# Patient Record
Sex: Female | Born: 1970
Health system: Southern US, Community
[De-identification: ages and names within clinical notes are randomized; demographics above are authoritative.]

## PROBLEM LIST (undated history)

## (undated) DIAGNOSIS — M1990 Unspecified osteoarthritis, unspecified site: Secondary | ICD-10-CM

## (undated) DIAGNOSIS — K589 Irritable bowel syndrome without diarrhea: Secondary | ICD-10-CM

## (undated) DIAGNOSIS — K760 Fatty (change of) liver, not elsewhere classified: Secondary | ICD-10-CM

## (undated) DIAGNOSIS — I253 Aneurysm of heart: Secondary | ICD-10-CM

## (undated) DIAGNOSIS — R7303 Prediabetes: Secondary | ICD-10-CM

## (undated) DIAGNOSIS — I10 Essential (primary) hypertension: Secondary | ICD-10-CM

## (undated) DIAGNOSIS — M2550 Pain in unspecified joint: Secondary | ICD-10-CM

## (undated) DIAGNOSIS — R51 Headache: Secondary | ICD-10-CM

## (undated) DIAGNOSIS — E559 Vitamin D deficiency, unspecified: Secondary | ICD-10-CM

## (undated) DIAGNOSIS — F329 Major depressive disorder, single episode, unspecified: Secondary | ICD-10-CM

## (undated) DIAGNOSIS — K829 Disease of gallbladder, unspecified: Secondary | ICD-10-CM

## (undated) DIAGNOSIS — F419 Anxiety disorder, unspecified: Secondary | ICD-10-CM

## (undated) DIAGNOSIS — G9332 Myalgic encephalomyelitis/chronic fatigue syndrome: Secondary | ICD-10-CM

## (undated) DIAGNOSIS — R748 Abnormal levels of other serum enzymes: Secondary | ICD-10-CM

## (undated) DIAGNOSIS — G43909 Migraine, unspecified, not intractable, without status migrainosus: Secondary | ICD-10-CM

## (undated) DIAGNOSIS — K219 Gastro-esophageal reflux disease without esophagitis: Secondary | ICD-10-CM

## (undated) DIAGNOSIS — G40909 Epilepsy, unspecified, not intractable, without status epilepticus: Secondary | ICD-10-CM

## (undated) DIAGNOSIS — E739 Lactose intolerance, unspecified: Secondary | ICD-10-CM

## (undated) DIAGNOSIS — N289 Disorder of kidney and ureter, unspecified: Secondary | ICD-10-CM

## (undated) DIAGNOSIS — R569 Unspecified convulsions: Secondary | ICD-10-CM

## (undated) DIAGNOSIS — F429 Obsessive-compulsive disorder, unspecified: Secondary | ICD-10-CM

## (undated) DIAGNOSIS — S83209A Unspecified tear of unspecified meniscus, current injury, unspecified knee, initial encounter: Secondary | ICD-10-CM

## (undated) DIAGNOSIS — R011 Cardiac murmur, unspecified: Secondary | ICD-10-CM

## (undated) DIAGNOSIS — Z8739 Personal history of other diseases of the musculoskeletal system and connective tissue: Secondary | ICD-10-CM

## (undated) DIAGNOSIS — F32A Depression, unspecified: Secondary | ICD-10-CM

## (undated) DIAGNOSIS — K5790 Diverticulosis of intestine, part unspecified, without perforation or abscess without bleeding: Secondary | ICD-10-CM

## (undated) DIAGNOSIS — R0602 Shortness of breath: Secondary | ICD-10-CM

## (undated) DIAGNOSIS — R1310 Dysphagia, unspecified: Secondary | ICD-10-CM

## (undated) DIAGNOSIS — R7301 Impaired fasting glucose: Secondary | ICD-10-CM

## (undated) DIAGNOSIS — M797 Fibromyalgia: Secondary | ICD-10-CM

## (undated) DIAGNOSIS — M549 Dorsalgia, unspecified: Secondary | ICD-10-CM

## (undated) DIAGNOSIS — K5900 Constipation, unspecified: Secondary | ICD-10-CM

## (undated) HISTORY — DX: Abnormal levels of other serum enzymes: R74.8

## (undated) HISTORY — DX: Fatty (change of) liver, not elsewhere classified: K76.0

## (undated) HISTORY — DX: Unspecified convulsions: R56.9

## (undated) HISTORY — DX: Anxiety disorder, unspecified: F41.9

## (undated) HISTORY — DX: Irritable bowel syndrome, unspecified: K58.9

## (undated) HISTORY — DX: Fibromyalgia: M79.7

## (undated) HISTORY — DX: Cardiac murmur, unspecified: R01.1

## (undated) HISTORY — PX: CHOLECYSTECTOMY: SHX55

## (undated) HISTORY — DX: Headache: R51

## (undated) HISTORY — DX: Lactose intolerance, unspecified: E73.9

## (undated) HISTORY — DX: Impaired fasting glucose: R73.01

## (undated) HISTORY — PX: TUBAL LIGATION: SHX77

## (undated) HISTORY — PX: ABDOMINAL HYSTERECTOMY: SHX81

## (undated) HISTORY — DX: Disorder of kidney and ureter, unspecified: N28.9

## (undated) HISTORY — DX: Aneurysm of heart: I25.3

## (undated) HISTORY — DX: Major depressive disorder, single episode, unspecified: F32.9

## (undated) HISTORY — DX: Myalgic encephalomyelitis/chronic fatigue syndrome: G93.32

## (undated) HISTORY — DX: Constipation, unspecified: K59.00

## (undated) HISTORY — DX: Shortness of breath: R06.02

## (undated) HISTORY — DX: Disease of gallbladder, unspecified: K82.9

## (undated) HISTORY — DX: Essential (primary) hypertension: I10

## (undated) HISTORY — DX: Depression, unspecified: F32.A

## (undated) HISTORY — DX: Pain in unspecified joint: M25.50

## (undated) HISTORY — PX: OTHER SURGICAL HISTORY: SHX169

## (undated) HISTORY — DX: Dorsalgia, unspecified: M54.9

## (undated) HISTORY — DX: Dysphagia, unspecified: R13.10

## (undated) HISTORY — DX: Prediabetes: R73.03

## (undated) HISTORY — DX: Migraine, unspecified, not intractable, without status migrainosus: G43.909

## (undated) HISTORY — DX: Unspecified osteoarthritis, unspecified site: M19.90

## (undated) HISTORY — DX: Unspecified tear of unspecified meniscus, current injury, unspecified knee, initial encounter: S83.209A

## (undated) HISTORY — DX: Gastro-esophageal reflux disease without esophagitis: K21.9

## (undated) HISTORY — DX: Personal history of other diseases of the musculoskeletal system and connective tissue: Z87.39

## (undated) HISTORY — DX: Obsessive-compulsive disorder, unspecified: F42.9

## (undated) HISTORY — DX: Vitamin D deficiency, unspecified: E55.9

## (undated) HISTORY — DX: Diverticulosis of intestine, part unspecified, without perforation or abscess without bleeding: K57.90

## (undated) HISTORY — DX: Epilepsy, unspecified, not intractable, without status epilepticus: G40.909

---

## 2000-01-11 ENCOUNTER — Encounter: Admission: RE | Admit: 2000-01-11 | Discharge: 2000-01-11 | Payer: Self-pay | Admitting: Surgery

## 2000-01-11 ENCOUNTER — Encounter: Payer: Self-pay | Admitting: Surgery

## 2000-02-05 ENCOUNTER — Encounter: Payer: Self-pay | Admitting: Surgery

## 2000-02-05 ENCOUNTER — Encounter (INDEPENDENT_AMBULATORY_CARE_PROVIDER_SITE_OTHER): Payer: Self-pay | Admitting: Specialist

## 2000-02-05 ENCOUNTER — Observation Stay (HOSPITAL_COMMUNITY): Admission: RE | Admit: 2000-02-05 | Discharge: 2000-02-06 | Payer: Self-pay | Admitting: Surgery

## 2001-07-27 ENCOUNTER — Other Ambulatory Visit: Admission: RE | Admit: 2001-07-27 | Discharge: 2001-07-27 | Payer: Self-pay | Admitting: Obstetrics and Gynecology

## 2002-01-29 ENCOUNTER — Ambulatory Visit (HOSPITAL_COMMUNITY): Admission: RE | Admit: 2002-01-29 | Discharge: 2002-01-29 | Payer: Self-pay | Admitting: Obstetrics and Gynecology

## 2002-02-12 ENCOUNTER — Inpatient Hospital Stay (HOSPITAL_COMMUNITY): Admission: RE | Admit: 2002-02-12 | Discharge: 2002-02-14 | Payer: Self-pay | Admitting: Obstetrics and Gynecology

## 2002-02-12 ENCOUNTER — Ambulatory Visit (HOSPITAL_COMMUNITY): Admission: AD | Admit: 2002-02-12 | Discharge: 2002-02-12 | Payer: Self-pay | Admitting: Obstetrics & Gynecology

## 2002-03-26 ENCOUNTER — Ambulatory Visit (HOSPITAL_COMMUNITY): Admission: RE | Admit: 2002-03-26 | Discharge: 2002-03-26 | Payer: Self-pay | Admitting: Obstetrics and Gynecology

## 2003-01-28 ENCOUNTER — Ambulatory Visit (HOSPITAL_COMMUNITY): Admission: RE | Admit: 2003-01-28 | Discharge: 2003-01-28 | Payer: Self-pay | Admitting: Family Medicine

## 2003-01-28 ENCOUNTER — Encounter: Payer: Self-pay | Admitting: Family Medicine

## 2004-06-17 ENCOUNTER — Inpatient Hospital Stay (HOSPITAL_COMMUNITY): Admission: RE | Admit: 2004-06-17 | Discharge: 2004-06-19 | Payer: Self-pay | Admitting: Obstetrics & Gynecology

## 2005-01-06 ENCOUNTER — Encounter: Admission: RE | Admit: 2005-01-06 | Discharge: 2005-01-06 | Payer: Self-pay | Admitting: Obstetrics & Gynecology

## 2005-01-20 ENCOUNTER — Encounter: Admission: RE | Admit: 2005-01-20 | Discharge: 2005-01-20 | Payer: Self-pay | Admitting: Obstetrics & Gynecology

## 2005-01-21 ENCOUNTER — Encounter: Admission: RE | Admit: 2005-01-21 | Discharge: 2005-01-21 | Payer: Self-pay | Admitting: Obstetrics & Gynecology

## 2005-02-17 ENCOUNTER — Encounter: Payer: Self-pay | Admitting: Obstetrics & Gynecology

## 2005-02-17 ENCOUNTER — Ambulatory Visit (HOSPITAL_COMMUNITY): Admission: RE | Admit: 2005-02-17 | Discharge: 2005-02-17 | Payer: Self-pay | Admitting: Obstetrics & Gynecology

## 2006-07-28 ENCOUNTER — Observation Stay (HOSPITAL_COMMUNITY): Admission: AD | Admit: 2006-07-28 | Discharge: 2006-07-29 | Payer: Self-pay | Admitting: Family Medicine

## 2008-11-05 ENCOUNTER — Encounter (INDEPENDENT_AMBULATORY_CARE_PROVIDER_SITE_OTHER): Payer: Self-pay | Admitting: *Deleted

## 2008-11-06 ENCOUNTER — Ambulatory Visit: Payer: Self-pay | Admitting: Genetic Counselor

## 2008-12-11 ENCOUNTER — Encounter (INDEPENDENT_AMBULATORY_CARE_PROVIDER_SITE_OTHER): Payer: Self-pay | Admitting: *Deleted

## 2009-08-05 ENCOUNTER — Ambulatory Visit (HOSPITAL_COMMUNITY): Admission: RE | Admit: 2009-08-05 | Discharge: 2009-08-06 | Payer: Self-pay | Admitting: Urology

## 2009-08-11 ENCOUNTER — Ambulatory Visit (HOSPITAL_COMMUNITY): Admission: RE | Admit: 2009-08-11 | Discharge: 2009-08-11 | Payer: Self-pay | Admitting: Urology

## 2009-09-26 ENCOUNTER — Ambulatory Visit (HOSPITAL_COMMUNITY): Admission: RE | Admit: 2009-09-26 | Discharge: 2009-09-27 | Payer: Self-pay | Admitting: Urology

## 2010-05-24 ENCOUNTER — Encounter: Payer: Self-pay | Admitting: Family Medicine

## 2010-05-24 ENCOUNTER — Encounter: Payer: Self-pay | Admitting: Obstetrics & Gynecology

## 2010-05-25 ENCOUNTER — Encounter: Payer: Self-pay | Admitting: Obstetrics & Gynecology

## 2010-06-04 NOTE — Letter (Signed)
Summary: Generic Letter, Intro to Referring  [facility]  [address]   [place], [place] [address]   Phone: [phone]  Fax: [phone]      December 11, 2008             RE: Theresa [name]   August 09, 1970                 [address]                 [place], [place]  [address]  Dear Appt Secretary,   This patient was a no show today.          Sincerely,    Elinor Parkinson  [facility] Ph: [phone]   Fax: [phone]

## 2010-06-04 NOTE — Letter (Signed)
Summary: Generic Letter, Intro to Referring  [facility]  [address]   [place], [place] [address]   Phone: [phone]  Fax: [phone]      November 05, 2008             RE: Theresa [name]   11-Jan-1971                 [address]                 [place], [place]  [address]  Dear Enid Derry,    This pt has been scheduled an appt for 12/11/2008 @ 9:00 w/ Dr. Jena Gauss.   Lmom with appt info and for a return call.         Sincerely,    Elinor Parkinson  [facility] Ph: [phone]   Fax: [phone]

## 2010-07-20 LAB — TYPE AND SCREEN
ABO/RH(D): O POS
Antibody Screen: NEGATIVE

## 2010-07-20 LAB — HEMOGLOBIN AND HEMATOCRIT, BLOOD
HCT: 32.1 % — ABNORMAL LOW (ref 36.0–46.0)
HCT: 33.8 % — ABNORMAL LOW (ref 36.0–46.0)
Hemoglobin: 10.6 g/dL — ABNORMAL LOW (ref 12.0–15.0)
Hemoglobin: 11.3 g/dL — ABNORMAL LOW (ref 12.0–15.0)

## 2010-07-20 LAB — PROTIME-INR
INR: 1.02 (ref 0.00–1.49)
Prothrombin Time: 13.3 seconds (ref 11.6–15.2)

## 2010-07-20 LAB — CBC
HCT: 36.9 % (ref 36.0–46.0)
Hemoglobin: 12.5 g/dL (ref 12.0–15.0)
MCHC: 33.9 g/dL (ref 30.0–36.0)
MCV: 93.5 fL (ref 78.0–100.0)
Platelets: 223 10*3/uL (ref 150–400)
RBC: 3.95 MIL/uL (ref 3.87–5.11)
RDW: 13.2 % (ref 11.5–15.5)
WBC: 5.4 10*3/uL (ref 4.0–10.5)

## 2010-07-20 LAB — APTT: aPTT: 33 s (ref 24–37)

## 2010-07-22 LAB — BASIC METABOLIC PANEL
BUN: 10 mg/dL (ref 6–23)
CO2: 26 mEq/L (ref 19–32)
Calcium: 8.5 mg/dL (ref 8.4–10.5)
Chloride: 110 mEq/L (ref 96–112)
Creatinine, Ser: 0.79 mg/dL (ref 0.4–1.2)
GFR calc Af Amer: >60 mL/min (ref >60)
GFR calc non Af Amer: >60 mL/min (ref >60)
Glucose, Bld: 112 mg/dL — ABNORMAL HIGH (ref 70–99)
Potassium: 4 mEq/L (ref 3.5–5.1)
Sodium: 140 mEq/L (ref 135–145)

## 2010-07-22 LAB — PROTIME-INR
INR: 1.01 (ref 0.00–1.49)
Prothrombin Time: 13.2 seconds (ref 11.6–15.2)

## 2010-07-22 LAB — APTT: aPTT: 33 seconds (ref 24–37)

## 2010-07-22 LAB — TYPE AND SCREEN
ABO/RH(D): O POS
Antibody Screen: NEGATIVE

## 2010-07-22 LAB — HEMOGLOBIN AND HEMATOCRIT, BLOOD
HCT: 32 % — ABNORMAL LOW (ref 36.0–46.0)
HCT: 32 % — ABNORMAL LOW (ref 36.0–46.0)
Hemoglobin: 10.6 g/dL — ABNORMAL LOW (ref 12.0–15.0)
Hemoglobin: 10.9 g/dL — ABNORMAL LOW (ref 12.0–15.0)

## 2010-07-22 LAB — CBC
HCT: 35.3 % — ABNORMAL LOW (ref 36.0–46.0)
Hemoglobin: 12 g/dL (ref 12.0–15.0)
MCHC: 33.9 g/dL (ref 30.0–36.0)
MCV: 93.1 fL (ref 78.0–100.0)
Platelets: 217 10*3/uL (ref 150–400)
RBC: 3.79 MIL/uL — ABNORMAL LOW (ref 3.87–5.11)
RDW: 13.1 % (ref 11.5–15.5)
WBC: 4.2 10*3/uL (ref 4.0–10.5)

## 2010-07-22 LAB — ABO/RH: ABO/RH(D): O POS

## 2010-07-22 LAB — GLUCOSE, CAPILLARY: Glucose-Capillary: 122 mg/dL — ABNORMAL HIGH (ref 70–99)

## 2010-09-11 ENCOUNTER — Other Ambulatory Visit: Payer: Self-pay | Admitting: Family Medicine

## 2010-09-11 DIAGNOSIS — Z09 Encounter for follow-up examination after completed treatment for conditions other than malignant neoplasm: Secondary | ICD-10-CM

## 2010-09-18 NOTE — Op Note (Signed)
Theresa [name], Theresa [name]               ACCOUNT NO.:  [id number]   MEDICAL RECORD NO.:  [id number]          PATIENT TYPE:  AMB   LOCATION:  DAY                           FACILITY:  [facility]   PHYSICIAN:  Lazaro Arms, M.D.   DATE OF BIRTH:  [birth year]   DATE OF PROCEDURE:  06/17/2004  DATE OF DISCHARGE:                                 OPERATIVE REPORT   PREOPERATIVE DIAGNOSES:  1.  Menometrorrhagia.  2.  Dysmenorrhea.  3.  Dyspareunia.  4.  Genuine stress urinary incontinence.   POSTOPERATIVE DIAGNOSES:  1.  Menometrorrhagia.  2.  Dysmenorrhea.  3.  Dyspareunia.  4.  Genuine stress urinary incontinence.   PROCEDURES:  1.  Vaginal hysterectomy with removal of both fallopian tubes.  2.  Retropubic midurethral sling placement.   SURGEON:  Lazaro Arms, M.D.   ANESTHESIA:  General endotracheal.   FINDINGS:  The patient had what appeared to be a normal uterus.  It was a  bit boggy, probably had adenomyosis.  The ovaries were both normal.  The  bladder was normal on cystoscopy.   DESCRIPTION OF OPERATION:  The patient was taken to the operating room and  placed in the supine position, where she underwent general endotracheal  anesthesia.  She was placed in dorsal lithotomy position in candy cane  stirrups.  Per patient's request, her clitoral ring was removed at the time  of surgery.  She then underwent routine prep and drape in the usual sterile  fashion for vaginal surgery.  A weighted speculum was placed.  The cervix  was grasped, a Foley catheter was placed for bladder drainage.  Marcaine  0.5% was injected circumferentially about the cervix.  An electrocautery  unit was used and an incision was made.  The anterior and posterior vagina  were pushed off the lower uterine segment without difficulty and the  anterior uterine segment and the posterior cul-de-sac was entered sharply  without difficulty.  The uterosacral ligaments were clamped, cut, and suture  ligated bilaterally.   The cardinal ligaments were clamped, cut, and suture  ligated bilaterally.  The anterior vagina was pushed further off the lower  uterine segment and the anterior cul-de-sac was entered.  The anterior  leaves of the broad ligament were plicated to the posterior leaves, and the  uterine vessels were clamped, cut, and suture ligated.  Serial pedicles were  then taken up the fundus, each pedicle being clamped, cut, and suture  ligated.  The utero-ovarian ligaments were crossclamped, double suture  ligated.  The ovaries were normal, and they were maintained.  The fallopian  tubes were removed, where they were clamped, cut, and single suture ligated  with good hemostasis.  The pelvis was irrigated vigorously, all pedicles  found to be hemostatic.  The anterior vagina, anterior peritoneum, posterior  peritoneum, and posterior vagina were closed anteriorly to posteriorly in  interrupted fashion from side to side for vaginal closure.  A McCall  culdoplasty suture was placed for additional vaginal support.  The anterior  vagina was then grasped with the bladder neck out to the end of  the urethra.  Marcaine 0.5% with 1:200,000 epinephrine was injected as a local anesthetic  and also for plane development and hemostasis.  Incision was made.  The  vagina was dissected off the urethra down to the bladder neck and also back  to the pubis.  Two incisions were made on either side of the midline 2 cm  from the midline at the pubis.  The Pelvilace trocar system was then passed  on either side without difficulty.  Cystoscopy was performed and revealed  that there was no perforation of the bladder with either side.  The  Pelvilace system was then pulled back through and adjusted so that it was  not at the level of the bladder neck, it was midurethral, and it was not at  rest, resting on the urethra at all.  It was literally forming a hammock but  not impinging the urethra.  There was good hemostasis, and it was  cut at the  skin level.  The incisions were closed with Dermabond.  The vagina was  closed with interrupted 0 Vicryl sutures.  There was good hemostasis.  The  vagina was repacked and, per the patient request, her clitoral ring was  replaced without difficulty.  The patient was awakened from anesthesia,  taken to the recovery room in good, stable condition.  All counts correct.  She experienced 200 mL of blood loss.  She received 1 g of Ancef  prophylactically.  All specimens went to the lab.      LHE/MEDQ  D:  06/17/2004  T:  06/17/2004  Job:  [id number]

## 2010-09-18 NOTE — H&P (Signed)
NAME:  Theresa [name], Theresa [name]                         ACCOUNT NO.:  [id number]   MEDICAL RECORD NO.:  [id number]                   PATIENT TYPE:  INP   LOCATION:  A416                                 FACILITY:  [facility]   PHYSICIAN:  Lazaro Arms, M.D.                DATE OF BIRTH:  Sep 05, 1970   DATE OF ADMISSION:  02/12/2002  DATE OF DISCHARGE:                                HISTORY & PHYSICAL   HISTORY OF PRESENT ILLNESS:  The patient is a 40 year old white female,  gravida 2, para 1, aborta 0, with an estimated date of delivery of February 19, 2002, currently 39 weeks' gestation.  The patient had a small fundal  height and underwent a sonogram today which was 2678 g, 5.9 pounds, which  places her at the 10th percentile for this gestation.  Amniotic fluid was  good.  She has a history of a gross retarded child and the patient is also  really concerned because she had a nuchal cord last pregnancy.  She has also  been complaining of diminished fetal movement.  We sent her over for an MST  and that was reactive.  Her cervix is very unfavorable.  It is long, thick,  and closed.  As a result she is admitted for cervical ripening with Cervidil  followed by Pitocin induction tomorrow morning.   PAST MEDICAL HISTORY:  Reflux.   PAST SURGICAL HISTORY:  Gallbladder.   PAST OB HISTORY:  She had a vaginal delivery in 1997, 5 pounds 14 ounces, at  41 weeks' gestation.   ALLERGIES:  None.   MEDICATIONS:  1. Prenatal vitamins.  2. Zofran.  3. Prevacid.   LABORATORY DATA:  Blood type is O positive.  Antibody screen is negative.  Serologies nonreactive.  Rubella is immune.  Hepatitis B surface antigen is  negative.  HIV is nonreactive.  Her Pap was normal.  GC and Chlamydia were  normal.  AFT was normal.  Her Glucola was 133, but she had a normal three  hour GTT.  She has also been anemic this pregnancy and her H&H was 9 and 27,  and she has had a couple UTIs and has been treated with  Macrobid.   PHYSICAL EXAMINATION:  HEENT:  Unremarkable.  NECK:  Thyroid is normal.  LUNGS:  Clear.  HEART:  Regular rhythm, no murmur, rubs, or gallop.  BREASTS:  Deferred.  ABDOMEN:  Fundal height is 36 cm.  PELVIC:  Cervix long, thick, and closed.  EXTREMITIES:  Warm with trace edema.  NEUROLOGIC:  Exam is grossly intact.   IMPRESSION:  1. Intrauterine pregnancy at 59 weeks' gestation.  2. Small for gestational age at 23th percentile.  3. History of small for gestational age.   PLAN:  The patient is admitted for cervical ripening followed by Pitocin  induction.  She understands the risks, benefits, indications, and  alternatives.  Will proceed.  Lazaro Arms, M.D.    Theresa [name]  D:  02/12/2002  T:  02/12/2002  Job:  [id number]

## 2010-09-18 NOTE — H&P (Signed)
   NAMEPATIENCE, NUZZO NO.:  [id number]   MEDICAL RECORD NO.:  [id number]                  PATIENT TYPE:   LOCATION:                                       FACILITY:   PHYSICIAN:  Tilda Burrow, M.D.              DATE OF BIRTH:   DATE OF ADMISSION:  03/22/2002  DATE OF DISCHARGE:                                HISTORY & PHYSICAL   ADMISSION DIAGNOSIS:  Desire for elective sterilization.   HISTORY OF PRESENT ILLNESS:  This 40 year old female, gravida 2, para 2, now  status post vaginal delivery on 02/13/02, was admitted for elective  permanent sterilization five weeks after recent vaginal delivery.  The  patient has remained abstinent since delivery, has had Krane's instruction  booklet reviewed with specific review of technical aspects of Falope ring  application, with complications, alternatives, and technical aspects of the  procedure reviewed using this Krane's booklet as a visual guide.  Questions  were encouraged and answered to the patient's satisfaction.  Failure rate of  one in 100 is specifically reviewed with the patient.   PAST MEDICAL HISTORY:  Positive for reflux (GERD).   PAST SURGICAL HISTORY:  Laparoscopic cholecystectomy.   ALLERGIES:  None.   MEDICATIONS:  Prevacid 10-20 mg q.d.   SOCIAL HISTORY:  Married, Lawyer.   FAMILY HISTORY:  Positive for ovarian and lung cancer, diabetes,  hypertension.   REVIEW OF SYSTEMS:  Prior anesthesia with gallbladder surgery is listed as  uncomplicated.   PHYSICAL EXAMINATION:  VITAL SIGNS:  Height 5 feet 6 inches, weight 172,  blood pressure 120/75.  Urinalysis negative.  GENERAL:  A healthy Caucasian female, alert, active, oriented x3.  HEENT:  Pupils equal, round, and reactive.  NECK:  Supple.  CHEST:  Clear to auscultation.  Heart and lung exam unremarkable.  ABDOMEN:  Nontender, well-healed laparoscopic cholecystectomy scars.  PELVIC:  External genitalia normal  female.  Vaginal exam:  Physiologic  secretions.  Cervix:  Nonpurulent, normal size, shape, and contour.  Midline  uterus, mobile, anterior, well-supported.  Adnexa negative for masses.    ASSESSMENT:  Desire for elective sterilization.   PLAN:  Laparoscopic tubal sterilization with Falope rings 03/22/02 at 7:30  a.m. at [facility]                                               Tilda Burrow, M.D.    JVF/MEDQ  D:  03/14/2002  T:  03/14/2002  Job:  [id number]

## 2010-09-18 NOTE — Group Therapy Note (Signed)
[name], Theresa [name]               ACCOUNT NO.:  [id number]   MEDICAL RECORD NO.:  [id number]          PATIENT TYPE:  OBV   LOCATION:  A203                          FACILITY:  [facility]   PHYSICIAN:  Scott A. Gerda Diss, MD    DATE OF BIRTH:  11-22-1970   DATE OF PROCEDURE:  DATE OF DISCHARGE:                                 PROGRESS NOTE   She threw up this morning. Peed once. No other vomiting or diarrhea. No  fevers. Lungs clear. Heart is regular. Abdomen soft. A/P polynephritis  with gastroenteritis symptoms doing better, but still needs to stay  until this evening. Possibility may have to stay beyond that. Wait and  see.      Scott A. Gerda Diss, MD  Electronically Signed     SAL/MEDQ  D:  07/29/2006  T:  07/29/2006  Job:  [id number]

## 2010-09-18 NOTE — Op Note (Signed)
   NAME:  Theresa [name], Theresa [name]                         ACCOUNT NO.:  [id number]   MEDICAL RECORD NO.:  [id number]                   PATIENT TYPE:  AMB   LOCATION:  DAY                                  FACILITY:  [facility]   PHYSICIAN:  Tilda Burrow, M.D.              DATE OF BIRTH:  1970/12/10   DATE OF PROCEDURE:  DATE OF DISCHARGE:                                 OPERATIVE REPORT   PREOPERATIVE DIAGNOSES:  Elective sterilization.   POSTOPERATIVE DIAGNOSES:  Elective sterilization.   PROCEDURE:  Laparoscopic tubal sterilization with Falope ring.   SURGEON:  Tilda Burrow, M.D.   ASSISTANT:  _____________ CST   ANESTHESIA:  Orson Gear, CRNA.   COMPLICATIONS:  None.   FINDINGS:  Extremely mobile pelvic structures, relaxed perineal body. If  gynecologic surgery is ever anticipated she would be an excellent candidate  for a vaginal hysterectomy and/or posterior repair. There are no  intraabdominal adhesions noted.   DESCRIPTION OF PROCEDURE:  The patient was taken to the operating room,  prepped and draped in the usual fashion for a combined abdominal and vaginal  procedure. In and out catheterization removed 400 cc of urine, Hulka  tenaculum was attached to the cervix and attention directed to the abdomen  where an infraumbilical vertical 1 cm skin incision was performed. The  Veress needle was introduced, pneumoperitoneum achieved under 8 mm pressure,  a 5 mm laparoscopic trocar introduced and visualization of the pelvic  structures easily performed. The uterus was very mobile and could be  manipulated from descending to within 2 cm of the introitus to very high in  the pelvis. Ovaries were grossly normal without adhesions. There was no  evidence of endometriosis. The bladder was normal without adhesions.   Attention was directed to the suprapubic area where an 8 mm trocar was  introduced, all my attention direct to the left tube which was grasped with  a Falope ring and  pliers, the ring applied, infiltration of the mesosalpinx  and the incarcerated loops of tube with dilute Marcaine solution 0.25%. We  then proceeded to do an identical procedure on the opposite side.   Instilling 100 cc of saline was followed by deflation of the abdomen,  subcuticular 4-0 Dexon closure of skin incisions and removal of the Hulka  tenaculum. The patient tolerated the procedure well and went to the recovery  room after slow tendency to wake up.                                                Tilda Burrow, M.D.    JVF/MEDQ  D:  03/26/2002  T:  03/26/2002  Job:  [id number]

## 2010-09-18 NOTE — Op Note (Signed)
   NAME:  Theresa [name], Theresa [name]                         ACCOUNT NO.:  [id number]   MEDICAL RECORD NO.:  [id number]                   PATIENT TYPE:  INP   LOCATION:  A416                                 FACILITY:  [facility]   PHYSICIAN:  Tilda Burrow, M.D.              DATE OF BIRTH:  05/17/1970   DATE OF PROCEDURE:  02/13/2002  DATE OF DISCHARGE:                                  PROCEDURE NOTE   ONSET OF LABOR:  February 13, 2002 at 12 a.m.   DATE OF DELIVERY:  February 13, 2002 at 4:10 a.m.   LENGTH OF FIRST STAGE LABOR:  Three hours and 45 minutes.   LENGTH OF SECOND STAGE LABOR:  Twenty-five minutes.   LENGTH OF THIRD STAGE LABOR:  Five minutes.   DELIVERY NOTE:  The patient had a normal spontaneous vaginal delivery of a  viable female infant weighing 5 pounds 8 ounces, with Apgars of 9/9.  Upon  delivery of head, spontaneous delivery of infant without any complications.  Infant had a strong cry, good tone, pinked up very well.  Cord was clamped  and cut and infant dried and placed on the mom's abdomen in good condition.  A 1 degree perineal laceration was noted which was infiltrated with some 1%  lidocaine, 10 cc, and two interrupted sutures to close of 2-0 Vicryl.  Third  stage of labor was actively managed with 20 units of Pitocin, 1000 cc of D-5  LR.  Placenta was delivered spontaneously with membranes intact.  Three-  vessel cord was noted.  Estimated blood loss was approximately 300 cc.  Infant and mother were stabilized, tolerated procedure well and transferred  out to the postpartum unit in stable condition.     Zerita Boers, Reita Cliche, M.D.    DL/MEDQ  D:  16/02/9603  T:  02/13/2002  Job:  [id number]   cc:   [facility]

## 2010-09-18 NOTE — Op Note (Signed)
[facility]  Patient:    Theresa [name], Theresa [name]                        MRN: [id number] Proc. Date: 02/05/00 Attending:  [place]. Daphine Deutscher, M.D. CC:         Lilyan Punt, M.D., [place], [place].   Operative Report  PREOPERATIVE DIAGNOSES:  Chronic cholecystitis with gallstones.  POSTOPERATIVE DIAGNOSES:  Chronic cholecystitis with gallstones.  PROCEDURE:  Laparoscopic cholecystectomy with intraoperative cholangiogram.  SURGEON:  Luretha Murphy, M.D.  ASSISTANT:  Jaclynn Guarneri, M.D.  ANESTHESIA:  General endotracheal.  DESCRIPTION OF PROCEDURE:  Ms. Ozturk was taken to OR 4 and given general anesthesia. The abdomen was prepped with Betadine and draped sterilely. A longitudinal incision was made down into her umbilicus through which I found a small little hernia and then opened that up to create a defect which I subsequently placed a pursestring suture around for the Hasson cannula. The Hasson was introduced without difficulty and the abdomen was insufflated. With insufflation, I put 3 trocars in the upper abdomen and grasped the gallbladder and elevated it. There were adhesions to the fundus and infundibulum of the gallbladder which were easily stripped away. I dissected free Calots triangle and clipped proximally on the cystic duct and also will put 1 clip on the cystic artery. I did a dynamic cholangiogram with the C-arm and this showed good intrahepatic filling and flow up into the liver as well as prompt flow into the duodenum. No filling defects were noted.  I then triple clipped the cystic duct and divided it and triple clipped the cystic artery and divided it and removed the gallbladder without difficulty from the gallbladder bed using the hook coagulator. Once detached, I went back and inspected the area after I had subjected it to zero pressure and no bleeding or bile leaks were seen. The gallbladder was placed in a bag and brought out through the  umbilicus. The umbilical port was tied down and under direct vision securely closed. The port sites were not bleeding. The irrigation that had been used was withdrawn. The port sites were injected with Marcaine and the abdomen was deflated. The wounds were then closed with 4-0 Vicryl with benzoin and Steri-Strips. The patient tolerated the procedure well and was taken to the recovery room in satisfactory condition. DD:  02/05/00 TD:  02/05/00 Job: [id number]

## 2010-09-18 NOTE — Op Note (Signed)
Theresa [name], Theresa [name]               ACCOUNT NO.:  [id number]   MEDICAL RECORD NO.:  [id number]          PATIENT TYPE:  AMB   LOCATION:  DAY                           FACILITY:  [facility]   PHYSICIAN:  Lazaro Arms, M.D.   DATE OF BIRTH:  04-16-1971   DATE OF PROCEDURE:  02/17/2005  DATE OF DISCHARGE:                                 OPERATIVE REPORT   PREOPERATIVE DIAGNOSES:  1.  Left lower quadrant pain.  2.  Left sided dyspareunia.   POSTOPERATIVE DIAGNOSES:  1.  Left lower quadrant pain.  2.  Left sided dyspareunia.   OPERATION PERFORMED:  Laparoscopic left salpingo-oophorectomy.   SURGEON:  Lazaro Arms, M.D.   ANESTHESIA:  General endotracheal.   FINDINGS:  The patient had adherent left ovary to the left pelvic side wall  and filmy adhesions to the sigmoid epiploicae.  The right ovary and tube  were completely normal.  There was a small adhesion to a epiploica of the  appendix which was taken down.  The rest of the peritoneal cavity was  normal. There was no endometriosis, adhesive disease or evidence of  infection.   DESCRIPTION OF PROCEDURE:  The patient was taken to the operating room and  placed in supine position where she underwent general endotracheal  anesthesia.  She was placed in dorsal lithotomy position, prepped and draped  in the usual sterile fashion for laparoscopic surgery.  Incision was made in  the umbilicus.  A disposable Veress needle was placed in the peritoneal  cavity in one pass without difficulty.  The peritoneal cavity was  insufflated to 15.  The patient was placed in Trendelenburg position.  Incision was made two fingerbreadths above the pubis in the midline.  A 5 mm  nonbladed trocar was placed.  The incision was then made in the left lower  quadrant.  Another 5 mm trocar was placed under direct visualization with  video laparoscope.  The left ovary was grasped.  Harmonic scalpel was used  and the infundibulopelvic ligament was taken down  without difficulty.  The  filmy adhesions to the epiploicae were also taken down.  There was good  hemostasis.  Pelvis irrigated vigorously and again hemostasis was confirmed.  The ovary was removed with EndoCatch bag without difficulty.  The umbilical  fascia was closed using 0 Vicryl, subcutaneous tissue was made hemostatic  and closed in the umbilicus  using 3-0 Vicryl.  All three skin incisions were closed using Dermabond.  The patient was awakened from anesthesia, taken to recovery room in good  stable condition.  She received Ancef and Toradol prophylactically.  The  specimen will go to pathology for routine evaluation.      Lazaro Arms, M.D.  Electronically Signed     LHE/MEDQ  D:  02/17/2005  T:  02/17/2005  Job:  [id number]

## 2010-09-18 NOTE — H&P (Signed)
NAMEICIE, KUZNICKI               ACCOUNT NO.:  [id number]   MEDICAL RECORD NO.:  [id number]          PATIENT TYPE:  AMB   LOCATION:  DAY                           FACILITY:  [facility]   PHYSICIAN:  Lazaro Arms, M.D.   DATE OF BIRTH:  1970-11-08   DATE OF ADMISSION:  DATE OF DISCHARGE:  LH                                HISTORY & PHYSICAL   HISTORY OF PRESENT ILLNESS:  Reagen is a 40 year old white female, gravida  3, para 2, abortus 1, status post a tubal ligation in 2003, who suffered  with a heavy, prolonged vaginal bleeding with her periods for some time.  She also has painful intercourse in the midline.  Her periods are also quite  painful.  She has large clots.  We have had to use Megace with her for  suppression, and she did not do well with that. We also offered possible  Jearld Adjutant IUD, and she was not comfortable with that as a mode of management.  As a result, we are discussing the options.  Ablation would certainly not  treat her significant dyspareunia which she has every time with intercourse,  and she also has genuine stress urinary incontinence. She does not have any  urge symptoms, no urge incontinence.  She also has urethrocele. As a result,  we will proceed with a vaginal hysterectomy with a retropubic mid urethral  sling placement.   PAST MEDICAL HISTORY:  Gastroesophageal reflux,   PAST SURGICAL HISTORY:  1.  Laparoscopic cholecystectomy.  2.  Laparoscopic tubal ligation.   PAST OBSTETRICAL HISTORY:  Two vaginal deliveries.   ALLERGIES:  None.   CURRENT MEDICATIONS:  Tylox for her pelvic pain.  She is no longer taking  the Megace.   REVIEW OF SYSTEMS:  Otherwise negative.   PHYSICAL EXAMINATION:  VITAL SIGNS:  Weight 192 pounds.  Blood pressure  110/80.  HEENT:  Unremarkable.  NECK:  Thyroid is normal.  LUNGS:  Clear.  HEART:  Regular rate and rhythm without murmur, regurgitation, or gallop.  BREASTS:  Without mass, discharge, skin changes.  There is no  axillary  adenopathy.  No nipple changes.  ABDOMEN: Benign without hepatosplenomegaly or masses.  PELVIC:  She has normal external genitalia.  The vagina is pink, moist, no  discharge.  Cervix is parous with no lesions.  The uterus is mid plane,  tender to palpation.  Adnexa negative.  She does have a urethrocele and mild  rectocele but certainly not worthy of repair.  EXTREMITIES:  Warm with no edema.  NEUROLOGIC:  Grossly intact.   IMPRESSION:  1.  Menometrorrhagia.  2.  Dysmenorrhea.  3.  Dyspareunia.  4.  Genuine stress urinary incontinence with urethrocele.   PLAN:  The patient is admitted for vaginal hysterectomy and retropubic mid  urethral sling. Vonda and I talked over the options in detail.  We also  discussed the fact that she may need to have a catheterization for some time  after surgery.  Because of her age, I will probably put her sling maybe a  bit tighter than others.  We will  certainly try to walk the line of good  anatomical repair, keeping her continent, but with her age, we want to try  to make sure it lasts for a lifetime.  The patient understands this and  agrees with this management plan, and we will proceed.      LHE/MEDQ  D:  06/16/2004  T:  06/16/2004  Job:  [id number]

## 2010-09-18 NOTE — H&P (Signed)
Theresa [name], [name]               ACCOUNT NO.:  [id number]   MEDICAL RECORD NO.:  [id number]          PATIENT TYPE:  OBV   LOCATION:  A203                          FACILITY:  [facility]   PHYSICIAN:  Scott A. Gerda Diss, MD    DATE OF BIRTH:  1971/03/26   DATE OF ADMISSION:  07/28/2006  DATE OF DISCHARGE:  LH                              HISTORY & PHYSICAL   CHIEF COMPLAINT:  Vomiting, abdominal pain.   HISTORY OF PRESENT ILLNESS:  This is a 40 year old white female who  presents with a four day history of a little bit of cough, some fever,  painful urination, vomiting, lower back pain, and flank pain. She was  seen originally on March 26. At that time her temperature was 100.9. Her  weight was 197. Orthostatics were done and did have a drop in blood  pressure, but heart rate did not go up. The patient did not want to go  into the hospital at that time and thought it was reasonable to try to  treat her as an outpatient. A shot of Rocephin was given along with  Phenergan and Stadol, and she was allowed to go home. On the 27th she  follows up and dropped about 3.5 pounds of weight. Temperature was  normal. Slight change with orthostatics. In her urine showed 3+ ketones.  On physical exam yesterday she had flank pain and discomfort so  therefore it is felt that the patient should go ahead and be admitted  into observation. PMH, impaired fasting glucose, history of kidney  infection, and also hysterectomy in the past.   FAMILY HISTORY:  Breast cancer, hypertension, diabetes.   SOCIAL HISTORY:  Married. Has a child. Does not smoke or drink.   ALLERGIES:  None.   REVIEW OF SYSTEMS:  See per above.   PHYSICAL EXAMINATION:  GEN:  NAD.  TMs __________.  MM moist.  NECK:  Supple.  LUNGS:  Clear.  HEART:  Regular.  ABDOMEN:  Soft. Patient nauseated. Flank, mild tenderness on the right  side.  EXTREMITIES:  No edema.  SKIN:  Warm, dry.   LABORATORY DATA:  White count 11.2, neutrophils 79,  sodium 135,  potassium 4, glucose 115, BUN 9, creatinine 0.86. Urine in the office  showed 3+ ketones.   Patient's blood pressure went from 92/54 laying to 82/48 standing. The  patient felt clinically dizzy with S/P pyelonephritis with some  gastroenteritis and low blood pressure. I feel the patient should go  ahead and be treated with IV fluids, IV Rocephin, and monitor closely.  Advance diet gradually. Possibility the patient may have to stay longer  than 24 hours, but hold on this until further aspects.      Scott A. Gerda Diss, MD  Electronically Signed     SAL/MEDQ  D:  07/29/2006  T:  07/29/2006  Job:  [id number]

## 2010-09-23 ENCOUNTER — Ambulatory Visit (HOSPITAL_COMMUNITY)
Admission: RE | Admit: 2010-09-23 | Discharge: 2010-09-23 | Disposition: A | Discharge status: Home / Self Care | Payer: 59 | Source: Ambulatory Visit | Attending: Family Medicine | Admitting: Family Medicine

## 2010-09-23 DIAGNOSIS — N6459 Other signs and symptoms in breast: Secondary | ICD-10-CM | POA: Insufficient documentation

## 2010-09-23 DIAGNOSIS — Z09 Encounter for follow-up examination after completed treatment for conditions other than malignant neoplasm: Secondary | ICD-10-CM

## 2011-09-09 ENCOUNTER — Ambulatory Visit (HOSPITAL_COMMUNITY)
Admission: RE | Admit: 2011-09-09 | Discharge: 2011-09-09 | Disposition: A | Discharge status: Home / Self Care | Payer: 59 | Source: Ambulatory Visit | Attending: Family Medicine | Admitting: Family Medicine

## 2011-09-09 ENCOUNTER — Other Ambulatory Visit: Payer: Self-pay | Admitting: Family Medicine

## 2011-09-09 DIAGNOSIS — R52 Pain, unspecified: Secondary | ICD-10-CM | POA: Insufficient documentation

## 2011-09-09 DIAGNOSIS — M7730 Calcaneal spur, unspecified foot: Secondary | ICD-10-CM | POA: Insufficient documentation

## 2011-09-09 DIAGNOSIS — X58XXXA Exposure to other specified factors, initial encounter: Secondary | ICD-10-CM | POA: Insufficient documentation

## 2011-09-20 ENCOUNTER — Telehealth: Payer: Self-pay | Admitting: *Deleted

## 2011-09-20 NOTE — Telephone Encounter (Signed)
Confirmed 10/04/11 genetic appt w/ pt.  Took paperwork to Clydie Braun.

## 2011-10-04 ENCOUNTER — Ambulatory Visit (HOSPITAL_BASED_OUTPATIENT_CLINIC_OR_DEPARTMENT_OTHER): Payer: 59 | Admitting: Genetic Counselor

## 2011-10-04 ENCOUNTER — Other Ambulatory Visit: Payer: 59 | Admitting: Lab

## 2011-10-04 DIAGNOSIS — Z803 Family history of malignant neoplasm of breast: Secondary | ICD-10-CM

## 2011-10-04 DIAGNOSIS — Z8041 Family history of malignant neoplasm of ovary: Secondary | ICD-10-CM

## 2011-10-04 NOTE — Progress Notes (Addendum)
Dr.  Lilyan Punt requested a consultation for genetic counseling and risk assessment for Theresa [name], a 41 y.o. female, for discussion of her family history of breast and ovarian cancer. She presents to clinic today to discuss the possibility of a genetic predisposition to cancer, and to further clarify her risks, as well as her family members' risks for cancer.   HISTORY OF PRESENT ILLNESS: Theresa [name] is a 41 y.o. female with no personal history of cancer.    No past medical history on file.  No past surgical history on file.  History  Substance Use Topics  . Smoking status: Not on file  . Smokeless tobacco: Not on file  . Alcohol Use: Not on file    REPRODUCTIVE HISTORY AND PERSONAL RISK ASSESSMENT FACTORS: Menarche was at age 13.   Menopausal Uterus Intact: No; removed because of adneomyosis at age 18 Ovaries Intact: One ovary intact G2P2A0 , first live birth at age 16  She has not previously undergone treatment for infertility.   OCP use for about 9 years   She has used HRT in the past.    FAMILY HISTORY:  We obtained a detailed, 4-generation family history.  Significant diagnoses are listed below: The patient's mother was diagnosed with breast cancer at age 62, ovarian cancer at age 57, and has a recurrance of breast cancer at age 51.  The patient's maternal cousin had breast cancer in her 63s.  The patient also has two paternal cousins with breast cancer.  The daughter of her paternal uncle was diagnosed with breast cancer around age 21 and the daughter of a paternal aunt had breast cancer around age 65.  Another paternal aunt had an unknown cancer.  Patient's maternal ancestors are of [place] and [place] descent, and paternal ancestors are of [place], [place] and [place] descent. There is no reported Ashkenazi Jewish ancestry. There is no  known consanguinity.  GENETIC COUNSELING RISK ASSESSMENT, DISCUSSION, AND SUGGESTED FOLLOW UP: We reviewed the natural history and  genetic etiology of sporadic, familial and hereditary cancer syndromes.  About 5-10% of breast cancer is hereditary.  Of this, about 85% is the result of a BRCA1 or BRCA2 mutation.  We reviewed the red flags of hereditary cancer syndromes and the dominant inheritance patterns.  As the patient has not had breast cancer, we discussed that someone in the family with cancer would be a more informative person to test.  There are communication issues going on in her family and at this time there is no other individual to do testing.  We discussed that other testing companies perform testing on other genes associated with hereditary breast cancer and put them in a panel where they are all run at the same time.  Some of the genes are well studies, other are not as well known.  The patient declined this testing at this time.  The patient's family history is suggestive of the following possible diagnosis: hereditary breast cancer  We discussed that identification of a hereditary cancer syndrome may help her care providers tailor the patients medical management. If a mutation indicating a hereditary breast cancer is detected in this case, the [employer] recommendations would include increased cancer surveillance and possibly prophylactic sugery. If a mutation is detected, the patient will be referred back to the referring provider and to any additional appropriate care providers to discuss the relevant options.   If a mutation is not found in the patient,cancer surveillance options would be  discussed for the patient according to the appropriate standard National Comprehensive Cancer Network and American Cancer Society guidelines, with consideration of their personal and family history risk factors. In this case, the patient will be referred back to their care providers for discussions of management.   In order to estimate her chance of having a BRCA1 or BRCA2 mutation, we used statistical  models (Penn II) and laboratory data that take into account her personal medical history, family history and ancestry.  Because each model is different, there can be a lot of variability in the risks they give.  Therefore, these numbers must be considered a rough range and not a precise risk of having a BRCA1 or BRCA2 mutation.  These models estimate that she has approximately a 8-20% chance of having a mutation, with the 8% risk coming from her paternal family history and the 20% risk coming from her maternal family history. Based on this assessment of her family and personal history, genetic testing is recommended.  Based on this result, Ms. Potenza could be seen through the high risk breast clinic for screening.  After considering the risks, benefits, and limitations, the patient provided informed consent for  the following testing:  BRCAnalysis through [facility].   Per the patient's request, we will contact her by telephone to discuss these results. A follow up genetic counseling visit will be scheduled if indicated.  The patient was seen for a total of 45 minutes, greater than 50% of which was spent face-to-face counseling.  This plan is being carried out per Dr. Fletcher Anon recommendations.  This note will also be sent to the referring provider via the electronic medical record. The patient will be supplied with a summary of this genetic counseling discussion as well as educational information on the discussed hereditary cancer syndromes following the conclusion of their visit.   Patient was discussed with Dr. Marikay Alar Magrinat in Dr. Feliz Beam absence.  EDUCATIONAL INFORMATION SUPPLIED TO PATIENT AT ENCOUNTER:  Hereditary breast and ovarian cancer syndrome brochure   _______________________________________________________________________ For Office Staff:  Number of people involved in session: 2 Was an Intern/ student involved with case: not applicable

## 2011-12-15 ENCOUNTER — Telehealth: Payer: Self-pay | Admitting: Genetic Counselor

## 2011-12-15 NOTE — Telephone Encounter (Signed)
Revealed negative BRCA and BART.  Discussed BRCAPlus testing.  She is interested in pursuing that and will come in for a blood draw on Monday at 11:30.

## 2011-12-20 ENCOUNTER — Other Ambulatory Visit: Payer: 59 | Admitting: Lab

## 2011-12-21 ENCOUNTER — Encounter: Payer: Self-pay | Admitting: Genetic Counselor

## 2012-01-06 ENCOUNTER — Telehealth: Payer: Self-pay | Admitting: Genetic Counselor

## 2012-01-06 NOTE — Telephone Encounter (Signed)
Revealed negative BRCAPlus testing.  Discussed risk for breast cancer based on family history is 21.8%.  Will refer to high risk breast clinic.

## 2012-01-07 ENCOUNTER — Encounter: Payer: Self-pay | Admitting: Genetic Counselor

## 2012-01-20 ENCOUNTER — Encounter: Payer: Self-pay | Admitting: Genetic Counselor

## 2012-03-07 ENCOUNTER — Encounter: Payer: 59 | Admitting: Oncology

## 2012-03-18 IMAGING — CR DG VCUG
1 series · 1 of 1 positions shown · non-contrast
Comparison: None.

CLINICAL DATA: Urethral injury on 08/05/2009.  Assess urethra for
leak.

VOIDING CYSTOURETHROGRAM
TECHNIQUE: After catheterization of the urinary bladder following
sterile technique the bladder was filled with 300 ml Cysto-hypaque
30% by drip infusion.  Serial spot images were obtained during
bladder filling and voiding.
Fluoroscopy Time: 3.9 minutes

[view not recorded]
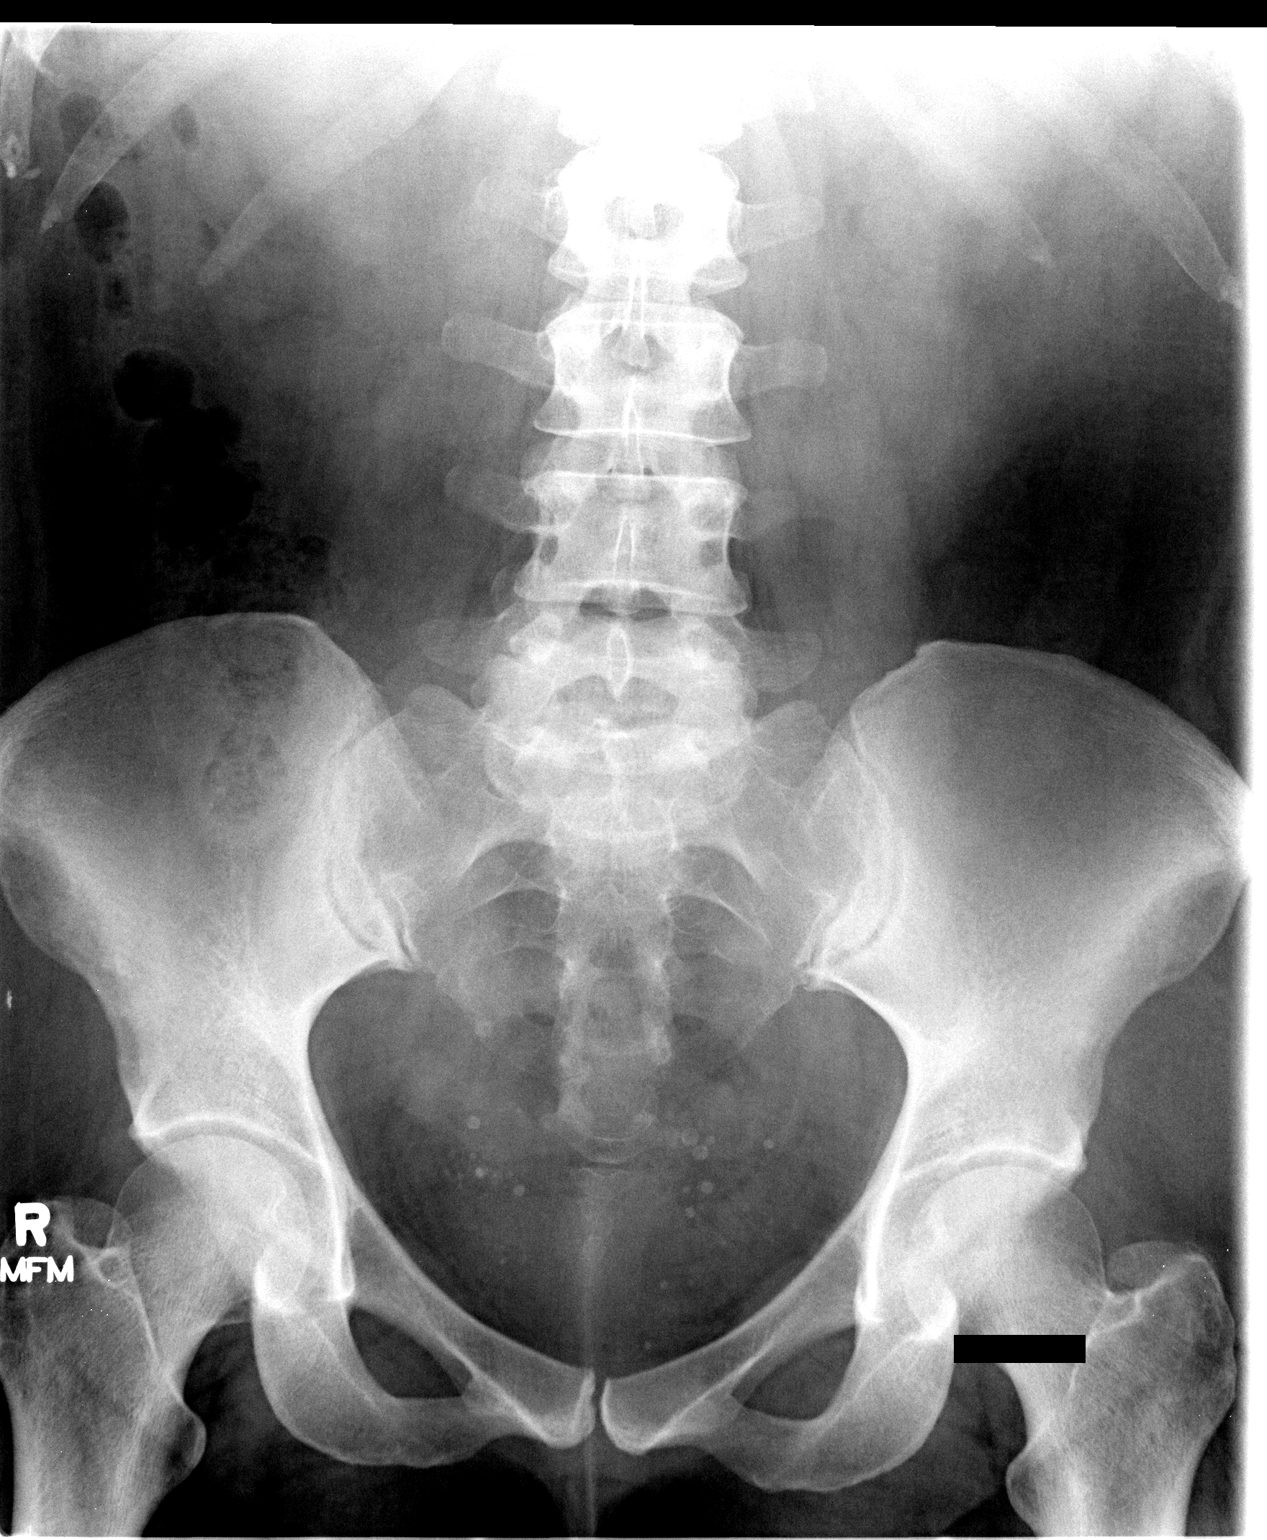

[1 of 1 positions shown; findings below may reference images not displayed]

FINDINGS: Specifically, the urethra is intact.  No leakage of
contrast media.  The bladder is unremarkable.
IMPRESSION: Intact urethra demonstrated.

## 2012-04-12 ENCOUNTER — Ambulatory Visit: Payer: Self-pay | Admitting: Urgent Care

## 2012-04-12 ENCOUNTER — Ambulatory Visit (INDEPENDENT_AMBULATORY_CARE_PROVIDER_SITE_OTHER): Payer: 59 | Admitting: Psychiatry

## 2012-04-12 ENCOUNTER — Other Ambulatory Visit: Payer: Self-pay | Admitting: Gastroenterology

## 2012-04-12 ENCOUNTER — Encounter: Payer: Self-pay | Admitting: Gastroenterology

## 2012-04-12 ENCOUNTER — Ambulatory Visit (INDEPENDENT_AMBULATORY_CARE_PROVIDER_SITE_OTHER): Payer: 59 | Admitting: Gastroenterology

## 2012-04-12 ENCOUNTER — Encounter (HOSPITAL_COMMUNITY): Payer: Self-pay | Admitting: Psychiatry

## 2012-04-12 VITALS — BP 130/90 | HR 86 | Ht 65.0 in | Wt 215.2 lb

## 2012-04-12 VITALS — BP 129/82 | HR 97 | Temp 98.4°F | Ht 65.0 in | Wt 216.6 lb

## 2012-04-12 DIAGNOSIS — R51 Headache: Secondary | ICD-10-CM

## 2012-04-12 DIAGNOSIS — F329 Major depressive disorder, single episode, unspecified: Secondary | ICD-10-CM

## 2012-04-12 DIAGNOSIS — K581 Irritable bowel syndrome with constipation: Secondary | ICD-10-CM | POA: Insufficient documentation

## 2012-04-12 DIAGNOSIS — K5900 Constipation, unspecified: Secondary | ICD-10-CM

## 2012-04-12 DIAGNOSIS — R1030 Lower abdominal pain, unspecified: Secondary | ICD-10-CM

## 2012-04-12 DIAGNOSIS — R519 Headache, unspecified: Secondary | ICD-10-CM

## 2012-04-12 DIAGNOSIS — K625 Hemorrhage of anus and rectum: Secondary | ICD-10-CM

## 2012-04-12 HISTORY — DX: Headache, unspecified: R51.9

## 2012-04-12 HISTORY — DX: Lower abdominal pain, unspecified: R10.30

## 2012-04-12 MED ORDER — LINACLOTIDE 290 MCG PO CAPS: 290.0000 ug | ORAL_CAPSULE | Freq: Every day | ORAL | Status: DC

## 2012-04-12 MED ORDER — BUPROPION HCL 100 MG PO TABS: 100.0000 mg | ORAL_TABLET | Freq: Every day | ORAL | Status: DC
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
## 2012-04-12 NOTE — Patient Instructions (Addendum)
Start taking Linzess each morning, 30 minutes before the first meal of the day. We have provided samples and sent a prescription to your pharmacy.  We have scheduled you a colonoscopy with Dr. Darrick Penna in the near future.   Review the high fiber diet, and please call if you have any questions in the meantime!  Have a Alt[place]al Christmas! :)    High-Fiber Diet Fiber is found in fruits, vegetables, and grains. A high-fiber diet encourages the addition of more whole grains, legumes, fruits, and vegetables in your diet. The recommended amount of fiber for adult males is 38 g per day. For adult females, it is 25 g per day. Pregnant and lactating women should get 28 g of fiber per day. If you have a digestive or bowel problem, ask your caregiver for advice before adding high-fiber foods to your diet. Eat a variety of high-fiber foods instead of only a select few type of foods.   PURPOSE  To increase stool bulk.   To make bowel movements more regular to prevent constipation.   To lower cholesterol.   To prevent overeating.  WHEN IS THIS DIET USED?  It may be used if you have constipation and hemorrhoids.   It may be used if you have uncomplicated diverticulosis (intestine condition) and irritable bowel syndrome.   It may be used if you need help with weight management.   It may be used if you want to add it to your diet as a protective measure against atherosclerosis, diabetes, and cancer.  SOURCES OF FIBER  Whole-grain breads and cereals.   Fruits, such as apples, oranges, bananas, berries, prunes, and pears.   Vegetables, such as green peas, carrots, sweet potatoes, beets, broccoli, cabbage, spinach, and artichokes.   Legumes, such split peas, soy, lentils.   Almonds.  FIBER CONTENT IN FOODS Starches and Grains / Dietary Fiber (g)  Cheerios, 1 cup / 3 g   Corn Flakes cereal, 1 cup / 0.7 g   Rice crispy treat cereal, 1 cup / 0.3 g   Instant oatmeal (cooked),  cup / 2 g    Frosted wheat cereal, 1 cup / 5.1 g   Brown, long-grain rice (cooked), 1 cup / 3.5 g   White, long-grain rice (cooked), 1 cup / 0.6 g   Enriched macaroni (cooked), 1 cup / 2.5 g  Legumes / Dietary Fiber (g)  Baked beans (canned, plain, or vegetarian),  cup / 5.2 g   Kidney beans (canned),  cup / 6.8 g   Pinto beans (cooked),  cup / 5.5 g  Breads and Crackers / Dietary Fiber (g)  Plain or honey graham crackers, 2 squares / 0.7 g   Saltine crackers, 3 squares / 0.3 g   Plain, salted pretzels, 10 pieces / 1.8 g   Whole-wheat bread, 1 slice / 1.9 g   White bread, 1 slice / 0.7 g   Raisin bread, 1 slice / 1.2 g   Plain bagel, 3 oz / 2 g   Flour tortilla, 1 oz / 0.9 g   Corn tortilla, 1 small / 1.5 g   Hamburger or hotdog bun, 1 small / 0.9 g  Fruits / Dietary Fiber (g)  Apple with skin, 1 medium / 4.4 g   Sweetened applesauce,  cup / 1.5 g   Banana,  medium / 1.5 g   Grapes, 10 grapes / 0.4 g   Orange, 1 small / 2.3 g   Raisin, 1.5 oz / 1.6 g  Melon, 1 cup / 1.4 g  Vegetables / Dietary Fiber (g)  Green beans (canned),  cup / 1.3 g   Carrots (cooked),  cup / 2.3 g   Broccoli (cooked),  cup / 2.8 g   Peas (cooked),  cup / 4.4 g   Mashed potatoes,  cup / 1.6 g   Lettuce, 1 cup / 0.5 g   Corn (canned),  cup / 1.6 g   Tomato,  cup / 1.1 g  Document Released: 04/19/2005 Document Revised: 10/19/2011 Document Reviewed: 07/22/2011 [facility] Patient Information 2013 [place], [place].

## 2012-04-12 NOTE — Assessment & Plan Note (Signed)
41 year old female with chronic constipation, questionable element of pelvic floor dysfunction. Tried and failed OTC agents and Amitiza. Trial Linzess 290 mcg daily, high fiber diet. Recent episode of rectal bleeding in the setting of significant constipation, likely benign. However, no prior lower GI tract evaluation. Proceed with TCS in near future due to rectal bleeding.   Proceed with colonoscopy with Dr. Darrick Penna in the near future. The risks, benefits, and alternatives have been discussed in detail with the patient. They state understanding and desire to proceed.

## 2012-04-12 NOTE — Progress Notes (Signed)
Primary Care Physician:  Lilyan Punt, MD Primary Gastroenterologist:  Dr. Darrick Penna  Chief Complaint  Patient presents with  . Irritable Bowel Syndrome    questionable  . Abdominal Pain    left side  . Constipation    HPI:   41 year old female who presents today secondary to concerns for possible IBS. Notes hx of 2 difficult pregnancies, with adenomyosis, had scar tissue that went from left ovary to appendix, adhesions. Since then, difficulty with bowel movements. Daughter is now 30 years old. Has been so constipated that she has been unable to stand up straight. Has bouts of nausea every day. +stomach cramping. Feels like a sucking feeling on left side, will have cramping, nausea, then bowel movement. Will be an all day long thing. Has had to insert fingers into vagina to push out stool. Has tried multiple OTC agents. Bled for 2 days about 6 weeks ago. Notes rectal discomfort that coincides with this. Will go up to 2 weeks without a BM. No prior colonoscopy. Has tried Amitiza, which works for a little bit then will not. Mocha starbucks frappucinos will help.   Will have N/V when very constipated.    Unfortunately, husband has left. Separated X 3 months. Pt is in counseling now.   Past Medical History  Diagnosis Date  . Headache   . Heart murmur     Past Surgical History  Procedure Date  . Tubal ligation   . Abdominal hysterectomy     still has right ovary  . Cholecystectomy   . Bladder sling x 3     Current Outpatient Prescriptions  Medication Sig Dispense Refill  . buPROPion (WELLBUTRIN) 100 MG tablet Take 1 tablet (100 mg total) by mouth daily.  30 tablet  0  . LORazepam (ATIVAN) 1 MG tablet Take 1 mg by mouth every 8 (eight) hours.      Marland Kitchen dicyclomine (BENTYL) 10 MG capsule Take 10 mg by mouth 4 (four) times daily -  before meals and at bedtime.      . Linaclotide (LINZESS) 290 MCG CAPS Take 290 mcg by mouth daily.  30 capsule  3    Allergies as of 04/12/2012  . (No  Known Allergies)    Family History  Problem Relation Age of Onset  . Alcohol abuse Mother   . Depression Mother   . Alcohol abuse Father   . Colon cancer Neg Hx     History   Social History  . Marital Status: Married    Spouse Name: N/A    Number of Children: 2  . Years of Education: N/A   Occupational History  . OFFICE [employer]     [employer]   Social History Main Topics  . Smoking status: Former Games developer  . Smokeless tobacco: Not on file     Comment: as a kid  . Alcohol Use: Yes     Comment: socially  . Drug Use: No  . Sexually Active: Not on file   Other Topics Concern  . Not on file   Social History Narrative   Currently separated X 3 months.     Review of Systems: Gen: Denies any fever, chills, fatigue, weight loss, lack of appetite.  CV: Denies chest pain, heart palpitations, peripheral edema, syncope.  Resp: Denies shortness of breath at rest or with exertion. Denies wheezing or cough.  GI: Denies dysphagia or odynophagia. Denies jaundice, hematemesis, fecal incontinence. GU : Denies urinary burning, urinary frequency, urinary hesitancy MS: plantar fasciitis Derm: Denies  rash, itching, dry skin Psych: +situational depression Heme: Denies bruising, bleeding, and enlarged lymph nodes.  Physical Exam: BP 129/82  Pulse 97  Temp 98.4 F (36.9 C) (Temporal)  Ht 5\' 5"  (1.651 m)  Wt 216 lb 9.6 oz (98.249 kg)  BMI 36.04 kg/m2 General:   Alert and oriented. Pleasant and cooperative. Well-nourished and well-developed.  Head:  Normocephalic and atraumatic. Eyes:  Without icterus, sclera clear and conjunctiva pink.  Ears:  Normal auditory acuity. Nose:  No deformity, discharge,  or lesions. Mouth:  No deformity or lesions, oral mucosa pink.  Neck:  Supple, without mass or thyromegaly. Lungs:  Clear to auscultation bilaterally. No wheezes, rales, or rhonchi. No distress.  Heart:  S1, S2 present without murmurs appreciated.  Abdomen:  +BS, soft,  non-tender and non-distended. No HSM noted. No guarding or rebound. No masses appreciated.  Rectal:  Deferred  Msk:  Symmetrical without gross deformities. Normal posture. Extremities:  Without clubbing or edema. Neurologic:  Alert and  oriented x4;  grossly normal neurologically. Skin:  Intact without significant lesions or rashes. Cervical Nodes:  No significant cervical adenopathy. Psych:  Alert and cooperative. Normal mood and affect.

## 2012-04-12 NOTE — Progress Notes (Signed)
Faxed to PCP

## 2012-04-14 NOTE — Progress Notes (Signed)
PT NEEDS CT ABD/PELVIS FOR ABD PAIN, CHANGE IN BOWEL HABITS PRIOR TO TCS.  REVIEWED. RECORDS FROM 2003 TO PRESENT.

## 2012-04-17 ENCOUNTER — Telehealth (HOSPITAL_COMMUNITY): Payer: Self-pay | Admitting: *Deleted

## 2012-04-17 ENCOUNTER — Encounter (HOSPITAL_COMMUNITY): Payer: Self-pay | Admitting: Pharmacy Technician

## 2012-04-17 NOTE — Telephone Encounter (Signed)
Left VM: When she saw Dr.Arfeen last Wednesday, he agreed she needed to stay on Ativan, even though he was not prescribing it. Her current family MD will no longer prescribe Ativan for her and would like for Dr.Arfeen to manage all her nerve medicine including her Ativan. She will need a prescription for Ativan.

## 2012-04-17 NOTE — Telephone Encounter (Signed)
Left message for pt that Dr.Arfeen had authorized a prescription of Ativan 0.5 mg twice daily #15 if she needed it before her next appt on 12/30. Asked pt to call office if medicine needed.

## 2012-04-19 ENCOUNTER — Ambulatory Visit (INDEPENDENT_AMBULATORY_CARE_PROVIDER_SITE_OTHER): Payer: 59 | Admitting: Psychiatry

## 2012-04-19 DIAGNOSIS — F329 Major depressive disorder, single episode, unspecified: Secondary | ICD-10-CM

## 2012-04-20 NOTE — Progress Notes (Signed)
Please schedule pt for CT abd/pelvis per Dr. Darrick Penna' request (needs to be before TCS).  We are assessing for any adhesive disease that could possibly be a contributor to constipation.   Thanks!

## 2012-04-22 ENCOUNTER — Telehealth: Payer: Self-pay | Admitting: Gastroenterology

## 2012-04-22 MED ORDER — PEG-KCL-NACL-NASULF-NA ASC-C 100 G PO SOLR: 1.0000 | Freq: Once | ORAL | Status: DC

## 2012-04-22 NOTE — Telephone Encounter (Signed)
Pt called. Reports no prep called in for TCS MONDAY. Rx SENT TO [facility].

## 2012-04-23 NOTE — Progress Notes (Signed)
Pt unable to be scheduled for CT prior to TCS. TCS 12/23; will likely need CT thereafter. Await report from TCS, then schedule.

## 2012-04-24 ENCOUNTER — Encounter (HOSPITAL_COMMUNITY): Payer: Self-pay | Admitting: *Deleted

## 2012-04-24 ENCOUNTER — Encounter (HOSPITAL_COMMUNITY)
Admission: RE | Disposition: A | Discharge status: Home / Self Care | Payer: Self-pay | Source: Ambulatory Visit | Attending: Gastroenterology

## 2012-04-24 ENCOUNTER — Ambulatory Visit (HOSPITAL_COMMUNITY)
Admission: RE | Admit: 2012-04-24 | Discharge: 2012-04-24 | Disposition: A | Discharge status: Home / Self Care | Payer: 59 | Source: Ambulatory Visit | Attending: Gastroenterology | Admitting: Gastroenterology

## 2012-04-24 DIAGNOSIS — K5900 Constipation, unspecified: Secondary | ICD-10-CM

## 2012-04-24 DIAGNOSIS — K5730 Diverticulosis of large intestine without perforation or abscess without bleeding: Secondary | ICD-10-CM | POA: Insufficient documentation

## 2012-04-24 DIAGNOSIS — D126 Benign neoplasm of colon, unspecified: Secondary | ICD-10-CM

## 2012-04-24 DIAGNOSIS — K648 Other hemorrhoids: Secondary | ICD-10-CM | POA: Insufficient documentation

## 2012-04-24 DIAGNOSIS — K625 Hemorrhage of anus and rectum: Secondary | ICD-10-CM

## 2012-04-24 DIAGNOSIS — R198 Other specified symptoms and signs involving the digestive system and abdomen: Secondary | ICD-10-CM | POA: Insufficient documentation

## 2012-04-24 HISTORY — PX: COLONOSCOPY: SHX5424

## 2012-04-24 SURGERY — COLONOSCOPY
Anesthesia: Moderate Sedation

## 2012-04-24 MED ORDER — MEPERIDINE HCL 100 MG/ML IJ SOLN
INTRAMUSCULAR | Status: DC | PRN
  Administered 2012-04-24: 25 mg via INTRAVENOUS
  Administered 2012-04-24: 50 mg via INTRAVENOUS
  Administered 2012-04-24: 25 mg via INTRAVENOUS

## 2012-04-24 MED ORDER — MIDAZOLAM HCL 5 MG/5ML IJ SOLN
INTRAMUSCULAR | Status: DC | PRN
  Administered 2012-04-24 (×3): 2 mg via INTRAVENOUS

## 2012-04-24 MED ORDER — MIDAZOLAM HCL 5 MG/5ML IJ SOLN
INTRAMUSCULAR | Status: AC
  Filled 2012-04-24: qty 10

## 2012-04-24 MED ORDER — PROMETHAZINE HCL 25 MG/ML IJ SOLN
INTRAMUSCULAR | Status: AC
  Filled 2012-04-24: qty 6

## 2012-04-24 MED ORDER — PROMETHAZINE HCL 25 MG/ML IJ SOLN
INTRAMUSCULAR | Status: DC | PRN
  Administered 2012-04-24: 12.5 mg via INTRAVENOUS

## 2012-04-24 MED ORDER — MEPERIDINE HCL 100 MG/ML IJ SOLN
INTRAMUSCULAR | Status: AC
  Filled 2012-04-24: qty 2

## 2012-04-24 MED ORDER — SODIUM CHLORIDE 0.45 % IV SOLN
INTRAVENOUS | Status: DC
  Administered 2012-04-24: 10:00:00 via INTRAVENOUS

## 2012-04-24 NOTE — Op Note (Signed)
[facility] [address] [place], [address]   COLONOSCOPY PROCEDURE REPORT  PATIENT: Theresa [name], Theresa [name]  MR#: [id number] BIRTHDATE: 1971/01/28 , 41  yrs. old GENDER: Female ENDOSCOPIST: Jonette Eva, MD REFERRED AO:ZHYQM Gerda Diss, M.D. PROCEDURE DATE:  04/24/2012 PROCEDURE:   Colonoscopy with snare polypectomy and Colonoscopy with cold biopsy polypectomy  INDICATIONS:CHANGE IN BOWEL HABITS: CONSTIPATION X 10 YEARS, RECTAL BLEEDING.  PMHX: URINARY INCONTINENCE AFTER BLADDER SLINGS PLACED. MAY NEED TO PRESS ON VAGINAL WALL TO EVACUATE STOOL FROM RECTUM  MEDICATIONS: Demerol 100 mg IV, Versed 8 mg IV, and Promethazine (Phenergan) 12.5mg  IV  DESCRIPTION OF PROCEDURE:    Physical exam was performed.  Informed consent was obtained from the patient after explaining the benefits, risks, and alternatives to procedure.  The patient was connected to monitor and placed in left lateral position. Continuous oxygen was provided by nasal cannula and IV medicine administered through an indwelling cannula.  After administration of sedation and rectal exam, the patients rectum was intubated and the Pentax Colonoscope [phone]  colonoscope was advanced under direct visualization to the ileum.  The scope was removed slowly by carefully examining the color, texture, anatomy, and integrity mucosa on the way out.  The patient was recovered in endoscopy and discharged home in satisfactory condition.    COLON FINDINGS: A sessile polyp measuring 5 mm in size was found in the proximal transverse colon.  A polypectomy was performed with cold forceps.  , A pedunculated polyp measuring 1.1 cm in size was found in the sigmoid colon.  A polypectomy was performed using snare cautery.  , Moderate diverticulosis was noted in the descending colon and sigmoid colon.  , and Small internal hemorrhoids were found.  PREP QUALITY: excellent. CECAL W/D TIME: 15 minutes  COMPLICATIONS:  None  ENDOSCOPIC IMPRESSION: 1.   Sessile polyp measuring 5 mm in size was found in the proximal transverse colon; polypectomy was performed with cold forceps 2.   Pedunculated polyp measuring 1.1 cm in size was found in the sigmoid colon; polypectomy was performed using snare cautery 3.   Moderate diverticulosis was noted in the descending colon and sigmoid colon 4.   Small internal hemorrhoids, CAUSING RECTAL BLEEDING   RECOMMENDATIONS: AWAIT BIOPSY HIGH FIBER DIET DRINK WATER CONTINUE LINZESS CONSIDER UROLOGY CONSULT FOR PELVIC FLOOR DYSFUNCTION OPV IN 3 MOS TCS IN 5 YEARS IF ADVANCED ADENOMA AND FIVE YEARS IF SIMPLE ADENOMA ALL FIRST DEGREE RELATIVES NEED TCS AT AGE 59 IF ADVANCED AND AGE 15 IF SIMPLE ADENOMA       _______________________________ eSignedJonette Eva, MD 04/24/2012 11:42 AM     PATIENT NAME:  Theresa [name] MR#: [id number]

## 2012-04-24 NOTE — Telephone Encounter (Signed)
Im not sure I thought is had been called in

## 2012-04-24 NOTE — H&P (Signed)
  Primary Care Physician:  Lilyan Punt, MD Primary Gastroenterologist:  Dr. Darrick Penna  Pre-Procedure History & Physical: HPI:  Theresa [name] is a 41 y.o. female here for CHANGE IN BOWEL HABITS: constipation.   Past Medical History  Diagnosis Date  . Headache   . Heart murmur     Past Surgical History  Procedure Date  . Tubal ligation   . Abdominal hysterectomy     still has right ovary  . Cholecystectomy   . Bladder sling x 3     Prior to Admission medications   Medication Sig Start Date End Date Taking? Authorizing Provider  buPROPion (WELLBUTRIN) 100 MG tablet Take 1 tablet (100 mg total) by mouth daily. 04/12/12 04/12/13 Yes Cleotis Nipper, MD  Linaclotide (LINZESS) 290 MCG CAPS Take 290 mcg by mouth daily. 04/12/12  Yes Nira Retort, NP  LORazepam (ATIVAN) 1 MG tablet Take 1 mg by mouth every 8 (eight) hours.   Yes Historical Provider, MD    Allergies as of 04/12/2012  . (No Known Allergies)    Family History  Problem Relation Age of Onset  . Alcohol abuse Mother   . Depression Mother   . Alcohol abuse Father   . Colon cancer Neg Hx     History   Social History  . Marital Status: Married    Spouse Name: N/A    Number of Children: 2  . Years of Education: N/A   Occupational History  . OFFICE [employer]     [employer]   Social History Main Topics  . Smoking status: Former Games developer  . Smokeless tobacco: Not on file     Comment: as a kid  . Alcohol Use: Yes     Comment: socially  . Drug Use: No  . Sexually Active: Not on file   Other Topics Concern  . Not on file   Social History Narrative   Currently separated X 3 months.     Review of Systems: See HPI, otherwise negative ROS   Physical Exam: BP 130/82  Pulse 78  Temp 97.5 F (36.4 C) (Oral)  Resp 18  Ht 5\' 5"  (1.651 m)  Wt 216 lb (97.977 kg)  BMI 35.94 kg/m2  SpO2 100% General:   Alert,  pleasant and cooperative in NAD Head:  Normocephalic and atraumatic. Neck:  Supple; Lungs:   Clear throughout to auscultation.    Heart:  Regular rate and rhythm. Abdomen:  Soft, nontender and nondistended. Normal bowel sounds, without guarding, and without rebound.   Neurologic:  Alert and  oriented x4;  grossly normal neurologically.  Impression/Plan:     Change in bowel habits  PLAN:  1. TCS TODAY

## 2012-04-27 ENCOUNTER — Ambulatory Visit (HOSPITAL_COMMUNITY): Payer: Self-pay | Admitting: Psychiatry

## 2012-04-28 ENCOUNTER — Encounter (HOSPITAL_COMMUNITY): Payer: Self-pay | Admitting: Gastroenterology

## 2012-05-01 ENCOUNTER — Encounter (HOSPITAL_COMMUNITY): Payer: Self-pay | Admitting: Psychiatry

## 2012-05-01 ENCOUNTER — Ambulatory Visit (INDEPENDENT_AMBULATORY_CARE_PROVIDER_SITE_OTHER): Payer: 59 | Admitting: Psychiatry

## 2012-05-01 VITALS — BP 115/96 | HR 95 | Wt 220.2 lb

## 2012-05-01 DIAGNOSIS — F329 Major depressive disorder, single episode, unspecified: Secondary | ICD-10-CM

## 2012-05-01 MED ORDER — BUPROPION HCL 75 MG PO TABS: 75.0000 mg | ORAL_TABLET | Freq: Two times a day (BID) | ORAL | Status: DC

## 2012-05-01 MED ORDER — LORAZEPAM 0.5 MG PO TABS: 0.5000 mg | ORAL_TABLET | Freq: Every day | ORAL | Status: DC | PRN
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
## 2012-05-02 ENCOUNTER — Encounter: Payer: Self-pay | Admitting: Gastroenterology

## 2012-05-02 ENCOUNTER — Telehealth: Payer: Self-pay

## 2012-05-02 NOTE — Telephone Encounter (Signed)
Pt called in reference to her Linzess. I had sent a message in My Chart and she had not checked it. I told her that Rx was sent on 04/12/2012 for the Linzess. She requested samples until she can get, but we do not have any samples of the 290 mcg. She has a few and will check with pharmacy after first of the year.

## 2012-05-04 ENCOUNTER — Ambulatory Visit (INDEPENDENT_AMBULATORY_CARE_PROVIDER_SITE_OTHER): Payer: 59 | Admitting: Psychiatry

## 2012-05-04 ENCOUNTER — Telehealth: Payer: Self-pay | Admitting: Gastroenterology

## 2012-05-04 DIAGNOSIS — F329 Major depressive disorder, single episode, unspecified: Secondary | ICD-10-CM

## 2012-05-04 DIAGNOSIS — F3289 Other specified depressive episodes: Secondary | ICD-10-CM

## 2012-05-04 NOTE — Telephone Encounter (Signed)
Path faxed to PCP, recall made 

## 2012-05-04 NOTE — Telephone Encounter (Signed)
Please call pt. She had simple adenomas removed from her colon.    FOLLOW A HIGH FIBER DIET. AVOID ITEMS THAT CAUSE BLOATING.   Next colonoscopy in 5 years.  ALL FIRST DEGREE RELATIVES NEEDS TCS AT AGE 42 BECAUSE SHE HAD A LARGE ADENOMA REMOVED.

## 2012-05-04 NOTE — Telephone Encounter (Signed)
Pt aware of results 

## 2012-05-05 ENCOUNTER — Ambulatory Visit (HOSPITAL_COMMUNITY): Payer: Self-pay | Admitting: Psychiatry

## 2012-05-05 NOTE — Patient Instructions (Signed)
Discussed orally 
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
## 2012-05-10 ENCOUNTER — Encounter: Payer: Self-pay | Admitting: Oncology

## 2012-05-17 ENCOUNTER — Ambulatory Visit (INDEPENDENT_AMBULATORY_CARE_PROVIDER_SITE_OTHER): Payer: 59 | Admitting: Psychiatry

## 2012-05-17 DIAGNOSIS — F329 Major depressive disorder, single episode, unspecified: Secondary | ICD-10-CM
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
## 2012-05-17 NOTE — Patient Instructions (Signed)
Discussed orally 

## 2012-05-24 ENCOUNTER — Ambulatory Visit (HOSPITAL_COMMUNITY): Payer: Self-pay | Admitting: Psychiatry

## 2012-05-29 ENCOUNTER — Encounter (HOSPITAL_COMMUNITY): Payer: Self-pay | Admitting: *Deleted

## 2012-06-01 ENCOUNTER — Ambulatory Visit (HOSPITAL_COMMUNITY): Payer: Self-pay | Admitting: Psychiatry

## 2012-06-01 ENCOUNTER — Ambulatory Visit (INDEPENDENT_AMBULATORY_CARE_PROVIDER_SITE_OTHER): Payer: 59 | Admitting: Psychiatry

## 2012-06-01 DIAGNOSIS — F329 Major depressive disorder, single episode, unspecified: Secondary | ICD-10-CM

## 2012-06-01 NOTE — Patient Instructions (Signed)
Discussed orally 
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
## 2012-06-09 ENCOUNTER — Ambulatory Visit (INDEPENDENT_AMBULATORY_CARE_PROVIDER_SITE_OTHER): Payer: 59 | Admitting: Psychiatry

## 2012-06-09 DIAGNOSIS — F329 Major depressive disorder, single episode, unspecified: Secondary | ICD-10-CM
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
## 2012-06-13 NOTE — Patient Instructions (Signed)
Discussed orally 

## 2012-06-16 ENCOUNTER — Ambulatory Visit (HOSPITAL_COMMUNITY): Payer: Self-pay | Admitting: Psychiatry

## 2012-06-17 ENCOUNTER — Other Ambulatory Visit: Payer: Self-pay

## 2012-06-21 ENCOUNTER — Ambulatory Visit (INDEPENDENT_AMBULATORY_CARE_PROVIDER_SITE_OTHER): Payer: 59 | Admitting: Psychiatry

## 2012-06-21 DIAGNOSIS — F329 Major depressive disorder, single episode, unspecified: Secondary | ICD-10-CM

## 2012-06-21 DIAGNOSIS — F3289 Other specified depressive episodes: Secondary | ICD-10-CM
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
## 2012-06-22 NOTE — Patient Instructions (Signed)
Discussed orally 

## 2012-06-30 ENCOUNTER — Ambulatory Visit (INDEPENDENT_AMBULATORY_CARE_PROVIDER_SITE_OTHER): Payer: 59 | Admitting: Psychiatry

## 2012-06-30 DIAGNOSIS — F3289 Other specified depressive episodes: Secondary | ICD-10-CM

## 2012-06-30 DIAGNOSIS — F329 Major depressive disorder, single episode, unspecified: Secondary | ICD-10-CM

## 2012-07-03 NOTE — Patient Instructions (Addendum)
Discussed orally 
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
## 2012-07-04 ENCOUNTER — Ambulatory Visit (HOSPITAL_COMMUNITY): Payer: Self-pay | Admitting: Psychiatry

## 2012-07-06 ENCOUNTER — Ambulatory Visit (HOSPITAL_COMMUNITY): Payer: Self-pay | Admitting: Psychiatry

## 2012-07-14 ENCOUNTER — Encounter (HOSPITAL_COMMUNITY): Payer: Self-pay | Admitting: Psychiatry

## 2012-07-14 ENCOUNTER — Ambulatory Visit (INDEPENDENT_AMBULATORY_CARE_PROVIDER_SITE_OTHER): Payer: 59 | Admitting: Psychiatry

## 2012-07-14 VITALS — Wt 220.8 lb

## 2012-07-14 DIAGNOSIS — F329 Major depressive disorder, single episode, unspecified: Secondary | ICD-10-CM

## 2012-07-14 DIAGNOSIS — F32A Depression, unspecified: Secondary | ICD-10-CM | POA: Insufficient documentation

## 2012-07-14 DIAGNOSIS — F5105 Insomnia due to other mental disorder: Secondary | ICD-10-CM | POA: Insufficient documentation

## 2012-07-14 DIAGNOSIS — F419 Anxiety disorder, unspecified: Secondary | ICD-10-CM | POA: Insufficient documentation

## 2012-07-14 DIAGNOSIS — F341 Dysthymic disorder: Secondary | ICD-10-CM

## 2012-07-14 MED ORDER — BUPROPION HCL 75 MG PO TABS: 75.0000 mg | ORAL_TABLET | Freq: Two times a day (BID) | ORAL | Status: DC

## 2012-07-14 MED ORDER — HYDROXYZINE HCL 10 MG PO TABS: 10.0000 mg | ORAL_TABLET | Freq: Three times a day (TID) | ORAL | Status: DC | PRN

## 2012-07-14 MED ORDER — AMITRIPTYLINE HCL 10 MG PO TABS: 10.0000 mg | ORAL_TABLET | Freq: Every day | ORAL | Status: DC

## 2012-07-14 NOTE — Patient Instructions (Signed)
CUT BACK/CUT OUT on sugar and carbohydrates, that means very limited fruits and starchy vegetables and very limited grains, breads  The goal is low GLYCEMIC INDEX.  CUT OUT all wheat, rye, or barley for the GLUTEN in them.  HIGH fat and LOW carbohydrate diet is the KEY.  Eat avocados, eggs, lean meat like grass fed beef and chicken  Nuts and seeds would be good foods as well.   Stevia is an excellent sweetener.  Safe for the brain.   Lowella Grip is also a good safe sweetener.  Almond butter is awesome.  Check out all this on the Internet.  Dr Heber Parkside is on the Internet with some good info about this.   Http://www.drperlmutter.com is where that is.  Strongly consider attending at least 6 [employer] Meetings to help you learn about how your helping others to the exclusion of helping yourself is actually hurting yourself and is actually an addiction to fixing others and that you need to work the 12 Step to Happiness through the [employer]. Al-Anon Family Groups could be helpful with how to deal with substance abusing family and friends. Or your own issues of being in victim role.  There are only 40 Alanon Family Group meetings a week here in [place].  Online are current listing of those meetings @ [place].org/html/meetings.html  There are [employer].  Search on line and there you can learn the format and can access the schedule for yourself.  Their number is [phone]  Call if problems or concerns.
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
## 2012-07-28 ENCOUNTER — Ambulatory Visit (INDEPENDENT_AMBULATORY_CARE_PROVIDER_SITE_OTHER): Payer: 59 | Admitting: Psychiatry

## 2012-07-28 DIAGNOSIS — F329 Major depressive disorder, single episode, unspecified: Secondary | ICD-10-CM

## 2012-08-01 NOTE — Patient Instructions (Signed)
Discussed orally 
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
## 2012-09-14 ENCOUNTER — Encounter: Payer: Self-pay | Admitting: *Deleted

## 2012-09-18 ENCOUNTER — Ambulatory Visit (INDEPENDENT_AMBULATORY_CARE_PROVIDER_SITE_OTHER): Payer: Managed Care, Other (non HMO) | Admitting: Family Medicine

## 2012-09-18 ENCOUNTER — Encounter: Payer: Self-pay | Admitting: Family Medicine

## 2012-09-18 VITALS — BP 110/78 | HR 80 | Wt 228.0 lb

## 2012-09-18 DIAGNOSIS — R739 Hyperglycemia, unspecified: Secondary | ICD-10-CM

## 2012-09-18 DIAGNOSIS — IMO0001 Reserved for inherently not codable concepts without codable children: Secondary | ICD-10-CM

## 2012-09-18 DIAGNOSIS — Z0000 Encounter for general adult medical examination without abnormal findings: Secondary | ICD-10-CM

## 2012-09-18 DIAGNOSIS — R7309 Other abnormal glucose: Secondary | ICD-10-CM

## 2012-09-18 DIAGNOSIS — K5900 Constipation, unspecified: Secondary | ICD-10-CM

## 2012-09-18 DIAGNOSIS — R5381 Other malaise: Secondary | ICD-10-CM

## 2012-09-18 DIAGNOSIS — R5383 Other fatigue: Secondary | ICD-10-CM | POA: Insufficient documentation

## 2012-09-18 HISTORY — DX: Hyperglycemia, unspecified: R73.9

## 2012-09-18 LAB — T4, FREE: Free T4: 1.18 ng/dL (ref 0.80–1.80)

## 2012-09-18 LAB — BASIC METABOLIC PANEL
BUN: 9 mg/dL (ref 6–23)
CO2: 27 mEq/L (ref 19–32)
Calcium: 9.2 mg/dL (ref 8.4–10.5)
Chloride: 105 mEq/L (ref 96–112)
Creat: 0.68 mg/dL (ref 0.50–1.10)
Glucose, Bld: 117 mg/dL — ABNORMAL HIGH (ref 70–99)
Potassium: 4.7 mEq/L (ref 3.5–5.3)
Sodium: 140 mEq/L (ref 135–145)

## 2012-09-18 LAB — HEMOGLOBIN A1C
Hgb A1c MFr Bld: 5.7 % — ABNORMAL HIGH (ref <5.7)
Mean Plasma Glucose: 117 mg/dL — ABNORMAL HIGH (ref <117)

## 2012-09-18 LAB — TSH: TSH: 1.98 u[IU]/mL (ref 0.350–4.500)

## 2012-09-18 MED ORDER — PHENTERMINE HCL 37.5 MG PO CAPS: 37.5000 mg | ORAL_CAPSULE | ORAL | Status: DC

## 2012-09-18 NOTE — Patient Instructions (Signed)
Low glycemic diet Do your labs Work hard to get away from sugar in your drinks

## 2012-09-18 NOTE — Progress Notes (Signed)
Patient having fatique. Patient states this has been going on for years. Also having body aches and hair falling out.Patient's energy level subpar. Denies fever chills sweats. Some history of constipation / on Linzess Past medical history benign, constipation, malaise fatigue lower bowel discomfort family history noncontributory social doesn't smoke under a fair amount of stress but denies being depressed     A/P #1 fatigue and tiredness-she is trying to improve her diet she is trying to exercise I do recommend some lab testing to look for prediabetes/diabetes/thyroid condition. #2 weight reduction-long discussion held regarding proper diet exercise, Adipex 37.5 mg one each morning with 3 refills followup in 3 months to see her progress #3 stress levels-she can handle these currently is not depressed. #4 history hyperglycemia I counseled her regarding avoiding sugars within the drinks and exercising. 2025 minutes spent with patient covering these issues

## 2012-09-19 LAB — SEDIMENTATION RATE: Sed Rate: 1 mm/hr (ref 0–22)

## 2012-09-21 ENCOUNTER — Other Ambulatory Visit: Payer: Self-pay | Admitting: *Deleted

## 2012-09-21 DIAGNOSIS — R7303 Prediabetes: Secondary | ICD-10-CM

## 2012-09-26 ENCOUNTER — Ambulatory Visit (HOSPITAL_COMMUNITY): Payer: Self-pay | Admitting: Psychiatry

## 2012-12-07 ENCOUNTER — Ambulatory Visit (INDEPENDENT_AMBULATORY_CARE_PROVIDER_SITE_OTHER): Payer: Managed Care, Other (non HMO) | Admitting: Nurse Practitioner

## 2012-12-07 ENCOUNTER — Encounter: Payer: Self-pay | Admitting: Nurse Practitioner

## 2012-12-07 ENCOUNTER — Encounter: Payer: Self-pay | Admitting: Family Medicine

## 2012-12-07 VITALS — BP 144/94 | Temp 97.7°F | Ht 65.0 in | Wt 209.0 lb

## 2012-12-07 DIAGNOSIS — M7711 Lateral epicondylitis, right elbow: Secondary | ICD-10-CM

## 2012-12-07 DIAGNOSIS — M7710 Lateral epicondylitis, unspecified elbow: Secondary | ICD-10-CM

## 2012-12-07 DIAGNOSIS — M719 Bursopathy, unspecified: Secondary | ICD-10-CM

## 2012-12-07 DIAGNOSIS — M778 Other enthesopathies, not elsewhere classified: Secondary | ICD-10-CM

## 2012-12-07 DIAGNOSIS — N390 Urinary tract infection, site not specified: Secondary | ICD-10-CM

## 2012-12-07 DIAGNOSIS — R300 Dysuria: Secondary | ICD-10-CM

## 2012-12-07 DIAGNOSIS — M67919 Unspecified disorder of synovium and tendon, unspecified shoulder: Secondary | ICD-10-CM

## 2012-12-07 LAB — POCT UA - MICROSCOPIC ONLY
Bacteria, U Microscopic: POSITIVE
RBC, urine, microscopic: 0

## 2012-12-07 LAB — POCT URINALYSIS DIPSTICK
Blood, UA: 50
Nitrite, UA: POSITIVE
Spec Grav, UA: <=1.005
pH, UA: 7

## 2012-12-07 MED ORDER — NAPROXEN 500 MG PO TABS: 500.0000 mg | ORAL_TABLET | Freq: Two times a day (BID) | ORAL | Status: DC

## 2012-12-07 MED ORDER — CEFPROZIL 500 MG PO TABS: 500.0000 mg | ORAL_TABLET | Freq: Two times a day (BID) | ORAL | Status: DC

## 2012-12-07 NOTE — Patient Instructions (Signed)
AZO standard as directed for 48 hrs then stop

## 2012-12-08 NOTE — Progress Notes (Signed)
Subjective:  Presents complaints of urinary symptoms for at least 2 weeks. Complaints of dysuria urgency and frequency. No fever. Nausea but no vomiting. No back pain. No pelvic pain. No vaginal discharge. Her last UTI was August 2013. Is back with her husband. No new sexual partners. At the end of the visit mentioned she's having some right mid shoulder discomfort and right lateral elbow discomfort which she relates to repetitive movements at her job. No specific history of injury.  Objective:   BP 144/94  Temp(Src) 97.7 F (36.5 C) (Oral)  Ht 5\' 5"  (1.651 m)  Wt 209 lb (94.802 kg)  BMI 34.78 kg/m2  NAD. Alert, oriented. Lungs clear. Heart regular rate rhythm. Abdomen soft nondistended with mild suprapubic area tenderness. Urine microscopic WBC TNTC per hpf with clumps of WBCs noted. Good ROM of the right shoulder with tenderness produced near midline near the a.c. joint and mild tenderness near the posterior joint. Distinct tenderness noted at the lateral epicondyle. Muscle strength 5+ bilateral. Strong radial pulses bilaterally. Sensation grossly intact.  Assessment:Dysuria - Plan: POCT urinalysis dipstick, POCT UA - Microscopic Only, Urine culture  UTI (urinary tract infection)  Shoulder tendinitis, right  Lateral epicondylitis (tennis elbow), right  Plan: Meds ordered this encounter  Medications  . cefPROZIL (CEFZIL) 500 MG tablet    Sig: Take 1 tablet (500 mg total) by mouth 2 (two) times daily.    Dispense:  14 tablet    Refill:  0    Order Specific Question:  Supervising Provider    Answer:  Merlyn Albert [[id number]]  . naproxen (NAPROSYN) 500 MG tablet    Sig: Take 1 tablet (500 mg total) by mouth 2 (two) times daily with a meal.    Dispense:  30 tablet    Refill:  0    Order Specific Question:  Supervising Provider    Answer:  Merlyn Albert [[id number]]   Given copy of exercises for her right arm. Ice applications as directed. Warning signs reviewed. Call back if any  symptoms worsen or persist. May take AZO for 48 hours then DC.

## 2012-12-09 LAB — URINE CULTURE: Colony Count: >=100000

## 2012-12-25 ENCOUNTER — Telehealth: Payer: Self-pay | Admitting: Family Medicine

## 2012-12-25 ENCOUNTER — Other Ambulatory Visit: Payer: Self-pay | Admitting: *Deleted

## 2012-12-25 MED ORDER — CIPROFLOXACIN HCL 500 MG PO TABS: 500.0000 mg | ORAL_TABLET | Freq: Two times a day (BID) | ORAL | Status: AC

## 2012-12-25 NOTE — Telephone Encounter (Signed)
Pt was seen about 2 weeks ago for an UTI, meds kind of cleared it up but never completley went away, now its back an she wants to know if she can get a new script? Or different script.... [facility] [place]

## 2012-12-25 NOTE — Telephone Encounter (Signed)
cipro 500 one bid x 5 days. Sent to pharm pt. notified

## 2013-02-02 ENCOUNTER — Other Ambulatory Visit: Payer: Self-pay | Admitting: Gastroenterology

## 2013-02-21 ENCOUNTER — Encounter: Payer: Self-pay | Admitting: Family Medicine

## 2013-02-26 ENCOUNTER — Other Ambulatory Visit: Payer: Self-pay | Admitting: Family Medicine

## 2013-02-27 NOTE — Telephone Encounter (Signed)
Ok times 2

## 2013-03-08 ENCOUNTER — Other Ambulatory Visit: Payer: Self-pay

## 2013-07-10 ENCOUNTER — Other Ambulatory Visit: Payer: Self-pay | Admitting: Family Medicine

## 2013-07-10 ENCOUNTER — Telehealth: Payer: Self-pay | Admitting: Family Medicine

## 2013-07-10 MED ORDER — PREDNISONE 20 MG PO TABS: ORAL_TABLET | ORAL | Status: AC

## 2013-07-10 NOTE — Telephone Encounter (Signed)
Patient says she was working in the yard on Sunday and got covered in poison oak. She is hoping we can call her in some prednisone for this.  [place]

## 2013-07-10 NOTE — Telephone Encounter (Signed)
pred sent in to [facility], follow up if problems

## 2013-07-10 NOTE — Telephone Encounter (Signed)
Patient calling to get a copy of her and her spouse Juanda Crumble) medical spending's for last year so that she can get her taxes done.

## 2013-07-10 NOTE — Telephone Encounter (Signed)
Pt notified med sent to pharm.  

## 2013-07-23 ENCOUNTER — Ambulatory Visit (INDEPENDENT_AMBULATORY_CARE_PROVIDER_SITE_OTHER): Payer: Managed Care, Other (non HMO) | Admitting: Nurse Practitioner

## 2013-07-23 ENCOUNTER — Encounter: Payer: Self-pay | Admitting: Nurse Practitioner

## 2013-07-23 ENCOUNTER — Encounter: Payer: Self-pay | Admitting: Family Medicine

## 2013-07-23 VITALS — BP 128/80 | Temp 98.3°F | Ht 65.0 in | Wt 226.0 lb

## 2013-07-23 DIAGNOSIS — L509 Urticaria, unspecified: Secondary | ICD-10-CM

## 2013-07-23 MED ORDER — CLOBETASOL PROPIONATE 0.05 % EX CREA: 1.0000 "application " | TOPICAL_CREAM | Freq: Two times a day (BID) | CUTANEOUS | Status: DC

## 2013-07-23 NOTE — Patient Instructions (Signed)
Loratadine 10 mg in the morning Benadryl 25 mg at night Zantac 75-150 once a day

## 2013-07-25 ENCOUNTER — Encounter: Payer: Self-pay | Admitting: Nurse Practitioner

## 2013-07-25 NOTE — Progress Notes (Signed)
Subjective:  Presents complaints of off-and-on rash over her body for the past 2 weeks. No known allergens. No changes in hygiene products. Unassociated with any particular foods. No known contacts. No fever. No cough runny nose or wheezing. No headache. No history of tick bite. No relief with steroid cream or oral prednisone. Has been under tremendous pressure the past 2 weeks due to her job.  Objective:   BP 128/80  Temp(Src) 98.3 F (36.8 C) (Oral)  Ht 5\' 5"  (1.651 m)  Wt 226 lb (102.513 kg)  BMI 37.61 kg/m2 NAD. Alert, oriented. Lungs clear. Heart regular rate rhythm. Several linear excoriated areas noted on the right lower leg. Also  fresh pink raised linear whelps noted on the left flank area from excoriation. Some excoriation and healing lesions noted on the arms. One patch of dry nonerythematous skin noted on the left upper arm.  Assessment:Urticaria  probable  Plan: Meds ordered this encounter  Medications  . DULoxetine (CYMBALTA) 60 MG capsule    Sig: Take 60 mg by mouth daily.  . Magnesium 500 MG TABS    Sig: Take by mouth daily.  . clobetasol cream (TEMOVATE) 0.05 %    Sig: Apply 1 application topically 2 (two) times daily. Prn rash up to 2 weeks    Dispense:  30 g    Refill:  0    Order Specific Question:  Supervising Provider    Answer:  Mikey Kirschner [[id number]]   Loratadine 10 mg in the morning Benadryl 25 mg at night Zantac 75-150 once a day Call back in 7-10 days if persists, sooner if worse. Also consider one-time treatment of Elimite cream if persists.

## 2013-10-02 ENCOUNTER — Telehealth: Payer: Self-pay | Admitting: Family Medicine

## 2013-10-02 NOTE — Telephone Encounter (Signed)
Pt is needing a letter saying that she has been stressed for the past couple of years At her job. Dating her hives, anxiety, an stress related appts. That she was advised by You to eleminate stress from her life on many occasions.   If you have any questions please feel free to call her anytime.   This is needed to redo her unemployment denial

## 2013-10-08 NOTE — Telephone Encounter (Signed)
Need chart Wednesday. Thanks.

## 2013-10-09 NOTE — Telephone Encounter (Signed)
Chart on desk

## 2013-10-10 ENCOUNTER — Encounter: Payer: Self-pay | Admitting: Nurse Practitioner

## 2013-10-10 NOTE — Telephone Encounter (Signed)
Danae Chen, letter being sent to you. See note in basket.

## 2013-11-22 ENCOUNTER — Ambulatory Visit (INDEPENDENT_AMBULATORY_CARE_PROVIDER_SITE_OTHER): Payer: Managed Care, Other (non HMO) | Admitting: Nurse Practitioner

## 2013-11-22 ENCOUNTER — Encounter: Payer: Self-pay | Admitting: Nurse Practitioner

## 2013-11-22 VITALS — BP 128/80 | Resp 18 | Ht 65.0 in | Wt 226.0 lb

## 2013-11-22 DIAGNOSIS — N390 Urinary tract infection, site not specified: Secondary | ICD-10-CM

## 2013-11-22 LAB — POCT URINALYSIS DIPSTICK
Nitrite, UA: POSITIVE
Spec Grav, UA: <=1.005
pH, UA: 7

## 2013-11-22 MED ORDER — CIPROFLOXACIN HCL 500 MG PO TABS: 500.0000 mg | ORAL_TABLET | Freq: Two times a day (BID) | ORAL | Status: DC

## 2013-11-22 MED ORDER — PHENTERMINE HCL 37.5 MG PO TABS: 37.5000 mg | ORAL_TABLET | Freq: Every day | ORAL | Status: DC

## 2013-11-23 ENCOUNTER — Other Ambulatory Visit: Payer: Self-pay | Admitting: Family Medicine

## 2013-11-25 LAB — URINE CULTURE: Colony Count: >=100000

## 2013-11-26 ENCOUNTER — Encounter: Payer: Self-pay | Admitting: Nurse Practitioner

## 2013-11-26 LAB — POCT UA - MICROSCOPIC ONLY: Bacteria, U Microscopic: POSITIVE

## 2013-11-26 NOTE — Progress Notes (Signed)
Subjective:  Presents complaints of urinary symptoms for the past 2 weeks. foul smelling urine. Urgency and frequency even at nighttime. No unusual pelvic pain. No back pain. No fever. Married, same sexual partner. No vaginal discharge. Nausea, rare vomiting. No abdominal pain. No history of recent UTI. Taking fluids well. Also wishes to restart phentermine to help with her weight loss efforts. Has taken this in the past without difficulty.  Objective:   BP 128/80  Resp 18  Ht 5\' 5"  (1.651 m)  Wt 226 lb (102.513 kg)  BMI 37.61 kg/m2 NAD. Alert, oriented. Lungs clear. Heart regular rate rhythm. No CVA tenderness. Abdomen soft nondistended with mild suprapubic area tenderness. Urine foul odor; WBC TNTC with some RBCs and bacteria.  Assessment: Urinary tract infection without hematuria, site unspecified - Plan: POCT urinalysis dipstick, Urine culture  Morbid obesity  Plan:  Meds ordered this encounter  Medications  . VITAMIN D, CHOLECALCIFEROL, PO    Sig: Take by mouth daily.  . Ascorbic Acid (VITAMIN C PO)    Sig: Take by mouth daily.  . Multiple Vitamin (MULTIVITAMIN) tablet    Sig: Take 1 tablet by mouth daily.  . Glucosamine-Chondroitin (OSTEO BI-FLEX REGULAR STRENGTH PO)    Sig: Take by mouth.  . phentermine (ADIPEX-P) 37.5 MG tablet    Sig: Take 1 tablet (37.5 mg total) by mouth daily before breakfast.    Dispense:  30 tablet    Refill:  2    Order Specific Question:  Supervising Provider    Answer:  Mikey Kirschner [[id number]]  . ciprofloxacin (CIPRO) 500 MG tablet    Sig: Take 1 tablet (500 mg total) by mouth 2 (two) times daily.    Dispense:  14 tablet    Refill:  0    Order Specific Question:  Supervising Provider    Answer:  Mikey Kirschner [[id number]]   AZO for 48 hours then stop. Warning signs reviewed. Call back in 4 days if symptoms persist, sooner if worse. Otherwise followup in 3 months. Reminded about preventive health physical.

## 2013-12-14 ENCOUNTER — Other Ambulatory Visit: Payer: Self-pay | Admitting: Family Medicine

## 2013-12-17 ENCOUNTER — Telehealth: Payer: Self-pay | Admitting: Family Medicine

## 2013-12-17 NOTE — Telephone Encounter (Signed)
Nurse's please talk with the patient. If it is a small area cream will work fine it is a sizable area of prednisone works better but can have side effects. Her choice.

## 2013-12-17 NOTE — Telephone Encounter (Signed)
Can we call in something for poison oak on patients leg?   [place]

## 2013-12-17 NOTE — Telephone Encounter (Signed)
Patient stated she has a small area on her leg and remembered she has some cream at home and will try the cream and call back if it gets worse or she feels she needs prednisone.

## 2014-01-16 ENCOUNTER — Telehealth: Payer: Self-pay | Admitting: Family Medicine

## 2014-01-16 NOTE — Telephone Encounter (Signed)
#  1 avoid contact with body fluids by wearing gloves. Also after doing standard care washing hands with soap and water #2 inform patient Theresa [name] will not be until tomorrow forward message to [place]

## 2014-01-16 NOTE — Telephone Encounter (Signed)
This note needs to be fwd to [place], she is requesting to speak with her

## 2014-01-16 NOTE — Telephone Encounter (Signed)
Theresa [name] would like to speak to you about several things when you get a chance  1) is her sister in law whom has been given 3 mos to live due to various things (e.g. Hep B, MRSA etc)   2) she would like to know what safety precautions to she needs to take when she sees her or cares for her? So she don't Bring things back to her family.    3) Would also like to speak with you about her Niece Theresa [name] as well (did not go into detail on this)   [phone]

## 2014-01-18 NOTE — Telephone Encounter (Signed)
Spoke with Theresa [name] directly on 01/18/14.

## 2014-01-31 ENCOUNTER — Telehealth: Payer: Self-pay | Admitting: Gastroenterology

## 2014-01-31 NOTE — Telephone Encounter (Signed)
Pt has OV coming up next week, but is asking could we call something into [facility]. She is bloated with cramps. Enemas and stool softeners haven't helped and she can't go to the bathroom. Please advise and call her at [phone]

## 2014-02-01 NOTE — Telephone Encounter (Signed)
I called pt and she said she has tried enemas, stool softners, still does not have good bowel movements, just tiny pieces. We have not seen her in over a year and a half. She has an appt here on 02/07/2014.  I asked who her PCP was and she said Dr. Wolfgang Phoenix. She has not made him aware of her problem. I asked her to please check with him since we have not seen her in so long and to make sure she keep appt on 02/07/2014.

## 2014-02-04 NOTE — Telephone Encounter (Signed)
PLEASE CALL PT. SHE MAY TAKE MIRALAX 1 SCOOP IN 8 OZ LIQUID.TID FOR 3 DAYS. 30 MINS AFTER EACH DOSE SHE SHOULD DRINK 8 OZ OF LIQUID.

## 2014-02-05 NOTE — Telephone Encounter (Signed)
Pt is aware.  

## 2014-02-07 ENCOUNTER — Ambulatory Visit (INDEPENDENT_AMBULATORY_CARE_PROVIDER_SITE_OTHER): Payer: 59 | Admitting: Gastroenterology

## 2014-02-07 ENCOUNTER — Encounter: Payer: Self-pay | Admitting: Gastroenterology

## 2014-02-07 ENCOUNTER — Other Ambulatory Visit: Payer: Self-pay

## 2014-02-07 VITALS — BP 141/82 | HR 104 | Temp 97.4°F | Ht 65.0 in | Wt 221.2 lb

## 2014-02-07 DIAGNOSIS — R1013 Epigastric pain: Secondary | ICD-10-CM | POA: Insufficient documentation

## 2014-02-07 DIAGNOSIS — K5909 Other constipation: Secondary | ICD-10-CM

## 2014-02-07 DIAGNOSIS — R110 Nausea: Secondary | ICD-10-CM

## 2014-02-07 DIAGNOSIS — R140 Abdominal distension (gaseous): Secondary | ICD-10-CM

## 2014-02-07 HISTORY — DX: Nausea: R11.0

## 2014-02-07 MED ORDER — POLYETHYLENE GLYCOL 3350 17 GM/SCOOP PO POWD: ORAL | Status: DC

## 2014-02-07 MED ORDER — LUBIPROSTONE 24 MCG PO CAPS: 24.0000 ug | ORAL_CAPSULE | Freq: Two times a day (BID) | ORAL | Status: DC

## 2014-02-07 NOTE — Assessment & Plan Note (Signed)
Chronic constipation, worsening bloating, ?pelvic floor dysfunction or even rectocele. Colonoscopy up to date. TSH normal within the last year. Did not feel like Linzess was effective enough. Will add Miralax one capful BID, Linzess 265mcg daily.   Check most recent labs, consider celiac screen.   If EGD unremarkable, labs okay, constipation persists, we may need to consider CT A/P as next step. Encouraged her to be checked for rectocele by her GYN.

## 2014-02-07 NOTE — Patient Instructions (Signed)
1. Upper endoscopy with Dr. Oneida Alar. See separate instructions. 2. Start Amitiza 35mcg twice daily with food. RX sent to your pharmacy. 3. Miralax once capful twice a day. Drink full glass of water 30 minutes after each dose. 4. I will review your most recent labs and let you know if you need further labs done.  5. Please discuss testing for Sjogren's with your rheumatologist.

## 2014-02-07 NOTE — Assessment & Plan Note (Signed)
43 y/o female with complaints of progressive postprandial epigastric/luq pain associated with nausea but no vomiting, early satiety. Denies heartburn or dysphagia. Symptoms worse over several months. Denies NSAIDs/ASA use. Requested most recent labs done by Dr. Estanislado Pandy. Offered EGD for further evaluation. DDx: gastritis, GERD, PUD, nonulcer dyspepsia.  I have discussed the risks, alternatives, benefits with regards to but not limited to the risk of reaction to medication, bleeding, infection, perforation and the patient is agreeable to proceed. Written consent to be obtained.  Given polypharmacy, phenergan 25mg  IV 30 minutes before the procedure.

## 2014-02-07 NOTE — Progress Notes (Signed)
Primary Care Physician: Sallee Lange, MD  Primary Gastroenterologist:  Barney Drain, MD   Chief Complaint  Patient presents with  . Nausea  . Abdominal Pain  . Bloated    HPI: Theresa [name] is a 43 y.o. female here for further evaluation of nausea, abdominal bloating and pain, constipation. Last seen by our practice in December 2013 at time of colonoscopy. She did have a tubular villous adenoma removed from the sigmoid colon. Moderate diverticulosis. Small internal hemorrhoids. Her next colonoscopy will be in 5 years.  Patient states that she continues to have ongoing constipation, bloating. She has been taking Linzess 236mcg daily for over a year but doesn't feel like it is helping. Passes watery stool and sometimes hard balls. Has a lot of gas with it. Stopped the medication couple of months ago. Previously also tried correctol, enemas, peppermint tea. Eating NO processed foods. Increased dietary fiber. States losing weight but increasing size. Measures her waist frequently. The other day when she was bloated her waist increased by 10 inchs.  On phentermine but takes for "energy". BM once every 2-3 weeks. Vaginal manipulation to have a BM.   No heartburn. Early satiety. No dysphagia. Epigastric/LUQ pain, ripping type pain. Doesn't matter what she eats. Feels different than her gallbladder symptoms. Worse over the past six months. Doesn't agree with IBS diagnosis. No brbpr, melena.    Current Outpatient Prescriptions  Medication Sig Dispense Refill  . clonazePAM (KLONOPIN) 0.5 MG tablet take 1 tablet by mouth twice a day  30 tablet  2  . DULoxetine (CYMBALTA) 60 MG capsule Take 60 mg by mouth daily.      . Glucosamine-Chondroitin (OSTEO BI-FLEX REGULAR STRENGTH PO) Take by mouth.      . Magnesium 500 MG TABS Take by mouth daily.      . Multiple Vitamin (MULTIVITAMIN) tablet Take 1 tablet by mouth daily.      . phentermine (ADIPEX-P) 37.5 MG tablet Take 1 tablet (37.5 mg total) by  mouth daily before breakfast.  30 tablet  2  . VITAMIN D, CHOLECALCIFEROL, PO Take by mouth daily.       No current facility-administered medications for this visit.    Allergies as of 02/07/2014  . (No Known Allergies)   Past Medical History  Diagnosis Date  . Headache(784.0)   . Heart murmur   . Depression   . Anxiety   . Obsessive-compulsive disorder   . Seizures   . GERD (gastroesophageal reflux disease)   . IFG (impaired fasting glucose)   . Fibromyalgia   . Osteoarthritis    Past Surgical History  Procedure Laterality Date  . Tubal ligation    . Abdominal hysterectomy      still has right ovary  . Cholecystectomy    . Bladder sling x 3    . Colonoscopy  04/24/2012    UXN:[id number] polyp measuring 5 mm in size was found in the proximal transverse colon; polypectomy was performed with cold forceps Pedunculated polyp measuring 1.1 cm in size was found in the sigmoid colon; polypectomy was performed using snare cautery Moderate diverticulosis was noted in the descending colon and sigmoid colon small internal hemorrhoids. next tcs 04/2017   Family History  Problem Relation Age of Onset  . Alcohol abuse Mother   . Depression Mother   . Anxiety disorder Mother   . Paranoid behavior Mother   . Alcohol abuse Father   . Hypertension Father   . Colon cancer Neg Hx   .  ADD / ADHD Neg Hx   . Bipolar disorder Neg Hx   . Dementia Neg Hx   . OCD Neg Hx   . Drug abuse Neg Hx   . Schizophrenia Neg Hx   . Seizures Neg Hx   . Sexual abuse Neg Hx   . Physical abuse Brother    History   Social History  . Marital Status: Married    Spouse Name: N/A    Number of Children: 2  . Years of Education: N/A   Occupational History  . OFFICE [employer]     [employer]   Social History Main Topics  . Smoking status: Never Smoker   . Smokeless tobacco: None     Comment: as a kid  . Alcohol Use: Yes     Comment: socially  . Drug Use: No  . Sexual Activity: Yes   Other  Topics Concern  . None   Social History Narrative   Lives with husband and 2 children.    ROS:  General: Negative for anorexia, weight loss, fever, chills, weakness. Complains of chronic fatigue.  ENT: Negative for hoarseness, difficulty swallowing , nasal congestion. CV: Negative for chest pain, angina, palpitations, dyspnea on exertion, peripheral edema.  Respiratory: Negative for dyspnea at rest, dyspnea on exertion, cough, sputum, wheezing.  GI: See history of present illness. GU:  Negative for dysuria, hematuria, urinary incontinence, urinary frequency, nocturnal urination.  Endo: Negative for unusual weight change.    Physical Examination:   BP 141/82  Pulse 104  Temp(Src) 97.4 F (36.3 C) (Oral)  Ht 5\' 5"  (1.651 m)  Wt 221 lb 3.2 oz (100.336 kg)  BMI 36.81 kg/m2  General: Well-nourished, well-developed in no acute distress.  Eyes: No icterus. Mouth: Oropharyngeal mucosa moist and pink , no lesions erythema or exudate. Lungs: Clear to auscultation bilaterally.  Heart: Regular rate and rhythm, no murmurs rubs or gallops.  Abdomen: Bowel sounds are normal, mild to moderate epigastric/left upper quadrant tenderness, nondistended, no hepatosplenomegaly or masses, no abdominal bruits or hernia , no rebound or guarding.   Extremities: No lower extremity edema. No clubbing or deformities. Neuro: Alert and oriented x 4   Skin: Warm and dry, no jaundice.   Psych: Alert and cooperative, normal mood and affect.

## 2014-02-08 NOTE — Progress Notes (Signed)
cc'ed to pcp °

## 2014-02-13 ENCOUNTER — Encounter (HOSPITAL_COMMUNITY): Payer: Self-pay | Admitting: Pharmacy Technician

## 2014-02-18 ENCOUNTER — Encounter (HOSPITAL_COMMUNITY): Payer: Self-pay | Admitting: *Deleted

## 2014-02-18 ENCOUNTER — Telehealth: Payer: Self-pay | Admitting: Gastroenterology

## 2014-02-18 ENCOUNTER — Encounter (HOSPITAL_COMMUNITY)
Admission: RE | Disposition: A | Discharge status: Home Health | Payer: Self-pay | Source: Ambulatory Visit | Attending: Gastroenterology

## 2014-02-18 ENCOUNTER — Ambulatory Visit (HOSPITAL_COMMUNITY)
Admission: RE | Admit: 2014-02-18 | Discharge: 2014-02-18 | Disposition: A | Discharge status: Home Health | Payer: 59 | Source: Ambulatory Visit | Attending: Gastroenterology | Admitting: Gastroenterology

## 2014-02-18 ENCOUNTER — Ambulatory Visit: Payer: Managed Care, Other (non HMO) | Admitting: Nurse Practitioner

## 2014-02-18 ENCOUNTER — Other Ambulatory Visit: Payer: Self-pay

## 2014-02-18 DIAGNOSIS — R110 Nausea: Secondary | ICD-10-CM

## 2014-02-18 DIAGNOSIS — F418 Other specified anxiety disorders: Secondary | ICD-10-CM | POA: Insufficient documentation

## 2014-02-18 DIAGNOSIS — K2970 Gastritis, unspecified, without bleeding: Secondary | ICD-10-CM | POA: Insufficient documentation

## 2014-02-18 DIAGNOSIS — R569 Unspecified convulsions: Secondary | ICD-10-CM | POA: Insufficient documentation

## 2014-02-18 DIAGNOSIS — Z79899 Other long term (current) drug therapy: Secondary | ICD-10-CM | POA: Diagnosis not present

## 2014-02-18 DIAGNOSIS — K5710 Diverticulosis of small intestine without perforation or abscess without bleeding: Secondary | ICD-10-CM | POA: Diagnosis not present

## 2014-02-18 DIAGNOSIS — F42 Obsessive-compulsive disorder: Secondary | ICD-10-CM | POA: Insufficient documentation

## 2014-02-18 DIAGNOSIS — K219 Gastro-esophageal reflux disease without esophagitis: Secondary | ICD-10-CM | POA: Insufficient documentation

## 2014-02-18 DIAGNOSIS — R1013 Epigastric pain: Secondary | ICD-10-CM | POA: Diagnosis present

## 2014-02-18 DIAGNOSIS — K317 Polyp of stomach and duodenum: Secondary | ICD-10-CM | POA: Diagnosis not present

## 2014-02-18 DIAGNOSIS — R140 Abdominal distension (gaseous): Secondary | ICD-10-CM

## 2014-02-18 HISTORY — PX: ESOPHAGOGASTRODUODENOSCOPY: SHX5428

## 2014-02-18 SURGERY — EGD (ESOPHAGOGASTRODUODENOSCOPY)
Anesthesia: Moderate Sedation

## 2014-02-18 MED ORDER — MIDAZOLAM HCL 5 MG/5ML IJ SOLN
INTRAMUSCULAR | Status: DC | PRN
  Administered 2014-02-18 (×2): 2 mg via INTRAVENOUS
  Administered 2014-02-18: 1 mg via INTRAVENOUS

## 2014-02-18 MED ORDER — SODIUM CHLORIDE 0.9 % IJ SOLN
INTRAMUSCULAR | Status: AC
Start: 2014-02-18 — End: 2014-02-18
  Filled 2014-02-18: qty 10

## 2014-02-18 MED ORDER — MEPERIDINE HCL 100 MG/ML IJ SOLN
INTRAMUSCULAR | Status: AC
  Filled 2014-02-18: qty 2

## 2014-02-18 MED ORDER — MIDAZOLAM HCL 5 MG/5ML IJ SOLN
INTRAMUSCULAR | Status: AC
  Filled 2014-02-18: qty 10

## 2014-02-18 MED ORDER — PROMETHAZINE HCL 25 MG/ML IJ SOLN
25.0000 mg | Freq: Once | INTRAMUSCULAR | Status: AC
  Administered 2014-02-18: 25 mg via INTRAVENOUS
  Filled 2014-02-18: qty 1

## 2014-02-18 MED ORDER — LIDOCAINE VISCOUS 2 % MT SOLN
OROMUCOSAL | Status: DC | PRN
  Administered 2014-02-18: 1 via OROMUCOSAL

## 2014-02-18 MED ORDER — DEXLANSOPRAZOLE 60 MG PO CPDR: DELAYED_RELEASE_CAPSULE | ORAL | Status: DC

## 2014-02-18 MED ORDER — MEPERIDINE HCL 100 MG/ML IJ SOLN
INTRAMUSCULAR | Status: DC | PRN
  Administered 2014-02-18 (×2): 50 mg via INTRAVENOUS

## 2014-02-18 MED ORDER — SODIUM CHLORIDE 0.9 % IV SOLN
INTRAVENOUS | Status: DC
  Administered 2014-02-18: 13:00:00 via INTRAVENOUS

## 2014-02-18 MED ORDER — LIDOCAINE VISCOUS 2 % MT SOLN
OROMUCOSAL | Status: AC
  Filled 2014-02-18: qty 15

## 2014-02-18 MED ORDER — STERILE WATER FOR IRRIGATION IR SOLN
Status: DC | PRN
  Administered 2014-02-18: 13:00:00

## 2014-02-18 NOTE — Progress Notes (Signed)
REVIEWED. AGREE. NO ADDITIONAL RECOMMENDATIONS. 

## 2014-02-18 NOTE — Telephone Encounter (Signed)
Done

## 2014-02-18 NOTE — H&P (Signed)
Primary Care Physician:  Sallee Lange, MD Primary Gastroenterologist:  Dr. Oneida Alar  Pre-Procedure History & Physical: HPI:  Theresa [name] is a 43 y.o. female here for DYSPEPSIA.  Past Medical History  Diagnosis Date  . Headache(784.0)   . Heart murmur   . Depression   . Anxiety   . Obsessive-compulsive disorder   . GERD (gastroesophageal reflux disease)   . IFG (impaired fasting glucose)   . Fibromyalgia   . Osteoarthritis   . Seizures     last seizure at age 13   Past Surgical History  Procedure Laterality Date  . Tubal ligation    . Abdominal hysterectomy      still has right ovary  . Cholecystectomy    . Bladder sling x 3    . Colonoscopy  04/24/2012    [id number] polyp measuring 5 mm in size was found in the proximal transverse colon; polypectomy was performed with cold forceps Pedunculated polyp measuring 1.1 cm in size was found in the sigmoid colon; polypectomy was performed using snare cautery Moderate diverticulosis was noted in the descending colon and sigmoid colon small internal hemorrhoids. next tcs 04/2017   Prior to Admission medications   Medication Sig Start Date End Date Taking? Authorizing Provider  Aspirin-Salicylamide-Caffeine (BC HEADACHE POWDER PO) Take 1 Package by mouth daily as needed (headache).   Yes Historical Provider, MD  Glucosamine-Chondroitin (OSTEO BI-FLEX REGULAR STRENGTH PO) Take by mouth.   Yes Historical Provider, MD  lubiprostone (AMITIZA) 24 MCG capsule Take 1 capsule (24 mcg total) by mouth 2 (two) times daily with a meal. 02/07/14  Yes Mahala Menghini, PA-C  Magnesium 500 MG TABS Take 1 tablet by mouth daily.    Yes Historical Provider, MD  Multiple Vitamin (MULTIVITAMIN) tablet Take 1 tablet by mouth daily.   Yes Historical Provider, MD  Tetrahydrozoline HCl (EYE DROPS OP) Apply 3 drops to eye daily as needed (dry eyes).   Yes Historical Provider, MD  VITAMIN D, CHOLECALCIFEROL, PO Take by mouth daily.   Yes Historical Provider, MD   clonazePAM (KLONOPIN) 0.5 MG tablet take 1 tablet by mouth twice a day prn    Nilda Simmer, NP   Allergies as of 02/07/2014  . (No Known Allergies)   Family History  Problem Relation Age of Onset  . Alcohol abuse Mother   . Depression Mother   . Anxiety disorder Mother   . Paranoid behavior Mother   . Alcohol abuse Father   . Hypertension Father   . Colon cancer Neg Hx   . ADD / ADHD Neg Hx   . Bipolar disorder Neg Hx   . Dementia Neg Hx   . OCD Neg Hx   . Drug abuse Neg Hx   . Schizophrenia Neg Hx   . Seizures Neg Hx   . Sexual abuse Neg Hx   . Physical abuse Brother    History   Social History  . Marital Status: Married    Spouse Name: N/A    Number of Children: 2  . Years of Education: N/A   Occupational History  . OFFICE [employer]     [employer]   Social History Main Topics  . Smoking status: Never Smoker   . Smokeless tobacco: Not on file     Comment: as a kid  . Alcohol Use: Yes     Comment: socially  . Drug Use: No  . Sexual Activity: Yes   Other Topics Concern  . Not on file  Social History Narrative   Lives with husband and 2 children.    Review of Systems: See HPI, otherwise negative ROS   Physical Exam: BP 127/87  Pulse 75  Temp(Src) 98.1 F (36.7 C) (Oral)  Resp 13  SpO2 99% General:   Alert,  pleasant and cooperative in NAD Head:  Normocephalic and atraumatic. Neck:  Supple; Lungs:  Clear throughout to auscultation.    Heart:  Regular rate and rhythm. Abdomen:  Soft, nontender and nondistended. Normal bowel sounds, without guarding, and without rebound.   Neurologic:  Alert and  oriented x4;  grossly normal neurologically.  Impression/Plan:    DYSPEPSIA  PLAN:  EGD TODAY

## 2014-02-18 NOTE — Telephone Encounter (Addendum)
PT COULD NOT HAVE EGD TODAY DUE TO AGITATION IN SPITE OF ADEQUATE SEDATION. NEEDS UPPER ENDOSCOPY NOV 3 WITH PROPOFOL.

## 2014-02-18 NOTE — Discharge Instructions (Signed)
I WAS UNABLE TO COMPLETE YOUR ENDOSCOPY BECAUSE YOU WERE AGITATED AND COMBATIVE IN SPITE OF ADEQUATE SEDATION.   START DEXILANT DAILY.  MINIMIZE OUR USE OF ASPIRIN, BC/GOODY POWDERS, IBUPROFEN/MOTRIN, OR NAPROXEN/ALEVE.  FOLLOW A LOW FAT DIET. SEE INFO BELOW.  YOUR PRE-OP APPT IS OCT 28 AT 1245P. UPPER ENDOSCOPY NOV 3 WITH PROPOFOL AT 0745 FOR A PROCEDURE AT 0845.  FOLLOW UP IN FEB 2016.   UPPER ENDOSCOPY AFTER CARE Read the instructions outlined below and refer to this sheet in the next week. These discharge instructions provide you with general information on caring for yourself after you leave the hospital. While your treatment has been planned according to the most current medical practices available, unavoidable complications occasionally occur. If you have any problems or questions after discharge, call DR. Hatice Bubel, [phone].  ACTIVITY  You may resume your regular activity, but move at a slower pace for the next 24 hours.   Take frequent rest periods for the next 24 hours.   Walking will help get rid of the air and reduce the bloated feeling in your belly (abdomen).   No driving for 24 hours (because of the medicine (anesthesia) used during the test).   You may shower.   Do not sign any important legal documents or operate any machinery for 24 hours (because of the anesthesia used during the test).    NUTRITION  Drink plenty of fluids.   You may resume your normal diet as instructed by your doctor.   Begin with a light meal and progress to your normal diet. Heavy or fried foods are harder to digest and may make you feel sick to your stomach (nauseated).   Avoid alcoholic beverages for 24 hours or as instructed.    MEDICATIONS  You may resume your normal medications.   WHAT YOU CAN EXPECT TODAY  Some feelings of bloating in the abdomen.   Passage of more gas than usual.    IF YOU HAD A BIOPSY TAKEN DURING THE UPPER ENDOSCOPY:  Eat a soft diet IF YOU  HAVE NAUSEA, BLOATING, ABDOMINAL PAIN, OR VOMITING.    FINDING OUT THE RESULTS OF YOUR TEST Not all test results are available during your visit. DR. Oneida Alar WILL CALL YOU WITHIN 7 DAYS OF YOUR PROCEDUE WITH YOUR RESULTS. Do not assume everything is normal if you have not heard from DR. Mayana Irigoyen IN ONE WEEK, CALL HER OFFICE AT [phone].  SEEK IMMEDIATE MEDICAL ATTENTION AND CALL THE OFFICE: [phone] IF:  You have more than a spotting of blood in your stool.   Your belly is swollen (abdominal distention).   You are nauseated or vomiting.   You have a temperature over 101F.   You have abdominal pain or discomfort that is severe or gets worse throughout the day.  Low-Fat Diet BREADS, CEREALS, PASTA, RICE, DRIED PEAS, AND BEANS These products are high in carbohydrates and most are low in fat. Therefore, they can be increased in the diet as substitutes for fatty foods. They too, however, contain calories and should not be eaten in excess. Cereals can be eaten for snacks as well as for breakfast.   FRUITS AND VEGETABLES It is good to eat fruits and vegetables. Besides being sources of fiber, both are rich in vitamins and some minerals. They help you get the daily allowances of these nutrients. Fruits and vegetables can be used for snacks and desserts.  MEATS Limit lean meat, chicken, [place], and fish to no more than 6 ounces per day.  Beef, Pork, and Lamb Use lean cuts of beef, pork, and lamb. Lean cuts include:  Extra-lean ground beef.  Arm roast.  Sirloin tip.  Center-cut ham.  Round steak.  Loin chops.  Rump roast.  Tenderloin.  Trim all fat off the outside of meats before cooking. It is not necessary to severely decrease the intake of red meat, but lean choices should be made. Lean meat is rich in protein and contains a highly absorbable form of iron. Premenopausal women, in particular, should avoid reducing lean red meat because this could increase the risk for low red blood  cells (iron-deficiency anemia).  Chicken and [place] These are good sources of protein. The fat of poultry can be reduced by removing the skin and underlying fat layers before cooking. Chicken and [place] can be substituted for lean red meat in the diet. Poultry should not be fried or covered with high-fat sauces. Fish and Shellfish Fish is a good source of protein. Shellfish contain cholesterol, but they usually are low in saturated fatty acids. The preparation of fish is important. Like chicken and [place], they should not be fried or covered with high-fat sauces. EGGS Egg whites contain no fat or cholesterol. They can be eaten often. Try 1 to 2 egg whites instead of whole eggs in recipes or use egg substitutes that do not contain yolk. MILK AND DAIRY PRODUCTS Use skim or 1% milk instead of 2% or whole milk. Decrease whole milk, natural, and processed cheeses. Use nonfat or low-fat (2%) cottage cheese or low-fat cheeses made from vegetable oils. Choose nonfat or low-fat (1 to 2%) yogurt. Experiment with evaporated skim milk in recipes that call for heavy cream. Substitute low-fat yogurt or low-fat cottage cheese for sour cream in dips and salad dressings. Have at least 2 servings of low-fat dairy products, such as 2 glasses of skim (or 1%) milk each day to help get your daily calcium intake. FATS AND OILS Reduce the total intake of fats, especially saturated fat. Butterfat, lard, and beef fats are high in saturated fat and cholesterol. These should be avoided as much as possible. Vegetable fats do not contain cholesterol, but certain vegetable fats, such as coconut oil, palm oil, and palm kernel oil are very high in saturated fats. These should be limited. These fats are often used in bakery goods, processed foods, popcorn, oils, and nondairy creamers. Vegetable shortenings and some peanut butters contain hydrogenated oils, which are also saturated fats. Read the labels on these foods and check for  saturated vegetable oils. Unsaturated vegetable oils and fats do not raise blood cholesterol. However, they should be limited because they are fats and are high in calories. Total fat should still be limited to 30% of your daily caloric intake. Desirable liquid vegetable oils are corn oil, cottonseed oil, olive oil, canola oil, safflower oil, soybean oil, and sunflower oil. Peanut oil is not as good, but small amounts are acceptable. Buy a heart-healthy tub margarine that has no partially hydrogenated oils in the ingredients. Mayonnaise and salad dressings often are made from unsaturated fats, but they should also be limited because of their high calorie and fat content. Seeds, nuts, peanut butter, olives, and avocados are high in fat, but the fat is mainly the unsaturated type. These foods should be limited mainly to avoid excess calories and fat. OTHER EATING TIPS Snacks  Most sweets should be limited as snacks. They tend to be rich in calories and fats, and their caloric content outweighs their nutritional value.  Some good choices in snacks are graham crackers, melba toast, soda crackers, bagels (no egg), English muffins, fruits, and vegetables. These snacks are preferable to snack crackers, [place] fries, TORTILLA CHIPS, and POTATO chips. Popcorn should be air-popped or cooked in small amounts of liquid vegetable oil. Desserts Eat fruit, low-fat yogurt, and fruit ices instead of pastries, cake, and cookies. Sherbet, angel food cake, gelatin dessert, frozen low-fat yogurt, or other frozen products that do not contain saturated fat (pure fruit juice bars, frozen ice pops) are also acceptable.  COOKING METHODS Choose those methods that use little or no fat. They include: Poaching.  Braising.  Steaming.  Grilling.  Baking.  Stir-frying.  Broiling.  Microwaving.  Foods can be cooked in a nonstick pan without added fat, or use a nonfat cooking spray in regular cookware. Limit fried foods and avoid  frying in saturated fat. Add moisture to lean meats by using water, broth, cooking wines, and other nonfat or low-fat sauces along with the cooking methods mentioned above. Soups and stews should be chilled after cooking. The fat that forms on top after a few hours in the refrigerator should be skimmed off. When preparing meals, avoid using excess salt. Salt can contribute to raising blood pressure in some people.  EATING AWAY FROM HOME Order entres, potatoes, and vegetables without sauces or butter. When meat exceeds the size of a deck of cards (3 to 4 ounces), the rest can be taken home for another meal. Choose vegetable or fruit salads and ask for low-calorie salad dressings to be served on the side. Use dressings sparingly. Limit high-fat toppings, such as bacon, crumbled eggs, cheese, sunflower seeds, and olives. Ask for heart-healthy tub margarine instead of butter.

## 2014-02-21 ENCOUNTER — Encounter (HOSPITAL_COMMUNITY): Payer: Self-pay | Admitting: Pharmacy Technician

## 2014-02-22 ENCOUNTER — Encounter (HOSPITAL_COMMUNITY): Payer: Self-pay | Admitting: Gastroenterology

## 2014-02-27 ENCOUNTER — Encounter (HOSPITAL_COMMUNITY)
Admit: 2014-02-27 | Discharge: 2014-02-27 | Disposition: A | Discharge status: Home / Self Care | Payer: 59 | Source: Ambulatory Visit | Attending: Gastroenterology | Admitting: Gastroenterology

## 2014-02-27 ENCOUNTER — Other Ambulatory Visit: Payer: Self-pay

## 2014-02-27 ENCOUNTER — Encounter (HOSPITAL_COMMUNITY): Payer: Self-pay

## 2014-02-27 DIAGNOSIS — Z01818 Encounter for other preprocedural examination: Secondary | ICD-10-CM | POA: Diagnosis present

## 2014-02-27 LAB — HEMOGLOBIN AND HEMATOCRIT, BLOOD
HCT: 37.4 % (ref 36.0–46.0)
Hemoglobin: 12.5 g/dL (ref 12.0–15.0)

## 2014-02-27 LAB — BASIC METABOLIC PANEL
Anion gap: 16 — ABNORMAL HIGH (ref 5–15)
BUN: 10 mg/dL (ref 6–23)
CO2: 21 mEq/L (ref 19–32)
Calcium: 8.8 mg/dL (ref 8.4–10.5)
Chloride: 101 mEq/L (ref 96–112)
Creatinine, Ser: 0.73 mg/dL (ref 0.50–1.10)
GFR calc Af Amer: >90 mL/min (ref >90)
GFR calc non Af Amer: >90 mL/min (ref >90)
Glucose, Bld: 132 mg/dL — ABNORMAL HIGH (ref 70–99)
Potassium: 4 mEq/L (ref 3.7–5.3)
Sodium: 138 mEq/L (ref 137–147)

## 2014-02-27 NOTE — Patient Instructions (Signed)
Theresa [name]  02/27/2014   Your procedure is scheduled on:   03/05/2014  Report to [facility] at  76  AM.  Call this number if you have problems the morning of surgery: [phone]   Remember:   Do not eat food or drink liquids after midnight.   Take these medicines the morning of surgery with A SIP OF WATER:  Klonopin, dexilant, amtiza   Do not wear jewelry, make-up or nail polish.  Do not wear lotions, powders, or perfumes.   Do not shave 48 hours prior to surgery. Men may shave face and neck.  Do not bring valuables to the hospital.  [facility] is not responsible for any belongings or valuables.               Contacts, dentures or bridgework may not be worn into surgery.  Leave suitcase in the car. After surgery it may be brought to your room.  For patients admitted to the hospital, discharge time is determined by your treatment team.               Patients discharged the day of surgery will not be allowed to drive home.  Name and phone number of your driver: family  Special Instructions: N/A   Please read over the following fact sheets that you were given: Pain Booklet, Coughing and Deep Breathing, Surgical Site Infection Prevention, Anesthesia Post-op Instructions and Care and Recovery After Surgery Esophagogastroduodenoscopy Esophagogastroduodenoscopy (EGD) is a procedure to examine the lining of the esophagus, stomach, and first part of the small intestine (duodenum). A long, flexible, lighted tube with a camera attached (endoscope) is inserted down the throat to view these organs. This procedure is done to detect problems or abnormalities, such as inflammation, bleeding, ulcers, or growths, in order to treat them. The procedure lasts about 5-20 minutes. It is usually an outpatient procedure, but it may need to be performed in emergency cases in the hospital. LET YOUR CAREGIVER KNOW ABOUT:   Allergies to food or medicine.  All medicines you are taking, including  vitamins, herbs, eyedrops, and over-the-counter medicines and creams.  Use of steroids (by mouth or creams).  Previous problems you or members of your family have had with the use of anesthetics.  Any blood disorders you have.  Previous surgeries you have had.  Other health problems you have.  Possibility of pregnancy, if this applies. RISKS AND COMPLICATIONS  Generally, EGD is a safe procedure. However, as with any procedure, complications can occur. Possible complications include:  Infection.  Bleeding.  Tearing (perforation) of the esophagus, stomach, or duodenum.  Difficulty breathing or not being able to breath.  Excessive sweating.  Spasms of the larynx.  Slowed heartbeat.  Low blood pressure. BEFORE THE PROCEDURE  Do not eat or drink anything for 6-8 hours before the procedure or as directed by your caregiver.  Ask your caregiver about changing or stopping your regular medicines.  If you wear dentures, be prepared to remove them before the procedure.  Arrange for someone to drive you home after the procedure. PROCEDURE   A vein will be accessed to give medicines and fluids. A medicine to relax you (sedative) and a pain reliever will be given through that access into the vein.  A numbing medicine (local anesthetic) may be sprayed on your throat for comfort and to stop you from gagging or coughing.  A mouth guard may be placed in your mouth to protect your teeth  and to keep you from biting on the endoscope.  You will be asked to lie on your left side.  The endoscope is inserted down your throat and into the esophagus, stomach, and duodenum.  Air is put through the endoscope to allow your caregiver to view the lining of your esophagus clearly.  The esophagus, stomach, and duodenum is then examined. During the exam, your caregiver may:  Remove tissue to be examined under a microscope (biopsy) for inflammation, infection, or other medical problems.  Remove  growths.  Remove objects (foreign bodies) that are stuck.  Treat any bleeding with medicines or other devices that stop tissues from bleeding (hot cautery, clipping devices).  Widen (dilate) or stretch narrowed areas of the esophagus and stomach.  The endoscope will then be withdrawn. AFTER THE PROCEDURE  You will be taken to a recovery area to be monitored. You will be able to go home once you are stable and alert.  Do not eat or drink anything until the local anesthetic and numbing medicines have worn off. You may choke.  It is normal to feel bloated, have pain with swallowing, or have a sore throat for a short time. This will wear off.  Your caregiver should be able to discuss his or her findings with you. It will take longer to discuss the test results if any biopsies were taken. Document Released: 08/20/2004 Document Revised: 09/03/2013 Document Reviewed: 03/22/2012 [facility] Patient Information 2015 [place], [place]. This information is not intended to replace advice given to you by your health care provider. Make sure you discuss any questions you have with your health care provider. PATIENT INSTRUCTIONS POST-ANESTHESIA  IMMEDIATELY FOLLOWING SURGERY:  Do not drive or operate machinery for the first twenty four hours after surgery.  Do not make any important decisions for twenty four hours after surgery or while taking narcotic pain medications or sedatives.  If you develop intractable nausea and vomiting or a severe headache please notify your doctor immediately.  FOLLOW-UP:  Please make an appointment with your surgeon as instructed. You do not need to follow up with anesthesia unless specifically instructed to do so.  WOUND CARE INSTRUCTIONS (if applicable):  Keep a dry clean dressing on the anesthesia/puncture wound site if there is drainage.  Once the wound has quit draining you may leave it open to air.  Generally you should leave the bandage intact for twenty four hours unless  there is drainage.  If the epidural site drains for more than 36-48 hours please call the anesthesia department.  QUESTIONS?:  Please feel free to call your physician or the hospital operator if you have any questions, and they will be happy to assist you.

## 2014-03-05 ENCOUNTER — Ambulatory Visit (HOSPITAL_COMMUNITY)
Admission: RE | Admit: 2014-03-05 | Discharge: 2014-03-05 | Disposition: A | Discharge status: Home / Self Care | Payer: 59 | Source: Ambulatory Visit | Attending: Gastroenterology | Admitting: Gastroenterology

## 2014-03-05 ENCOUNTER — Ambulatory Visit (HOSPITAL_COMMUNITY): Payer: 59 | Admitting: Anesthesiology

## 2014-03-05 ENCOUNTER — Encounter (HOSPITAL_COMMUNITY): Payer: Self-pay | Admitting: *Deleted

## 2014-03-05 ENCOUNTER — Encounter (HOSPITAL_COMMUNITY)
Admission: RE | Disposition: A | Discharge status: Home / Self Care | Payer: Self-pay | Source: Ambulatory Visit | Attending: Gastroenterology

## 2014-03-05 DIAGNOSIS — F418 Other specified anxiety disorders: Secondary | ICD-10-CM | POA: Diagnosis not present

## 2014-03-05 DIAGNOSIS — R110 Nausea: Secondary | ICD-10-CM

## 2014-03-05 DIAGNOSIS — Z79899 Other long term (current) drug therapy: Secondary | ICD-10-CM | POA: Insufficient documentation

## 2014-03-05 DIAGNOSIS — K5710 Diverticulosis of small intestine without perforation or abscess without bleeding: Secondary | ICD-10-CM | POA: Diagnosis not present

## 2014-03-05 DIAGNOSIS — K219 Gastro-esophageal reflux disease without esophagitis: Secondary | ICD-10-CM | POA: Diagnosis not present

## 2014-03-05 DIAGNOSIS — R140 Abdominal distension (gaseous): Secondary | ICD-10-CM

## 2014-03-05 DIAGNOSIS — K317 Polyp of stomach and duodenum: Secondary | ICD-10-CM | POA: Insufficient documentation

## 2014-03-05 DIAGNOSIS — R569 Unspecified convulsions: Secondary | ICD-10-CM | POA: Insufficient documentation

## 2014-03-05 DIAGNOSIS — M1990 Unspecified osteoarthritis, unspecified site: Secondary | ICD-10-CM | POA: Diagnosis not present

## 2014-03-05 DIAGNOSIS — R1013 Epigastric pain: Secondary | ICD-10-CM | POA: Diagnosis present

## 2014-03-05 HISTORY — PX: ESOPHAGOGASTRODUODENOSCOPY (EGD) WITH PROPOFOL: SHX5813

## 2014-03-05 HISTORY — PX: BIOPSY: SHX5522

## 2014-03-05 LAB — GLUCOSE, CAPILLARY: Glucose-Capillary: 111 mg/dL — ABNORMAL HIGH (ref 70–99)

## 2014-03-05 SURGERY — ESOPHAGOGASTRODUODENOSCOPY (EGD) WITH PROPOFOL
Anesthesia: Monitor Anesthesia Care

## 2014-03-05 MED ORDER — ONDANSETRON HCL 4 MG/2ML IJ SOLN
4.0000 mg | Freq: Once | INTRAMUSCULAR | Status: AC
  Administered 2014-03-05: 4 mg via INTRAVENOUS
  Filled 2014-03-05: qty 2

## 2014-03-05 MED ORDER — PROPOFOL INFUSION 10 MG/ML OPTIME
INTRAVENOUS | Status: DC | PRN
  Administered 2014-03-05: 100 ug/kg/min via INTRAVENOUS

## 2014-03-05 MED ORDER — MIDAZOLAM HCL 2 MG/2ML IJ SOLN
1.0000 mg | INTRAMUSCULAR | Status: AC | PRN
  Administered 2014-03-05 (×3): 2 mg via INTRAVENOUS
  Filled 2014-03-05 (×3): qty 2

## 2014-03-05 MED ORDER — PROPOFOL 10 MG/ML IV EMUL
INTRAVENOUS | Status: AC
  Filled 2014-03-05: qty 20

## 2014-03-05 MED ORDER — FENTANYL CITRATE 0.05 MG/ML IJ SOLN
INTRAMUSCULAR | Status: AC
  Filled 2014-03-05: qty 2

## 2014-03-05 MED ORDER — LACTATED RINGERS IV SOLN
INTRAVENOUS | Status: DC | PRN
  Administered 2014-03-05: 10:00:00 via INTRAVENOUS

## 2014-03-05 MED ORDER — FENTANYL CITRATE 0.05 MG/ML IJ SOLN
25.0000 ug | INTRAMUSCULAR | Status: AC
  Administered 2014-03-05 (×2): 25 ug via INTRAVENOUS

## 2014-03-05 MED ORDER — LIDOCAINE HCL (PF) 1 % IJ SOLN
INTRAMUSCULAR | Status: AC
  Filled 2014-03-05: qty 5

## 2014-03-05 MED ORDER — PROPOFOL 10 MG/ML IV BOLUS
INTRAVENOUS | Status: DC | PRN
  Administered 2014-03-05: 100 mg via INTRAVENOUS
  Administered 2014-03-05 (×3): 20 mg via INTRAVENOUS

## 2014-03-05 MED ORDER — LIDOCAINE HCL (CARDIAC) 10 MG/ML IV SOLN
INTRAVENOUS | Status: DC | PRN
  Administered 2014-03-05: 50 mg via INTRAVENOUS

## 2014-03-05 MED ORDER — LIDOCAINE VISCOUS 2 % MT SOLN
OROMUCOSAL | Status: AC
  Filled 2014-03-05: qty 15

## 2014-03-05 MED ORDER — LACTATED RINGERS IV SOLN
INTRAVENOUS | Status: DC
  Administered 2014-03-05: 1000 mL via INTRAVENOUS

## 2014-03-05 MED ORDER — LIDOCAINE VISCOUS 2 % MT SOLN
15.0000 mL | OROMUCOSAL | Status: AC
  Administered 2014-03-05 (×2): 7.5 mL via OROMUCOSAL

## 2014-03-05 SURGICAL SUPPLY — 18 items
BLOCK BITE 60FR ADLT L/F BLUE (MISCELLANEOUS) ×2 IMPLANT
ELECT REM PT RETURN 9FT ADLT (ELECTROSURGICAL)
ELECTRODE REM PT RTRN 9FT ADLT (ELECTROSURGICAL) IMPLANT
FLOOR PAD 36X40 (MISCELLANEOUS) ×2
FORCEPS BIOP RAD 4 LRG CAP 4 (CUTTING FORCEPS) ×2 IMPLANT
FORMALIN 10 PREFIL 20ML (MISCELLANEOUS) ×4 IMPLANT
KIT CLEAN ENDO COMPLIANCE (KITS) ×2 IMPLANT
MANIFOLD NEPTUNE II (INSTRUMENTS) ×2 IMPLANT
NEEDLE SCLEROTHERAPY 25GX240 (NEEDLE) IMPLANT
PAD FLOOR 36X40 (MISCELLANEOUS) ×1 IMPLANT
PROBE APC STR FIRE (PROBE) IMPLANT
PROBE INJECTION GOLD (MISCELLANEOUS)
PROBE INJECTION GOLD 7FR (MISCELLANEOUS) IMPLANT
SNARE SHORT THROW 13M SML OVAL (MISCELLANEOUS) IMPLANT
SYR 50ML LL SCALE MARK (SYRINGE) ×2 IMPLANT
SYR INFLATION 60ML (SYRINGE) IMPLANT
TUBING IRRIGATION ENDOGATOR (MISCELLANEOUS) ×2 IMPLANT
WATER STERILE IRR 1000ML POUR (IV SOLUTION) ×2 IMPLANT

## 2014-03-05 NOTE — Anesthesia Procedure Notes (Signed)
Procedure Name: MAC Date/Time: 03/05/2014 10:00 AM Performed by: Michele Rockers Pre-anesthesia Checklist: Patient identified, Emergency Drugs available, Suction available, Timeout performed and Patient being monitored Patient Re-evaluated:Patient Re-evaluated prior to inductionOxygen Delivery Method: Non-rebreather mask

## 2014-03-05 NOTE — Anesthesia Postprocedure Evaluation (Signed)
  Anesthesia Post-op Note  Patient: Theresa [name]  Procedure(s) Performed: Procedure(s): ESOPHAGOGASTRODUODENOSCOPY (EGD) WITH PROPOFOL (N/A) DUODENAL AND GASTRIC BIOPSY (N/A)  Patient Location: PACU  Anesthesia Type:MAC  Level of Consciousness: awake, alert , oriented and pateint uncooperative  Airway and Oxygen Therapy: Patient Spontanous Breathing and Patient connected to face mask oxygen  Post-op Pain: none  Post-op Assessment: Post-op Vital signs reviewed, Patient's Cardiovascular Status Stable, Respiratory Function Stable, Patent Airway, No signs of Nausea or vomiting and Pain level controlled  Post-op Vital Signs: Reviewed and stable  Last Vitals:  Filed Vitals:   03/05/14 0955  BP: 135/86  Pulse:   Temp:   Resp: 23    Complications: No apparent anesthesia complications

## 2014-03-05 NOTE — Interval H&P Note (Signed)
History and Physical Interval Note:  03/05/2014 9:13 AM  Theresa [name]  has presented today for surgery, with the diagnosis of nausea/bloading/epigastric pain  The various methods of treatment have been discussed with the patient and family. After consideration of risks, benefits and other options for treatment, the patient has consented to  Procedure(s) with comments: ESOPHAGOGASTRODUODENOSCOPY (EGD) WITH PROPOFOL (N/A) - 845 - moved to 9:30 - Ginger notified pt as a surgical intervention .  The patient's history has been reviewed, patient examined, no change in status, stable for surgery.  I have reviewed the patient's chart and labs.  Questions were answered to the patient's satisfaction.     [employer]

## 2014-03-05 NOTE — Transfer of Care (Signed)
Immediate Anesthesia Transfer of Care Note  Patient: Theresa [name]  Procedure(s) Performed: Procedure(s): ESOPHAGOGASTRODUODENOSCOPY (EGD) WITH PROPOFOL (N/A) DUODENAL AND GASTRIC BIOPSY (N/A)  Patient Location: PACU  Anesthesia Type:MAC  Level of Consciousness: awake, alert , oriented, patient cooperative and responds to stimulation  Airway & Oxygen Therapy: Patient Spontanous Breathing and Patient connected to face mask oxygen  Post-op Assessment: Report given to PACU RN, Post -op Vital signs reviewed and stable and Patient moving all extremities  Post vital signs: Reviewed and stable  Complications: No apparent anesthesia complications

## 2014-03-05 NOTE — Anesthesia Preprocedure Evaluation (Signed)
Anesthesia Evaluation  Patient identified by MRN, date of birth, ID band Patient awake    Reviewed: Allergy & Precautions, H&P , NPO status , Patient's Chart, lab work & pertinent test results  Airway Mallampati: II  TM Distance: >3 FB     Dental  (+) Teeth Intact   Pulmonary  breath sounds clear to auscultation        Cardiovascular Rhythm:Regular Rate:Normal     Neuro/Psych  Headaches, Seizures -, Well Controlled,  PSYCHIATRIC DISORDERS Anxiety Depression    GI/Hepatic GERD-  ,  Endo/Other    Renal/GU      Musculoskeletal  (+) Arthritis -, Fibromyalgia -  Abdominal   Peds  Hematology   Anesthesia Other Findings   Reproductive/Obstetrics                             Anesthesia Physical Anesthesia Plan  ASA: II  Anesthesia Plan: MAC   Post-op Pain Management:    Induction: Intravenous  Airway Management Planned: Simple Face Mask  Additional Equipment:   Intra-op Plan:   Post-operative Plan:   Informed Consent: I have reviewed the patients History and Physical, chart, labs and discussed the procedure including the risks, benefits and alternatives for the proposed anesthesia with the patient or authorized representative who has indicated his/her understanding and acceptance.     Plan Discussed with:   Anesthesia Plan Comments:         Anesthesia Quick Evaluation

## 2014-03-05 NOTE — H&P (View-Only) (Signed)
Primary Care Physician:  Sallee Lange, MD Primary Gastroenterologist:  Dr. Oneida Alar  Pre-Procedure History & Physical: HPI:  Theresa [name] is a 43 y.o. female here for DYSPEPSIA.  Past Medical History  Diagnosis Date  . Headache(784.0)   . Heart murmur   . Depression   . Anxiety   . Obsessive-compulsive disorder   . GERD (gastroesophageal reflux disease)   . IFG (impaired fasting glucose)   . Fibromyalgia   . Osteoarthritis   . Seizures     last seizure at age 60   Past Surgical History  Procedure Laterality Date  . Tubal ligation    . Abdominal hysterectomy      still has right ovary  . Cholecystectomy    . Bladder sling x 3    . Colonoscopy  04/24/2012    RKY:HCWCBJS polyp measuring 5 mm in size was found in the proximal transverse colon; polypectomy was performed with cold forceps Pedunculated polyp measuring 1.1 cm in size was found in the sigmoid colon; polypectomy was performed using snare cautery Moderate diverticulosis was noted in the descending colon and sigmoid colon small internal hemorrhoids. next tcs 04/2017   Prior to Admission medications   Medication Sig Start Date End Date Taking? Authorizing Provider  Aspirin-Salicylamide-Caffeine (BC HEADACHE POWDER PO) Take 1 Package by mouth daily as needed (headache).   Yes Historical Provider, MD  Glucosamine-Chondroitin (OSTEO BI-FLEX REGULAR STRENGTH PO) Take by mouth.   Yes Historical Provider, MD  lubiprostone (AMITIZA) 24 MCG capsule Take 1 capsule (24 mcg total) by mouth 2 (two) times daily with a meal. 02/07/14  Yes Mahala Menghini, PA-C  Magnesium 500 MG TABS Take 1 tablet by mouth daily.    Yes Historical Provider, MD  Multiple Vitamin (MULTIVITAMIN) tablet Take 1 tablet by mouth daily.   Yes Historical Provider, MD  Tetrahydrozoline HCl (EYE DROPS OP) Apply 3 drops to eye daily as needed (dry eyes).   Yes Historical Provider, MD  VITAMIN D, CHOLECALCIFEROL, PO Take by mouth daily.   Yes Historical Provider, MD   clonazePAM (KLONOPIN) 0.5 MG tablet take 1 tablet by mouth twice a day prn    Nilda Simmer, NP   Allergies as of 02/07/2014  . (No Known Allergies)   Family History  Problem Relation Age of Onset  . Alcohol abuse Mother   . Depression Mother   . Anxiety disorder Mother   . Paranoid behavior Mother   . Alcohol abuse Father   . Hypertension Father   . Colon cancer Neg Hx   . ADD / ADHD Neg Hx   . Bipolar disorder Neg Hx   . Dementia Neg Hx   . OCD Neg Hx   . Drug abuse Neg Hx   . Schizophrenia Neg Hx   . Seizures Neg Hx   . Sexual abuse Neg Hx   . Physical abuse Brother    History   Social History  . Marital Status: Married    Spouse Name: N/A    Number of Children: 2  . Years of Education: N/A   Occupational History  . OFFICE [employer]     [employer]   Social History Main Topics  . Smoking status: Never Smoker   . Smokeless tobacco: Not on file     Comment: as a kid  . Alcohol Use: Yes     Comment: socially  . Drug Use: No  . Sexual Activity: Yes   Other Topics Concern  . Not on file  Social History Narrative   Lives with husband and 2 children.    Review of Systems: See HPI, otherwise negative ROS   Physical Exam: BP 127/87  Pulse 75  Temp(Src) 98.1 F (36.7 C) (Oral)  Resp 13  SpO2 99% General:   Alert,  pleasant and cooperative in NAD Head:  Normocephalic and atraumatic. Neck:  Supple; Lungs:  Clear throughout to auscultation.    Heart:  Regular rate and rhythm. Abdomen:  Soft, nontender and nondistended. Normal bowel sounds, without guarding, and without rebound.   Neurologic:  Alert and  oriented x4;  grossly normal neurologically.  Impression/Plan:    DYSPEPSIA  PLAN:  EGD TODAY

## 2014-03-05 NOTE — Discharge Instructions (Signed)
You have gastritis & DUODENITIS. I biopsied your stomach & DUODENUM.   CONTINUE YOUR WEIGHT LOSS EFFORTS. LOSE 10 LBS.  DRINK WATER TO KEEP YOUR URINE LIGHT YELLOW.  FOLLOW A LOW FAT DIET. SEE INFO BELOW.  CONTINUE [place].  YOUR BIOPSY WILL BE BACK IN 14 DAYS.  FOLLOW UP IN 3 MOS.    UPPER ENDOSCOPY AFTER CARE Read the instructions outlined below and refer to this sheet in the next week. These discharge instructions provide you with general information on caring for yourself after you leave the hospital. While your treatment has been planned according to the most current medical practices available, unavoidable complications occasionally occur. If you have any problems or questions after discharge, call DR. Adiel Mcnamara, [phone].  ACTIVITY  You may resume your regular activity, but move at a slower pace for the next 24 hours.   Take frequent rest periods for the next 24 hours.   Walking will help get rid of the air and reduce the bloated feeling in your belly (abdomen).   No driving for 24 hours (because of the medicine (anesthesia) used during the test).   You may shower.   Do not sign any important legal documents or operate any machinery for 24 hours (because of the anesthesia used during the test).    NUTRITION  Drink plenty of fluids.   You may resume your normal diet as instructed by your doctor.   Begin with a light meal and progress to your normal diet. Heavy or fried foods are harder to digest and may make you feel sick to your stomach (nauseated).   Avoid alcoholic beverages for 24 hours or as instructed.    MEDICATIONS  You may resume your normal medications.   WHAT YOU CAN EXPECT TODAY  Some feelings of bloating in the abdomen.   Passage of more gas than usual.    IF YOU HAD A BIOPSY TAKEN DURING THE UPPER ENDOSCOPY:  Eat a soft diet IF YOU HAVE NAUSEA, BLOATING, ABDOMINAL PAIN, OR VOMITING.    FINDING OUT THE RESULTS OF YOUR  TEST Not all test results are available during your visit. DR. Oneida Alar WILL CALL YOU WITHIN 14 DAYS OF YOUR PROCEDUE WITH YOUR RESULTS. Do not assume everything is normal if you have not heard from DR. Jasier Calabretta IN 14 DAYS, CALL HER OFFICE AT [phone].  SEEK IMMEDIATE MEDICAL ATTENTION AND CALL THE OFFICE: [phone] IF:  You have more than a spotting of blood in your stool.   Your belly is swollen (abdominal distention).   You are nauseated or vomiting.   You have a temperature over 101F.   You have abdominal pain or discomfort that is severe or gets worse throughout the day.   Gastritis/DUODENITIS  Gastritis is an inflammation (the body's way of reacting to injury and/or infection) of the stomach. DUODENITIS is an inflammation (the body's way of reacting to injury and/or infection) of the FIRST PART OF THE SMALL INTESTINES. It is often caused by bacterial (germ) infections. It can also be caused BY ASPIRIN, BC/GOODY POWDER'S, (IBUPROFEN) MOTRIN, OR ALEVE (NAPROXEN), chemicals (including alcohol), SPICY FOODS, and medications. This illness may be associated with generalized malaise (feeling tired, not well), UPPER ABDOMINAL STOMACH cramps, and fever. One common bacterial cause of gastritis is an organism known as H. Pylori. This can be treated with antibiotics.     REFLUX   TREATMENT There are a number of medicines used to treat reflux including: Antacids.  ZANTAC Proton-pump inhibitors: DEXILANT  HOME CARE INSTRUCTIONS Eat 2-3 hours before going to bed.  Try to reach and maintain a healthy weight. LOSE 10-20 LBS Do not eat just a few very large meals. Instead, eat 4 TO 6 smaller meals throughout the day.  Try to identify foods and beverages that make your symptoms worse, and avoid these.  Avoid tight clothing.  Do not exercise right after eating.   Low-Fat Diet BREADS, CEREALS, PASTA, RICE, DRIED PEAS, AND BEANS These products are high in carbohydrates and most are low  in fat. Therefore, they can be increased in the diet as substitutes for fatty foods. They too, however, contain calories and should not be eaten in excess. Cereals can be eaten for snacks as well as for breakfast.  Include foods that contain fiber (fruits, vegetables, whole grains, and legumes). Research shows that fiber may lower blood cholesterol levels, especially the water-soluble fiber found in fruits, vegetables, oat products, and legumes. FRUITS AND VEGETABLES It is good to eat fruits and vegetables. Besides being sources of fiber, both are rich in vitamins and some minerals. They help you get the daily allowances of these nutrients. Fruits and vegetables can be used for snacks and desserts. MEATS Limit lean meat, chicken, [place], and fish to no more than 6 ounces per day. Beef, Pork, and Lamb Use lean cuts of beef, pork, and lamb. Lean cuts include:  Extra-lean ground beef.  Arm roast.  Sirloin tip.  Center-cut ham.  Round steak.  Loin chops.  Rump roast.  Tenderloin.  Trim all fat off the outside of meats before cooking. It is not necessary to severely decrease the intake of red meat, but lean choices should be made. Lean meat is rich in protein and contains a highly absorbable form of iron. Premenopausal women, in particular, should avoid reducing lean red meat because this could increase the risk for low red blood cells (iron-deficiency anemia).  Chicken and [place] These are good sources of protein. The fat of poultry can be reduced by removing the skin and underlying fat layers before cooking. Chicken and [place] can be substituted for lean red meat in the diet. Poultry should not be fried or covered with high-fat sauces. Fish and Shellfish Fish is a good source of protein. Shellfish contain cholesterol, but they usually are low in saturated fatty acids. The preparation of fish is important. Like chicken and [place], they should not be fried or covered with high-fat sauces. EGGS Egg  whites contain no fat or cholesterol. They can be eaten often. Try 1 to 2 egg whites instead of whole eggs in recipes or use egg substitutes that do not contain yolk.  MILK AND DAIRY PRODUCTS Use skim or 1% milk instead of 2% or whole milk. Decrease whole milk, natural, and processed cheeses. Use nonfat or low-fat (2%) cottage cheese or low-fat cheeses made from vegetable oils. Choose nonfat or low-fat (1 to 2%) yogurt. Experiment with evaporated skim milk in recipes that call for heavy cream. Substitute low-fat yogurt or low-fat cottage cheese for sour cream in dips and salad dressings. Have at least 2 servings of low-fat dairy products, such as 2 glasses of skim (or 1%) milk each day to help get your daily calcium intake.  FATS AND OILS Butterfat, lard, and beef fats are high in saturated fat and cholesterol. These should be avoided.Vegetable fats do not contain cholesterol. AVOID coconut oil, palm oil, and palm kernel oil, WHICH are very high in saturated fats. These should be limited. These fats are  often used in [employer], processed foods, popcorn, oils, and nondairy creamers. Vegetable shortenings and some peanut butters contain hydrogenated oils, which are also saturated fats. Read the labels on these foods and check for saturated vegetable oils.  Desirable liquid vegetable oils are corn oil, cottonseed oil, olive oil, canola oil, safflower oil, soybean oil, and sunflower oil. Peanut oil is not as good, but small amounts are acceptable. Buy a heart-healthy tub margarine that has no partially hydrogenated oils in the ingredients. AVOID Mayonnaise and salad dressings often are made from unsaturated fats.  OTHER EATING TIPS Snacks  Most sweets should be limited as snacks. They tend to be rich in calories and fats, and their caloric content outweighs their nutritional value. Some good choices in snacks are graham crackers, melba toast, soda crackers, bagels (no egg), English muffins, fruits, and  vegetables. These snacks are preferable to snack crackers, [place] fries, and chips. Popcorn should be air-popped or cooked in small amounts of liquid vegetable oil.  Desserts Eat fruit, low-fat yogurt, and fruit ices instead of pastries, cake, and cookies. Sherbet, angel food cake, gelatin dessert, frozen low-fat yogurt, or other frozen products that do not contain saturated fat (pure fruit juice bars, frozen ice pops) are also acceptable.   COOKING METHODS Choose those methods that use little or no fat. They include: Poaching.  Braising.  Steaming.  Grilling.  Baking.  Stir-frying.  Broiling.  Microwaving.  Foods can be cooked in a nonstick pan without added fat, or use a nonfat cooking spray in regular cookware. Limit fried foods and avoid frying in saturated fat. Add moisture to lean meats by using water, broth, cooking wines, and other nonfat or low-fat sauces along with the cooking methods mentioned above. Soups and stews should be chilled after cooking. The fat that forms on top after a few hours in the refrigerator should be skimmed off. When preparing meals, avoid using excess salt. Salt can contribute to raising blood pressure in some people.  EATING AWAY FROM HOME Order entres, potatoes, and vegetables without sauces or butter. When meat exceeds the size of a deck of cards (3 to 4 ounces), the rest can be taken home for another meal. Choose vegetable or fruit salads and ask for low-calorie salad dressings to be served on the side. Use dressings sparingly. Limit high-fat toppings, such as bacon, crumbled eggs, cheese, sunflower seeds, and olives. Ask for heart-healthy tub margarine instead of butter.

## 2014-03-05 NOTE — Transfer of Care (Signed)
Immediate Anesthesia Transfer of Care Note  Patient: Theresa [name]  Procedure(s) Performed: Procedure(s): ESOPHAGOGASTRODUODENOSCOPY (EGD) WITH PROPOFOL (N/A) DUODENAL AND GASTRIC BIOPSY (N/A)  Patient Location: PACU  Anesthesia Type:MAC  Level of Consciousness: awake, alert , patient cooperative and responds to stimulation  Airway & Oxygen Therapy: Patient Spontanous Breathing and Patient connected to face mask oxygen  Post-op Assessment: Report given to PACU RN, Post -op Vital signs reviewed and stable and Patient moving all extremities  Post vital signs: Reviewed and stable  Complications: No apparent anesthesia complications

## 2014-03-05 NOTE — Op Note (Signed)
[facility] [address] [place], [address]   ENDOSCOPY PROCEDURE REPORT  PATIENT: Theresa, [name]  MR#: [id number] BIRTHDATE: 1970/06/30 , 62  yrs. old GENDER: female  ENDOSCOPIST: Barney Drain, MD REFERRED CH:YIFOY Wolfgang Phoenix, M.D. PROCEDURE DATE: 03/07/2014 PROCEDURE:   EGD w/ biopsy  INDICATIONS:dyspepsia. MEDICATIONS: Monitored anesthesia care TOPICAL ANESTHETIC:   Viscous Xylocaine ASA CLASS:  DESCRIPTION OF PROCEDURE:     Physical exam was performed.  Informed consent was obtained from the patient after explaining the benefits, risks, and alternatives to the procedure.  The patient was connected to the monitor and placed in the left lateral position.  Continuous oxygen was provided by nasal cannula and IV medicine administered through an indwelling cannula.  After administration of sedation, the patients esophagus was intubated and the     endoscope was advanced under direct visualization to the second portion of the duodenum.  The scope was removed slowly by carefully examining the color, texture, anatomy, and integrity of the mucosa on the way out.  The patient was recovered in endoscopy and discharged home in satisfactory condition.   ESOPHAGUS: The mucosa of the esophagus appeared normal.   STOMACH: Multiple small polyps were found in the gastric body.  Multiple biopsies was performed.   Moderate non-erosive gastritis (inflammation) was found in the gastric antrum.  Multiple biopsies were performed using cold forceps.   DUODENUM: A small diverticulum was found in the 2nd part of the duodenum. THAT CONTAINS THE AMPULLA.  The duodenal mucosa showed no abnormalities in the 2nd part of the duodenum. COMPLICATIONS: There were no immediate complications.  ENDOSCOPIC IMPRESSION: 1.   DYSPEPSIA DUE TO CONSTIPATION, GASTRITIS, AND GERD 2.   Multiple GASTRIC polyps 3.   MODERATE Non-erosive gastritis 4.   Diverticulum  in the 2nd part of the  duodenum  RECOMMENDATIONS: DRINK WATER TO KEEP YOUR URINE LIGHT YELLOW. FOLLOW A LOW FAT DIET. CONTINUE [place]. BIOPSY WILL BE BACK IN 14 DAYS. FOLLOW UP IN 3 MOS.  REPEAT EXAM: _______________________________ eSignedBarney Drain, MD 03/07/14 1:52 PM  CPT CODES: ICD CODES:  The ICD and CPT codes recommended by this software are interpretations from the data that the clinical staff has captured with the software.  The verification of the translation of this report to the ICD and CPT codes and modifiers is the sole responsibility of the health care institution and practicing physician where this report was generated.  [employer] will not be held responsible for the validity of the ICD and CPT codes included on this report.  AMA assumes no liability for data contained or not contained herein. CPT is a Designer, television/film set of the [employer].

## 2014-03-06 ENCOUNTER — Encounter (HOSPITAL_COMMUNITY): Payer: Self-pay | Admitting: Gastroenterology

## 2014-03-07 ENCOUNTER — Telehealth: Payer: Self-pay

## 2014-03-07 NOTE — Telephone Encounter (Signed)
Pt called and said that she has been having abdominal pain consistently since her EGD on 03/05/2014. She rates the pain at an 8 now. She said she has been unable to eat or drink anything except room temperature water.  I paged Dr. Oneida Alar and she said that there was not anything at the procedure that should have caused this much pain.  She should go to the ED if her pain is severe and she cannot tolerate enough liquids.  If pt wanted to try some Vicous Lidocaine  She may try that and a clear liquid diet for the next 24 hours. Pt  Said she does not want to try the Lidocaine now, she will call back later if she wants it.  Per Dr. Oneida Alar, the order was for:  Vicous Lidocaine 200 ml    Take 1-2 tsp every 4-6 hours as needed not to exceed 8 doses per day. Refill x 1.

## 2014-03-11 NOTE — Telephone Encounter (Signed)
Routing to Dr. Willodean Rosenthal

## 2014-03-11 NOTE — Telephone Encounter (Signed)
Noted  

## 2014-03-11 NOTE — Telephone Encounter (Signed)
I called and informed pt. She is not happy that Dr. Oneida Alar had told her it would be 2 weeks before the biopsies would be available. She was concerned that nothing showed to define her problem. She feels like she needs to change doctors. Said she actually thought she was coming in to see Dr. Gala Romney and is not sure now that got changed.She wanted to know if she could change to him and I told her they do not see each other's patients but I will let my office manager know and see what they could recommend. She finally said, she was busy and if she was interested in doing something she would call back later.

## 2014-03-11 NOTE — Telephone Encounter (Signed)
I called pt and she said she stayed on clear liquids x 2 days and the severe pain has subsided. She said now she is back to the regular abdominal pain in the center of her stomach that brought her in to the office. She was told to come back in 3 months, but feels she needs earlier appt. She said her PCP says this is all coming from IBS. She has been having trouble with her BP being elevated, and I told her she should discuss that with her PCP. She would like to hear from her biopsy results when available.  I told her I will check with Neil Crouch, PA and see what recommendations she has.

## 2014-03-11 NOTE — Telephone Encounter (Signed)
Biopsies showed no celiac disease. Benign fundic gland polyp. Official report to come from Dr. Oneida Alar.  If patient feels she needs to be seen sooner, let's offer her nonurgent [facility] appointment. Thanks!

## 2014-03-12 ENCOUNTER — Encounter (HOSPITAL_COMMUNITY): Payer: Self-pay | Admitting: Anesthesiology

## 2014-03-12 NOTE — Telephone Encounter (Signed)
Routing to Doris 

## 2014-03-12 NOTE — Telephone Encounter (Signed)
Noted  

## 2014-03-12 NOTE — Telephone Encounter (Signed)
I called and informed the pt. She said she is concerned that no one had a plan for the next step. I told her that Dr. Nona Dell always has a plan for pt's when she finds out that their problem is continuing. However, she had just indicated before she heard from Dr. Oneida Alar that she was not happy and Dr. Nona Dell wanted her to be comfortable with her physician. She was told that she could follow up with Dr. Gala Romney or PCP about abdominal pain but pt did not indicate what she wanted to do. I again told her to please follow up with PCP in reference to the abdominal pain. But she just hung up.

## 2014-03-12 NOTE — Telephone Encounter (Signed)
Pt has a 3 month follow up with [facility] on 2/3 at 130 and I have changed her to a RMR patient.

## 2014-03-12 NOTE — Telephone Encounter (Signed)
PLEASE CALL PT. HER BIOPSIES SHOW BENIGN STOMACH POLYPS AND A NORMAL SMALL BOWEL. NO OBVIOUS SOURCE FOR HER ABDOMINAL PAIN HAS BEEN IDENTIFIED. I EXPLAINED TO HER FAMILY THAT I WOULD OUT OF THE COUNTRY SO IT COULD TAKE UP TO 14 DAYS TO GET HER RESULTS BACK. BECAUSE SHE WOULD LIKE TO FIND A NEW DOCTOR, SHE SHOULD SEE DR. Gala Romney OR  DR. Wolfgang Phoenix  FOR CONTINUED WORKUP FOR HER ABDOMINAL PAIN. I HOPE SHE CAN FIND A DOCTOR PATIENT RELATIONSHIP THAT IS MORE PRODUCTIVE.

## 2014-04-23 ENCOUNTER — Encounter: Payer: Self-pay | Admitting: Nurse Practitioner

## 2014-04-23 ENCOUNTER — Ambulatory Visit (INDEPENDENT_AMBULATORY_CARE_PROVIDER_SITE_OTHER): Payer: Managed Care, Other (non HMO) | Admitting: Nurse Practitioner

## 2014-04-23 VITALS — BP 158/96 | Ht 65.0 in | Wt 219.0 lb

## 2014-04-23 DIAGNOSIS — F419 Anxiety disorder, unspecified: Secondary | ICD-10-CM

## 2014-04-23 DIAGNOSIS — I10 Essential (primary) hypertension: Secondary | ICD-10-CM

## 2014-04-23 MED ORDER — LISINOPRIL-HYDROCHLOROTHIAZIDE 10-12.5 MG PO TABS: 1.0000 | ORAL_TABLET | Freq: Every day | ORAL | Status: DC

## 2014-04-23 MED ORDER — CLONAZEPAM 0.5 MG PO TABS: ORAL_TABLET | ORAL | Status: DC

## 2014-04-27 ENCOUNTER — Encounter: Payer: Self-pay | Admitting: Nurse Practitioner

## 2014-04-27 DIAGNOSIS — F419 Anxiety disorder, unspecified: Secondary | ICD-10-CM | POA: Insufficient documentation

## 2014-04-27 DIAGNOSIS — I10 Essential (primary) hypertension: Secondary | ICD-10-CM | POA: Insufficient documentation

## 2014-04-27 NOTE — Progress Notes (Signed)
Subjective:  Presents for c/o elevated BP for about 6 months. No added salt to diet. No regular exercise. Some swelling in the hands, none in the feet. Stress 7-8/10. No CP/ischemic type pain or SOB.   Objective:   BP 158/96 mmHg  Ht 5\' 5"  (1.651 m)  Wt 219 lb (99.338 kg)  BMI 36.44 kg/m2 NAD. Alert, oriented. Cheerful affect. Lungs clear. Heart RRR. Lower extremities no edema. BP on recheck 158/96.  Assessment:  Problem List Items Addressed This Visit      Cardiovascular and Mediastinum   Essential hypertension, benign - Primary   Relevant Medications      LISINOPRIL-HCTZ 10-12.5 MG PO TABS     Other   Anxiety   Relevant Medications      DULoxetine (CYMBALTA) 30 MG capsule        Plan:  Hold on Phentermine until BP is under good control. Continue Cymbalta and Klonopin as directed discussed importance of weight loss, exercise, low sodium diet and stress reduction.  Start Zestoretic as directed. Goal is for BP to be less than 140/90 and as close to 120/80 as possible. Check BP outside office and call if no improvement.  Return in about 3 months (around 07/23/2014).

## 2014-05-17 ENCOUNTER — Encounter: Payer: Self-pay | Admitting: Nurse Practitioner

## 2014-06-05 ENCOUNTER — Telehealth: Payer: Self-pay | Admitting: Gastroenterology

## 2014-06-05 ENCOUNTER — Encounter: Payer: Self-pay | Admitting: Gastroenterology

## 2014-06-05 ENCOUNTER — Ambulatory Visit: Payer: 59 | Admitting: Gastroenterology

## 2014-06-05 NOTE — Telephone Encounter (Signed)
PATIENT WAS A NO SHOW 06/05/14 AND LETTER WAS SENT

## 2014-06-19 ENCOUNTER — Ambulatory Visit (INDEPENDENT_AMBULATORY_CARE_PROVIDER_SITE_OTHER): Payer: 59 | Admitting: Family Medicine

## 2014-06-19 ENCOUNTER — Encounter: Payer: Self-pay | Admitting: Family Medicine

## 2014-06-19 VITALS — BP 122/80 | Temp 98.3°F | Ht 65.0 in | Wt 217.5 lb

## 2014-06-19 DIAGNOSIS — R8290 Unspecified abnormal findings in urine: Secondary | ICD-10-CM | POA: Diagnosis not present

## 2014-06-19 LAB — POCT URINALYSIS DIPSTICK
Spec Grav, UA: <=1.005
pH, UA: 7

## 2014-06-19 MED ORDER — CIPROFLOXACIN HCL 500 MG PO TABS: 500.0000 mg | ORAL_TABLET | Freq: Two times a day (BID) | ORAL | Status: DC

## 2014-06-19 NOTE — Progress Notes (Addendum)
   Subjective:    Patient ID: Theresa [name], female    DOB: [birth year], 44 y.o.   MRN: [id number]  Urinary Tract Infection  This is a new problem. The current episode started 1 to 4 weeks ago. The problem occurs intermittently. The problem has been unchanged. The pain is moderate. There has been no fever. Associated symptoms include nausea and vomiting. Associated symptoms comments: Odor, abdominal pain. She has tried nothing for the symptoms. The treatment provided no relief.    Very sig vomoting the past twenty four hours  Three or four times vomiting  Diarrhea none  constip is a steady aggravation  No fever  zofr pill using when necessary  And dysuria  Review of Systems  Gastrointestinal: Positive for nausea and vomiting.   increased urinary frequency and    Objective:   Physical Exam   alert mild malaise. Vital stable HEENT normal. Lungs clear. Heart regular in rhythm       Assessment & Plan:   impression urinary tract infection. #2 chronic GI concerns with somewhat complicates symptomatology plan antibiotics prescribed. Symptomatic care discussed. If GI symptoms persist recommend contacting GI specialist [facility]

## 2014-06-19 NOTE — Addendum Note (Signed)
Addended by: Jesusita Oka on: 06/19/2014 02:47 PM   Modules accepted: Orders

## 2014-06-21 LAB — URINE CULTURE
Colony Count: NO GROWTH
Organism ID, Bacteria: NO GROWTH

## 2014-07-04 ENCOUNTER — Encounter: Payer: Self-pay | Admitting: Nurse Practitioner

## 2014-07-05 ENCOUNTER — Encounter: Payer: Self-pay | Admitting: Nurse Practitioner

## 2014-07-05 ENCOUNTER — Other Ambulatory Visit: Payer: Self-pay | Admitting: Nurse Practitioner

## 2014-07-05 DIAGNOSIS — R5383 Other fatigue: Secondary | ICD-10-CM

## 2014-07-05 DIAGNOSIS — Z13220 Encounter for screening for lipoid disorders: Secondary | ICD-10-CM

## 2014-07-05 DIAGNOSIS — R7301 Impaired fasting glucose: Secondary | ICD-10-CM

## 2014-07-05 DIAGNOSIS — I10 Essential (primary) hypertension: Secondary | ICD-10-CM

## 2014-07-13 LAB — CBC WITH DIFFERENTIAL/PLATELET
Basophils Absolute: 0 10*3/uL (ref 0.0–0.2)
Basos: 1 %
Eos: 4 %
Eosinophils Absolute: 0.2 10*3/uL (ref 0.0–0.4)
HCT: 37.4 % (ref 34.0–46.6)
Hemoglobin: 12.6 g/dL (ref 11.1–15.9)
Immature Grans (Abs): 0 10*3/uL (ref 0.0–0.1)
Immature Granulocytes: 0 %
Lymphocytes Absolute: 1.5 10*3/uL (ref 0.7–3.1)
Lymphs: 26 %
MCH: 30.8 pg (ref 26.6–33.0)
MCHC: 33.7 g/dL (ref 31.5–35.7)
MCV: 91 fL (ref 79–97)
Monocytes Absolute: 0.6 10*3/uL (ref 0.1–0.9)
Monocytes: 11 %
Neutrophils Absolute: 3.4 10*3/uL (ref 1.4–7.0)
Neutrophils Relative %: 58 %
Platelets: 283 10*3/uL (ref 150–379)
RBC: 4.09 x10E6/uL (ref 3.77–5.28)
RDW: 13.2 % (ref 12.3–15.4)
WBC: 5.7 10*3/uL (ref 3.4–10.8)

## 2014-07-13 LAB — BASIC METABOLIC PANEL
BUN/Creatinine Ratio: 18 (ref 9–23)
BUN: 14 mg/dL (ref 6–24)
CO2: 25 mmol/L (ref 18–29)
Calcium: 9.5 mg/dL (ref 8.7–10.2)
Chloride: 102 mmol/L (ref 97–108)
Creatinine, Ser: 0.8 mg/dL (ref 0.57–1.00)
GFR calc Af Amer: 104 mL/min/{1.73_m2} (ref >59)
GFR calc non Af Amer: 91 mL/min/{1.73_m2} (ref >59)
Glucose: 120 mg/dL — ABNORMAL HIGH (ref 65–99)
Potassium: 4.7 mmol/L (ref 3.5–5.2)
Sodium: 141 mmol/L (ref 134–144)

## 2014-07-13 LAB — HEMOGLOBIN A1C
Est. average glucose Bld gHb Est-mCnc: 123 mg/dL
Hgb A1c MFr Bld: 5.9 % — ABNORMAL HIGH (ref 4.8–5.6)

## 2014-07-13 LAB — HEPATIC FUNCTION PANEL
ALT: 34 IU/L — ABNORMAL HIGH (ref 0–32)
AST: 23 IU/L (ref 0–40)
Albumin: 4.2 g/dL (ref 3.5–5.5)
Alkaline Phosphatase: 76 IU/L (ref 39–117)
Bilirubin Total: 0.5 mg/dL (ref 0.0–1.2)
Bilirubin, Direct: 0.14 mg/dL (ref 0.00–0.40)
Total Protein: 6.7 g/dL (ref 6.0–8.5)

## 2014-07-13 LAB — LIPID PANEL
Chol/HDL Ratio: 2.6 ratio units (ref 0.0–4.4)
Cholesterol, Total: 172 mg/dL (ref 100–199)
HDL: 66 mg/dL (ref >39)
LDL Calculated: 91 mg/dL (ref 0–99)
Triglycerides: 75 mg/dL (ref 0–149)
VLDL Cholesterol Cal: 15 mg/dL (ref 5–40)

## 2014-07-13 LAB — VITAMIN D 25 HYDROXY (VIT D DEFICIENCY, FRACTURES): Vit D, 25-Hydroxy: 21.3 ng/mL — ABNORMAL LOW (ref 30.0–100.0)

## 2014-07-13 LAB — TSH: TSH: 2.09 u[IU]/mL (ref 0.450–4.500)

## 2014-10-21 ENCOUNTER — Encounter: Payer: Self-pay | Admitting: Nurse Practitioner

## 2014-10-24 ENCOUNTER — Other Ambulatory Visit: Payer: Self-pay | Admitting: Nurse Practitioner

## 2014-10-24 MED ORDER — CLONAZEPAM 0.5 MG PO TABS: ORAL_TABLET | ORAL | Status: DC

## 2014-10-31 ENCOUNTER — Ambulatory Visit (HOSPITAL_COMMUNITY): Payer: Self-pay | Admitting: Psychiatry

## 2014-11-14 ENCOUNTER — Telehealth: Payer: Self-pay | Admitting: Family Medicine

## 2014-11-14 NOTE — Telephone Encounter (Signed)
Recommend office visit to discuss.

## 2014-11-14 NOTE — Telephone Encounter (Signed)
For Theresa [name]- patient wants something stronger for her nerves states recent medication not helping. She states crying a lot more, cant sleep, angry a lot.She is currently taking Klonopin 0.5 mg.

## 2014-11-14 NOTE — Telephone Encounter (Signed)
Telephone call no answer 

## 2014-11-18 NOTE — Telephone Encounter (Signed)
Patient advised she needs office visit to discuss. Patient declined appt at this time.

## 2014-12-31 ENCOUNTER — Ambulatory Visit (INDEPENDENT_AMBULATORY_CARE_PROVIDER_SITE_OTHER): Payer: 59 | Admitting: Psychiatry

## 2014-12-31 ENCOUNTER — Encounter (HOSPITAL_COMMUNITY): Payer: Self-pay | Admitting: Psychiatry

## 2014-12-31 VITALS — BP 112/81 | HR 88 | Ht 65.0 in | Wt 203.0 lb

## 2014-12-31 DIAGNOSIS — F419 Anxiety disorder, unspecified: Secondary | ICD-10-CM | POA: Diagnosis not present

## 2014-12-31 DIAGNOSIS — F329 Major depressive disorder, single episode, unspecified: Secondary | ICD-10-CM | POA: Diagnosis not present

## 2014-12-31 DIAGNOSIS — F411 Generalized anxiety disorder: Secondary | ICD-10-CM

## 2014-12-31 MED ORDER — DULOXETINE HCL 60 MG PO CPEP: 60.0000 mg | ORAL_CAPSULE | Freq: Every day | ORAL | Status: DC

## 2014-12-31 MED ORDER — TRAZODONE HCL 50 MG PO TABS: 50.0000 mg | ORAL_TABLET | Freq: Every day | ORAL | Status: DC

## 2014-12-31 MED ORDER — CLONAZEPAM 0.5 MG PO TABS: 0.5000 mg | ORAL_TABLET | Freq: Two times a day (BID) | ORAL | Status: DC
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
## 2015-01-02 ENCOUNTER — Ambulatory Visit: Payer: Managed Care, Other (non HMO) | Admitting: Nurse Practitioner

## 2015-01-16 ENCOUNTER — Telehealth: Payer: Self-pay | Admitting: Family Medicine

## 2015-01-16 NOTE — Telephone Encounter (Signed)
Pt is needing a letter from you stating what different "aliment" you have seen her for Over the last several years (starting 2007 if possible) to give to her lawyer for her  Divorce proceedings.   Call patient directly if you have any questions

## 2015-01-16 NOTE — Telephone Encounter (Signed)
This was supposed to go to [place], forwarded to her

## 2015-01-21 ENCOUNTER — Encounter (HOSPITAL_COMMUNITY): Payer: Self-pay | Admitting: Psychiatry

## 2015-01-21 ENCOUNTER — Ambulatory Visit (INDEPENDENT_AMBULATORY_CARE_PROVIDER_SITE_OTHER): Payer: 59 | Admitting: Psychiatry

## 2015-01-21 DIAGNOSIS — F411 Generalized anxiety disorder: Secondary | ICD-10-CM
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
## 2015-01-21 NOTE — Patient Instructions (Signed)
Discussed orally 

## 2015-02-06 ENCOUNTER — Ambulatory Visit (HOSPITAL_COMMUNITY): Payer: Self-pay | Admitting: Psychiatry

## 2015-02-10 ENCOUNTER — Ambulatory Visit (INDEPENDENT_AMBULATORY_CARE_PROVIDER_SITE_OTHER): Payer: 59 | Admitting: Nurse Practitioner

## 2015-02-10 ENCOUNTER — Encounter: Payer: Self-pay | Admitting: Nurse Practitioner

## 2015-02-10 ENCOUNTER — Encounter: Payer: Self-pay | Admitting: Family Medicine

## 2015-02-10 VITALS — BP 132/80 | Ht 65.0 in | Wt 208.2 lb

## 2015-02-10 DIAGNOSIS — N951 Menopausal and female climacteric states: Secondary | ICD-10-CM

## 2015-02-10 DIAGNOSIS — Z1371 Encounter for nonprocreative screening for genetic disease carrier status: Secondary | ICD-10-CM | POA: Insufficient documentation

## 2015-02-10 MED ORDER — ESTRADIOL 0.05 MG/24HR TD PTTW: 1.0000 | MEDICATED_PATCH | TRANSDERMAL | Status: DC

## 2015-02-10 NOTE — Patient Instructions (Signed)
Black cohosh Soy  Flaxseed

## 2015-02-10 NOTE — Progress Notes (Signed)
Subjective:  Presents for complaints of hot flashes over the past year. Now having significant night sweats which makes it difficult to sleep. Sees Dr. Ross for her anxiety and other sleep issues. Had a hysterectomy in 2004, still has her right ovary. Has not been seen for her migraines in years but will occasionally have a migraine with aura which is responsive to OTC meds and caffeinated soda. Note patient has had genetic testing because of her mother's history, negative BRCA.   Objective:   BP 132/80 mmHg  Ht 5' 5" (1.651 m)  Wt 208 lb 3.2 oz (94.439 kg)  BMI 34.65 kg/m2 NAD. Alert, oriented. Thoughts logical coherent and relevant. Emotional at times during office visit when discussing her family. Lungs clear. Heart regular rate rhythm.  Assessment: Perimenopausal vasomotor symptoms  Plan:  Meds ordered this encounter  Medications  . estradiol (VIVELLE-DOT) 0.05 MG/24HR patch    Sig: Place 1 patch (0.05 mg total) onto the skin 2 (two) times a week.    Dispense:  8 patch    Refill:  12    Order Specific Question:  Supervising Provider    Answer:  LUKING, WILLIAM S [[id number]]   Discussed risk and benefits associated with HRT. Because of her history of migraine with aura, will start with a transdermal estrogen replacement. Also recommend natural supplements. Also reminded patient about preventive health physicals and mammograms. Return if symptoms worsen or fail to improve.   

## 2015-02-12 ENCOUNTER — Encounter: Payer: Self-pay | Admitting: Nurse Practitioner

## 2015-02-13 NOTE — Telephone Encounter (Signed)
Letter was done on 10/13 and given to Gwendalyn Ege to send to patient.

## 2015-02-17 ENCOUNTER — Telehealth (HOSPITAL_COMMUNITY): Payer: Self-pay | Admitting: *Deleted

## 2015-02-17 NOTE — Telephone Encounter (Signed)
Patient came in today, she need refill on Klonopin or Adivan for anxiety.

## 2015-02-17 NOTE — Telephone Encounter (Signed)
Pt came by office and stating that she do not know where her printed script for her Klonopin is. Per pt, she have been dealing with a lot of legal paperwork and can not find it. Per pt, she would like to see if Dr. Harrington Challenger could print another script for her due to going to court tomorrow. Pt number [phone].

## 2015-02-18 ENCOUNTER — Other Ambulatory Visit (HOSPITAL_COMMUNITY): Payer: Self-pay | Admitting: Psychiatry

## 2015-02-18 MED ORDER — CLONAZEPAM 0.5 MG PO TABS: 0.5000 mg | ORAL_TABLET | Freq: Two times a day (BID) | ORAL | Status: DC

## 2015-02-18 NOTE — Telephone Encounter (Signed)
Printed. She needs to make appt

## 2015-02-19 NOTE — Telephone Encounter (Signed)
Called pt back on number provided when last called and phone kept ringing and no voicemail. Called the second time and no one picked up and phone kept ringing with no voicemail

## 2015-02-20 NOTE — Telephone Encounter (Signed)
Called pt and there is no voicemail to leave message. Will try calling at another time

## 2015-02-21 ENCOUNTER — Telehealth: Payer: Self-pay | Admitting: Family Medicine

## 2015-02-21 ENCOUNTER — Telehealth (HOSPITAL_COMMUNITY): Payer: Self-pay | Admitting: *Deleted

## 2015-02-21 ENCOUNTER — Encounter: Payer: Self-pay | Admitting: Family Medicine

## 2015-02-21 ENCOUNTER — Ambulatory Visit (INDEPENDENT_AMBULATORY_CARE_PROVIDER_SITE_OTHER): Payer: 59 | Admitting: Family Medicine

## 2015-02-21 VITALS — BP 132/94 | Ht 65.0 in | Wt 206.0 lb

## 2015-02-21 DIAGNOSIS — I10 Essential (primary) hypertension: Secondary | ICD-10-CM

## 2015-02-21 DIAGNOSIS — Z029 Encounter for administrative examinations, unspecified: Secondary | ICD-10-CM

## 2015-02-21 MED ORDER — LISINOPRIL-HYDROCHLOROTHIAZIDE 20-25 MG PO TABS: 1.0000 | ORAL_TABLET | Freq: Every day | ORAL | Status: DC

## 2015-02-21 NOTE — Patient Instructions (Signed)
Hypertension Hypertension, commonly called high blood pressure, is when the force of blood pumping through your arteries is too strong. Your arteries are the blood vessels that carry blood from your heart throughout your body. A blood pressure reading consists of a higher number over a lower number, such as 110/72. The higher number (systolic) is the pressure inside your arteries when your heart pumps. The lower number (diastolic) is the pressure inside your arteries when your heart relaxes. Ideally you want your blood pressure below 120/80. Hypertension forces your heart to work harder to pump blood. Your arteries may become narrow or stiff. Having untreated or uncontrolled hypertension can cause heart attack, stroke, kidney disease, and other problems. RISK FACTORS Some risk factors for high blood pressure are controllable. Others are not.  Risk factors you cannot control include:   Race. You may be at higher risk if you are African American.  Age. Risk increases with age.  Gender. Men are at higher risk than women before age 45 years. After age 65, women are at higher risk than men. Risk factors you can control include:  Not getting enough exercise or physical activity.  Being overweight.  Getting too much fat, sugar, calories, or salt in your diet.  Drinking too much alcohol. SIGNS AND SYMPTOMS Hypertension does not usually cause signs or symptoms. Extremely high blood pressure (hypertensive crisis) may cause headache, anxiety, shortness of breath, and nosebleed. DIAGNOSIS To check if you have hypertension, your health care provider will measure your blood pressure while you are seated, with your arm held at the level of your heart. It should be measured at least twice using the same arm. Certain conditions can cause a difference in blood pressure between your right and left arms. A blood pressure reading that is higher than normal on one occasion does not mean that you need treatment. If  it is not clear whether you have high blood pressure, you may be asked to return on a different day to have your blood pressure checked again. Or, you may be asked to monitor your blood pressure at home for 1 or more weeks. TREATMENT Treating high blood pressure includes making lifestyle changes and possibly taking medicine. Living a healthy lifestyle can help lower high blood pressure. You may need to change some of your habits. Lifestyle changes may include:  Following the DASH diet. This diet is high in fruits, vegetables, and whole grains. It is low in salt, red meat, and added sugars.  Keep your sodium intake below 2,300 mg per day.  Getting at least 30-45 minutes of aerobic exercise at least 4 times per week.  Losing weight if necessary.  Not smoking.  Limiting alcoholic beverages.  Learning ways to reduce stress. Your health care provider may prescribe medicine if lifestyle changes are not enough to get your blood pressure under control, and if one of the following is true:  You are 18-59 years of age and your systolic blood pressure is above 140.  You are 60 years of age or older, and your systolic blood pressure is above 150.  Your diastolic blood pressure is above 90.  You have diabetes, and your systolic blood pressure is over 140 or your diastolic blood pressure is over 90.  You have kidney disease and your blood pressure is above 140/90.  You have heart disease and your blood pressure is above 140/90. Your personal target blood pressure may vary depending on your medical conditions, your age, and other factors. HOME CARE INSTRUCTIONS    Have your blood pressure rechecked as directed by your health care provider.   Take medicines only as directed by your health care provider. Follow the directions carefully. Blood pressure medicines must be taken as prescribed. The medicine does not work as well when you skip doses. Skipping doses also puts you at risk for  problems.  Do not smoke.   Monitor your blood pressure at home as directed by your health care provider. SEEK MEDICAL CARE IF:   You think you are having a reaction to medicines taken.  You have recurrent headaches or feel dizzy.  You have swelling in your ankles.  You have trouble with your vision. SEEK IMMEDIATE MEDICAL CARE IF:  You develop a severe headache or confusion.  You have unusual weakness, numbness, or feel faint.  You have severe chest or abdominal pain.  You vomit repeatedly.  You have trouble breathing. MAKE SURE YOU:   Understand these instructions.  Will watch your condition.  Will get help right away if you are not doing well or get worse.   This information is not intended to replace advice given to you by your health care provider. Make sure you discuss any questions you have with your health care provider.   Document Released: 04/19/2005 Document Revised: 09/03/2014 Document Reviewed: 02/09/2013 Elsevier Interactive Patient Education 2016 [employer] DASH Eating Plan DASH stands for "Dietary Approaches to Stop Hypertension." The DASH eating plan is a healthy eating plan that has been shown to reduce high blood pressure (hypertension). Additional health benefits may include reducing the risk of type 2 diabetes mellitus, heart disease, and stroke. The DASH eating plan may also help with weight loss. WHAT DO I NEED TO KNOW ABOUT THE DASH EATING PLAN? For the DASH eating plan, you will follow these general guidelines:  Choose foods with a percent daily value for sodium of less than 5% (as listed on the food label).  Use salt-free seasonings or herbs instead of table salt or sea salt.  Check with your health care provider or pharmacist before using salt substitutes.  Eat lower-sodium products, often labeled as "lower sodium" or "no salt added."  Eat fresh foods.  Eat more vegetables, fruits, and low-fat dairy products.  Choose whole grains.  Look for the word "whole" as the first word in the ingredient list.  Choose fish and skinless chicken or turkey more often than red meat. Limit fish, poultry, and meat to 6 oz (170 g) each day.  Limit sweets, desserts, sugars, and sugary drinks.  Choose heart-healthy fats.  Limit cheese to 1 oz (28 g) per day.  Eat more home-cooked food and less restaurant, buffet, and fast food.  Limit fried foods.  Cook foods using methods other than frying.  Limit canned vegetables. If you do use them, rinse them well to decrease the sodium.  When eating at a restaurant, ask that your food be prepared with less salt, or no salt if possible. WHAT FOODS CAN I EAT? Seek help from a dietitian for individual calorie needs. Grains Whole grain or whole wheat bread. Brown rice. Whole grain or whole wheat pasta. Quinoa, bulgur, and whole grain cereals. Low-sodium cereals. Corn or whole wheat flour tortillas. Whole grain cornbread. Whole grain crackers. Low-sodium crackers. Vegetables Fresh or frozen vegetables (raw, steamed, roasted, or grilled). Low-sodium or reduced-sodium tomato and vegetable juices. Low-sodium or reduced-sodium tomato sauce and paste. Low-sodium or reduced-sodium canned vegetables.  Fruits All fresh, canned (in natural juice), or frozen fruits. Meat and Other   Protein Products Ground beef (85% or leaner), grass-fed beef, or beef trimmed of fat. Skinless chicken or turkey. Ground chicken or turkey. Pork trimmed of fat. All fish and seafood. Eggs. Dried beans, peas, or lentils. Unsalted nuts and seeds. Unsalted canned beans. Dairy Low-fat dairy products, such as skim or 1% milk, 2% or reduced-fat cheeses, low-fat ricotta or cottage cheese, or plain low-fat yogurt. Low-sodium or reduced-sodium cheeses. Fats and Oils Tub margarines without trans fats. Light or reduced-fat mayonnaise and salad dressings (reduced sodium). Avocado. Safflower, olive, or canola oils. Natural peanut or almond  butter. Other Unsalted popcorn and pretzels. The items listed above may not be a complete list of recommended foods or beverages. Contact your dietitian for more options. WHAT FOODS ARE NOT RECOMMENDED? Grains White bread. White pasta. White rice. Refined cornbread. Bagels and croissants. Crackers that contain trans fat. Vegetables Creamed or fried vegetables. Vegetables in a cheese sauce. Regular canned vegetables. Regular canned tomato sauce and paste. Regular tomato and vegetable juices. Fruits Dried fruits. Canned fruit in light or heavy syrup. Fruit juice. Meat and Other Protein Products Fatty cuts of meat. Ribs, chicken wings, bacon, sausage, bologna, salami, chitterlings, fatback, hot dogs, bratwurst, and packaged luncheon meats. Salted nuts and seeds. Canned beans with salt. Dairy Whole or 2% milk, cream, half-and-half, and cream cheese. Whole-fat or sweetened yogurt. Full-fat cheeses or blue cheese. Nondairy creamers and whipped toppings. Processed cheese, cheese spreads, or cheese curds. Condiments Onion and garlic salt, seasoned salt, table salt, and sea salt. Canned and packaged gravies. Worcestershire sauce. Tartar sauce. Barbecue sauce. Teriyaki sauce. Soy sauce, including reduced sodium. Steak sauce. Fish sauce. Oyster sauce. Cocktail sauce. Horseradish. Ketchup and mustard. Meat flavorings and tenderizers. Bouillon cubes. Hot sauce. Tabasco sauce. Marinades. Taco seasonings. Relishes. Fats and Oils Butter, stick margarine, lard, shortening, ghee, and bacon fat. Coconut, palm kernel, or palm oils. Regular salad dressings. Other Pickles and olives. Salted popcorn and pretzels. The items listed above may not be a complete list of foods and beverages to avoid. Contact your dietitian for more information. WHERE CAN I FIND MORE INFORMATION? National [employer], [employer], [employer]: www.nhlbi.nih.gov/health/health-topics/topics/dash/   This information is not intended to replace  advice given to you by your health care provider. Make sure you discuss any questions you have with your health care provider.   Document Released: 04/08/2011 Document Revised: 05/10/2014 Document Reviewed: 02/21/2013 Elsevier Interactive Patient Education 2016 [employer]  

## 2015-02-21 NOTE — Progress Notes (Signed)
   Subjective:    Patient ID: Theresa [name], female    DOB: 12/10/1970, 44 y.o.   MRN: [id number]  Hypertension This is a chronic problem. Pertinent negatives include no chest pain. (Tightness in chest, vomiting and headache 2 days ago.) Treatments tried: lisinopril- hctz. There are no compliance problems (walks all the time, eats healthy, has lost some weight, little red meat, not a lot of salt.).    patient states she got in argument earlier this week with family members afterwards felt headache slight chest tightness nausea no numbness or tingling. Chest discomfort went away once she settled down. Does not smoke has history of hypertension going through divorce. Declines flu vaccine. Patient denies chest tightness or pressure with walking  Review of Systems  Constitutional: Negative for activity change, appetite change and fatigue.  HENT: Negative for congestion.   Respiratory: Negative for cough.   Cardiovascular: Negative for chest pain.  Gastrointestinal: Negative for abdominal pain.  Endocrine: Negative for polydipsia and polyphagia.  Neurological: Negative for weakness.  Psychiatric/Behavioral: Negative for confusion.   I do not feel the patient having cardiac symptoms.     Objective:   Physical Exam  Constitutional: She appears well-nourished. No distress.  Cardiovascular: Normal rate, regular rhythm and normal heart sounds.   No murmur heard. Pulmonary/Chest: Effort normal and breath sounds normal. No respiratory distress.  Musculoskeletal: She exhibits no edema.  Lymphadenopathy:    She has no cervical adenopathy.  Neurological: She is alert. She exhibits normal muscle tone.  Psychiatric: Her behavior is normal.  Vitals reviewed.         Assessment & Plan:  HTN subpar control increase the dose of medication. Patient will follow blood pressures intermittently. Follow-up again in 4 weeks time. Follow-up sooner problems. Advised low-salt diet regular physical  activity. Stress relaxation techniques. Follow-up sooner if any problems chest pressures or worse

## 2015-02-21 NOTE — Telephone Encounter (Signed)
More than likely we will be going up on her medicine but with her symptoms of chest tightness it would be in her best interest to be seen today

## 2015-02-21 NOTE — Telephone Encounter (Signed)
Spoke with patient and informed her per Dr.Scott Luking-More than likely we will be going up on her medicine but with her symptoms of chest tightness it would be in her best interest to be seen today. Patient transferred to front desk to be scheduled for office visit today.

## 2015-02-21 NOTE — Telephone Encounter (Signed)
Pt called stating that her bp is 136/94. Pt states that she is not upset at the time. Pt is wanting to know if the bp meds that she is on needs to be increased.

## 2015-02-21 NOTE — Telephone Encounter (Signed)
Spoke with patient to discuss symptoms. Patient stated that her BP has been elevated for the past few days. She has c/o chest tightness, visual changes(sensitivity to light), frequent headaches, nausea and vomiting. Wants to know if she needs to increase her BP medication.  Patient only has record of BP today. Says she did not feel well enough to take on other days but it felt elevated. Please advise?  BP reading today: 136/94 HR:76

## 2015-02-24 NOTE — Telephone Encounter (Signed)
Called pt to inform her that provider prined script for her to pick up. Per pt, she found her script that she thought she lost and don't need the script that Dr. Harrington Challenger printed right now.

## 2015-03-03 ENCOUNTER — Encounter (HOSPITAL_COMMUNITY): Payer: Self-pay | Admitting: Psychiatry

## 2015-03-03 ENCOUNTER — Ambulatory Visit (INDEPENDENT_AMBULATORY_CARE_PROVIDER_SITE_OTHER): Payer: 59 | Admitting: Psychiatry

## 2015-03-03 VITALS — BP 114/89 | HR 84 | Ht 65.0 in | Wt 199.2 lb

## 2015-03-03 DIAGNOSIS — F411 Generalized anxiety disorder: Secondary | ICD-10-CM | POA: Diagnosis not present

## 2015-03-03 MED ORDER — DULOXETINE HCL 60 MG PO CPEP: 60.0000 mg | ORAL_CAPSULE | Freq: Every day | ORAL | Status: DC

## 2015-03-03 MED ORDER — TEMAZEPAM 15 MG PO CAPS: 15.0000 mg | ORAL_CAPSULE | Freq: Every evening | ORAL | Status: DC | PRN
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
## 2015-03-04 ENCOUNTER — Encounter (HOSPITAL_COMMUNITY): Payer: Self-pay | Admitting: Psychiatry

## 2015-03-04 ENCOUNTER — Ambulatory Visit (INDEPENDENT_AMBULATORY_CARE_PROVIDER_SITE_OTHER): Payer: 59 | Admitting: Psychiatry

## 2015-03-04 DIAGNOSIS — F411 Generalized anxiety disorder: Secondary | ICD-10-CM | POA: Diagnosis not present

## 2015-03-04 NOTE — Patient Instructions (Signed)
Discussed orally 
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
## 2015-03-07 ENCOUNTER — Telehealth (HOSPITAL_COMMUNITY): Payer: Self-pay | Admitting: *Deleted
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
## 2015-03-10 ENCOUNTER — Ambulatory Visit (HOSPITAL_COMMUNITY): Payer: Self-pay | Admitting: Psychiatry

## 2015-03-11 ENCOUNTER — Encounter (HOSPITAL_COMMUNITY): Payer: Self-pay | Admitting: Psychiatry

## 2015-03-11 ENCOUNTER — Ambulatory Visit (INDEPENDENT_AMBULATORY_CARE_PROVIDER_SITE_OTHER): Payer: 59 | Admitting: Psychiatry

## 2015-03-11 DIAGNOSIS — F411 Generalized anxiety disorder: Secondary | ICD-10-CM
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
## 2015-03-11 NOTE — Patient Instructions (Signed)
Discussed orally 

## 2015-03-31 ENCOUNTER — Ambulatory Visit (INDEPENDENT_AMBULATORY_CARE_PROVIDER_SITE_OTHER): Payer: 59 | Admitting: Psychiatry

## 2015-03-31 ENCOUNTER — Encounter (HOSPITAL_COMMUNITY): Payer: Self-pay | Admitting: Psychiatry

## 2015-03-31 DIAGNOSIS — F411 Generalized anxiety disorder: Secondary | ICD-10-CM | POA: Diagnosis not present

## 2015-03-31 NOTE — Patient Instructions (Signed)
Discussed orally 
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
## 2015-04-02 ENCOUNTER — Telehealth (HOSPITAL_COMMUNITY): Payer: Self-pay | Admitting: *Deleted

## 2015-04-14 ENCOUNTER — Ambulatory Visit (HOSPITAL_COMMUNITY): Payer: Self-pay | Admitting: Psychiatry

## 2015-04-15 ENCOUNTER — Ambulatory Visit (HOSPITAL_COMMUNITY): Payer: Self-pay | Admitting: Psychiatry

## 2015-04-29 ENCOUNTER — Ambulatory Visit (HOSPITAL_COMMUNITY): Payer: Self-pay | Admitting: Psychiatry

## 2015-05-23 ENCOUNTER — Ambulatory Visit: Payer: 59 | Admitting: Family Medicine

## 2015-07-25 ENCOUNTER — Encounter: Payer: Self-pay | Admitting: Nurse Practitioner

## 2015-07-25 ENCOUNTER — Ambulatory Visit (INDEPENDENT_AMBULATORY_CARE_PROVIDER_SITE_OTHER): Payer: Self-pay | Admitting: Nurse Practitioner

## 2015-07-25 VITALS — BP 108/76 | Temp 98.9°F | Ht 65.0 in | Wt 202.1 lb

## 2015-07-25 DIAGNOSIS — N390 Urinary tract infection, site not specified: Secondary | ICD-10-CM

## 2015-07-25 LAB — POCT URINALYSIS DIPSTICK
Blood, UA: NEGATIVE
Spec Grav, UA: 1.025
pH, UA: 5

## 2015-07-25 MED ORDER — CIPROFLOXACIN HCL 500 MG PO TABS: 500.0000 mg | ORAL_TABLET | Freq: Two times a day (BID) | ORAL | Status: DC

## 2015-07-26 ENCOUNTER — Encounter: Payer: Self-pay | Admitting: Nurse Practitioner

## 2015-07-26 NOTE — Progress Notes (Signed)
Subjective:  Presents for possible lateral infection that began about a week ago. Some chills, possible fever. Dysuria with urgency and frequency. Nausea but no vomiting. No pelvic pain. No vaginal discharge. No new sexual partners, currently separated from her husband.   Objective:   BP 108/76 mmHg  Temp(Src) 98.9 F (37.2 C) (Oral)  Ht 5\' 5"  (1.651 m)  Wt 202 lb 2 oz (91.683 kg)  BMI 33.64 kg/m2 NAD. Alert, oriented. Lungs clear. Heart regular rate rhythm. Abdomen soft nondistended nontender. Mild left CVA/flank tenderness on exam. Results for orders placed or performed in visit on 07/25/15  POCT urinalysis dipstick  Result Value Ref Range   Color, UA Yellow    Clarity, UA Cloudy    Glucose, UA     Bilirubin, UA     Ketones, UA     Spec Grav, UA 1.025    Blood, UA Negative    pH, UA 5.0    Protein, UA     Urobilinogen, UA     Nitrite, UA     Leukocytes, UA large (3+) (A) Negative   Urine micro-rare WBC, rare bacteria otherwise negative.  Assessment: UTI (lower urinary tract infection) - Plan: POCT urinalysis dipstick  Plan:  Meds ordered this encounter  Medications  . ciprofloxacin (CIPRO) 500 MG tablet    Sig: Take 1 tablet (500 mg total) by mouth 2 (two) times daily.    Dispense:  14 tablet    Refill:  0    Order Specific Question:  Supervising Provider    Answer:  Maggie Font   Because patient is uninsured, hold on urine culture. Start Cipro as directed. May take AZO for 48 hours then discontinue. Warning signs reviewed. Call back in 72 hours if no improvement, sooner if worse.

## 2015-08-05 ENCOUNTER — Other Ambulatory Visit: Payer: Self-pay | Admitting: Nurse Practitioner

## 2015-08-05 NOTE — Telephone Encounter (Signed)
Spoke with patient and patient stated that she was not taking this medication for awhile due to not having insurance since she was going through a divorce. Has not seen Dr.Ross duet to no insurance. Now has insurance and would like refills. States very stressed and needs something to help with stress.

## 2015-08-05 NOTE — Telephone Encounter (Signed)
Nurses: need clarification. Last Rx written by Dr. Harrington Challenger. Also she reported at last visit that she was not taking this.Marland Kitchen

## 2015-08-26 ENCOUNTER — Telehealth: Payer: Self-pay | Admitting: Nurse Practitioner

## 2015-08-26 ENCOUNTER — Other Ambulatory Visit: Payer: Self-pay | Admitting: Nurse Practitioner

## 2015-08-26 MED ORDER — CEPHALEXIN 500 MG PO CAPS: 500.0000 mg | ORAL_CAPSULE | Freq: Three times a day (TID) | ORAL | Status: DC

## 2015-08-26 NOTE — Telephone Encounter (Signed)
Trying to keep cost down because she is without insurance. Will call in another antibiotic. Call back in 5-7 days if no better, sooner if worse.

## 2015-08-26 NOTE — Telephone Encounter (Signed)
Did her symptoms resolve on Cipro?

## 2015-08-26 NOTE — Telephone Encounter (Signed)
No they did not clear up completley and she tried to treat  Herself but it has not gone away

## 2015-08-26 NOTE — Telephone Encounter (Signed)
Pt seen on the 24th of Mar for UTI, she states she is still having symptoms Has been trying some OTC but still not going away. You told her to call back  If the first anti biotic didn't work but she just never got to it. She is still having burning  With urination, pain and she feels its still getting worse than better.   She states she can't take time off work with just starting this job if you could just call In another round please   [employer]

## 2015-08-27 NOTE — Telephone Encounter (Signed)
Notified patient trying to keep cost down because she is without insurance. Will call in another antibiotic. Call back in 5-7 days if no better, sooner if worse. Patient verbalized understanding.

## 2015-09-08 ENCOUNTER — Other Ambulatory Visit: Payer: Self-pay | Admitting: Gastroenterology

## 2015-09-08 NOTE — Telephone Encounter (Signed)
Patient needs ov, last seen in 2015. Refill for 30 day supply of Dexilant.

## 2015-09-09 ENCOUNTER — Encounter: Payer: Self-pay | Admitting: Gastroenterology

## 2015-09-09 NOTE — Telephone Encounter (Signed)
APPT MADE AND LETTER SENT  °

## 2015-10-06 ENCOUNTER — Ambulatory Visit: Payer: Self-pay | Admitting: Gastroenterology

## 2015-10-21 ENCOUNTER — Encounter: Payer: Self-pay | Admitting: Women's Health

## 2015-10-21 ENCOUNTER — Ambulatory Visit (INDEPENDENT_AMBULATORY_CARE_PROVIDER_SITE_OTHER): Payer: BLUE CROSS/BLUE SHIELD | Admitting: Women's Health

## 2015-10-21 VITALS — BP 124/80 | Ht 65.0 in | Wt 207.0 lb

## 2015-10-21 DIAGNOSIS — L298 Other pruritus: Secondary | ICD-10-CM | POA: Diagnosis not present

## 2015-10-21 DIAGNOSIS — N898 Other specified noninflammatory disorders of vagina: Secondary | ICD-10-CM

## 2015-10-21 DIAGNOSIS — B373 Candidiasis of vulva and vagina: Secondary | ICD-10-CM

## 2015-10-21 DIAGNOSIS — Z113 Encounter for screening for infections with a predominantly sexual mode of transmission: Secondary | ICD-10-CM

## 2015-10-21 DIAGNOSIS — Z13220 Encounter for screening for lipoid disorders: Secondary | ICD-10-CM

## 2015-10-21 DIAGNOSIS — Z01419 Encounter for gynecological examination (general) (routine) without abnormal findings: Secondary | ICD-10-CM | POA: Diagnosis not present

## 2015-10-21 DIAGNOSIS — B3731 Acute candidiasis of vulva and vagina: Secondary | ICD-10-CM

## 2015-10-21 LAB — URINALYSIS W MICROSCOPIC + REFLEX CULTURE
Bilirubin Urine: NEGATIVE
Casts: NONE SEEN [LPF]
Crystals: NONE SEEN [HPF]
Glucose, UA: NEGATIVE
Hgb urine dipstick: NEGATIVE
Ketones, ur: NEGATIVE
Leukocytes, UA: NEGATIVE
Nitrite: NEGATIVE
Protein, ur: NEGATIVE
RBC / HPF: NONE SEEN RBC/HPF (ref <=2)
Specific Gravity, Urine: 1.01 (ref 1.001–1.035)
pH: 6 (ref 5.0–8.0)

## 2015-10-21 LAB — LIPID PANEL
Cholesterol: 197 mg/dL (ref 125–200)
HDL: 69 mg/dL (ref >=46)
LDL Cholesterol: 108 mg/dL (ref <130)
Total CHOL/HDL Ratio: 2.9 Ratio (ref <=5.0)
Triglycerides: 101 mg/dL (ref <150)
VLDL: 20 mg/dL (ref <30)

## 2015-10-21 LAB — CBC WITH DIFFERENTIAL/PLATELET
Basophils Absolute: 64 cells/uL (ref 0–200)
Basophils Relative: 1 %
Eosinophils Absolute: 64 cells/uL (ref 15–500)
Eosinophils Relative: 1 %
HCT: 36.8 % (ref 35.0–45.0)
Hemoglobin: 12.3 g/dL (ref 11.7–15.5)
Lymphocytes Relative: 18 %
Lymphs Abs: 1152 cells/uL (ref 850–3900)
MCH: 30.1 pg (ref 27.0–33.0)
MCHC: 33.4 g/dL (ref 32.0–36.0)
MCV: 90.2 fL (ref 80.0–100.0)
MPV: 12.2 fL (ref 7.5–12.5)
Monocytes Absolute: 512 cells/uL (ref 200–950)
Monocytes Relative: 8 %
Neutro Abs: 4608 cells/uL (ref 1500–7800)
Neutrophils Relative %: 72 %
Platelets: 314 10*3/uL (ref 140–400)
RBC: 4.08 MIL/uL (ref 3.80–5.10)
RDW: 13.5 % (ref 11.0–15.0)
WBC: 6.4 10*3/uL (ref 3.8–10.8)

## 2015-10-21 LAB — COMPREHENSIVE METABOLIC PANEL
ALT: 12 U/L (ref 6–29)
AST: 13 U/L (ref 10–30)
Albumin: 3.9 g/dL (ref 3.6–5.1)
Alkaline Phosphatase: 56 U/L (ref 33–115)
BUN: 10 mg/dL (ref 7–25)
CO2: 25 mmol/L (ref 20–31)
Calcium: 8.9 mg/dL (ref 8.6–10.2)
Chloride: 102 mmol/L (ref 98–110)
Creat: 0.8 mg/dL (ref 0.50–1.10)
Glucose, Bld: 100 mg/dL — ABNORMAL HIGH (ref 65–99)
Potassium: 4.1 mmol/L (ref 3.5–5.3)
Sodium: 138 mmol/L (ref 135–146)
Total Bilirubin: 0.8 mg/dL (ref 0.2–1.2)
Total Protein: 6.7 g/dL (ref 6.1–8.1)

## 2015-10-21 MED ORDER — TERCONAZOLE 0.4 % VA CREA: 1.0000 | TOPICAL_CREAM | Freq: Every day | VAGINAL | Status: DC

## 2015-10-21 MED ORDER — FLUCONAZOLE 150 MG PO TABS: 150.0000 mg | ORAL_TABLET | Freq: Once | ORAL | Status: DC

## 2015-10-21 NOTE — Patient Instructions (Signed)
Health Maintenance, Female Adopting a healthy lifestyle and getting preventive care can go a long way to promote health and wellness. Talk with your health care provider about what schedule of regular examinations is right for you. This is a good chance for you to check in with your provider about disease prevention and staying healthy. In between checkups, there are plenty of things you can do on your own. Experts have done a lot of research about which lifestyle changes and preventive measures are most likely to keep you healthy. Ask your health care provider for more information. WEIGHT AND DIET  Eat a healthy diet  Be sure to include plenty of vegetables, fruits, low-fat dairy products, and lean protein.  Do not eat a lot of foods high in solid fats, added sugars, or salt.  Get regular exercise. This is one of the most important things you can do for your health.  Most adults should exercise for at least 150 minutes each week. The exercise should increase your heart rate and make you sweat (moderate-intensity exercise).  Most adults should also do strengthening exercises at least twice a week. This is in addition to the moderate-intensity exercise.  Maintain a healthy weight  Body mass index (BMI) is a measurement that can be used to identify possible weight problems. It estimates body fat based on height and weight. Your health care provider can help determine your BMI and help you achieve or maintain a healthy weight.  For females 20 years of age and older:   A BMI below 18.5 is considered underweight.  A BMI of 18.5 to 24.9 is normal.  A BMI of 25 to 29.9 is considered overweight.  A BMI of 30 and above is considered obese.  Watch levels of cholesterol and blood lipids  You should start having your blood tested for lipids and cholesterol at 44 years of age, then have this test every 5 years.  You may need to have your cholesterol levels checked more often if:  Your lipid  or cholesterol levels are high.  You are older than 44 years of age.  You are at high risk for heart disease.  CANCER SCREENING   Lung Cancer  Lung cancer screening is recommended for adults 55-80 years old who are at high risk for lung cancer because of a history of smoking.  A yearly low-dose CT scan of the lungs is recommended for people who:  Currently smoke.  Have quit within the past 15 years.  Have at least a 30-pack-year history of smoking. A pack year is smoking an average of one pack of cigarettes a day for 1 year.  Yearly screening should continue until it has been 15 years since you quit.  Yearly screening should stop if you develop a health problem that would prevent you from having lung cancer treatment.  Breast Cancer  Practice breast self-awareness. This means understanding how your breasts normally appear and feel.  It also means doing regular breast self-exams. Let your health care provider know about any changes, no matter how small.  If you are in your 20s or 30s, you should have a clinical breast exam (CBE) by a health care provider every 1-3 years as part of a regular health exam.  If you are 40 or older, have a CBE every year. Also consider having a breast X-ray (mammogram) every year.  If you have a family history of breast cancer, talk to your health care provider about genetic screening.  If you   are at high risk for breast cancer, talk to your health care provider about having an MRI and a mammogram every year.  Breast cancer gene (BRCA) assessment is recommended for women who have family members with BRCA-related cancers. BRCA-related cancers include:  Breast.  Ovarian.  Tubal.  Peritoneal cancers.  Results of the assessment will determine the need for genetic counseling and BRCA1 and BRCA2 testing. Cervical Cancer Your health care provider may recommend that you be screened regularly for cancer of the pelvic organs (ovaries, uterus, and  vagina). This screening involves a pelvic examination, including checking for microscopic changes to the surface of your cervix (Pap test). You may be encouraged to have this screening done every 3 years, beginning at age 21.  For women ages 30-65, health care providers may recommend pelvic exams and Pap testing every 3 years, or they may recommend the Pap and pelvic exam, combined with testing for human papilloma virus (HPV), every 5 years. Some types of HPV increase your risk of cervical cancer. Testing for HPV may also be done on women of any age with unclear Pap test results.  Other health care providers may not recommend any screening for nonpregnant women who are considered low risk for pelvic cancer and who do not have symptoms. Ask your health care provider if a screening pelvic exam is right for you.  If you have had past treatment for cervical cancer or a condition that could lead to cancer, you need Pap tests and screening for cancer for at least 20 years after your treatment. If Pap tests have been discontinued, your risk factors (such as having a new sexual partner) need to be reassessed to determine if screening should resume. Some women have medical problems that increase the chance of getting cervical cancer. In these cases, your health care provider may recommend more frequent screening and Pap tests. Colorectal Cancer  This type of cancer can be detected and often prevented.  Routine colorectal cancer screening usually begins at 44 years of age and continues through 44 years of age.  Your health care provider may recommend screening at an earlier age if you have risk factors for colon cancer.  Your health care provider may also recommend using home test kits to check for hidden blood in the stool.  A small camera at the end of a tube can be used to examine your colon directly (sigmoidoscopy or colonoscopy). This is done to check for the earliest forms of colorectal  cancer.  Routine screening usually begins at age 50.  Direct examination of the colon should be repeated every 5-10 years through 44 years of age. However, you may need to be screened more often if early forms of precancerous polyps or small growths are found. Skin Cancer  Check your skin from head to toe regularly.  Tell your health care provider about any new moles or changes in moles, especially if there is a change in a mole's shape or color.  Also tell your health care provider if you have a mole that is larger than the size of a pencil eraser.  Always use sunscreen. Apply sunscreen liberally and repeatedly throughout the day.  Protect yourself by wearing long sleeves, pants, a wide-brimmed hat, and sunglasses whenever you are outside. HEART DISEASE, DIABETES, AND HIGH BLOOD PRESSURE   High blood pressure causes heart disease and increases the risk of stroke. High blood pressure is more likely to develop in:  People who have blood pressure in the high end   of the normal range (130-139/85-89 mm Hg).  People who are overweight or obese.  People who are African American.  If you are 38-23 years of age, have your blood pressure checked every 3-5 years. If you are 61 years of age or older, have your blood pressure checked every year. You should have your blood pressure measured twice--once when you are at a hospital or clinic, and once when you are not at a hospital or clinic. Record the average of the two measurements. To check your blood pressure when you are not at a hospital or clinic, you can use:  An automated blood pressure machine at a pharmacy.  A home blood pressure monitor.  If you are between 45 years and 39 years old, ask your health care provider if you should take aspirin to prevent strokes.  Have regular diabetes screenings. This involves taking a blood sample to check your fasting blood sugar level.  If you are at a normal weight and have a low risk for diabetes,  have this test once every three years after 44 years of age.  If you are overweight and have a high risk for diabetes, consider being tested at a younger age or more often. PREVENTING INFECTION  Hepatitis B  If you have a higher risk for hepatitis B, you should be screened for this virus. You are considered at high risk for hepatitis B if:  You were born in a country where hepatitis B is common. Ask your health care provider which countries are considered high risk.  Your parents were born in a high-risk country, and you have not been immunized against hepatitis B (hepatitis B vaccine).  You have HIV or AIDS.  You use needles to inject street drugs.  You live with someone who has hepatitis B.  You have had sex with someone who has hepatitis B.  You get hemodialysis treatment.  You take certain medicines for conditions, including cancer, organ transplantation, and autoimmune conditions. Hepatitis C  Blood testing is recommended for:  Everyone born from [birth year] through 1964.  Anyone with known risk factors for hepatitis C. Sexually transmitted infections (STIs)  You should be screened for sexually transmitted infections (STIs) including gonorrhea and chlamydia if:  You are sexually active and are younger than 44 years of age.  You are older than 44 years of age and your health care provider tells you that you are at risk for this type of infection.  Your sexual activity has changed since you were last screened and you are at an increased risk for chlamydia or gonorrhea. Ask your health care provider if you are at risk.  If you do not have HIV, but are at risk, it may be recommended that you take a prescription medicine daily to prevent HIV infection. This is called pre-exposure prophylaxis (PrEP). You are considered at risk if:  You are sexually active and do not regularly use condoms or know the HIV status of your partner(s).  You take drugs by injection.  You are sexually  active with a partner who has HIV. Talk with your health care provider about whether you are at high risk of being infected with HIV. If you choose to begin PrEP, you should first be tested for HIV. You should then be tested every 3 months for as long as you are taking PrEP.  PREGNANCY   If you are premenopausal and you may become pregnant, ask your health care provider about preconception counseling.  If you may  become pregnant, take 400 to 800 micrograms (mcg) of folic acid every day.  If you want to prevent pregnancy, talk to your health care provider about birth control (contraception). OSTEOPOROSIS AND MENOPAUSE   Osteoporosis is a disease in which the bones lose minerals and strength with aging. This can result in serious bone fractures. Your risk for osteoporosis can be identified using a bone density scan.  If you are 61 years of age or older, or if you are at risk for osteoporosis and fractures, ask your health care provider if you should be screened.  Ask your health care provider whether you should take a calcium or vitamin D supplement to lower your risk for osteoporosis.  Menopause may have certain physical symptoms and risks.  Hormone replacement therapy may reduce some of these symptoms and risks. Talk to your health care provider about whether hormone replacement therapy is right for you.  HOME CARE INSTRUCTIONS   Schedule regular health, dental, and eye exams.  Stay current with your immunizations.   Do not use any tobacco products including cigarettes, chewing tobacco, or electronic cigarettes.  If you are pregnant, do not drink alcohol.  If you are breastfeeding, limit how much and how often you drink alcohol.  Limit alcohol intake to no more than 1 drink per day for nonpregnant women. One drink equals 12 ounces of beer, 5 ounces of wine, or 1 ounces of hard liquor.  Do not use street drugs.  Do not share needles.  Ask your health care provider for help if  you need support or information about quitting drugs.  Tell your health care provider if you often feel depressed.  Tell your health care provider if you have ever been abused or do not feel safe at home.   This information is not intended to replace advice given to you by your health care provider. Make sure you discuss any questions you have with your health care provider.   Document Released: 11/02/2010 Document Revised: 05/10/2014 Document Reviewed: 03/21/2013 Elsevier Interactive Patient Education [employer].

## 2015-10-21 NOTE — Progress Notes (Signed)
YENESIS EVEN [birth year] [id number]    History:    Presents for annual exam.  TAH with RSO for DUB and prolapse. History of a bladder sling. Reports nipple discharge bloody with normal ultrasound and mammogram. Normal Pap history. 2013 benign colon polyps has follow-up scheduled for 5 years. Process of a divorce husband  unfaithful. Anxiety and depression primary care manages. Mother breast cancer survivor BRCA status unknown, patient BRCA negative.  Past medical history, past surgical history, family history and social history were all reviewed and documented in the EPIC chart. Office job. Parents alcohol abuse and hypertension. Mother anxiety and depression.  ROS:  A ROS was performed and pertinent positives and negatives are included.  Exam:  Filed Vitals:   10/21/15 1139  BP: 124/80    General appearance:  Normal Thyroid:  Symmetrical, normal in size, without palpable masses or nodularity. Respiratory  Auscultation:  Clear without wheezing or rhonchi Cardiovascular  Auscultation:  Regular rate, without rubs, murmurs or gallops  Edema/varicosities:  Not grossly evident Abdominal  Soft,nontender, without masses, guarding or rebound.  Liver/spleen:  No organomegaly noted  Hernia:  None appreciated  Skin  Inspection:  Grossly normal   Breasts: Examined lying and sitting.     Right: Without masses, retractions, discharge or axillary adenopathy.     Left: Without masses, retractions, discharge or axillary adenopathy. Gentitourinary   Inguinal/mons:  Normal without inguinal adenopathy  External genitalia:  Normal  BUS/Urethra/Skene's glands:  Normal  Vagina:  NormalVisible discharge or erythema  Cervix:  And uterus absent Adnexa/parametria:     Rt: Without masses or tenderness.   Lt: Without masses or tenderness.  Anus and perineum: Normal  Digital rectal exam: Normal sphincter tone without palpated masses or tenderness  Assessment/Plan:  45 y.o. DW F G2 P2 for annual exam  complaint of external vaginal itching occasionally and mild urinary burning  TVH with RSO Yeast vaginitis STD screen Anxiety and depression-primary care manages  Plan: Diflucan 150 by mouth 1 dose, Terazol 7 apply small amount externally as needed. Yeast prevention discussed. SBE's, annual screening mammogram, 3-D tomography reviewed and encouraged. Continue exercise, calcium rich diet, vitamin D 1000 daily encouraged. Encouraged to continue counseling for situational stress of divorce. CBC, CMP, lipid panel, vitamin D, UA, GC/Chlamydia from urethra, HIV, hep B, C, RPR.  Reviewed UA negative, Urine culture pending.     Huel Cote WHNP, 1:15 PM 10/21/2015

## 2015-10-22 LAB — HIV ANTIBODY (ROUTINE TESTING W REFLEX): HIV 1&2 Ab, 4th Generation: NONREACTIVE

## 2015-10-22 LAB — HEPATITIS B SURFACE ANTIGEN: Hepatitis B Surface Ag: NEGATIVE

## 2015-10-22 LAB — GC/CHLAMYDIA PROBE AMP
CT Probe RNA: NOT DETECTED
GC Probe RNA: NOT DETECTED

## 2015-10-22 LAB — RPR

## 2015-10-22 LAB — VITAMIN D 25 HYDROXY (VIT D DEFICIENCY, FRACTURES): Vit D, 25-Hydroxy: 30 ng/mL (ref 30–100)

## 2015-10-22 LAB — HEPATITIS C ANTIBODY: HCV Ab: NEGATIVE

## 2015-10-23 ENCOUNTER — Other Ambulatory Visit: Payer: Self-pay | Admitting: Gastroenterology

## 2015-10-23 LAB — URINE CULTURE
Colony Count: NO GROWTH
Organism ID, Bacteria: NO GROWTH

## 2015-10-24 NOTE — Telephone Encounter (Signed)
Needs ov for further refills. rf x 2 only.

## 2015-10-27 NOTE — Telephone Encounter (Signed)
Tried to call and pt is not available at this time.

## 2015-10-27 NOTE — Telephone Encounter (Signed)
Letter mailed to pt to call for appt.  

## 2015-11-05 ENCOUNTER — Telehealth: Payer: Self-pay | Admitting: Nurse Practitioner

## 2015-11-05 MED ORDER — AMLODIPINE BESYLATE 2.5 MG PO TABS: 2.5000 mg | ORAL_TABLET | Freq: Every day | ORAL | Status: DC

## 2015-11-05 NOTE — Telephone Encounter (Signed)
Continue current medication. add amlodipine 2.5 mg 1 daily #30 one refill, office visit within the next 2 weeks to recheck blood pressure-sooner if any problems

## 2015-11-05 NOTE — Telephone Encounter (Signed)
Discussed with patient. Patient advised to continue current medication. add amlodipine 2.5 mg 1 daily #30 one refill, office visit within the next 2 weeks to recheck blood pressure-sooner if any problems. Patient verbalized understanding and scheduled follow up office visit. Prescription sent electronically to pharmacy.

## 2015-11-05 NOTE — Telephone Encounter (Signed)
Pt called stating that her bp was really high the past couple days and had bouts of nausea. Pt states that her blood pressure was 148/104 and 134/85. Pt wants to know if Hoyle Sauer wants to change her bp meds or what. Please advise.

## 2015-11-05 NOTE — Telephone Encounter (Signed)
Patient currently on Lisinopril/HCTZ 20/25 one a day  Patient's last blood pressure check up 02/21/15 an was advised to follow up in 4 weeks  Patient has no Insurance so would like med to be low cost

## 2015-11-12 ENCOUNTER — Encounter: Payer: Self-pay | Admitting: Nurse Practitioner

## 2015-11-12 ENCOUNTER — Ambulatory Visit (INDEPENDENT_AMBULATORY_CARE_PROVIDER_SITE_OTHER): Payer: BLUE CROSS/BLUE SHIELD | Admitting: Nurse Practitioner

## 2015-11-12 VITALS — BP 138/86 | Ht 65.0 in | Wt 209.0 lb

## 2015-11-12 DIAGNOSIS — R55 Syncope and collapse: Secondary | ICD-10-CM | POA: Diagnosis not present

## 2015-11-12 DIAGNOSIS — R7301 Impaired fasting glucose: Secondary | ICD-10-CM | POA: Diagnosis not present

## 2015-11-12 DIAGNOSIS — F419 Anxiety disorder, unspecified: Secondary | ICD-10-CM | POA: Diagnosis not present

## 2015-11-12 DIAGNOSIS — I10 Essential (primary) hypertension: Secondary | ICD-10-CM | POA: Diagnosis not present

## 2015-11-12 LAB — POCT GLYCOSYLATED HEMOGLOBIN (HGB A1C): Hemoglobin A1C: 5.4

## 2015-11-12 MED ORDER — CLONAZEPAM 0.5 MG PO TABS: ORAL_TABLET | ORAL | Status: DC

## 2015-11-12 MED ORDER — CITALOPRAM HYDROBROMIDE 20 MG PO TABS: 20.0000 mg | ORAL_TABLET | Freq: Every day | ORAL | Status: DC

## 2015-11-15 ENCOUNTER — Encounter: Payer: Self-pay | Admitting: Nurse Practitioner

## 2015-11-15 NOTE — Progress Notes (Signed)
Subjective:  Presents for c/o feeling lightheaded at times with elevated BP. Started several weeks ago. Occurs every few days. Starts with nausea then headache feels hot then breaks out in a cold sweat. Main trigger seems to be anxiety or being upset. Feels bad for about 2 hours. Vomiting x 1. No palpitations, SOB or chest pain. Relieved by laying down or relaxing. No numbness of the face, arms or legs. No difficulty speaking or swallowing. Has just completed a divorce.   Objective:   BP 138/86 mmHg  Ht 5\' 5"  (1.651 m)  Wt 209 lb (94.802 kg)  BMI 34.78 kg/m2 NAD. Alert, oriented. TMs minimal clear effusion. Pharynx clear. Neck supple with minimal adenopathy. Lungs clear. Heart RRR. No murmur noted. Pupils equal and reactive to light. EOMs intact without nystagmus. Point to point localization normal. Muscle strength 5+ bilat. Reflexes normal. Romberg neg. Orthostatic BP stable.  Results for orders placed or performed in visit on 11/12/15  POCT glycosylated hemoglobin (Hb A1C)  Result Value Ref Range   Hemoglobin A1C 5.4      Assessment:  Problem List Items Addressed This Visit      Cardiovascular and Mediastinum   Essential hypertension, benign - Primary     Endocrine   IFG (impaired fasting glucose)   Relevant Orders   POCT glycosylated hemoglobin (Hb A1C) (Completed)     Other   Anxiety   Relevant Medications   citalopram (CELEXA) 20 MG tablet    Other Visit Diagnoses    Vasovagal syndrome          Plan:  Meds ordered this encounter  Medications  . citalopram (CELEXA) 20 MG tablet    Sig: Take 1 tablet (20 mg total) by mouth daily.    Dispense:  30 tablet    Refill:  2    Order Specific Question:  Supervising Provider    Answer:  Mikey Kirschner [[id number]]  . clonazePAM (KLONOPIN) 0.5 MG tablet    Sig: take 1 tablet by mouth twice a day if needed    Dispense:  30 tablet    Refill:  2    May refill monthly    Order Specific Question:  Supervising Provider    Answer:   Mikey Kirschner [[id number]]   Encouraged regular meals and snacks; stress reduction. Warning signs reviewed.  Return in about 1 month (around 12/13/2015) for recheck. Sooner if worse.

## 2015-11-18 ENCOUNTER — Encounter: Payer: BLUE CROSS/BLUE SHIELD | Admitting: Nurse Practitioner

## 2015-12-05 ENCOUNTER — Encounter: Payer: Self-pay | Admitting: Nurse Practitioner

## 2015-12-05 ENCOUNTER — Ambulatory Visit (INDEPENDENT_AMBULATORY_CARE_PROVIDER_SITE_OTHER): Payer: Self-pay | Admitting: Nurse Practitioner

## 2015-12-05 VITALS — BP 110/80 | Temp 98.4°F | Ht 65.0 in | Wt 206.2 lb

## 2015-12-05 DIAGNOSIS — R1032 Left lower quadrant pain: Secondary | ICD-10-CM

## 2015-12-05 DIAGNOSIS — K5732 Diverticulitis of large intestine without perforation or abscess without bleeding: Secondary | ICD-10-CM

## 2015-12-05 DIAGNOSIS — K5900 Constipation, unspecified: Secondary | ICD-10-CM

## 2015-12-05 DIAGNOSIS — K5730 Diverticulosis of large intestine without perforation or abscess without bleeding: Secondary | ICD-10-CM | POA: Insufficient documentation

## 2015-12-05 MED ORDER — CIPROFLOXACIN HCL 500 MG PO TABS: 500.0000 mg | ORAL_TABLET | Freq: Two times a day (BID) | ORAL | 0 refills | Status: DC

## 2015-12-05 MED ORDER — METRONIDAZOLE 500 MG PO TABS: 500.0000 mg | ORAL_TABLET | Freq: Three times a day (TID) | ORAL | 0 refills | Status: DC

## 2015-12-07 ENCOUNTER — Encounter: Payer: Self-pay | Admitting: Nurse Practitioner

## 2015-12-07 NOTE — Progress Notes (Signed)
Subjective:  Presents for c/o LLQ abd pain off/on x 2 weeks worse x 4 d. No documented fever. Has felt hot and cold at times. Has chronic constipation. Took magnesium citrate this am; having small amounts of stool every two hours; no blood noted. Pain worse with certain movements. Has had hysterectomy with removal of left ovary. No new sexual partners. No vaginal discharge. No urinary symptoms. Did eat some popcorn around the time the symptoms began. No N/V. Has tried Miralax and a liquid diet with no improvement.   Objective:   BP 110/80   Temp 98.4 F (36.9 C) (Oral)   Ht 5\' 5"  (1.651 m)   Wt 206 lb 4 oz (93.6 kg)   BMI 34.32 kg/m  NAD. Alert, oriented. Lungs clear. No CVA tenderness. Abdomen soft, non distended with active BS x 4. Distinct LLQ tenderness with guarding. No obvious masses.  Colonoscopy dated 04/24/12 shows moderate diverticula in descending and sigmoid colon.   Assessment: Diverticulitis of colon  Left lower quadrant pain - Plan: CANCELED: POCT urinalysis dipstick  Constipation, unspecified constipation type  Plan:  Meds ordered this encounter  Medications  . ciprofloxacin (CIPRO) 500 MG tablet    Sig: Take 1 tablet (500 mg total) by mouth 2 (two) times daily.    Dispense:  20 tablet    Refill:  0    Order Specific Question:   Supervising Provider    Answer:   Mikey Kirschner [[id number]]  . metroNIDAZOLE (FLAGYL) 500 MG tablet    Sig: Take 1 tablet (500 mg total) by mouth 3 (three) times daily.    Dispense:  30 tablet    Refill:  0    Order Specific Question:   Supervising Provider    Answer:   Mikey Kirschner [[id number]]   Patient is uninsured so defers ED visit and scan if possible. Start antibiotics. Reviewed warning signs including fever, vomiting, worsening abd pain or bloody stools. Patient to go to ED over the weekend if worse; call Monday am if no better.

## 2015-12-08 ENCOUNTER — Encounter: Payer: Self-pay | Admitting: Nurse Practitioner

## 2015-12-12 ENCOUNTER — Ambulatory Visit: Payer: BLUE CROSS/BLUE SHIELD | Admitting: Nurse Practitioner

## 2016-02-18 ENCOUNTER — Ambulatory Visit: Payer: Self-pay | Admitting: Rheumatology

## 2016-04-05 ENCOUNTER — Telehealth: Payer: Self-pay | Admitting: Family Medicine

## 2016-04-05 NOTE — Telephone Encounter (Signed)
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  She had a physical with her gyn in June of this year but also needs a TB test and the form only has a place for one doctor to sign  Please advise

## 2016-04-06 ENCOUNTER — Ambulatory Visit (INDEPENDENT_AMBULATORY_CARE_PROVIDER_SITE_OTHER): Payer: Self-pay

## 2016-04-06 DIAGNOSIS — Z111 Encounter for screening for respiratory tuberculosis: Secondary | ICD-10-CM

## 2016-04-07 NOTE — Telephone Encounter (Signed)
ok 

## 2016-04-07 NOTE — Telephone Encounter (Signed)
Let me see form please

## 2016-04-07 NOTE — Telephone Encounter (Signed)
Called pt, states she will bring form with her to her OV with [facility] tomorrow

## 2016-04-08 ENCOUNTER — Encounter: Payer: Self-pay | Admitting: Nurse Practitioner

## 2016-04-08 ENCOUNTER — Ambulatory Visit (INDEPENDENT_AMBULATORY_CARE_PROVIDER_SITE_OTHER): Payer: Self-pay | Admitting: Nurse Practitioner

## 2016-04-08 VITALS — BP 122/84 | Ht 65.0 in | Wt 221.3 lb

## 2016-04-08 DIAGNOSIS — J300 Vasomotor rhinitis: Secondary | ICD-10-CM

## 2016-04-08 DIAGNOSIS — D1722 Benign lipomatous neoplasm of skin and subcutaneous tissue of left arm: Secondary | ICD-10-CM

## 2016-04-08 LAB — TB SKIN TEST
Induration: 0 mm
TB Skin Test: NEGATIVE

## 2016-04-08 MED ORDER — PHENTERMINE HCL 37.5 MG PO TABS: 37.5000 mg | ORAL_TABLET | Freq: Every day | ORAL | 2 refills | Status: DC

## 2016-04-08 NOTE — Patient Instructions (Signed)
rhinocort or nasacort Anti histamine

## 2016-04-10 ENCOUNTER — Encounter: Payer: Self-pay | Admitting: Nurse Practitioner

## 2016-04-10 DIAGNOSIS — E66812 Obesity, class 2: Secondary | ICD-10-CM | POA: Insufficient documentation

## 2016-04-10 NOTE — Progress Notes (Signed)
Subjective:  Presents for c/o non tender cyst in the left arm. Has been there for months. No change in size. Also c/o head congestion for 1-2 weeks. Increased cough at night. No fever. Sore throat. Ears itching. Facial area headache at times that will radiate to the teeth. Comes and goes. Would like to restart Phentermine for weight loss. Has taken without difficulty in the past.   Objective:   BP 122/84   Ht 5\' 5"  (1.651 m)   Wt 221 lb 4.8 oz (100.4 kg)   BMI 36.83 kg/m  NAD. Alert, oriented. TMs clear effusion. Pharynx clear. Neck supple with minimal anterior adenopathy. Lungs clear. Heart RRR. Small superficial slightly raised cystic area left arm. Non tender.   Assessment:  Problem List Items Addressed This Visit      Other   Morbid obesity ([place])   Relevant Medications   phentermine (ADIPEX-P) 37.5 MG tablet    Other Visit Diagnoses    Acute vasomotor rhinitis    -  Primary   Lipoma of left upper extremity         Plan:  Meds ordered this encounter  Medications  . phentermine (ADIPEX-P) 37.5 MG tablet    Sig: Take 1 tablet (37.5 mg total) by mouth daily before breakfast.    Dispense:  30 tablet    Refill:  2    Order Specific Question:   Supervising Provider    Answer:   Mikey Kirschner [[id number]]   OTC meds as directed for head congestion. Call back if worsens or persists. Defers removal of cyst at this time. Encouraged activity and healthy diet. Follow up in 3 months if she wishes to continue Phentermine.

## 2016-04-14 ENCOUNTER — Encounter: Payer: Self-pay | Admitting: Nurse Practitioner

## 2016-04-14 ENCOUNTER — Other Ambulatory Visit: Payer: Self-pay | Admitting: Nurse Practitioner

## 2016-04-14 MED ORDER — AMOXICILLIN 500 MG PO CAPS
500.0000 mg | ORAL_CAPSULE | Freq: Three times a day (TID) | ORAL | 0 refills | Status: DC
Start: 2016-04-14 — End: 2016-04-30

## 2016-04-16 ENCOUNTER — Encounter: Payer: Self-pay | Admitting: Nurse Practitioner

## 2016-04-30 ENCOUNTER — Encounter: Payer: Self-pay | Admitting: Nurse Practitioner

## 2016-04-30 ENCOUNTER — Other Ambulatory Visit: Payer: Self-pay | Admitting: Nurse Practitioner

## 2016-04-30 MED ORDER — CIPROFLOXACIN HCL 500 MG PO TABS: 500.0000 mg | ORAL_TABLET | Freq: Two times a day (BID) | ORAL | 0 refills | Status: DC

## 2016-05-01 ENCOUNTER — Encounter: Payer: Self-pay | Admitting: Nurse Practitioner

## 2016-05-04 ENCOUNTER — Other Ambulatory Visit: Payer: Self-pay | Admitting: Nurse Practitioner

## 2016-05-14 ENCOUNTER — Other Ambulatory Visit: Payer: Self-pay | Admitting: Family Medicine

## 2016-05-14 NOTE — Telephone Encounter (Signed)
It appears HTN not addressed since July. Would you like to refill?

## 2016-07-15 ENCOUNTER — Encounter: Payer: Self-pay | Admitting: Nurse Practitioner

## 2016-07-15 ENCOUNTER — Ambulatory Visit (INDEPENDENT_AMBULATORY_CARE_PROVIDER_SITE_OTHER): Payer: Self-pay | Admitting: Nurse Practitioner

## 2016-07-15 VITALS — BP 106/80 | Temp 98.5°F | Ht 65.0 in | Wt 215.1 lb

## 2016-07-15 DIAGNOSIS — R300 Dysuria: Secondary | ICD-10-CM

## 2016-07-15 DIAGNOSIS — B081 Molluscum contagiosum: Secondary | ICD-10-CM

## 2016-07-15 LAB — POCT URINALYSIS DIPSTICK
Spec Grav, UA: 1.005
pH, UA: 7

## 2016-07-15 MED ORDER — IMIQUIMOD 5 % EX CREA: TOPICAL_CREAM | CUTANEOUS | 3 refills | Status: DC

## 2016-07-16 ENCOUNTER — Encounter: Payer: Self-pay | Admitting: Nurse Practitioner

## 2016-07-16 NOTE — Progress Notes (Signed)
Subjective:  Presents with complaints of lesions in the pubic area for the past 2 weeks. No pain or itching. Has a new sexual partner. No pelvic pain discharge or fever. Is uninsured. Defers STD testing at this time. Had mild dysuria about a week ago but this has resolved. No other urinary symptoms. Patient actually made the appointment because of the rash.  Objective:   BP 106/80   Temp 98.5 F (36.9 C) (Oral)   Ht 5\' 5"  (1.651 m)   Wt 215 lb 2 oz (97.6 kg)   BMI 35.80 kg/m  NAD. Alert, oriented. Lungs clear. Heart regular rhythm. Smooth well-defined slightly raised skin colored dome like lesion in the center of the mons pubis with what appears to be several other early similar lesions. No erythema. Nontender to palpation. No lesions on the vulva.  Results for orders placed or performed in visit on 07/15/16  POCT urinalysis dipstick  Result Value Ref Range   Color, UA     Clarity, UA     Glucose, UA     Bilirubin, UA     Ketones, UA     Spec Grav, UA 1.005 1.003, 1.005, 1.010, 1.015, 1.020, 1.025, 1.030, 1.035   Blood, UA     pH, UA 7.0 5.0, 5.5, 6.0, 6.5, 7.0, 7.5, 8.0   Protein, UA     Urobilinogen, UA  0.2, 1.0, negative   Nitrite, UA     Leukocytes, UA small (1+) (A) Negative     Assessment:  Molluscum contagiosum  Dysuria - Plan: POCT urinalysis dipstick    Plan:   Meds ordered this encounter  Medications  . imiquimod (ALDARA) 5 % cream    Sig: Apply topically 3 (three) times a week.    Dispense:  12 each    Refill:  3    Order Specific Question:   Supervising Provider    Answer:   Mikey Kirschner [[id number]]   Recheck if rash persists or if any changes. Patient advised the rash is in very early stages and may change over time. Discussed safe sex issues.

## 2016-07-19 ENCOUNTER — Encounter: Payer: Self-pay | Admitting: Nurse Practitioner

## 2016-09-28 ENCOUNTER — Telehealth: Payer: Self-pay | Admitting: *Deleted

## 2016-09-28 ENCOUNTER — Encounter: Payer: Self-pay | Admitting: Nurse Practitioner

## 2016-09-28 NOTE — Telephone Encounter (Signed)
I agree

## 2016-09-28 NOTE — Telephone Encounter (Signed)
Patient called with c/o chest pain for 3 days- getting worse. Patient also is having some SOB and numbness in fingers. Patient states she has a history of reflux and has tried numerous heartburn medications without relief. Advised patient to got to ER for evaluation and treatment-patient verbalized understanding.

## 2016-10-12 ENCOUNTER — Encounter: Payer: Self-pay | Admitting: Nurse Practitioner

## 2016-10-20 ENCOUNTER — Other Ambulatory Visit: Payer: Self-pay | Admitting: Nurse Practitioner

## 2016-10-20 MED ORDER — PODOFILOX 0.5 % EX GEL: CUTANEOUS | 0 refills | Status: DC

## 2016-10-21 ENCOUNTER — Ambulatory Visit (INDEPENDENT_AMBULATORY_CARE_PROVIDER_SITE_OTHER): Payer: Self-pay | Admitting: Nurse Practitioner

## 2016-10-21 ENCOUNTER — Encounter: Payer: Self-pay | Admitting: Nurse Practitioner

## 2016-10-21 VITALS — BP 130/82 | Temp 98.6°F | Ht 65.0 in | Wt 220.0 lb

## 2016-10-21 DIAGNOSIS — K219 Gastro-esophageal reflux disease without esophagitis: Secondary | ICD-10-CM

## 2016-10-21 DIAGNOSIS — L0231 Cutaneous abscess of buttock: Secondary | ICD-10-CM

## 2016-10-21 MED ORDER — SULFAMETHOXAZOLE-TRIMETHOPRIM 800-160 MG PO TABS: 1.0000 | ORAL_TABLET | Freq: Two times a day (BID) | ORAL | 0 refills | Status: DC

## 2016-10-21 MED ORDER — SUCRALFATE 1 GM/10ML PO SUSP: 1.0000 g | Freq: Three times a day (TID) | ORAL | 0 refills | Status: DC

## 2016-10-21 MED ORDER — PANTOPRAZOLE SODIUM 40 MG PO TBEC: 40.0000 mg | DELAYED_RELEASE_TABLET | Freq: Two times a day (BID) | ORAL | 0 refills | Status: DC

## 2016-10-21 MED ORDER — HYDROCODONE-ACETAMINOPHEN 5-325 MG PO TABS: 1.0000 | ORAL_TABLET | ORAL | 0 refills | Status: DC | PRN

## 2016-10-21 NOTE — Patient Instructions (Signed)
Food Choices for Gastroesophageal Reflux Disease, Adult When you have gastroesophageal reflux disease (GERD), the foods you eat and your eating habits are very important. Choosing the right foods can help ease the discomfort of GERD. Consider working with a diet and nutrition specialist (dietitian) to help you make healthy food choices. What general guidelines should I follow? Eating plan  Choose healthy foods low in fat, such as fruits, vegetables, whole grains, low-fat dairy products, and lean meat, fish, and poultry.  Eat frequent, small meals instead of three large meals each day. Eat your meals slowly, in a relaxed setting. Avoid bending over or lying down until 2-3 hours after eating.  Limit high-fat foods such as fatty meats or fried foods.  Limit your intake of oils, butter, and shortening to less than 8 teaspoons each day.  Avoid the following: ? Foods that cause symptoms. These may be different for different people. Keep a food diary to keep track of foods that cause symptoms. ? Alcohol. ? Drinking large amounts of liquid with meals. ? Eating meals during the 2-3 hours before bed.  Cook foods using methods other than frying. This may include baking, grilling, or broiling. Lifestyle   Maintain a healthy weight. Ask your health care provider what weight is healthy for you. If you need to lose weight, work with your health care provider to do so safely.  Exercise for at least 30 minutes on 5 or more days each week, or as told by your health care provider.  Avoid wearing clothes that fit tightly around your waist and chest.  Do not use any products that contain nicotine or tobacco, such as cigarettes and e-cigarettes. If you need help quitting, ask your health care provider.  Sleep with the head of your bed raised. Use a wedge under the mattress or blocks under the bed frame to raise the head of the bed. What foods are not recommended? The items listed may not be a complete  list. Talk with your dietitian about what dietary choices are best for you. Grains Pastries or quick breads with added fat. French toast. Vegetables Deep fried vegetables. French fries. Any vegetables prepared with added fat. Any vegetables that cause symptoms. For some people this may include tomatoes and tomato products, chili peppers, onions and garlic, and horseradish. Fruits Any fruits prepared with added fat. Any fruits that cause symptoms. For some people this may include citrus fruits, such as oranges, grapefruit, pineapple, and lemons. Meats and other protein foods High-fat meats, such as fatty beef or pork, hot dogs, ribs, ham, sausage, salami and bacon. Fried meat or protein, including fried fish and fried chicken. Nuts and nut butters. Dairy Whole milk and chocolate milk. Sour cream. Cream. Ice cream. Cream cheese. Milk shakes. Beverages Coffee and tea, with or without caffeine. Carbonated beverages. Sodas. Energy drinks. Fruit juice made with acidic fruits (such as orange or grapefruit). Tomato juice. Alcoholic drinks. Fats and oils Butter. Margarine. Shortening. Ghee. Sweets and desserts Chocolate and cocoa. Donuts. Seasoning and other foods Pepper. Peppermint and spearmint. Any condiments, herbs, or seasonings that cause symptoms. For some people, this may include curry, hot sauce, or vinegar-based salad dressings. Summary  When you have gastroesophageal reflux disease (GERD), food and lifestyle choices are very important to help ease the discomfort of GERD.  Eat frequent, small meals instead of three large meals each day. Eat your meals slowly, in a relaxed setting. Avoid bending over or lying down until 2-3 hours after eating.  Limit high-fat   foods such as fatty meat or fried foods. This information is not intended to replace advice given to you by your health care provider. Make sure you discuss any questions you have with your health care provider. Document Released:  04/19/2005 Document Revised: 04/20/2016 Document Reviewed: 04/20/2016 Elsevier Interactive Patient Education  2017 Elsevier Inc.  

## 2016-10-21 NOTE — Progress Notes (Signed)
Subjective:  Presents for complaints of an abscess on the right buttock that began 2 days ago. Became inflamed in the area. Very tender. No fever. Has been applying heat and doing Epsom salts baths with no improvement. States area is deep and not draining. Had a similar area on her left arm at one point. Also thinks there may have been a small knot in the area for the past year. Also complaints of intense reflux disease lately. Drinks a large amount of caffeine. Nonsmoker. Rare alcohol use. Some anti-inflammatories use. No relief with OTC Nexium 2 per day.  Objective:   BP 130/82   Temp 98.6 F (37 C) (Oral)   Ht 5\' 5"  (1.651 m)   Wt 220 lb (99.8 kg)   BMI 36.61 kg/m  NAD. Alert, oriented. Lungs clear. Heart regular rate rhythm. Large mass approximately 6 cm in diameter noted in the right mid buttock. Most of this is fairly deep with a smaller 2-3 cm area in the center with slight pink to purplish discoloration. Center is fairly fluctuant. A tiny black dot noted. Area was cleaned and attempted to inject lidocaine with epi, patient moved twice during the procedure and 2 small puncture areas resulted. Finally injection was complete. A small incision was made with a blade, a large amount of bloody drainage noted. Area smaller in size. Abdomen soft nondistended with mild epigastric area tenderness.  Assessment:  Abscess of right buttock  Gastroesophageal reflux disease without esophagitis    Plan:   Meds ordered this encounter  Medications  . pantoprazole (PROTONIX) 40 MG tablet    Sig: Take 1 tablet (40 mg total) by mouth 2 (two) times daily.    Dispense:  60 tablet    Refill:  0    Order Specific Question:   Supervising Provider    Answer:   Mikey Kirschner [[id number]]  . sucralfate (CARAFATE) 1 GM/10ML suspension    Sig: Take 10 mLs (1 g total) by mouth 4 (four) times daily -  with meals and at bedtime.    Dispense:  420 mL    Refill:  0    Order Specific Question:   Supervising Provider     Answer:   Mikey Kirschner [[id number]]  . HYDROcodone-acetaminophen (NORCO/VICODIN) 5-325 MG tablet    Sig: Take 1 tablet by mouth every 4 (four) hours as needed.    Dispense:  18 tablet    Refill:  0    Order Specific Question:   Supervising Provider    Answer:   Mikey Kirschner [[id number]]  . sulfamethoxazole-trimethoprim (BACTRIM DS,SEPTRA DS) 800-160 MG tablet    Sig: Take 1 tablet by mouth 2 (two) times daily.    Dispense:  20 tablet    Refill:  0    Order Specific Question:   Supervising Provider    Answer:   Mikey Kirschner [[id number]]   Start pantoprazole 40 mg twice a day. Carafate for breakthrough symptoms. Avoid excessive use of NSAIDs. With no insurance, prescribed Bactrim DS to cover possible MRSA and lower cost. Warm sitz baths. Warning signs reviewed. Call back in 4 days if no significant improvement, call or go to ED sooner if worse.

## 2016-10-22 ENCOUNTER — Encounter: Payer: Self-pay | Admitting: Nurse Practitioner

## 2016-10-22 ENCOUNTER — Other Ambulatory Visit: Payer: Self-pay | Admitting: Nurse Practitioner

## 2016-10-22 MED ORDER — CLINDAMYCIN HCL 300 MG PO CAPS: 300.0000 mg | ORAL_CAPSULE | Freq: Three times a day (TID) | ORAL | 0 refills | Status: DC

## 2016-12-03 ENCOUNTER — Other Ambulatory Visit: Payer: Self-pay | Admitting: Nurse Practitioner

## 2016-12-03 ENCOUNTER — Encounter: Payer: Self-pay | Admitting: Nurse Practitioner

## 2016-12-03 MED ORDER — LISINOPRIL-HYDROCHLOROTHIAZIDE 20-25 MG PO TABS: 1.0000 | ORAL_TABLET | Freq: Every day | ORAL | 2 refills | Status: DC

## 2017-04-07 ENCOUNTER — Encounter: Payer: Self-pay | Admitting: Gastroenterology

## 2017-05-18 ENCOUNTER — Other Ambulatory Visit: Payer: Self-pay | Admitting: Family Medicine

## 2017-05-18 ENCOUNTER — Encounter: Payer: Self-pay | Admitting: Nurse Practitioner

## 2017-05-18 MED ORDER — SULFAMETHOXAZOLE-TRIMETHOPRIM 800-160 MG PO TABS: 1.0000 | ORAL_TABLET | Freq: Two times a day (BID) | ORAL | 0 refills | Status: DC

## 2017-07-13 ENCOUNTER — Other Ambulatory Visit: Payer: Self-pay | Admitting: Nurse Practitioner

## 2017-07-13 MED ORDER — CLONAZEPAM 0.5 MG PO TABS: ORAL_TABLET | ORAL | 2 refills | Status: DC

## 2017-07-14 ENCOUNTER — Encounter: Payer: Self-pay | Admitting: Nurse Practitioner

## 2017-07-15 ENCOUNTER — Encounter: Payer: Self-pay | Admitting: Nurse Practitioner

## 2017-07-15 NOTE — Telephone Encounter (Signed)
Mother made an appt for today for recheck

## 2017-07-18 DIAGNOSIS — R0789 Other chest pain: Secondary | ICD-10-CM | POA: Diagnosis not present

## 2017-08-26 ENCOUNTER — Encounter: Payer: Self-pay | Admitting: Nurse Practitioner

## 2017-09-06 DIAGNOSIS — L821 Other seborrheic keratosis: Secondary | ICD-10-CM | POA: Diagnosis not present

## 2017-09-06 DIAGNOSIS — D2262 Melanocytic nevi of left upper limb, including shoulder: Secondary | ICD-10-CM | POA: Diagnosis not present

## 2017-09-06 DIAGNOSIS — D485 Neoplasm of uncertain behavior of skin: Secondary | ICD-10-CM | POA: Diagnosis not present

## 2017-09-06 DIAGNOSIS — D235 Other benign neoplasm of skin of trunk: Secondary | ICD-10-CM | POA: Diagnosis not present

## 2017-09-06 DIAGNOSIS — D2261 Melanocytic nevi of right upper limb, including shoulder: Secondary | ICD-10-CM | POA: Diagnosis not present

## 2017-09-06 DIAGNOSIS — D225 Melanocytic nevi of trunk: Secondary | ICD-10-CM | POA: Diagnosis not present

## 2017-09-14 ENCOUNTER — Telehealth: Payer: Self-pay | Admitting: Nurse Practitioner

## 2017-09-14 ENCOUNTER — Encounter: Payer: Self-pay | Admitting: Nurse Practitioner

## 2017-09-14 ENCOUNTER — Telehealth: Payer: Self-pay | Admitting: Family Medicine

## 2017-09-14 NOTE — Telephone Encounter (Signed)
Patient stated she wanted to wait for Theresa [name] to see message when she returned 09/15/17.  Patient advised to got to urgent care or ER if she gets worse tonight. Patient verbalized understanding.

## 2017-09-14 NOTE — Telephone Encounter (Signed)
Review skin pathology report in results folder from [facility].

## 2017-09-14 NOTE — Telephone Encounter (Signed)
Patient has been sending emails to [facility]. Hoyle Sauer was going to call her in an antibiotic but now patient is calling with a different pharmacy because she said she has gotten very uncomfortable with symptoms and wants to use the pharmacy across from her work to get it quicker.  Would like RX sent to [facility] in [place].  I told patient Hoyle Sauer is not here today so no guarantees.    Her wk # is [phone].

## 2017-09-14 NOTE — Telephone Encounter (Signed)
Clark's nevus-dysplastic nevus-followed by dermatology

## 2017-09-15 ENCOUNTER — Other Ambulatory Visit: Payer: Self-pay | Admitting: Nurse Practitioner

## 2017-09-15 MED ORDER — CIPROFLOXACIN HCL 500 MG PO TABS: 500.0000 mg | ORAL_TABLET | Freq: Two times a day (BID) | ORAL | 0 refills | Status: DC

## 2017-09-15 NOTE — Telephone Encounter (Signed)
Left message to return call 

## 2017-09-15 NOTE — Telephone Encounter (Signed)
Pt.notified

## 2017-09-15 NOTE — Telephone Encounter (Signed)
I sent an email requesting which pharmacy. Just sent in antibiotic to [facility]. Office visit if no improvement.

## 2017-09-21 DIAGNOSIS — H5203 Hypermetropia, bilateral: Secondary | ICD-10-CM | POA: Diagnosis not present

## 2017-10-02 ENCOUNTER — Other Ambulatory Visit: Payer: Self-pay | Admitting: Nurse Practitioner

## 2017-10-03 ENCOUNTER — Encounter: Payer: Self-pay | Admitting: Nurse Practitioner

## 2017-10-03 ENCOUNTER — Ambulatory Visit: Payer: BLUE CROSS/BLUE SHIELD | Admitting: Nurse Practitioner

## 2017-10-03 VITALS — BP 122/82 | Ht 65.0 in | Wt 224.1 lb

## 2017-10-03 DIAGNOSIS — R7301 Impaired fasting glucose: Secondary | ICD-10-CM | POA: Diagnosis not present

## 2017-10-03 DIAGNOSIS — G43009 Migraine without aura, not intractable, without status migrainosus: Secondary | ICD-10-CM

## 2017-10-03 DIAGNOSIS — Z79899 Other long term (current) drug therapy: Secondary | ICD-10-CM

## 2017-10-03 DIAGNOSIS — Z13220 Encounter for screening for lipoid disorders: Secondary | ICD-10-CM

## 2017-10-03 DIAGNOSIS — R5383 Other fatigue: Secondary | ICD-10-CM

## 2017-10-03 DIAGNOSIS — I10 Essential (primary) hypertension: Secondary | ICD-10-CM | POA: Diagnosis not present

## 2017-10-03 MED ORDER — CYCLOBENZAPRINE HCL 10 MG PO TABS: 10.0000 mg | ORAL_TABLET | Freq: Every day | ORAL | 0 refills | Status: DC

## 2017-10-03 MED ORDER — KETOROLAC TROMETHAMINE 10 MG PO TABS: 10.0000 mg | ORAL_TABLET | Freq: Four times a day (QID) | ORAL | 0 refills | Status: DC | PRN

## 2017-10-03 MED ORDER — LISINOPRIL-HYDROCHLOROTHIAZIDE 20-25 MG PO TABS: 1.0000 | ORAL_TABLET | Freq: Every day | ORAL | 5 refills | Status: DC

## 2017-10-03 MED ORDER — RIZATRIPTAN BENZOATE 10 MG PO TBDP: 10.0000 mg | ORAL_TABLET | ORAL | 0 refills | Status: DC | PRN

## 2017-10-03 NOTE — Patient Instructions (Signed)
Take an Aleve with Maxalt for migraine

## 2017-10-04 ENCOUNTER — Encounter: Payer: Self-pay | Admitting: Nurse Practitioner

## 2017-10-04 DIAGNOSIS — G43009 Migraine without aura, not intractable, without status migrainosus: Secondary | ICD-10-CM | POA: Insufficient documentation

## 2017-10-04 NOTE — Progress Notes (Signed)
Subjective: Presents for complaints of fatigue over the past month.  Has a history of migraines.  They had resolved for years but then came back recently.  Has 1 about every 3 to 4 weeks.  Has a current headache that is been going on for about 5 days which varies in intensity.  Has had an abdominal hysterectomy but still has her right ovary.  Questioning whether the headaches might be due to hormonal changes.  Before when she had headaches was referred to the headache clinic but she decided not to go.  Describes headaches as a spell starting with lightheadedness, then nausea with rare vomiting.  Some visual changes.  Then the headache will start with constant throbbing.  This is similar to the headaches that she had before.  Photosensitivity.  No phonophobia.  Slight brief relief with BC powder.  Does not take excessive analgesics.  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  Has had several spikes in her blood pressure, went to urgent care on 07/01/2017 for a BP of 159/131, had an EKG done which she states was normal.  Has a record of her home blood pressures which overall are normal with a few spikes.  Had a normal eye exam 2 weeks ago.  No fever.  Had a tick bite on her leg 2 weeks ago but no rash.  Is in a new relationship and has been very happy.  Has been off her Celexa for about a year.  States reflux overall has been stable, only worse with certain foods.  Has had some muscle spasms in her lower legs which have caused some soreness.  Objective:   BP 122/82   Ht 5\' 5"  (1.651 m)   Wt 224 lb 0.8 oz (101.6 kg)   BMI 37.28 kg/m  NAD.  Alert, oriented.  Cheerful mildly anxious affect.  Thoughts logical coherent and relevant.  Dressed appropriately.  Making good eye contact.  Pupils equal and reactive to light.  EOMs intact without nystagmus.  TMs minimal clear effusion.  Pharynx clear.  Neck supple with minimal adenopathy.  Thyroid normal limit to palpation, no goiter mass or  tenderness.  Lungs clear.  Heart regular rate and rhythm.  Point-to-point localization normal limit.  Hand strength 5+ Polite.  Patellar reflexes normal.  Romberg negative.  Gait normal.  Assessment:   Problem List Items Addressed This Visit      Cardiovascular and Mediastinum   Essential hypertension, benign - Primary   Relevant Medications   lisinopril-hydrochlorothiazide (PRINZIDE,ZESTORETIC) 20-25 MG tablet   Other Relevant Orders   Basic metabolic panel   Migraine without aura and without status migrainosus, not intractable   Relevant Medications   lisinopril-hydrochlorothiazide (PRINZIDE,ZESTORETIC) 20-25 MG tablet   rizatriptan (MAXALT-MLT) 10 MG disintegrating tablet   ketorolac (TORADOL) 10 MG tablet   cyclobenzaprine (FLEXERIL) 10 MG tablet     Endocrine   IFG (impaired fasting glucose)   Relevant Orders   Hemoglobin A1c     Other   Fatigue   Relevant Orders   CBC with Differential/Platelet   TSH   VITAMIN D 25 Hydroxy (Vit-D Deficiency, Fractures)    Other Visit Diagnoses    Lipid screening       Relevant Orders   Lipid panel   High risk medication use       Relevant Orders   Hepatic function panel       Plan:   Meds ordered this encounter  Medications  . lisinopril-hydrochlorothiazide (PRINZIDE,ZESTORETIC) 20-25  MG tablet    Sig: Take 1 tablet by mouth daily.    Dispense:  30 tablet    Refill:  5    Order Specific Question:   Supervising Provider    Answer:   Mikey Kirschner [[id number]]  . rizatriptan (MAXALT-MLT) 10 MG disintegrating tablet    Sig: Take 1 tablet (10 mg total) by mouth as needed for migraine. May repeat in 2 hours if needed; max 2 per 24 hours    Dispense:  10 tablet    Refill:  0    Order Specific Question:   Supervising Provider    Answer:   Mikey Kirschner [[id number]]  . ketorolac (TORADOL) 10 MG tablet    Sig: Take 1 tablet (10 mg total) by mouth every 6 (six) hours as needed. For migraine; no more than 5 days in a row    Dispense:   30 tablet    Refill:  0    Order Specific Question:   Supervising Provider    Answer:   Mikey Kirschner [[id number]]  . cyclobenzaprine (FLEXERIL) 10 MG tablet    Sig: Take 1 tablet (10 mg total) by mouth at bedtime. Prn muscle spasms    Dispense:  30 tablet    Refill:  0    Order Specific Question:   Supervising Provider    Answer:   Mikey Kirschner [[id number]]   Discussed importance of stress reduction regular activity and weight loss.  Continue to monitor her BP outside of the office.  If consistently above 140/90 patient to contact us.  Reviewed warning signs and symptomatic care for migraines.  Patient defers daily med at this time since headaches are only occurring about once a month.  Flexeril given for muscle spasms.  Lab work pending.  Further follow-up based on results.  Recheck here if symptoms worsen or persist. Return in about 2 months (around 12/03/2017) for before 7/31 if she wants. 25 minutes was spent with the patient.  This statement verifies that 25 minutes was indeed spent with the patient. Greater than half the time was spent in discussion, counseling and answering questions  regarding the issues that the patient came in for today as reflected in the diagnosis (s) please refer to documentation for further details.

## 2017-10-05 DIAGNOSIS — R7301 Impaired fasting glucose: Secondary | ICD-10-CM | POA: Diagnosis not present

## 2017-10-05 DIAGNOSIS — I10 Essential (primary) hypertension: Secondary | ICD-10-CM | POA: Diagnosis not present

## 2017-10-05 DIAGNOSIS — Z13220 Encounter for screening for lipoid disorders: Secondary | ICD-10-CM | POA: Diagnosis not present

## 2017-10-05 DIAGNOSIS — R5383 Other fatigue: Secondary | ICD-10-CM | POA: Diagnosis not present

## 2017-10-05 DIAGNOSIS — Z79899 Other long term (current) drug therapy: Secondary | ICD-10-CM | POA: Diagnosis not present

## 2017-10-06 ENCOUNTER — Telehealth: Payer: Self-pay | Admitting: Nurse Practitioner

## 2017-10-06 ENCOUNTER — Encounter: Payer: Self-pay | Admitting: Nurse Practitioner

## 2017-10-06 LAB — HEPATIC FUNCTION PANEL
ALT: 24 IU/L (ref 0–32)
AST: 17 IU/L (ref 0–40)
Albumin: 4 g/dL (ref 3.5–5.5)
Alkaline Phosphatase: 71 IU/L (ref 39–117)
Bilirubin Total: 0.8 mg/dL (ref 0.0–1.2)
Bilirubin, Direct: 0.18 mg/dL (ref 0.00–0.40)
Total Protein: 6.8 g/dL (ref 6.0–8.5)

## 2017-10-06 LAB — HEMOGLOBIN A1C
Est. average glucose Bld gHb Est-mCnc: 111 mg/dL
Hgb A1c MFr Bld: 5.5 % (ref 4.8–5.6)

## 2017-10-06 LAB — CBC WITH DIFFERENTIAL/PLATELET
Basophils Absolute: 0 10*3/uL (ref 0.0–0.2)
Basos: 1 %
EOS (ABSOLUTE): 0.3 10*3/uL (ref 0.0–0.4)
Eos: 5 %
Hematocrit: 36.5 % (ref 34.0–46.6)
Hemoglobin: 12.4 g/dL (ref 11.1–15.9)
Immature Grans (Abs): 0 10*3/uL (ref 0.0–0.1)
Immature Granulocytes: 0 %
Lymphocytes Absolute: 1.6 10*3/uL (ref 0.7–3.1)
Lymphs: 28 %
MCH: 30.8 pg (ref 26.6–33.0)
MCHC: 34 g/dL (ref 31.5–35.7)
MCV: 91 fL (ref 79–97)
Monocytes Absolute: 0.5 10*3/uL (ref 0.1–0.9)
Monocytes: 9 %
Neutrophils Absolute: 3.3 10*3/uL (ref 1.4–7.0)
Neutrophils: 57 %
Platelets: 273 10*3/uL (ref 150–450)
RBC: 4.03 x10E6/uL (ref 3.77–5.28)
RDW: 13.8 % (ref 12.3–15.4)
WBC: 5.7 10*3/uL (ref 3.4–10.8)

## 2017-10-06 LAB — BASIC METABOLIC PANEL
BUN/Creatinine Ratio: 21 (ref 9–23)
BUN: 17 mg/dL (ref 6–24)
CO2: 26 mmol/L (ref 20–29)
Calcium: 9.6 mg/dL (ref 8.7–10.2)
Chloride: 103 mmol/L (ref 96–106)
Creatinine, Ser: 0.81 mg/dL (ref 0.57–1.00)
GFR calc Af Amer: 101 mL/min/{1.73_m2} (ref >59)
GFR calc non Af Amer: 87 mL/min/{1.73_m2} (ref >59)
Glucose: 109 mg/dL — ABNORMAL HIGH (ref 65–99)
Potassium: 4.5 mmol/L (ref 3.5–5.2)
Sodium: 141 mmol/L (ref 134–144)

## 2017-10-06 LAB — LIPID PANEL
Chol/HDL Ratio: 3 ratio (ref 0.0–4.4)
Cholesterol, Total: 194 mg/dL (ref 100–199)
HDL: 64 mg/dL (ref >39)
LDL Calculated: 110 mg/dL — ABNORMAL HIGH (ref 0–99)
Triglycerides: 101 mg/dL (ref 0–149)
VLDL Cholesterol Cal: 20 mg/dL (ref 5–40)

## 2017-10-06 LAB — VITAMIN D 25 HYDROXY (VIT D DEFICIENCY, FRACTURES): Vit D, 25-Hydroxy: 27.8 ng/mL — ABNORMAL LOW (ref 30.0–100.0)

## 2017-10-06 LAB — TSH: TSH: 1.58 u[IU]/mL (ref 0.450–4.500)

## 2017-10-06 NOTE — Telephone Encounter (Signed)
Review blood work results from [facility] in [place].

## 2017-10-07 NOTE — Telephone Encounter (Signed)
Tried to contact patient; voicemail is full unable to leave message

## 2017-10-07 NOTE — Telephone Encounter (Signed)
Please send copy of labs if she wants them. There is nothing on the labs to explain her fatigue. Her vitamin D level is just below normal so recommend OTC 1000 units per day. Recommend office visit for recheck of fatigue and headaches if this persists.

## 2017-10-07 NOTE — Telephone Encounter (Signed)
Pt returned call. Pt wanted labs faxed to work fax machine. Consulted with office manager and sent lab results via fax to pt work fax number [phone])

## 2017-10-21 ENCOUNTER — Telehealth: Payer: Self-pay | Admitting: Nurse Practitioner

## 2017-10-21 NOTE — Telephone Encounter (Signed)
Mail box is full. I was going to call the pt to see what was going and suggest for this type of medication we need to see the pt for.

## 2017-10-21 NOTE — Telephone Encounter (Signed)
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  She said she had a moment the other day where she got really upset and her friend gave her a Diazepam which worked really well for her.  She wants to know if it would be an option to have this prescribed to her?  [employer]

## 2017-10-24 NOTE — Telephone Encounter (Signed)
Patient scheduled an office visit with Hoyle Sauer to discuss

## 2017-10-28 ENCOUNTER — Ambulatory Visit: Payer: BLUE CROSS/BLUE SHIELD | Admitting: Nurse Practitioner

## 2017-10-28 ENCOUNTER — Encounter: Payer: Self-pay | Admitting: Nurse Practitioner

## 2017-10-28 VITALS — BP 128/84 | Ht 65.0 in | Wt 226.8 lb

## 2017-10-28 DIAGNOSIS — R350 Frequency of micturition: Secondary | ICD-10-CM | POA: Diagnosis not present

## 2017-10-28 DIAGNOSIS — F419 Anxiety disorder, unspecified: Secondary | ICD-10-CM

## 2017-10-28 DIAGNOSIS — R2242 Localized swelling, mass and lump, left lower limb: Secondary | ICD-10-CM

## 2017-10-28 DIAGNOSIS — I10 Essential (primary) hypertension: Secondary | ICD-10-CM | POA: Diagnosis not present

## 2017-10-28 LAB — POCT URINALYSIS DIPSTICK
Spec Grav, UA: 1.02 (ref 1.010–1.025)
pH, UA: 5 (ref 5.0–8.0)

## 2017-10-28 MED ORDER — LORAZEPAM 0.5 MG PO TABS: ORAL_TABLET | ORAL | 0 refills | Status: DC

## 2017-10-28 NOTE — Patient Instructions (Signed)
Try taking 1/2 of the BP pill per day; stop med and call if urinary issues continue

## 2017-10-29 ENCOUNTER — Encounter: Payer: Self-pay | Admitting: Nurse Practitioner

## 2017-10-29 NOTE — Addendum Note (Signed)
Addended by: Nilda Simmer on: 10/29/2017 12:05 PM   Modules accepted: Orders

## 2017-10-29 NOTE — Progress Notes (Addendum)
Subjective: Presents for recheck on her blood pressure and anxiety.  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  BP has improved.  Defers need for daily medication but would like to continue a as needed medication for extreme anxiety. PMP reviewed. Last refilled Klonopin on 6/10. Did not have her medication with her one night and a friend gave her 1/2 of one of her pills, she thinks it was Diazepam. States Xanax made her sleepy. Also c/o recurrent urinary frequency. Always occurs after she has taken her BP for about 4 days in a row. Describes frequent urination then she will develop dysuria. Better with cranberry supplement.  Is not having the symptoms today.  Also c/o protruding bone on top of the left foot that has been there for years after an injury. Now causing pain and problems with gait. Requesting podiatry referral.   Objective:   BP 128/84   Ht 5\' 5"  (1.651 m)   Wt 226 lb 12.8 oz (102.9 kg)   BMI 37.74 kg/m  NAD. Alert, oriented. Lungs clear. Heart RRR. Mildly anxious affect. Thoughts logical coherent and relevant.  Dressed appropriately.  Making good eye contact.  Bony prominence noted with mild erythema and tenderness on the top of the left foot near the base of the great toe. Results for orders placed or performed in visit on 10/28/17  POCT Urinalysis Dipstick  Result Value Ref Range   Color, UA     Clarity, UA     Glucose, UA  Negative   Bilirubin, UA     Ketones, UA     Spec Grav, UA 1.020 1.010 - 1.025   Blood, UA     pH, UA 5.0 5.0 - 8.0   Protein, UA  Negative   Urobilinogen, UA  0.2 or 1.0 E.U./dL   Nitrite, UA     Leukocytes, UA  Negative   Appearance     Odor       Assessment:   Problem List Items Addressed This Visit      Cardiovascular and Mediastinum   Essential hypertension, benign - Primary     Other   Anxiety   Relevant Medications   LORazepam (ATIVAN) 0.5 MG tablet    Other Visit Diagnoses    Urinary frequency       Relevant Orders   POCT Urinalysis Dipstick (Completed)   Mass of left foot       Relevant Orders   Ambulatory referral to Podiatry        Plan:   Meds ordered this encounter  Medications  . LORazepam (ATIVAN) 0.5 MG tablet    Sig: Take 1/2 -1 tab po BID prn anxiety; caution drowsiness    Dispense:  30 tablet    Refill:  0    Order Specific Question:   Supervising Provider    Answer:   Mikey Kirschner [[id number]]   Trial of Lorazepam, use sparingly.  DC if any adverse effects. Decrease Zestoretic to half dose.  Monitor BP, call back if further problems. Will refer to podiatry for evaluation of her foot issue.   Discussed importance of stress reduction.  Defers daily medication at this point. Return if symptoms worsen or fail to improve. 25 minutes was spent with the patient.  This statement verifies that 25 minutes was indeed spent with the patient.  More than 50% of this visit-total duration of the visit-was spent in counseling and coordination of care. The issues that the patient came in  for today as reflected in the diagnosis (s) please refer to documentation for further details.

## 2017-11-01 ENCOUNTER — Encounter: Payer: Self-pay | Admitting: Nurse Practitioner

## 2017-11-09 ENCOUNTER — Other Ambulatory Visit: Payer: Self-pay | Admitting: Nurse Practitioner

## 2017-11-09 ENCOUNTER — Encounter (INDEPENDENT_AMBULATORY_CARE_PROVIDER_SITE_OTHER): Payer: Self-pay

## 2017-11-09 ENCOUNTER — Encounter: Payer: Self-pay | Admitting: Family Medicine

## 2017-11-09 MED ORDER — DIAZEPAM 5 MG PO TABS: ORAL_TABLET | ORAL | 0 refills | Status: DC

## 2017-11-23 ENCOUNTER — Ambulatory Visit: Payer: BLUE CROSS/BLUE SHIELD | Admitting: Nurse Practitioner

## 2017-11-24 ENCOUNTER — Ambulatory Visit: Payer: BLUE CROSS/BLUE SHIELD | Admitting: Nurse Practitioner

## 2017-11-24 ENCOUNTER — Encounter: Payer: Self-pay | Admitting: Nurse Practitioner

## 2017-11-24 VITALS — BP 122/80 | Ht 65.0 in | Wt 233.1 lb

## 2017-11-24 DIAGNOSIS — I10 Essential (primary) hypertension: Secondary | ICD-10-CM

## 2017-11-24 DIAGNOSIS — R7303 Prediabetes: Secondary | ICD-10-CM

## 2017-11-24 DIAGNOSIS — F419 Anxiety disorder, unspecified: Secondary | ICD-10-CM | POA: Diagnosis not present

## 2017-11-25 ENCOUNTER — Telehealth: Payer: Self-pay | Admitting: Nurse Practitioner

## 2017-11-25 ENCOUNTER — Other Ambulatory Visit: Payer: Self-pay | Admitting: Nurse Practitioner

## 2017-11-25 ENCOUNTER — Encounter: Payer: Self-pay | Admitting: Nurse Practitioner

## 2017-11-25 MED ORDER — CLINDAMYCIN HCL 300 MG PO CAPS: 300.0000 mg | ORAL_CAPSULE | Freq: Three times a day (TID) | ORAL | 0 refills | Status: DC

## 2017-11-25 NOTE — Telephone Encounter (Signed)
See my chart message. Antibiotics ordered.

## 2017-11-25 NOTE — Telephone Encounter (Signed)
Pt contacted office stating she has a boil on her buttocks. This happened last year and provider lanced it. Pt states that it is red and swollen. Advise patient to go to Urgent Care or ED. Pt states she is not going to ED or Urgent Care. Pt wanted to know if provider could send in ABT until she can call Monday and get an appt. Please advise.

## 2017-11-26 ENCOUNTER — Encounter: Payer: Self-pay | Admitting: Nurse Practitioner

## 2017-11-26 DIAGNOSIS — R7303 Prediabetes: Secondary | ICD-10-CM | POA: Insufficient documentation

## 2017-11-26 NOTE — Progress Notes (Signed)
Subjective:  Presents for routine follow up of HTN. Has been trying to eat healthy and lose weight but has been struggling. Taking 1/2 pill of her BP med which is working well. Denies CP/ischemic type pain or SOB. No visual changes. No difficulty speaking or swallowing. No numbness or weakness of the face, arms or legs.  Takes a rare half of the Valium, has only taken 3 total since it was prescribed.  Will occasionally take an extra half of a blood pressure pill if she is upset and her blood pressure is high.   Objective:   BP 122/80   Ht 5\' 5"  (1.651 m)   Wt 233 lb 0.8 oz (105.7 kg)   BMI 38.78 kg/m  NAD.  Alert, oriented.  Lungs clear.  Heart regular rate and rhythm.  Carotids no bruits or thrills.  Lower extremities no edema.  Assessment:   Problem List Items Addressed This Visit      Cardiovascular and Mediastinum   Essential hypertension, benign - Primary     Other   Anxiety   Prediabetes       Plan: Continue current regimen as directed.  Encouraged regular exercise healthy diet and continued weight loss efforts.  Will refer to dietitian for counseling.  Continue to use Valium sparingly. Return in about 6 months (around 05/27/2018) for BP recheck .

## 2017-11-28 ENCOUNTER — Encounter (INDEPENDENT_AMBULATORY_CARE_PROVIDER_SITE_OTHER): Payer: Self-pay

## 2017-11-29 DIAGNOSIS — L720 Epidermal cyst: Secondary | ICD-10-CM | POA: Diagnosis not present

## 2017-12-12 ENCOUNTER — Ambulatory Visit: Payer: Self-pay | Admitting: Skilled Nursing Facility1

## 2017-12-19 DIAGNOSIS — M205X2 Other deformities of toe(s) (acquired), left foot: Secondary | ICD-10-CM | POA: Diagnosis not present

## 2017-12-19 DIAGNOSIS — M79675 Pain in left toe(s): Secondary | ICD-10-CM | POA: Diagnosis not present

## 2018-01-03 ENCOUNTER — Other Ambulatory Visit: Payer: Self-pay | Admitting: Podiatry

## 2018-01-13 ENCOUNTER — Encounter: Payer: Self-pay | Admitting: Licensed Clinical Social Worker

## 2018-01-16 ENCOUNTER — Other Ambulatory Visit (HOSPITAL_COMMUNITY): Payer: Self-pay

## 2018-01-25 ENCOUNTER — Ambulatory Visit: Admit: 2018-01-25 | Payer: Self-pay

## 2018-01-25 SURGERY — CHEILECTOMY
Anesthesia: Monitor Anesthesia Care | Laterality: Left

## 2018-02-12 DIAGNOSIS — J209 Acute bronchitis, unspecified: Secondary | ICD-10-CM | POA: Diagnosis not present

## 2018-02-12 DIAGNOSIS — J069 Acute upper respiratory infection, unspecified: Secondary | ICD-10-CM | POA: Diagnosis not present

## 2018-04-13 ENCOUNTER — Ambulatory Visit: Payer: BLUE CROSS/BLUE SHIELD | Admitting: Family Medicine

## 2018-04-13 ENCOUNTER — Encounter: Payer: Self-pay | Admitting: Family Medicine

## 2018-04-13 ENCOUNTER — Telehealth: Payer: Self-pay | Admitting: Family Medicine

## 2018-04-13 VITALS — BP 120/82 | Ht 65.0 in | Wt 231.8 lb

## 2018-04-13 DIAGNOSIS — R079 Chest pain, unspecified: Secondary | ICD-10-CM

## 2018-04-13 DIAGNOSIS — R0602 Shortness of breath: Secondary | ICD-10-CM | POA: Diagnosis not present

## 2018-04-13 DIAGNOSIS — K219 Gastro-esophageal reflux disease without esophagitis: Secondary | ICD-10-CM | POA: Diagnosis not present

## 2018-04-13 DIAGNOSIS — F419 Anxiety disorder, unspecified: Secondary | ICD-10-CM

## 2018-04-13 DIAGNOSIS — I998 Other disorder of circulatory system: Secondary | ICD-10-CM | POA: Diagnosis not present

## 2018-04-13 MED ORDER — DEXLANSOPRAZOLE 60 MG PO CPDR: 60.0000 mg | DELAYED_RELEASE_CAPSULE | Freq: Every day | ORAL | 2 refills | Status: DC

## 2018-04-13 MED ORDER — DIAZEPAM 5 MG PO TABS: ORAL_TABLET | ORAL | 0 refills | Status: DC

## 2018-04-13 NOTE — Telephone Encounter (Signed)
Pt is needing a PA done on dexlansoprazole (DEXILANT) 60 MG capsule. Pt's pharmacy notified her. She would also like to know the process of having a PA done.

## 2018-04-13 NOTE — Progress Notes (Signed)
Subjective:    Patient ID: Theresa [name], female    DOB: 10/06/1970, 47 y.o.   MRN: [id number]  HPI  Patient arrives with with elevated blood pressure issues and also having a flare of reflux and would like refill of med. Patient states she has stress induced hypertension. Patient states she also just had labs  Reports BP fluctuates, the other night BP was 140/98, felt chest tightness and pressure to left side and shortness of breath at the time. Reports she will stop and rest and symptoms ease up after about 10-15 minutes. Reports symptoms over the last couple weeks, feels like it is r/t to stress. 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   Also reports nausea and burning in chest, hx of GERD. Thinks this is what it is. Reports nausea 3-4x per day everyday. Vomiting occasionally, twice per week, once last night.  Does not seem to be r/t to meals. Symptoms getting worse over the last 3 months. Have taken nexium and prevacid previously for GERD, reports dexilant was the most helpful for her.   Reports hx of diverticulitis. Reports sometimes has abdominal pain, LLQ, last occurred earlier this week. Reports described as sharp. Denies diarrhea. Reports constipation, LBM yesterday. No blood in stool.   Requesting refill on valium, Takes 1/2 tablet prn, has been taking maybe 4-5 times over the last 6 weeks.   Review of Systems  Constitutional: Negative for diaphoresis and fever.  Respiratory: Positive for chest tightness and shortness of breath.   Cardiovascular: Positive for chest pain.  Gastrointestinal: Positive for abdominal pain and nausea. Negative for blood in stool, diarrhea and vomiting.       Objective:   Physical Exam Vitals signs and nursing note reviewed.  Constitutional:      General: She is not in acute distress. HENT:     Head: Normocephalic and atraumatic.     Right Ear: External ear normal.     Left Ear: External ear normal.     Mouth/Throat:     Mouth: Mucous  membranes are moist.     Pharynx: Oropharynx is clear.  Eyes:     General:        Right eye: No discharge.        Left eye: No discharge.  Neck:     Musculoskeletal: Neck supple.  Cardiovascular:     Rate and Rhythm: Normal rate and regular rhythm.     Pulses: Normal pulses.     Heart sounds: Normal heart sounds.  Pulmonary:     Effort: Pulmonary effort is normal. No respiratory distress.     Breath sounds: Normal breath sounds.  Abdominal:     General: Bowel sounds are normal. There is no distension.     Palpations: Abdomen is soft. There is no mass.     Tenderness: There is abdominal tenderness in the epigastric area and left upper quadrant. There is no guarding.     Comments: No LLQ tenderness  Skin:    General: Skin is warm and dry.  Neurological:     Mental Status: She is alert and oriented to person, place, and time.  Psychiatric:        Mood and Affect: Mood normal.           Assessment & Plan:  1. Chest pain, unspecified type - Plan: PR ELECTROCARDIOGRAM, COMPLETE, Ambulatory referral to Cardiology  EKG done today showing NSR with no acute changes, reviewed by myself and Dr. Sallee Lange.  Discussed with patient that I feel her symptoms are unlikely to be cardiac in nature but cannot fully discount this without further workup given the combination of chest pressure, elevated BP, and shortness of breath as well as her age and risk factors. Given this recommend further evaluation and work up by cardiology. Pt is agreeable to this. Warning signs discussed.  2. Fluctuating blood pressure - Plan: Ambulatory referral to Cardiology See #1 3. Shortness of breath - Plan: Ambulatory referral to Cardiology See #1 4. Gastroesophageal reflux disease without esophagitis Pt reports dexilant has been effective in the past in treating her GERD symptoms. Recommend trialing this again, discussed avoiding foods that can irritate her GERD like chocolate, caffeine, alcohol, spicy foods  and tomato based foods. Recommended she keep a diary of her symptoms and any triggers and f/u within 3 months to see how she is doing on the medication.  5. Anxiety Pt requesting refill of her valium, reports occasional use, taking 1/2 tab prn, maybe 1x per week and states is helpful for her increased stress. PMP database checked, will refill.   Dr. Sallee Lange was consulted on this case and is in agreement with the above treatment plan.  25 minutes was spent with the patient.  This statement verifies that 25 minutes was indeed spent with the patient.  More than 50% of this visit-total duration of the visit-was spent in counseling and coordination of care. The issues that the patient came in for today as reflected in the diagnosis (s) please refer to documentation for further details.

## 2018-04-14 ENCOUNTER — Encounter: Payer: Self-pay | Admitting: Family Medicine

## 2018-04-14 NOTE — Telephone Encounter (Signed)
Please look into whatever is necessary for the prior auth and get that started. Thanks!

## 2018-04-21 NOTE — Telephone Encounter (Addendum)
Dexilant approved by insurance 04/19/18-04/17/21. Patient notified

## 2018-05-26 ENCOUNTER — Ambulatory Visit: Payer: BLUE CROSS/BLUE SHIELD | Admitting: Cardiovascular Disease

## 2018-05-26 ENCOUNTER — Encounter: Payer: Self-pay | Admitting: Family Medicine

## 2018-05-26 ENCOUNTER — Encounter: Payer: Self-pay | Admitting: Cardiovascular Disease

## 2018-05-26 VITALS — BP 128/88 | HR 95 | Ht 65.0 in | Wt 241.0 lb

## 2018-05-26 DIAGNOSIS — R079 Chest pain, unspecified: Secondary | ICD-10-CM

## 2018-05-26 DIAGNOSIS — K219 Gastro-esophageal reflux disease without esophagitis: Secondary | ICD-10-CM

## 2018-05-26 DIAGNOSIS — I10 Essential (primary) hypertension: Secondary | ICD-10-CM

## 2018-05-26 NOTE — Progress Notes (Signed)
CARDIOLOGY CONSULT NOTE  Patient ID: MITSUYE SCHRODT MRN: [id number] DOB/AGE: 47-Jul-1972 47 y.o.  Admit date: (Not on file) Primary Physician: Kathyrn Drown, MD Referring Physician:  Kathyrn Drown, MD  Reason for Consultation: Chest pain  HPI: Theresa [name] is a 48 y.o. female who is being seen today for the evaluation of chest pain at the request of Luking, Elayne Snare, MD.   I reviewed notes by her PCP in the EMR.  She has had fluctuating blood pressure, shortness of breath, anxiety, and GERD.  She told me that she has had chest pain off and on for the past several months.  She works 3 jobs and is a single mother.  She has a 53 year old son and a 47 year old daughter.  She was told by her PCP that she has stress-induced hypertension.  Symptoms are on the left side of her chest and are accompanied by shortness of breath, nausea, and dizziness.  Symptoms are alleviated with rest.  She believes symptoms are related to anxiety and stress.  She is a non-smoker and does not do drugs.  Symptoms may occur both at rest and with exertion.  She denies palpitations, orthopnea, and paroxysmal nocturnal dyspnea.  Upon further discussion, she has the sensation of food getting stuck in the middle of her throat.  She also has epigastric and left lower quadrant abdominal pain after eating.   LDL 110 on 10/05/2017.  Other parameters were within normal limits.  A1c normal at 5.5%.  TSH also normal.  I reviewed PCP notes and it appears an ECG was performed in December 2019 and it was unremarkable but I do not have a copy of this.  I will have to request it.  Family history: She had a brother who died at a young age.  She is not involved with her mother or father but does know that her father has had strokes and her mother has stage IV breast cancer.  She thinks both parents may have some form of heart disease.    No Known Allergies  Current Outpatient Medications  Medication Sig  Dispense Refill  . dexlansoprazole (DEXILANT) 60 MG capsule Take 1 capsule (60 mg total) by mouth daily. 30 capsule 2  . diazepam (VALIUM) 5 MG tablet Take 1/2 -1 tablet po daily prn anxiety 30 tablet 0  . ketorolac (TORADOL) 10 MG tablet Take 1 tablet (10 mg total) by mouth every 6 (six) hours as needed. For migraine; no more than 5 days in a row 30 tablet 0  . lisinopril-hydrochlorothiazide (PRINZIDE,ZESTORETIC) 20-25 MG tablet Take 1 tablet by mouth daily. 30 tablet 5  . Magnesium 500 MG TABS Take 1 tablet by mouth daily. Reported on 07/25/2015    . Multiple Vitamin (MULTIVITAMIN) tablet Take 1 tablet by mouth daily. Reported on 07/25/2015    . Probiotic Product (UP4 PROBIOTICS PO) Take by mouth.    . rizatriptan (MAXALT-MLT) 10 MG disintegrating tablet Take 1 tablet (10 mg total) by mouth as needed for migraine. May repeat in 2 hours if needed; max 2 per 24 hours 10 tablet 0  . cyclobenzaprine (FLEXERIL) 10 MG tablet Take 1 tablet (10 mg total) by mouth at bedtime. Prn muscle spasms (Patient not taking: Reported on 05/26/2018) 30 tablet 0   No current facility-administered medications for this visit.     Past Medical History:  Diagnosis Date  . Anxiety   . Depression   . Fibromyalgia   . GERD (  gastroesophageal reflux disease)   . Headache(784.0)   . Heart murmur   . IFG (impaired fasting glucose)   . Obsessive-compulsive disorder   . Osteoarthritis   . Seizures ([place])    last seizure at age 72-stressed induced seizure.    Past Surgical History:  Procedure Laterality Date  . ABDOMINAL HYSTERECTOMY     still has right ovary  . BIOPSY N/A 03/05/2014   Procedure: DUODENAL AND GASTRIC BIOPSY;  Surgeon: Danie Binder, MD;  Location: [facility];  Service: Endoscopy;  Laterality: N/A;  . bladder sling X 3    . CHOLECYSTECTOMY    . COLONOSCOPY  04/24/2012   JKD:TOIZTIW polyp measuring 5 mm in size was found in the proximal transverse colon; polypectomy was performed with cold forceps  Pedunculated polyp measuring 1.1 cm in size was found in the sigmoid colon; polypectomy was performed using snare cautery Moderate diverticulosis was noted in the descending colon and sigmoid colon small internal hemorrhoids. next tcs 04/2017  . ESOPHAGOGASTRODUODENOSCOPY N/A 02/18/2014   Procedure: ESOPHAGOGASTRODUODENOSCOPY (EGD);  Surgeon: Danie Binder, MD;  Location: [facility];  Service: Endoscopy;  Laterality: N/A;  115  . ESOPHAGOGASTRODUODENOSCOPY (EGD) WITH PROPOFOL N/A 03/05/2014   SLF: 1. dyspepsia due to constipation, gastritis, and GERD 2. Multiple Gastric polyps 3. Moderate non-erosive gastritis 4. Diverticulum in the 2nd part of the duodenum  . TUBAL LIGATION      Social History   Socioeconomic History  . Marital status: Married    Spouse name: Not on file  . Number of children: 2  . Years of education: Not on file  . Highest education level: Not on file  Occupational History  . Occupation: OFFICE MGR    Employer: [employer]    Comment: Radio producer  Social Needs  . Financial resource strain: Not on file  . Food insecurity:    Worry: Not on file    Inability: Not on file  . Transportation needs:    Medical: Not on file    Non-medical: Not on file  Tobacco Use  . Smoking status: Never Smoker  . Smokeless tobacco: Never Used  . Tobacco comment: as a kid  Substance and Sexual Activity  . Alcohol use: Yes    Comment: very rere  . Drug use: No  . Sexual activity: Yes    Birth control/protection: Surgical  Lifestyle  . Physical activity:    Days per week: Not on file    Minutes per session: Not on file  . Stress: Not on file  Relationships  . Social connections:    Talks on phone: Not on file    Gets together: Not on file    Attends religious service: Not on file    Active member of club or organization: Not on file    Attends meetings of clubs or organizations: Not on file    Relationship status: Not on file  . Intimate partner  violence:    Fear of current or ex partner: Not on file    Emotionally abused: Not on file    Physically abused: Not on file    Forced sexual activity: Not on file  Other Topics Concern  . Not on file  Social History Narrative   Separated from her husband; has 2 children      Current Meds  Medication Sig  . dexlansoprazole (DEXILANT) 60 MG capsule Take 1 capsule (60 mg total) by mouth daily.  . diazepam (VALIUM) 5 MG tablet  Take 1/2 -1 tablet po daily prn anxiety  . ketorolac (TORADOL) 10 MG tablet Take 1 tablet (10 mg total) by mouth every 6 (six) hours as needed. For migraine; no more than 5 days in a row  . lisinopril-hydrochlorothiazide (PRINZIDE,ZESTORETIC) 20-25 MG tablet Take 1 tablet by mouth daily.  . Magnesium 500 MG TABS Take 1 tablet by mouth daily. Reported on 07/25/2015  . Multiple Vitamin (MULTIVITAMIN) tablet Take 1 tablet by mouth daily. Reported on 07/25/2015  . Probiotic Product (UP4 PROBIOTICS PO) Take by mouth.  . rizatriptan (MAXALT-MLT) 10 MG disintegrating tablet Take 1 tablet (10 mg total) by mouth as needed for migraine. May repeat in 2 hours if needed; max 2 per 24 hours      Review of systems complete and found to be negative unless listed above in HPI  A nurse/CMA was present throughout the physical exam.   Physical exam Blood pressure 128/88, pulse 95, height 5\' 5"  (1.651 m), weight 241 lb (109.3 kg), SpO2 98 %. General: NAD Neck: No JVD, no thyromegaly or thyroid nodule.  Lungs: Clear to auscultation bilaterally with normal respiratory effort. CV: Nondisplaced PMI. Regular rate and rhythm, normal S1/S2, no S3/S4, no murmur.  No peripheral edema.  No carotid bruit.    Abdomen: Soft, mild epigastric tenderness, no distention.  Skin: Intact without lesions or rashes.  Neurologic: Alert and oriented x 3.  Psych: Normal affect. Extremities: No clubbing or cyanosis.  HEENT: Normal.   ECG: Most recent ECG reviewed.   Labs: Lab Results  Component  Value Date/Time   K 4.5 10/05/2017 09:24 AM   BUN 17 10/05/2017 09:24 AM   CREATININE 0.81 10/05/2017 09:24 AM   CREATININE 0.80 10/21/2015 12:56 PM   ALT 24 10/05/2017 09:24 AM   TSH 1.580 10/05/2017 09:24 AM   HGB 12.4 10/05/2017 09:24 AM     Lipids: Lab Results  Component Value Date/Time   LDLCALC 110 (H) 10/05/2017 09:24 AM   CHOL 194 10/05/2017 09:24 AM   TRIG 101 10/05/2017 09:24 AM   HDL 64 10/05/2017 09:24 AM        ASSESSMENT AND PLAN:  1.  Chest pain: Symptoms appear to be atypical for cardiac etiology.  She does have dysphagia for solids and has epigastric pain and tenderness after eating suggesting a gastrointestinal etiology.  This may certainly be aggravated by increased stress in her life due to her working 3 jobs and being a single mother.  There may be some form of family history of heart disease. I will obtain an exercise tolerance test for further evaluation.  2.  Hypertension: Diastolic blood pressure is marginally elevated.  This will need further monitoring.  3.  GERD: Currently on Dexilant.  She does have dysphagia for solids and epigastric and left lower quadrant pain and tenderness after eating.     Disposition: Follow up to be determined  Signed: Kate Sable, M.D., F.A.C.C.  05/26/2018, 9:43 AM

## 2018-05-26 NOTE — Patient Instructions (Signed)
Medication Instructions:  Your physician recommends that you continue on your current medications as directed. Please refer to the Current Medication list given to you today.  If you need a refill on your cardiac medications before your next appointment, please call your pharmacy.   Lab work: None If you have labs (blood work) drawn today and your tests are completely normal, you will receive your results only by: Marland Kitchen MyChart Message (if you have MyChart) OR . A paper copy in the mail If you have any lab test that is abnormal or we need to change your treatment, we will call you to review the results.  Testing/Procedures: Your physician has requested that you have an exercise tolerance test. For further information please visit [email or web address]. Please also follow instruction sheet, as given.    Follow-Up:We will call you with results   Any Other Special Instructions Will Be Listed Below (If Applicable). NONE

## 2018-05-30 ENCOUNTER — Ambulatory Visit (HOSPITAL_COMMUNITY): Payer: BLUE CROSS/BLUE SHIELD | Attending: Cardiovascular Disease

## 2018-07-12 ENCOUNTER — Encounter: Payer: Self-pay | Admitting: Family Medicine

## 2018-07-12 ENCOUNTER — Ambulatory Visit: Payer: BLUE CROSS/BLUE SHIELD | Admitting: Family Medicine

## 2018-07-12 ENCOUNTER — Other Ambulatory Visit: Payer: Self-pay

## 2018-07-12 VITALS — BP 118/78 | Wt 230.6 lb

## 2018-07-12 DIAGNOSIS — N3000 Acute cystitis without hematuria: Secondary | ICD-10-CM

## 2018-07-12 DIAGNOSIS — R300 Dysuria: Secondary | ICD-10-CM

## 2018-07-12 DIAGNOSIS — R42 Dizziness and giddiness: Secondary | ICD-10-CM | POA: Diagnosis not present

## 2018-07-12 LAB — POCT URINALYSIS DIPSTICK
Spec Grav, UA: 1.01 (ref 1.010–1.025)
pH, UA: 6 (ref 5.0–8.0)

## 2018-07-12 LAB — POCT GLYCOSYLATED HEMOGLOBIN (HGB A1C): Hemoglobin A1C: 5.4 % (ref 4.0–5.6)

## 2018-07-12 MED ORDER — ONDANSETRON 4 MG PO TBDP: ORAL_TABLET | ORAL | 0 refills | Status: DC

## 2018-07-12 MED ORDER — MECLIZINE HCL 25 MG PO TABS: 25.0000 mg | ORAL_TABLET | Freq: Three times a day (TID) | ORAL | 0 refills | Status: DC | PRN

## 2018-07-12 NOTE — Progress Notes (Signed)
   Subjective:    Patient ID: Theresa [name], female    DOB: [birth year], 48 y.o.   MRN: [id number]  Dizziness  This is a new problem. The current episode started 1 to 4 weeks ago. Associated symptoms include coughing, fatigue, headaches and nausea. Associated symptoms comments: Pt was doing the fasting diet prior to these episodes. Pt went to order a pizza one night but it was a one hour wait so she just drunk a swallow of soda. Pt states that night she woke up extremely nauseous. Pt did get relief about 4 hours later while eating peanut butter and honey crackers. Balance trouble, bumping into walls. Pt states she does not wake up each morning with dizzy.Marland Kitchen She has tried rest and NSAIDs for the symptoms. The treatment provided mild relief.  had a spell of dizziness and it hit relativley hard   Pt awoke with nausea,lasted an hour so so after drinking a coke  Room was spinning hard, very unsteady with walkidoing Dry outh and mout ulcers off an on   No major cough r cong or stuffines      Has  Patient concerned about her sugar Pt drove on to work, felt bad Pt has also been getting mouth sores and burning with urination.   Results for orders placed or performed in visit on 07/12/18  POCT HgB A1C  Result Value Ref Range   Hemoglobin A1C 5.4 4.0 - 5.6 %   HbA1c POC (<> result, manual entry)     HbA1c, POC (prediabetic range)     HbA1c, POC (controlled diabetic range)     Review of Systems  Constitutional: Positive for fatigue.  Respiratory: Positive for cough.   Gastrointestinal: Positive for nausea.  Neurological: Positive for dizziness and headaches.       Objective:   Physical Exam Alert active good hydration.  Mild malaise.  Mild nasal congestion TMs retracted pharynx normal lungs clear heart regular rhythm no CVA tenderness  Urinalysis few white blood cells per high-power field       Assessment & Plan:  Impression vertigo.  Discussed at length.  Likely inner ear.   Antivert as needed.  Zofran as needed.  2.  Sugar concerns.  A1c good.  Discussed to maintain same  3.  Potential urinary tract infection.  Discussed.  Will culture urine  Greater than 50% of this 25 minute face to face visit was spent in counseling and discussion and coordination of care regarding the above diagnosis/diagnosies

## 2018-07-14 LAB — SPECIMEN STATUS REPORT

## 2018-07-14 LAB — URINE CULTURE

## 2018-08-03 ENCOUNTER — Encounter: Payer: Self-pay | Admitting: Family Medicine

## 2018-08-03 ENCOUNTER — Other Ambulatory Visit: Payer: Self-pay

## 2018-08-03 ENCOUNTER — Ambulatory Visit (INDEPENDENT_AMBULATORY_CARE_PROVIDER_SITE_OTHER): Payer: BLUE CROSS/BLUE SHIELD | Admitting: Family Medicine

## 2018-08-03 DIAGNOSIS — K5792 Diverticulitis of intestine, part unspecified, without perforation or abscess without bleeding: Secondary | ICD-10-CM | POA: Diagnosis not present

## 2018-08-03 MED ORDER — METRONIDAZOLE 500 MG PO TABS: 500.0000 mg | ORAL_TABLET | Freq: Three times a day (TID) | ORAL | 0 refills | Status: DC

## 2018-08-03 MED ORDER — CIPROFLOXACIN HCL 500 MG PO TABS: 500.0000 mg | ORAL_TABLET | Freq: Two times a day (BID) | ORAL | 0 refills | Status: DC

## 2018-08-03 NOTE — Progress Notes (Signed)
   Subjective:    Patient ID: Theresa [name], female    DOB: 02/16/1971, 48 y.o.   MRN: [id number]  Abdominal Pain  This is a new problem. Episode onset: 2 weeks. The quality of the pain is cramping. Associated symptoms include nausea. Pertinent negatives include no fever or vomiting.  pain on front left side.  Watery stools and a lot of gas.  Has a history of diverticulosis and pt states pain is the exact same.  Patient relates significant left-sided abdominal pain hurts to press on.  No nausea or vomiting no fever chills or sweats currently states energy level is subpar states has a history of diverticulosis and has had problems with diverticulitis in the past and has had to take antibiotics.  She states she does not want to go to the emergency department does not want to do any type of testing for fear of being exposed to coronavirus Virtual Visit via Telephone Note Video conference was attempted twice and could not connect because patient was in perioral area phone call was completed I connected with Theresa [name] on 08/03/18 at  4:00 PM EDT by telephone and verified that I am speaking with the correct person using two identifiers.   I discussed the limitations, risks, security and privacy concerns of performing an evaluation and management service by telephone and the availability of in person appointments. I also discussed with the patient that there may be a patient responsible charge related to this service. The patient expressed understanding and agreed to proceed.   History of Present Illness:    Observations/Objective:   Assessment and Plan:   Follow Up Instructions:    I discussed the assessment and treatment plan with the patient. The patient was provided an opportunity to ask questions and all were answered. The patient agreed with the plan and demonstrated an understanding of the instructions.   The patient was advised to call back or seek an in-person evaluation if  the symptoms worsen or if the condition fails to improve as anticipated. Patient states she has been able to keep food down so far. I provided 15 minutes of non-face-to-face time during this encounter.   Dayton Bailiff, LPN     Review of Systems  Constitutional: Negative for fever.  Gastrointestinal: Positive for abdominal pain and nausea. Negative for vomiting.       Objective:   Physical Exam  Physical exam was not possible      Assessment & Plan:  Probable diverticulitis Cipro twice daily for the next 10 days Flagyl 3 times daily for the next 10 days bland diet clear liquids should gradually improve warning signs were discussed in detail

## 2018-09-05 ENCOUNTER — Ambulatory Visit: Payer: BLUE CROSS/BLUE SHIELD | Admitting: Family Medicine

## 2018-09-05 ENCOUNTER — Encounter: Payer: Self-pay | Admitting: Family Medicine

## 2018-09-05 ENCOUNTER — Other Ambulatory Visit: Payer: Self-pay

## 2018-09-05 VITALS — BP 132/84 | Wt 234.8 lb

## 2018-09-05 DIAGNOSIS — I10 Essential (primary) hypertension: Secondary | ICD-10-CM

## 2018-09-05 DIAGNOSIS — M7581 Other shoulder lesions, right shoulder: Secondary | ICD-10-CM | POA: Diagnosis not present

## 2018-09-05 MED ORDER — DIAZEPAM 5 MG PO TABS: ORAL_TABLET | ORAL | 0 refills | Status: DC

## 2018-09-05 MED ORDER — DICLOFENAC SODIUM 75 MG PO TBEC: 75.0000 mg | DELAYED_RELEASE_TABLET | Freq: Two times a day (BID) | ORAL | 0 refills | Status: DC

## 2018-09-05 NOTE — Progress Notes (Signed)
   Subjective:    Patient ID: Theresa [name], female    DOB: 12-Aug-1970, 48 y.o.   MRN: [id number]  Shoulder Pain   The pain is present in the right shoulder, right arm, right elbow, right wrist and right fingers. This is a new problem. Episode onset: 3 months. Quality: feels like arm is asleep and trying to wake up unless pt extends arm then becomes an ache with some burning.  Associated symptoms include numbness and tingling. Associated symptoms comments: Numbness due to feeling asleep; fingers have swollen. She has tried heat and NSAIDS for the symptoms.   Pt states that her mom had open heart surgery a while ago. Pt bp has been elevated. Pt has not been able to check BP regular. Pt has been taking Valium to get BP down. Had headache last week for about 3 days. Pt constantly has vision problems.   Patient denies being depressed states she uses Valium occasionally  Patient relates the pain in her shoulder was after doing a lot of moving she relates pain in the shoulder radiates down the arm sometimes comes out of the neck occasional pins-and-needles but relates stiffness in the shoulder difficult time putting her arm behind her back Review of Systems  Constitutional: Negative for activity change and appetite change.  HENT: Negative for congestion and rhinorrhea.   Respiratory: Negative for cough and shortness of breath.   Cardiovascular: Negative for chest pain and leg swelling.  Gastrointestinal: Negative for abdominal pain, nausea and vomiting.  Skin: Negative for color change.  Neurological: Positive for tingling and numbness. Negative for dizziness and weakness.  Psychiatric/Behavioral: Negative for agitation and confusion.       Objective:   Physical Exam Vitals signs reviewed.  Constitutional:      General: She is not in acute distress. HENT:     Head: Normocephalic.  Cardiovascular:     Rate and Rhythm: Normal rate and regular rhythm.     Heart sounds: Normal heart sounds.  No murmur.  Pulmonary:     Effort: Pulmonary effort is normal.     Breath sounds: Normal breath sounds.  Lymphadenopathy:     Cervical: No cervical adenopathy.  Neurological:     Mental Status: She is alert.  Psychiatric:        Behavior: Behavior normal.    Patient does have limited range of motion of the shoulder and has difficult time raising her shoulder difficult time putting it behind her back positive apprehension test no weakness in the muscles.  Coordination normal       Assessment & Plan:  Rotator cuff tendinitis Recommend anti-inflammatory next couple weeks also recommend stretching exercises In addition to this range of motion exercises plus rehabilitation if not doing better over the next couple weeks notify us we will set her up with orthopedics  Blood pressure very good control currently.  Continue current medications. Refill of diazepam given for infrequent use at home caution drowsiness Migraines gets them rarely but does use triptans when necessary  Patient will follow-up within 6 months

## 2018-09-05 NOTE — Patient Instructions (Signed)
Shoulder Exercises Ask your health care provider which exercises are safe for you. Do exercises exactly as told by your health care provider and adjust them as directed. It is normal to feel mild stretching, pulling, tightness, or discomfort as you do these exercises, but you should stop right away if you feel sudden pain or your pain gets worse.Do not begin these exercises until told by your health care provider. Range of Motion Exercises        These exercises warm up your muscles and joints and improve the movement and flexibility of your shoulder. These exercises also help to relieve pain, numbness, and tingling. These exercises involve stretching your injured shoulder directly. Exercise A: Pendulum 1. Stand near a wall or a surface that you can hold onto for balance. 2. Bend at the waist and let your left / right arm hang straight down. Use your other arm to support you. Keep your back straight and do not lock your knees. 3. Relax your left / right arm and shoulder muscles, and move your hips and your trunk so your left / right arm swings freely. Your arm should swing because of the motion of your body, not because you are using your arm or shoulder muscles. 4. Keep moving your body so your arm swings in the following directions, as told by your health care provider: ? Side to side. ? Forward and backward. ? In clockwise and counterclockwise circles. 5. Continue each motion for __________ seconds, or for as long as told by your health care provider. 6. Slowly return to the starting position. Repeat __________ times. Complete this exercise __________ times a day. Exercise B:Flexion, Standing 1. Stand and hold a broomstick, a cane, or a similar object. Place your hands a little more than shoulder-width apart on the object. Your left / right hand should be palm-up, and your other hand should be palm-down. 2. Keep your elbow straight and keep your shoulder muscles relaxed. Push the stick  down with your healthy arm to raise your left / right arm in front of your body, and then over your head until you feel a stretch in your shoulder. ? Avoid shrugging your shoulder while you raise your arm. Keep your shoulder blade tucked down toward the middle of your back. 3. Hold for __________ seconds. 4. Slowly return to the starting position. Repeat __________ times. Complete this exercise __________ times a day. Exercise C: Abduction, Standing 1. Stand and hold a broomstick, a cane, or a similar object. Place your hands a little more than shoulder-width apart on the object. Your left / right hand should be palm-up, and your other hand should be palm-down. 2. While keeping your elbow straight and your shoulder muscles relaxed, push the stick across your body toward your left / right side. Raise your left / right arm to the side of your body and then over your head until you feel a stretch in your shoulder. ? Do not raise your arm above shoulder height, unless your health care provider tells you to do that. ? Avoid shrugging your shoulder while you raise your arm. Keep your shoulder blade tucked down toward the middle of your back. 3. Hold for __________ seconds. 4. Slowly return to the starting position. Repeat __________ times. Complete this exercise __________ times a day. Exercise D:Internal Rotation 1. Place your left / right hand behind your back, palm-up. 2. Use your other hand to dangle an exercise band, a towel, or a similar object over your shoulder.   Grasp the band with your left / right hand so you are holding onto both ends. 3. Gently pull up on the band until you feel a stretch in the front of your left / right shoulder. ? Avoid shrugging your shoulder while you raise your arm. Keep your shoulder blade tucked down toward the middle of your back. 4. Hold for __________ seconds. 5. Release the stretch by letting go of the band and lowering your hands. Repeat __________ times.  Complete this exercise __________ times a day. Stretching Exercises  These exercises warm up your muscles and joints and improve the movement and flexibility of your shoulder. These exercises also help to relieve pain, numbness, and tingling. These exercises are done using your healthy shoulder to help stretch the muscles of your injured shoulder. Exercise E: Corner Stretch (External Rotation and Abduction) 1. Stand in a doorway with one of your feet slightly in front of the other. This is called a staggered stance. If you cannot reach your forearms to the door frame, stand facing a corner of a room. 2. Choose one of the following positions as told by your health care provider: ? Place your hands and forearms on the door frame above your head. ? Place your hands and forearms on the door frame at the height of your head. ? Place your hands on the door frame at the height of your elbows. 3. Slowly move your weight onto your front foot until you feel a stretch across your chest and in the front of your shoulders. Keep your head and chest upright and keep your abdominal muscles tight. 4. Hold for __________ seconds. 5. To release the stretch, shift your weight to your back foot. Repeat __________ times. Complete this stretch __________ times a day. Exercise F:Extension, Standing 1. Stand and hold a broomstick, a cane, or a similar object behind your back. ? Your hands should be a little wider than shoulder-width apart. ? Your palms should face away from your back. 2. Keeping your elbows straight and keeping your shoulder muscles relaxed, move the stick away from your body until you feel a stretch in your shoulder. ? Avoid shrugging your shoulders while you move the stick. Keep your shoulder blade tucked down toward the middle of your back. 3. Hold for __________ seconds. 4. Slowly return to the starting position. Repeat __________ times. Complete this exercise __________ times a  day. Strengthening Exercises           These exercises build strength and endurance in your shoulder. Endurance is the ability to use your muscles for a long time, even after they get tired. Exercise G:External Rotation 1. Sit in a stable chair without armrests. 2. Secure an exercise band at elbow height on your left / right side. 3. Place a soft object, such as a folded towel or a small pillow, between your left / right upper arm and your body to move your elbow a few inches away (about 10 cm) from your side. 4. Hold the end of the band so it is tight and there is no slack. 5. Keeping your elbow pressed against the soft object, move your left / right forearm out, away from your abdomen. Keep your body steady so only your forearm moves. 6. Hold for __________ seconds. 7. Slowly return to the starting position. Repeat __________ times. Complete this exercise __________ times a day. Exercise H:Shoulder Abduction 1. Sit in a stable chair without armrests, or stand. 2. Hold a __________ weight in your   left / right hand, or hold an exercise band with both hands. 3. Start with your arms straight down and your left / right palm facing in, toward your body. 4. Slowly lift your left / right hand out to your side. Do not lift your hand above shoulder height unless your health care provider tells you that this is safe. ? Keep your arms straight. ? Avoid shrugging your shoulder while you do this movement. Keep your shoulder blade tucked down toward the middle of your back. 5. Hold for __________ seconds. 6. Slowly lower your arm, and return to the starting position. Repeat __________ times. Complete this exercise __________ times a day. Exercise I:Shoulder Extension 1. Sit in a stable chair without armrests, or stand. 2. Secure an exercise band to a stable object in front of you where it is at shoulder height. 3. Hold one end of the exercise band in each hand. Your palms should face each  other. 4. Straighten your elbows and lift your hands up to shoulder height. 5. Step back, away from the secured end of the exercise band, until the band is tight and there is no slack. 6. Squeeze your shoulder blades together as you pull your hands down to the sides of your thighs. Stop when your hands are straight down by your sides. Do not let your hands go behind your body. 7. Hold for __________ seconds. 8. Slowly return to the starting position. Repeat __________ times. Complete this exercise __________ times a day. Exercise J:Standing Shoulder Row 1. Sit in a stable chair without armrests, or stand. 2. Secure an exercise band to a stable object in front of you so it is at waist height. 3. Hold one end of the exercise band in each hand. Your palms should be in a thumbs-up position. 4. Bend each of your elbows to an "L" shape (about 90 degrees) and keep your upper arms at your sides. 5. Step back until the band is tight and there is no slack. 6. Slowly pull your elbows back behind you. 7. Hold for __________ seconds. 8. Slowly return to the starting position. Repeat __________ times. Complete this exercise __________ times a day. Exercise K:Shoulder Press-Ups 1. Sit in a stable chair that has armrests. Sit upright, with your feet flat on the floor. 2. Put your hands on the armrests so your elbows are bent and your fingers are pointing forward. Your hands should be about even with the sides of your body. 3. Push down on the armrests and use your arms to lift yourself off of the chair. Straighten your elbows and lift yourself up as much as you comfortably can. ? Move your shoulder blades down, and avoid letting your shoulders move up toward your ears. ? Keep your feet on the ground. As you get stronger, your feet should support less of your body weight as you lift yourself up. 4. Hold for __________ seconds. 5. Slowly lower yourself back into the chair. Repeat __________ times. Complete  this exercise __________ times a day. Exercise L: Wall Push-Ups 1. Stand so you are facing a stable wall. Your feet should be about one arm-length away from the wall. 2. Lean forward and place your palms on the wall at shoulder height. 3. Keep your feet flat on the floor as you bend your elbows and lean forward toward the wall. 4. Hold for __________ seconds. 5. Straighten your elbows to push yourself back to the starting position. Repeat __________ times. Complete this exercise __________ times   a day. This information is not intended to replace advice given to you by your health care provider. Make sure you discuss any questions you have with your health care provider. Document Released: 03/03/2005 Document Revised: 08/23/2017 Document Reviewed: 12/29/2014 Elsevier Interactive Patient Education  2019 [employer].  

## 2018-09-11 ENCOUNTER — Encounter: Payer: Self-pay | Admitting: Family Medicine

## 2018-09-15 ENCOUNTER — Other Ambulatory Visit: Payer: Self-pay | Admitting: Family Medicine

## 2018-09-15 ENCOUNTER — Other Ambulatory Visit: Payer: Self-pay | Admitting: *Deleted

## 2018-09-15 MED ORDER — LISINOPRIL-HYDROCHLOROTHIAZIDE 20-25 MG PO TABS: 1.0000 | ORAL_TABLET | Freq: Every day | ORAL | 0 refills | Status: DC

## 2018-09-18 ENCOUNTER — Other Ambulatory Visit: Payer: Self-pay | Admitting: Family Medicine

## 2018-09-18 ENCOUNTER — Encounter: Payer: Self-pay | Admitting: Family Medicine

## 2018-09-18 ENCOUNTER — Telehealth: Payer: Self-pay | Admitting: Family Medicine

## 2018-09-18 DIAGNOSIS — M7581 Other shoulder lesions, right shoulder: Secondary | ICD-10-CM

## 2018-09-18 MED ORDER — HYDROCODONE-ACETAMINOPHEN 5-325 MG PO TABS: 1.0000 | ORAL_TABLET | Freq: Four times a day (QID) | ORAL | 0 refills | Status: AC | PRN

## 2018-09-18 NOTE — Telephone Encounter (Signed)
Orthopedic referral Utilize group that comes from [facility] to [place] please Hydrocodone was sent in as requested caution drowsiness

## 2018-09-18 NOTE — Telephone Encounter (Signed)
Referral placed and pt is aware. Pt aware that medication was sent in.

## 2018-09-18 NOTE — Telephone Encounter (Signed)
Pt contacted. Pt states she would like the ortho referral; referral placed. Pt would like something called in for pain. Pt would like pain med sent to [facility] in [place]. Please advise. Thank you

## 2018-09-18 NOTE — Telephone Encounter (Signed)
So at this point she has some options  Physical therapy is option but tends to be expensive with co-pays for every visit  Use of the sling is not wrong but the problem is most people have it increased possibility of it promoting a frozen shoulder.  Orthopedic evaluation would be a good idea-and they can do injection if necessary to help Therefore I would recommend orthopedic evaluation. If the patient does not have a specific orthopedist we can help get her in relatively quickly with the group from [facility] that comes up to [facility]  If the patient needs pain medication sent him to help in the evening time to let us know Please talk with patient see what she would like to do

## 2018-09-18 NOTE — Telephone Encounter (Signed)
Please advise. Thank you

## 2018-09-18 NOTE — Telephone Encounter (Signed)
Pt states right shoulder is no better, in a lot of pain (seen recently here for possible torn rotator cuff)  Wonders if she should be in a sling?  States meds are not helping  Please advise & call pt     [facility]

## 2018-09-21 ENCOUNTER — Other Ambulatory Visit: Payer: Self-pay

## 2018-09-21 ENCOUNTER — Ambulatory Visit: Payer: Self-pay

## 2018-09-21 ENCOUNTER — Encounter: Payer: Self-pay | Admitting: Orthopaedic Surgery

## 2018-09-21 ENCOUNTER — Ambulatory Visit (INDEPENDENT_AMBULATORY_CARE_PROVIDER_SITE_OTHER): Payer: BLUE CROSS/BLUE SHIELD | Admitting: Orthopaedic Surgery

## 2018-09-21 VITALS — Ht 65.0 in | Wt 230.0 lb

## 2018-09-21 DIAGNOSIS — M542 Cervicalgia: Secondary | ICD-10-CM

## 2018-09-21 DIAGNOSIS — M25511 Pain in right shoulder: Secondary | ICD-10-CM

## 2018-09-21 NOTE — Progress Notes (Signed)
Office Visit Note   Patient: Theresa [name]           Date of Birth: 09-Aug-1970           MRN: [id number] Visit Date: 09/21/2018              Requested by: Kathyrn Drown, MD [address] [place], [place] [address] PCP: Kathyrn Drown, MD   Assessment & Plan: Visit Diagnoses:  1. Neck pain   2. Acute pain of right shoulder     Plan: We will set up occupational therapy treatment for cervical spine and  right shoulder.  Recheck 5 weeks.  Radiographs cervical spine and shoulder were normal.  Hopefully her symptoms will resolve with conservative therapy.  Follow-Up Instructions: Return in about 5 weeks (around 10/26/2018).   Orders:  Orders Placed This Encounter  Procedures  . XR Shoulder Right  . XR Cervical Spine 2 or 3 views   No orders of the defined types were placed in this encounter.     Procedures: No procedures performed   Clinical Data: No additional findings.   Subjective: Chief Complaint  Patient presents with  . Neck - Pain  . Right Shoulder - Pain    HPI 48 year old female is seen with onset of neck and right shoulder pain that started when she moved in a new house about 3 months ago.  Pain is gradually getting worse she has problems with outstretch reaching with the right shoulder as well as reaching behind her and pain that radiates down to her right ring and small finger.  Denies elbow pain.  She has been treated with hydrocodone  Review of Systems positive for history of insomnia, fatigue, hyperglycemia, abdominal pain, bloating, hypertension, anxiety, diverticulosis, obesity BMI 38, prediabetes . Non smoker. otherwise 14 point systems negative is obtains HPI.   Objective: Vital Signs: Ht '5\' 5"'  (1.651 m)   Wt 230 lb (104.3 kg)   BMI 38.27 kg/m   Physical Exam Constitutional:      Appearance: She is well-developed.  HENT:     Head: Normocephalic.     Right Ear: External ear normal.     Left Ear: External ear normal.  Eyes:   Pupils: Pupils are equal, round, and reactive to light.  Neck:     Thyroid: No thyromegaly.     Trachea: No tracheal deviation.  Cardiovascular:     Rate and Rhythm: Normal rate.  Pulmonary:     Effort: Pulmonary effort is normal.  Abdominal:     Palpations: Abdomen is soft.  Skin:    General: Skin is warm and dry.  Neurological:     Mental Status: She is alert and oriented to person, place, and time.  Psychiatric:        Behavior: Behavior normal.     Ortho Exam patient has some discomfort with impingement test on the right shoulder negative on the left.  Some discomfort reaching past posterior axillary line but she is able to do so.  Acromioclavicular joint is mildly tender mild brachial plexus tenderness right greater than left.  Negative Spurling on the right.  Biceps triceps brachial radialis are 2+ and symmetrical ulnar nerve at the elbow is normal.  No interossei weakness right or left.  Ring profundus is intact no triggering of the fingers.  Negative Lhermitte.  Normal lower extremity reflexes without clonus.  Normal heel toe gait.  Specialty Comments:  No specialty comments available.  Imaging: No results found.  PMFS History: Patient Active Problem List   Diagnosis Date Noted  . Prediabetes 11/26/2017  . Migraine without aura and without status migrainosus, not intractable 10/04/2017  . Morbid obesity ([place]) 04/10/2016  . Diverticulosis of large intestine 12/05/2015  . BRCA negative 02/10/2015  . IFG (impaired fasting glucose) 07/05/2014  . Essential hypertension, benign 04/27/2014  . Anxiety 04/27/2014  . Abdominal pain, epigastric 02/07/2014  . Nausea without vomiting 02/07/2014  . Bloating 02/07/2014  . Fatigue 09/18/2012  . Hyperglycemia 09/18/2012  . Dysthymia 07/14/2012  . Insomnia due to mental disorder 07/14/2012  . Lower abdominal pain 04/12/2012  . Headache(784.0) 04/12/2012  . Constipation 04/12/2012   Past Medical History:  Diagnosis Date  .  Anxiety   . Depression   . Fibromyalgia   . GERD (gastroesophageal reflux disease)   . Headache(784.0)   . Heart murmur   . IFG (impaired fasting glucose)   . Obsessive-compulsive disorder   . Osteoarthritis   . Seizures ([place])    last seizure at age 63-stressed induced seizure.    Family History  Problem Relation Age of Onset  . Alcohol abuse Mother   . Depression Mother   . Anxiety disorder Mother   . Paranoid behavior Mother   . Hypertension Mother   . Alcohol abuse Father   . Hypertension Father   . Physical abuse Brother   . Colon cancer Neg Hx   . ADD / ADHD Neg Hx   . Bipolar disorder Neg Hx   . Dementia Neg Hx   . OCD Neg Hx   . Drug abuse Neg Hx   . Schizophrenia Neg Hx   . Seizures Neg Hx   . Sexual abuse Neg Hx     Past Surgical History:  Procedure Laterality Date  . ABDOMINAL HYSTERECTOMY     still has right ovary  . BIOPSY N/A 03/05/2014   Procedure: DUODENAL AND GASTRIC BIOPSY;  Surgeon: Danie Binder, MD;  Location: [facility] ORS;  Service: Endoscopy;  Laterality: N/A;  . bladder sling X 3    . CHOLECYSTECTOMY    . COLONOSCOPY  04/24/2012   FRT:MYTRZNB polyp measuring 5 mm in size was found in the proximal transverse colon; polypectomy was performed with cold forceps Pedunculated polyp measuring 1.1 cm in size was found in the sigmoid colon; polypectomy was performed using snare cautery Moderate diverticulosis was noted in the descending colon and sigmoid colon small internal hemorrhoids. next tcs 04/2017  . ESOPHAGOGASTRODUODENOSCOPY N/A 02/18/2014   Procedure: ESOPHAGOGASTRODUODENOSCOPY (EGD);  Surgeon: Danie Binder, MD;  Location: [facility] ENDO SUITE;  Service: Endoscopy;  Laterality: N/A;  115  . ESOPHAGOGASTRODUODENOSCOPY (EGD) WITH PROPOFOL N/A 03/05/2014   SLF: 1. dyspepsia due to constipation, gastritis, and GERD 2. Multiple Gastric polyps 3. Moderate non-erosive gastritis 4. Diverticulum in the 2nd part of the duodenum  . TUBAL LIGATION     Social History    Occupational History  . Occupation: OFFICE MGR    Employer: [employer]    Comment: Radio producer  Tobacco Use  . Smoking status: Never Smoker  . Smokeless tobacco: Never Used  . Tobacco comment: as a kid  Substance and Sexual Activity  . Alcohol use: Yes    Comment: very rere  . Drug use: No  . Sexual activity: Yes    Birth control/protection: Surgical

## 2018-09-29 ENCOUNTER — Encounter: Payer: Self-pay | Admitting: Family Medicine

## 2018-09-29 NOTE — Telephone Encounter (Signed)
This message was copied and pasted into pt's chart Theresa [name].

## 2018-10-03 ENCOUNTER — Telehealth (HOSPITAL_COMMUNITY): Payer: Self-pay

## 2018-10-03 NOTE — Telephone Encounter (Signed)
Pt called back and states that she wants to close this referral - she will be going to another MD b/c she can not lift her arm at all.

## 2018-10-03 NOTE — Telephone Encounter (Signed)
L/m requested pt call us back by June 17 to schedule, if we do not hear from pt we will close this referral and pt can get a new referral when she is ready to schedule.

## 2018-10-13 ENCOUNTER — Other Ambulatory Visit: Payer: Self-pay

## 2018-10-13 ENCOUNTER — Ambulatory Visit (INDEPENDENT_AMBULATORY_CARE_PROVIDER_SITE_OTHER): Payer: BC Managed Care – PPO | Admitting: Nurse Practitioner

## 2018-10-13 DIAGNOSIS — M25511 Pain in right shoulder: Secondary | ICD-10-CM | POA: Diagnosis not present

## 2018-10-13 DIAGNOSIS — M7581 Other shoulder lesions, right shoulder: Secondary | ICD-10-CM

## 2018-10-13 NOTE — Progress Notes (Signed)
   Subjective:    Patient ID: Theresa [name], female    DOB: April 29, 1971, 48 y.o.   MRN: [id number]  HPI Pt states she is still having right arm pain, numbness, loss of strength. Pt states if she does anything but hang her arm, her arm really hurts. Pt states she has started taking pain pills (and she usually doesn't take pain pills) and Valium. Tightness in neck and pain in shoulder and fingers go numb. Pt is not able to sleep much. Pt states ortho took Probation officer Visit via Video Note  I connected with ODIS WICKEY on 10/13/18 at  9:00 AM EDT by a video enabled telemedicine application and verified that I am speaking with the correct person using two identifiers.  Location: Patient: home Provider: office   I discussed the limitations of evaluation and management by telemedicine and the availability of in person appointments. The patient expressed understanding and agreed to proceed.  History of Present Illness: Presents for complaints of persistent and worsening right shoulder pain.  Initially a phone call was made to Dr. Nicki Reaper on 5/18.  Saw orthopedic specialist Dr. Lorin Mercy on 5/21.  X-rays were done at that time.  He advised physical therapy but patient could not get this done due to COVID and her work schedule.  No specific history of injury.  Moved into a new home.  Denies any repetitive movements of the right arm.  Is unable to do any lifting of any heavy objects with her right arm.  Difficulty putting on her bra.  Difficulty sleeping at night due to pain.  The only position it feels comfortable and is hanging at her side.  Took an occasional hydrocodone from prescription on 518 only for severe pain.  Has tried Aleve, diclofenac and ice/heat.  Pain is always there going from a 2 up to a 10.  Has been crying with the pain at times.   Observations/Objective: Today's visit was via telephone Physical exam was not possible for this visit    Assessment and Plan:   ICD-10-CM   1. Right  rotator cuff tendonitis  M75.81   2. Severe pain of right shoulder  M25.511      Follow Up Instructions: Meds ordered this encounter  Medications  . HYDROcodone-acetaminophen (NORCO/VICODIN) 5-325 MG tablet    Sig: Take 1 tablet by mouth every 4 (four) hours as needed. For severe pain    Dispense:  20 tablet    Refill:  0    Order Specific Question:   Supervising Provider    Answer:   Sallee Lange A [[id number]]   Continue NSAIDs as directed.  Use hydrocodone sparingly only for severe pain.  Patient understands this is a temporary therapy.  Drowsiness precautions.  Will refer to another orthopedic specialist per patient request for further evaluation.  Call back in the meantime if needed.   I discussed the assessment and treatment plan with the patient. The patient was provided an opportunity to ask questions and all were answered. The patient agreed with the plan and demonstrated an understanding of the instructions.   The patient was advised to call back or seek an in-person evaluation if the symptoms worsen or if the condition fails to improve as anticipated.  I provided 15 minutes of non-face-to-face time during this encounter.     Review of Systems     Objective:   Physical Exam        Assessment & Plan:

## 2018-10-14 ENCOUNTER — Encounter: Payer: Self-pay | Admitting: Nurse Practitioner

## 2018-10-14 MED ORDER — HYDROCODONE-ACETAMINOPHEN 5-325 MG PO TABS: 1.0000 | ORAL_TABLET | ORAL | 0 refills | Status: DC | PRN

## 2018-10-18 ENCOUNTER — Encounter: Payer: Self-pay | Admitting: Family Medicine

## 2018-10-18 NOTE — Telephone Encounter (Signed)
Valacyclovir 1 g Take 2 now and then take 2 more 12 hours later This does a good job for a fever blister #4  5 refills

## 2018-10-19 ENCOUNTER — Other Ambulatory Visit: Payer: Self-pay | Admitting: *Deleted

## 2018-10-19 MED ORDER — VALACYCLOVIR HCL 1 G PO TABS: ORAL_TABLET | ORAL | 5 refills | Status: DC

## 2018-10-20 ENCOUNTER — Encounter: Payer: Self-pay | Admitting: Family Medicine

## 2018-10-26 ENCOUNTER — Encounter: Payer: Self-pay | Admitting: Family Medicine

## 2018-11-02 ENCOUNTER — Other Ambulatory Visit: Payer: Self-pay | Admitting: Family Medicine

## 2018-11-02 ENCOUNTER — Telehealth: Payer: Self-pay | Admitting: Family Medicine

## 2018-11-02 MED ORDER — HYDROCODONE-ACETAMINOPHEN 5-325 MG PO TABS: 1.0000 | ORAL_TABLET | ORAL | 0 refills | Status: DC | PRN

## 2018-11-02 NOTE — Telephone Encounter (Signed)
Pt contacted and verbalized understanding.  

## 2018-11-02 NOTE — Telephone Encounter (Signed)
Patient has an appt with Ortho on Monday for her shoulder pain. Twisted it again today and it is sore.  She will run out of pain meds before Monday's appt and was wanting a refill on HYDROcodone-acetaminophen   Last seen 10/13/18  [facility] [place]

## 2018-11-02 NOTE — Telephone Encounter (Signed)
Prescription sent in as requested Further prescription should be through orthopedics until they release her

## 2018-11-02 NOTE — Telephone Encounter (Signed)
Please advise. Thank you

## 2018-11-06 DIAGNOSIS — M7501 Adhesive capsulitis of right shoulder: Secondary | ICD-10-CM | POA: Diagnosis not present

## 2018-11-06 DIAGNOSIS — M25511 Pain in right shoulder: Secondary | ICD-10-CM | POA: Diagnosis not present

## 2018-11-22 ENCOUNTER — Other Ambulatory Visit: Payer: Self-pay | Admitting: Nurse Practitioner

## 2018-12-01 ENCOUNTER — Ambulatory Visit (INDEPENDENT_AMBULATORY_CARE_PROVIDER_SITE_OTHER): Payer: BC Managed Care – PPO | Admitting: Nurse Practitioner

## 2018-12-01 ENCOUNTER — Other Ambulatory Visit: Payer: Self-pay

## 2018-12-01 DIAGNOSIS — J0100 Acute maxillary sinusitis, unspecified: Secondary | ICD-10-CM | POA: Diagnosis not present

## 2018-12-01 MED ORDER — AZITHROMYCIN 250 MG PO TABS: ORAL_TABLET | ORAL | 0 refills | Status: DC

## 2018-12-01 NOTE — Progress Notes (Signed)
   Subjective:  VIRTUAL VISIT  Patient ID: Theresa [name], female    DOB: Sep 28, 1970, 48 y.o.   MRN: [id number]  Sinus Problem This is a new problem. The current episode started in the past 7 days (started Monday). Associated symptoms include congestion, headaches and sinus pressure. (Clearing throat)      Review of Systems  HENT: Positive for congestion and sinus pressure.   Neurological: Positive for headaches.   Virtual Visit via Video Note  I connected with Theresa [name] on 12/01/18 at 10:30 AM EDT by a video enabled telemedicine application and verified that I am speaking with the correct person using two identifiers.  Location: Patient: home Provider: office   I discussed the limitations of evaluation and management by telemedicine and the availability of in person appointments. The patient expressed understanding and agreed to proceed.  History of Present Illness: Presents for complaints of sinus symptoms over the past 5 days.  Began after exposure to a significant amount of dust.  Began with the right side of her nose being blocked.  Pain began to radiate into her teeth, now occurring on both sides.  Denies any dental problems.  Pain along both cheekbones.  No fever.  Some postnasal drainage.  Cough mainly when she lies flat at nighttime.  No relief with OTC meds.   Observations/Objective: NAD. Alert, oriented.  Format  Patient present at home Provider present at office Consent for interaction obtained Coronavirus outbreak made virtual visit necessary    Assessment and Plan: Problem List Items Addressed This Visit    None    Visit Diagnoses    Acute non-recurrent maxillary sinusitis    -  Primary   Relevant Medications   azithromycin (ZITHROMAX Z-PAK) 250 MG tablet     Meds ordered this encounter  Medications  . azithromycin (ZITHROMAX Z-PAK) 250 MG tablet    Sig: Take 2 tablets (500 mg) on  Day 1,  followed by 1 tablet (250 mg) once daily on Days 2 through  5.    Dispense:  6 each    Refill:  0    Diagnosis: acute maxillary sinusitis    Order Specific Question:   Supervising Provider    Answer:   Sallee Lange A [[id number]     Follow Up Instructions: OTC meds as directed for congestion. Start steroid nasal spray as directed.  Call back in 7-10 days if no improvement, sooner if worse.    I discussed the assessment and treatment plan with the patient. The patient was provided an opportunity to ask questions and all were answered. The patient agreed with the plan and demonstrated an understanding of the instructions.   The patient was advised to call back or seek an in-person evaluation if the symptoms worsen or if the condition fails to improve as anticipated.  I provided 15 minutes of non-face-to-face time during this encounter.        Objective:   Physical Exam        Assessment & Plan:

## 2018-12-02 ENCOUNTER — Encounter: Payer: Self-pay | Admitting: Nurse Practitioner

## 2018-12-05 ENCOUNTER — Encounter: Payer: Self-pay | Admitting: Family Medicine

## 2018-12-05 DIAGNOSIS — R635 Abnormal weight gain: Secondary | ICD-10-CM

## 2018-12-05 DIAGNOSIS — R42 Dizziness and giddiness: Secondary | ICD-10-CM

## 2018-12-05 DIAGNOSIS — R5383 Other fatigue: Secondary | ICD-10-CM

## 2018-12-05 DIAGNOSIS — I10 Essential (primary) hypertension: Secondary | ICD-10-CM

## 2018-12-07 ENCOUNTER — Other Ambulatory Visit: Payer: Self-pay | Admitting: Nurse Practitioner

## 2018-12-07 ENCOUNTER — Encounter: Payer: Self-pay | Admitting: Nurse Practitioner

## 2018-12-07 MED ORDER — CLINDAMYCIN HCL 300 MG PO CAPS: 300.0000 mg | ORAL_CAPSULE | Freq: Three times a day (TID) | ORAL | 0 refills | Status: DC

## 2018-12-07 NOTE — Telephone Encounter (Signed)
Nurses I think it would be reasonable for the patient to do some lab work I recommend TSH, ferritin, vitamin D, metabolic 7, liver  Diagnosis other fatigue, vitamin D deficiency, dizziness, weight gain

## 2018-12-08 NOTE — Addendum Note (Signed)
Addended by: Dairl Ponder on: 12/08/2018 02:53 PM   Modules accepted: Orders

## 2018-12-20 DIAGNOSIS — I10 Essential (primary) hypertension: Secondary | ICD-10-CM | POA: Diagnosis not present

## 2018-12-20 DIAGNOSIS — R635 Abnormal weight gain: Secondary | ICD-10-CM | POA: Diagnosis not present

## 2018-12-20 DIAGNOSIS — R5383 Other fatigue: Secondary | ICD-10-CM | POA: Diagnosis not present

## 2018-12-21 LAB — HEPATIC FUNCTION PANEL
ALT: 47 IU/L — ABNORMAL HIGH (ref 0–32)
AST: 28 IU/L (ref 0–40)
Albumin: 4.2 g/dL (ref 3.8–4.8)
Alkaline Phosphatase: 88 IU/L (ref 39–117)
Bilirubin Total: 0.8 mg/dL (ref 0.0–1.2)
Bilirubin, Direct: 0.19 mg/dL (ref 0.00–0.40)
Total Protein: 6.6 g/dL (ref 6.0–8.5)

## 2018-12-21 LAB — VITAMIN D 25 HYDROXY (VIT D DEFICIENCY, FRACTURES): Vit D, 25-Hydroxy: 28.5 ng/mL — ABNORMAL LOW (ref 30.0–100.0)

## 2018-12-21 LAB — TSH: TSH: 1.29 u[IU]/mL (ref 0.450–4.500)

## 2018-12-21 LAB — BASIC METABOLIC PANEL
BUN/Creatinine Ratio: 11 (ref 9–23)
BUN: 8 mg/dL (ref 6–24)
CO2: 24 mmol/L (ref 20–29)
Calcium: 9.3 mg/dL (ref 8.7–10.2)
Chloride: 105 mmol/L (ref 96–106)
Creatinine, Ser: 0.76 mg/dL (ref 0.57–1.00)
GFR calc Af Amer: 108 mL/min/{1.73_m2} (ref >59)
GFR calc non Af Amer: 94 mL/min/{1.73_m2} (ref >59)
Glucose: 112 mg/dL — ABNORMAL HIGH (ref 65–99)
Potassium: 4.4 mmol/L (ref 3.5–5.2)
Sodium: 142 mmol/L (ref 134–144)

## 2018-12-21 LAB — FERRITIN: Ferritin: 93 ng/mL (ref 15–150)

## 2018-12-22 ENCOUNTER — Encounter: Payer: Self-pay | Admitting: Family Medicine

## 2018-12-22 NOTE — Telephone Encounter (Signed)
Please forward to [place]. Thanks.

## 2018-12-29 ENCOUNTER — Encounter: Payer: Self-pay | Admitting: Nurse Practitioner

## 2018-12-29 ENCOUNTER — Ambulatory Visit (INDEPENDENT_AMBULATORY_CARE_PROVIDER_SITE_OTHER): Payer: BC Managed Care – PPO | Admitting: Nurse Practitioner

## 2018-12-29 DIAGNOSIS — N951 Menopausal and female climacteric states: Secondary | ICD-10-CM | POA: Diagnosis not present

## 2018-12-29 DIAGNOSIS — R5383 Other fatigue: Secondary | ICD-10-CM

## 2018-12-29 DIAGNOSIS — R41840 Attention and concentration deficit: Secondary | ICD-10-CM | POA: Diagnosis not present

## 2018-12-29 DIAGNOSIS — R7303 Prediabetes: Secondary | ICD-10-CM

## 2018-12-29 NOTE — Progress Notes (Signed)
VIRTUAL VISIT Subjective:    Patient ID: Theresa [name], female    DOB: Aug 20, 1970, 48 y.o.   MRN: [id number]  HPI Pt is here to discuss labs.  Pt has also been having sinus issues for about a month. (Pt daughter went to [facility])  Virtual Visit via Video Note  I connected with SHAYMA PFEFFERLE on 12/29/18 at  1:40 PM EDT by a video enabled telemedicine application and verified that I am speaking with the correct person using two identifiers.  Location: Patient: home Provider: office   I discussed the limitations of evaluation and management by telemedicine and the availability of in person appointments. The patient expressed understanding and agreed to proceed.  History of Present Illness: Presents to discuss her recent labs. Has noticed some trouble focusing and some short term memory loss specifically names. Has had some anxiety and depression. No previous diagnosis of ADD. Denies symptoms of sleep apnea. Has questions about hormone use. Had a hysterectomy with left oophorectomy about 17 years ago. Had genetic testing for breast cancer due to her family history. States this was negative for BRCA.  Also continues to have pain in the right facial area/maxillary sinus area. Has some jaw popping. Has completed 2 courses of antibiotics including a Zpack and Clindamycin. Pain is better with a hot bath and submerging her face.    Observations/Objective: NAD. Alert, oriented. Calm, cheerful affect. Thoughts logical, coherent and relevant.  Reviewed recent labs with patient. Results for orders placed or performed in visit on 12/05/18  Hepatic function panel  Result Value Ref Range   Total Protein 6.6 6.0 - 8.5 g/dL   Albumin 4.2 3.8 - 4.8 g/dL   Bilirubin Total 0.8 0.0 - 1.2 mg/dL   Bilirubin, Direct 0.19 0.00 - 0.40 mg/dL   Alkaline Phosphatase 88 39 - 117 IU/L   AST 28 0 - 40 IU/L   ALT 47 (H) 0 - 32 IU/L  Basic metabolic panel  Result Value Ref Range   Glucose 112 (H) 65 - 99  mg/dL   BUN 8 6 - 24 mg/dL   Creatinine, Ser 0.76 0.57 - 1.00 mg/dL   GFR calc non Af Amer 94 >59 mL/min/1.73   GFR calc Af Amer 108 >59 mL/min/1.73   BUN/Creatinine Ratio 11 9 - 23   Sodium 142 134 - 144 mmol/L   Potassium 4.4 3.5 - 5.2 mmol/L   Chloride 105 96 - 106 mmol/L   CO2 24 20 - 29 mmol/L   Calcium 9.3 8.7 - 10.2 mg/dL  VITAMIN D 25 Hydroxy (Vit-D Deficiency, Fractures)  Result Value Ref Range   Vit D, 25-Hydroxy 28.5 (L) 30.0 - 100.0 ng/mL  Ferritin  Result Value Ref Range   Ferritin 93 15 - 150 ng/mL  TSH  Result Value Ref Range   TSH 1.290 0.450 - 4.500 uIU/mL   Although some mild abnormalities, nothing in labs to explain her concentration or memory issues.   Assessment and Plan: Problem List Items Addressed This Visit      Other   Fatigue   Prediabetes    Other Visit Diagnoses    Perimenopause    -  Primary   Difficulty concentrating            Follow Up Instructions: Discussed options. Will refer to ENT for evaluation per patient request. She will be mailed a [place] and adult ADD screen. Request that she complete those forms and return to [place].  Call back in the  meantime if needed.    I discussed the assessment and treatment plan with the patient. The patient was provided an opportunity to ask questions and all were answered. The patient agreed with the plan and demonstrated an understanding of the instructions.   The patient was advised to call back or seek an in-person evaluation if the symptoms worsen or if the condition fails to improve as anticipated.  I provided 25 minutes of non-face-to-face time during this encounter.  25 minutes was spent with the patient.  This statement verifies that 25 minutes was indeed spent with the patient.  More than 50% of this visit-total duration of the visit-was spent in counseling and coordination of care. The issues that the patient came in for today as reflected in the diagnosis (s) please refer to  documentation for further details.    Review of Systems     Objective:   Physical Exam        Assessment & Plan:

## 2019-01-07 ENCOUNTER — Encounter: Payer: Self-pay | Admitting: Family Medicine

## 2019-01-11 ENCOUNTER — Encounter: Payer: Self-pay | Admitting: Family Medicine

## 2019-01-12 ENCOUNTER — Telehealth: Payer: Self-pay | Admitting: Nurse Practitioner

## 2019-01-12 ENCOUNTER — Other Ambulatory Visit: Payer: Self-pay | Admitting: Nurse Practitioner

## 2019-01-12 MED ORDER — BUPROPION HCL ER (XL) 150 MG PO TB24: ORAL_TABLET | ORAL | 2 refills | Status: DC

## 2019-01-12 NOTE — Telephone Encounter (Signed)
Hi Carolyn Here's the message I spoke to you about.Please reach out to [facility]. Thanks- Event organiser

## 2019-01-12 NOTE — Telephone Encounter (Signed)
[place] has mailed in the following forms:  GAD 7 Score: 2 PHQ 9 Score: 9; Somewhat difficult See screening for Adult ADD

## 2019-01-18 ENCOUNTER — Other Ambulatory Visit: Payer: Self-pay | Admitting: Family Medicine

## 2019-01-20 ENCOUNTER — Other Ambulatory Visit: Payer: Self-pay | Admitting: Nurse Practitioner

## 2019-01-20 MED ORDER — DIAZEPAM 5 MG PO TABS: ORAL_TABLET | ORAL | 0 refills | Status: DC

## 2019-02-13 ENCOUNTER — Telehealth: Payer: Self-pay | Admitting: Family Medicine

## 2019-02-13 NOTE — Telephone Encounter (Signed)
Please advise. Thank you

## 2019-02-13 NOTE — Telephone Encounter (Signed)
Pt states she's been on buPROPion (WELLBUTRIN XL) 150 MG 24 hr tablet for a couple months and has not noticed symptom relief    Was using for her perimenopause, trouble concentrating, foggy mind  Pt would like to try something else  Please advise & call pt (pt is aware that Hoyle Sauer is only here on Fridays)      [place]

## 2019-02-17 ENCOUNTER — Encounter: Payer: Self-pay | Admitting: Nurse Practitioner

## 2019-02-17 NOTE — Telephone Encounter (Signed)
Mychart message sent to patient to discuss options.

## 2019-02-27 DIAGNOSIS — I10 Essential (primary) hypertension: Secondary | ICD-10-CM | POA: Insufficient documentation

## 2019-02-27 DIAGNOSIS — M5412 Radiculopathy, cervical region: Secondary | ICD-10-CM | POA: Diagnosis not present

## 2019-02-27 DIAGNOSIS — M542 Cervicalgia: Secondary | ICD-10-CM | POA: Diagnosis not present

## 2019-02-27 DIAGNOSIS — M25511 Pain in right shoulder: Secondary | ICD-10-CM | POA: Diagnosis not present

## 2019-03-08 ENCOUNTER — Telehealth: Payer: Self-pay | Admitting: Family Medicine

## 2019-03-08 NOTE — Telephone Encounter (Signed)
Patient is requesting refill lisinopril 20 /25 mg called into [facility].Last visit was 12/01/18 for sinus. Please advise

## 2019-03-09 ENCOUNTER — Other Ambulatory Visit: Payer: Self-pay | Admitting: Nurse Practitioner

## 2019-03-09 MED ORDER — LISINOPRIL-HYDROCHLOROTHIAZIDE 20-25 MG PO TABS: 1.0000 | ORAL_TABLET | Freq: Every day | ORAL | 0 refills | Status: DC

## 2019-03-09 NOTE — Telephone Encounter (Signed)
Done

## 2019-03-15 ENCOUNTER — Encounter: Payer: Self-pay | Admitting: Nurse Practitioner

## 2019-03-17 ENCOUNTER — Other Ambulatory Visit: Payer: Self-pay | Admitting: Nurse Practitioner

## 2019-03-17 MED ORDER — ESTRADIOL 0.5 MG PO TABS: 0.5000 mg | ORAL_TABLET | Freq: Every day | ORAL | 2 refills | Status: DC

## 2019-03-19 ENCOUNTER — Other Ambulatory Visit: Payer: Self-pay

## 2019-03-19 ENCOUNTER — Ambulatory Visit (INDEPENDENT_AMBULATORY_CARE_PROVIDER_SITE_OTHER): Payer: BC Managed Care – PPO | Admitting: Family Medicine

## 2019-03-19 DIAGNOSIS — N3000 Acute cystitis without hematuria: Secondary | ICD-10-CM | POA: Diagnosis not present

## 2019-03-19 MED ORDER — RIZATRIPTAN BENZOATE 10 MG PO TBDP: ORAL_TABLET | ORAL | 6 refills | Status: DC

## 2019-03-19 MED ORDER — CEPHALEXIN 500 MG PO CAPS: 500.0000 mg | ORAL_CAPSULE | Freq: Four times a day (QID) | ORAL | 0 refills | Status: DC

## 2019-03-19 NOTE — Progress Notes (Signed)
   Subjective:    Patient ID: Theresa [name], female    DOB: 11/12/1970, 48 y.o.   MRN: [id number]  Urinary Tract Infection  This is a new problem. Episode onset: 4 days. Associated symptoms include frequency. Associated symptoms comments: Burning with urination and odor to urine.  Urinary symptoms burning with urination denies high fever chills sweats rate needs refill on maxalt.  Refills given  Virtual Visit via Telephone Note  I connected with RAYVN STARK on 03/19/19 at 11:00 AM EST by telephone and verified that I am speaking with the correct person using two identifiers.  Location: Patient: home Provider: office   I discussed the limitations, risks, security and privacy concerns of performing an evaluation and management service by telephone and the availability of in person appointments. I also discussed with the patient that there may be a patient responsible charge related to this service. The patient expressed understanding and agreed to proceed.   History of Present Illness:    Observations/Objective:   Assessment and Plan:   Follow Up Instructions:    I discussed the assessment and treatment plan with the patient. The patient was provided an opportunity to ask questions and all were answered. The patient agreed with the plan and demonstrated an understanding of the instructions.   The patient was advised to call back or seek an in-person evaluation if the symptoms worsen or if the condition fails to improve as anticipated.  I provided 15 minutes of non-face-to-face time during this encounter.      Review of Systems  Constitutional: Negative for activity change and fever.  HENT: Negative for congestion, ear pain and rhinorrhea.   Eyes: Negative for discharge.  Respiratory: Negative for cough, shortness of breath and wheezing.   Cardiovascular: Negative for chest pain.  Genitourinary: Positive for dysuria and frequency.       Objective:   Physical Exam   Today's visit was via telephone Physical exam was not possible for this visit       Assessment & Plan:  Probable UTI go ahead with Keflex 4 times daily for the next 5 days follow-up if progressive troubles or worse warning signs were discussed in detail

## 2019-03-28 ENCOUNTER — Ambulatory Visit (INDEPENDENT_AMBULATORY_CARE_PROVIDER_SITE_OTHER): Payer: BC Managed Care – PPO

## 2019-03-28 ENCOUNTER — Other Ambulatory Visit: Payer: Self-pay

## 2019-03-28 ENCOUNTER — Ambulatory Visit
Admission: EM | Admit: 2019-03-28 | Discharge: 2019-03-28 | Disposition: A | Discharge status: Home / Self Care | Payer: BC Managed Care – PPO | Attending: Emergency Medicine | Admitting: Emergency Medicine

## 2019-03-28 DIAGNOSIS — M79642 Pain in left hand: Secondary | ICD-10-CM | POA: Diagnosis not present

## 2019-03-28 DIAGNOSIS — S59901A Unspecified injury of right elbow, initial encounter: Secondary | ICD-10-CM | POA: Diagnosis not present

## 2019-03-28 DIAGNOSIS — S6992XA Unspecified injury of left wrist, hand and finger(s), initial encounter: Secondary | ICD-10-CM

## 2019-03-28 DIAGNOSIS — W11XXXA Fall on and from ladder, initial encounter: Secondary | ICD-10-CM

## 2019-03-28 DIAGNOSIS — S4991XA Unspecified injury of right shoulder and upper arm, initial encounter: Secondary | ICD-10-CM | POA: Diagnosis not present

## 2019-03-28 DIAGNOSIS — M25521 Pain in right elbow: Secondary | ICD-10-CM

## 2019-03-28 DIAGNOSIS — M25511 Pain in right shoulder: Secondary | ICD-10-CM

## 2019-03-28 DIAGNOSIS — M62838 Other muscle spasm: Secondary | ICD-10-CM

## 2019-03-28 DIAGNOSIS — W19XXXA Unspecified fall, initial encounter: Secondary | ICD-10-CM

## 2019-03-28 MED ORDER — NAPROXEN 500 MG PO TABS: 500.0000 mg | ORAL_TABLET | Freq: Two times a day (BID) | ORAL | 0 refills | Status: DC

## 2019-03-28 MED ORDER — CYCLOBENZAPRINE HCL 10 MG PO TABS: 10.0000 mg | ORAL_TABLET | Freq: Every day | ORAL | 0 refills | Status: DC

## 2019-03-28 NOTE — ED Provider Notes (Signed)
[facility]   [id number] 03/28/19 Arrival Time: [id number]  CC: Fall; multiple joint complaint  SUBJECTIVE: History from: patient. Theresa [name] is a 48 y.o. female complains of multiple joint pain/ injury after fall from 10 ft.  Symptoms began after ladder broke while walking down from the attic.  Was able to catch herself on the rafters with RT UE, then dropped down landing on feet.  States the distance from her feet to the floor was approximately 10 ft.  Fire dept was called.  They recommended further evaluation in person.  Pain diffuse about the RT UE, and LT hand.  Also mentions hips "aching."  Describes the pain as intermittent and achy in character.  RT UE pain 8/10.  Has NOT tried OTC medications. Symptoms are made worse with ROM about the RT UE.  Denies similar symptoms in the past.  Complains of associated swelling, and ecchymosis, as well as scratches.  Denies fever, chills, erythema, CP, SOB, abdominal pain, weakness, numbness and tingling, loss of bowel or bladder function.    ROS: As per HPI.  All other pertinent ROS negative.     Past Medical History:  Diagnosis Date  . Anxiety   . Depression   . Fibromyalgia   . GERD (gastroesophageal reflux disease)   . Headache(784.0)   . Heart murmur   . IFG (impaired fasting glucose)   . Obsessive-compulsive disorder   . Osteoarthritis   . Seizures ([place])    last seizure at age 36-stressed induced seizure.   Past Surgical History:  Procedure Laterality Date  . ABDOMINAL HYSTERECTOMY     still has right ovary  . BIOPSY N/A 03/05/2014   Procedure: DUODENAL AND GASTRIC BIOPSY;  Surgeon: Danie Binder, MD;  Location: [facility];  Service: Endoscopy;  Laterality: N/A;  . bladder sling X 3    . CHOLECYSTECTOMY    . COLONOSCOPY  04/24/2012   [id number] polyp measuring 5 mm in size was found in the proximal transverse colon; polypectomy was performed with cold forceps Pedunculated polyp measuring 1.1 cm in size was found in the  sigmoid colon; polypectomy was performed using snare cautery Moderate diverticulosis was noted in the descending colon and sigmoid colon small internal hemorrhoids. next tcs 04/2017  . ESOPHAGOGASTRODUODENOSCOPY N/A 02/18/2014   Procedure: ESOPHAGOGASTRODUODENOSCOPY (EGD);  Surgeon: Danie Binder, MD;  Location: [facility] END[facility] SUITE;  Service: Endoscopy;  Laterality: N/A;  115  . ESOPHAGOGASTRODUODENOSCOPY (EGD) WITH PROPOFOL N/A 03/05/2014   SLF: 1. dyspepsia due to constipation, gastritis, and GERD 2. Multiple Gastric polyps 3. Moderate non-erosive gastritis 4. Diverticulum in the 2nd part of the duodenum  . TUBAL LIGATION     No Known Allergies No current facility-administered medications on file prior to encounter.    Current Outpatient Medications on File Prior to Encounter  Medication Sig Dispense Refill  . cephALEXin (KEFLEX) 500 MG capsule Take 1 capsule (500 mg total) by mouth 4 (four) times daily. 20 capsule 0  . diazepam (VALIUM) 5 MG tablet Take 1/2 -1 tablet po daily prn anxiety 30 tablet 0  . estradiol (ESTRACE) 0.5 MG tablet Take 1 tablet (0.5 mg total) by mouth daily. 30 tablet 2  . lisinopril-hydrochlorothiazide (ZESTORETIC) 20-25 MG tablet Take 1 tablet by mouth daily. 90 tablet 0  . Magnesium 500 MG TABS Take 1 tablet by mouth daily. Reported on 07/25/2015    . Multiple Vitamin (MULTIVITAMIN) tablet Take 1 tablet by mouth daily. Reported on 07/25/2015    . rizatriptan (MAXALT-MLT)  10 MG disintegrating tablet TAKE 1 TABLET AS NEEDED FOR MIGRAIN, MAY REPEAT IN 2 HOURS MAX 2/24 HOURS 10 tablet 6  . [DISCONTINUED] buPROPion (WELLBUTRIN XL) 150 MG 24 hr tablet Take one tab qam (Patient not taking: Reported on 03/19/2019) 30 tablet 2   Social History   Socioeconomic History  . Marital status: Single    Spouse name: Not on file  . Number of children: 2  . Years of education: Not on file  . Highest education level: Not on file  Occupational History  . Occupation: OFFICE MGR     Employer: [employer]    Comment: Radio producer  Social Needs  . Financial resource strain: Not on file  . Food insecurity    Worry: Not on file    Inability: Not on file  . Transportation needs    Medical: Not on file    Non-medical: Not on file  Tobacco Use  . Smoking status: Never Smoker  . Smokeless tobacco: Never Used  . Tobacco comment: as a kid  Substance and Sexual Activity  . Alcohol use: Yes    Comment: very rere  . Drug use: No  . Sexual activity: Yes    Birth control/protection: Surgical  Lifestyle  . Physical activity    Days per week: Not on file    Minutes per session: Not on file  . Stress: Not on file  Relationships  . Social Herbalist on phone: Not on file    Gets together: Not on file    Attends religious service: Not on file    Active member of club or organization: Not on file    Attends meetings of clubs or organizations: Not on file    Relationship status: Not on file  . Intimate partner violence    Fear of current or ex partner: Not on file    Emotionally abused: Not on file    Physically abused: Not on file    Forced sexual activity: Not on file  Other Topics Concern  . Not on file  Social History Narrative   Separated from her husband; has 2 children   Family History  Problem Relation Age of Onset  . Alcohol abuse Mother   . Depression Mother   . Anxiety disorder Mother   . Paranoid behavior Mother   . Hypertension Mother   . Alcohol abuse Father   . Hypertension Father   . Physical abuse Brother   . Colon cancer Neg Hx   . ADD / ADHD Neg Hx   . Bipolar disorder Neg Hx   . Dementia Neg Hx   . OCD Neg Hx   . Drug abuse Neg Hx   . Schizophrenia Neg Hx   . Seizures Neg Hx   . Sexual abuse Neg Hx     OBJECTIVE:  Vitals:   03/28/19 1042  BP: 115/82  Pulse: 71  Resp: 20  Temp: 98.2 F (36.8 C)  SpO2: 97%    General appearance: ALERT; in no acute distress.  Head: NCAT Eyes: EOMI grossly;  PERRL Lungs: Normal respiratory effort; CTAB CV: RRR Musculoskeletal: RT upper extremity; LT hand; spine; hips  Inspection: RT upper arm with swelling and ecchymosis to posterior distal upper arm; LT hand with ecchymosis over palmar aspect of 2-3 MCP joints; multiple superficial scratches to bilateral UEs Palpation: TTP over bilateral trapezius, with possible spasm; diffusely TTP over RT distal clavicle, RT anterior shoulder, RT distal humerus, RT medial  elbow; TTP over LEFT hand second MCP joints; NTTP over c-spine/ t-spine/ l-spine; NTTP over bilateral hips ROM: LROM about the RT shoulder, RT elbow; FROM about the c/t/l- spine and bilateral hips Strength: RT 4+/5 elbow flexion, 5/5 elbow extension, 5/5 grip strength, 5/5 hip flexion, 5/5 knee abduction, 5/5 knee adduction Skin: warm and dry Neurologic: Ambulates without difficulty; Sensation intact about the upper/ lower extremities Psychological: alert and cooperative; normal mood and affect  DIAGNOSTIC STUDIES:  Dg Shoulder Right  Result Date: 03/28/2019 CLINICAL DATA:  48 year old female status post fall from at ext airs last night. Pain. EXAM: RIGHT SHOULDER - 2+ VIEW COMPARISON:  None. FINDINGS: Bone mineralization is within normal limits. There is no evidence of fracture or dislocation. There is no evidence of arthropathy or other focal bone abnormality. Negative visible right chest and ribs. IMPRESSION: Negative. Electronically Signed   By: Genevie Ann M.D.   On: 03/28/2019 11:32   Dg Elbow Complete Right  Result Date: 03/28/2019 CLINICAL DATA:  48 year old female status post fall from at ext airs last night. Pain. EXAM: RIGHT ELBOW - COMPLETE 3+ VIEW COMPARISON:  None. FINDINGS: Bone mineralization is within normal limits. There is no evidence of fracture, dislocation, or joint effusion. There is no evidence of arthropathy or other focal bone abnormality. No discrete soft tissue injury. IMPRESSION: Negative. Electronically Signed   By: Genevie Ann M.D.   On: 03/28/2019 11:33   Dg Hand Complete Left  Result Date: 03/28/2019 CLINICAL DATA:  48 year old female status post fall from at ext airs last night. Pain. EXAM: LEFT HAND - COMPLETE 3+ VIEW COMPARISON:  None. FINDINGS: Bone mineralization is within normal limits. There is no evidence of fracture or dislocation. Preserved joint spaces. Small degenerative appearing ossific fragment along the volar aspect of the 4th PIP on the lateral view. No discrete soft tissue injury. IMPRESSION: No acute fracture or dislocation identified about the left hand. Electronically Signed   By: Genevie Ann M.D.   On: 03/28/2019 11:35    X-rays negative for bony abnormalities including fracture, or dislocation.  No soft tissue swelling.    I have reviewed the x-rays myself and the radiologist interpretation. I am in agreement with the radiologist interpretation.    ASSESSMENT & PLAN:  1. Fall, initial encounter   2. Injury of right shoulder, initial encounter   3. Elbow injury, right, initial encounter   4. Injury of left hand, initial encounter   5. Muscle spasm     Meds ordered this encounter  Medications  . naproxen (NAPROSYN) 500 MG tablet    Sig: Take 1 tablet (500 mg total) by mouth 2 (two) times daily.    Dispense:  30 tablet    Refill:  0    Order Specific Question:   Supervising Provider    Answer:   Raylene Everts [id number]  . cyclobenzaprine (FLEXERIL) 10 MG tablet    Sig: Take 1 tablet (10 mg total) by mouth at bedtime.    Dispense:  15 tablet    Refill:  0    Order Specific Question:   Supervising Provider    Answer:   Raylene Everts [id number]   Offered further evaluation in the ED for multiple joint injuries following 10 ft fall.  Patient declines at this time.  Discussed patient case with Dr. Lanny Cramp X-rays negative for fracture or dislocation Continue conservative management of rest, ice, elevation, and gentle stretches Take naproxen as needed for pain relief (may  cause abdominal  discomfort, ulcers, and GI bleeds avoid taking with other NSAIDs) Take cyclobenzaprine at nighttime for symptomatic relief. Avoid driving or operating heavy machinery while using medication. Follow up with PCP if symptoms persist Return or go to the ER if you have any new or worsening symptoms (fever, chills, chest pain, shortness of breath, abdominal pain, changes in bowel or bladder habits, numbness/ tingling, neck/back pain, etc...)   Reviewed expectations re: course of current medical issues. Questions answered. Outlined signs and symptoms indicating need for more acute intervention. Patient verbalized understanding. After Visit Summary given.    Lestine Box, PA-C 03/28/19 1221

## 2019-03-28 NOTE — ED Triage Notes (Signed)
Pt was coming down from attic access when ladder broke. Pt caught self on rafters but then dropped to floor. Pt states she landed on feet but has bruising to right elbow and left arm. Pt is having hip pain as well

## 2019-03-28 NOTE — Discharge Instructions (Signed)
Offered further evaluation in the ED for multiple joint injuries following 10 ft fall.  Patient declines at this time.  Discussed patient case with Dr. Lanny Cramp X-rays negative for fracture or dislocation Continue conservative management of rest, ice, elevation, and gentle stretches Take naproxen as needed for pain relief (may cause abdominal discomfort, ulcers, and GI bleeds avoid taking with other NSAIDs) Take cyclobenzaprine at nighttime for symptomatic relief. Avoid driving or operating heavy machinery while using medication. Follow up with PCP if symptoms persist Return or go to the ER if you have any new or worsening symptoms (fever, chills, chest pain, shortness of breath, abdominal pain, changes in bowel or bladder habits, numbness/ tingling, neck/back pain, etc...)

## 2019-04-19 ENCOUNTER — Encounter: Payer: Self-pay | Admitting: Nurse Practitioner

## 2019-04-20 ENCOUNTER — Other Ambulatory Visit: Payer: Self-pay | Admitting: Nurse Practitioner

## 2019-04-20 MED ORDER — DIAZEPAM 5 MG PO TABS: ORAL_TABLET | ORAL | 0 refills | Status: DC

## 2019-04-30 ENCOUNTER — Encounter: Payer: Self-pay | Admitting: Nurse Practitioner

## 2019-05-14 DIAGNOSIS — H5203 Hypermetropia, bilateral: Secondary | ICD-10-CM | POA: Diagnosis not present

## 2019-05-14 DIAGNOSIS — H524 Presbyopia: Secondary | ICD-10-CM | POA: Diagnosis not present

## 2019-05-18 ENCOUNTER — Ambulatory Visit (INDEPENDENT_AMBULATORY_CARE_PROVIDER_SITE_OTHER): Payer: BC Managed Care – PPO | Admitting: Nurse Practitioner

## 2019-05-18 ENCOUNTER — Other Ambulatory Visit: Payer: Self-pay

## 2019-05-18 VITALS — BP 130/82 | Temp 97.3°F | Wt 234.6 lb

## 2019-05-18 DIAGNOSIS — R5383 Other fatigue: Secondary | ICD-10-CM

## 2019-05-18 DIAGNOSIS — K219 Gastro-esophageal reflux disease without esophagitis: Secondary | ICD-10-CM

## 2019-05-18 DIAGNOSIS — I10 Essential (primary) hypertension: Secondary | ICD-10-CM | POA: Diagnosis not present

## 2019-05-18 DIAGNOSIS — R8290 Unspecified abnormal findings in urine: Secondary | ICD-10-CM | POA: Diagnosis not present

## 2019-05-18 DIAGNOSIS — F419 Anxiety disorder, unspecified: Secondary | ICD-10-CM

## 2019-05-18 DIAGNOSIS — R7301 Impaired fasting glucose: Secondary | ICD-10-CM | POA: Diagnosis not present

## 2019-05-18 DIAGNOSIS — M2550 Pain in unspecified joint: Secondary | ICD-10-CM

## 2019-05-18 LAB — POCT URINALYSIS DIPSTICK
Spec Grav, UA: 1.015 (ref 1.010–1.025)
pH, UA: 6 (ref 5.0–8.0)

## 2019-05-18 NOTE — Progress Notes (Signed)
Subjective:    Patient ID: Theresa [name], female    DOB: 30-Jun-1970, 49 y.o.   MRN: [id number]  HPI  Patient arrives to discuss fatigue and complete loss of energy. Patient states it takes her hours to get ready moving in the morning. Patient also having full body aches and dizziness with movement. Migratory joint pain. Patient also still having intermittent chest pains(was referred to cardiology for this) and her urine has a bad odor. Patient thinks she needs more blood work to see what is wrong with her. Has noticed palpitations especially when upset. Occurs at least a couple of times per week, more frequent than usual. Can "hear heartbeat in her ears" when this occurs. Occasional left mid localized chest discomfort at times. Has gotten various numbers through her electronic watch and her BP machine. Lightheaded at times. Lost her balance and fell in early December. No syncopal episodes. No visual changes. Had a recent eye exam which was normal. BP at home ranging from 109-151/81-99 per her records. Has lost 23 lbs. Has been evaluated by both GI and cardiology. Last seen by cardiology on 05/26/18 see note. Did not get exercise tolerance test at that time.  Has lost 23 lbs. GERD is better. Takes Dexilant prn which helps. Has trouble staying asleep. Has significant stress. Using her valium more over the past 2 weeks. Valium and deep breathing calms her down and decreases her heart rate.  C/o urinary odor. No urgency, frequency or dysuria. Is not sexually active at this time.  SH: hysterectomy. Minimal alcohol intake. No tobacco use.  Last seen by mental health for anxiety in 2016.  Denies suicidal or homicidal thoughts or ideation.    Review of Systems     Objective:   Physical Exam NAD. Alert, oriented. Mildly anxious affect. Thoughts logical, coherent and relevant. Dressed appropriately. Making good eye contact. Lungs clear. Heart RRR. No murmur or gallop. Carotids: no bruits or thrills.  Abdomen soft, non distended with moderate upper epigastric tenderness to palpation. No flank or CVA tenderness.  Orthostatics normal.  Results for orders placed or performed in visit on 05/18/19  POCT Urine Dipstick  Result Value Ref Range   Color, UA     Clarity, UA     Glucose, UA     Bilirubin, UA     Ketones, UA     Spec Grav, UA 1.015 1.010 - 1.025   Blood, UA     pH, UA 6.0 5.0 - 8.0   Protein, UA     Urobilinogen, UA     Nitrite, UA     Leukocytes, UA     Appearance     Odor     Urine micro neg.       Assessment & Plan:   Problem List Items Addressed This Visit      Cardiovascular and Mediastinum   Essential hypertension, benign   Relevant Orders   CBC with Diff (Completed)   Hepatic function panel (Completed)     Digestive   Gastroesophageal reflux disease without esophagitis     Endocrine   IFG (impaired fasting glucose) - Primary   Relevant Orders   Hepatic function panel (Completed)   Hemoglobin A1c (Completed)     Other   Anxiety   Relevant Medications   sertraline (ZOLOFT) 50 MG tablet   Fatigue   Relevant Orders   CBC with Diff (Completed)   Hepatic function panel (Completed)    Other Visit Diagnoses    Abnormal  urine odor       Relevant Orders   POCT Urine Dipstick (Completed)   Multiple joint pain       Relevant Orders   CBC with Diff (Completed)   Hepatic function panel (Completed)   Rheumatoid Factor (Completed)   ANA (Completed)     Meds ordered this encounter  Medications  . sertraline (ZOLOFT) 50 MG tablet    Sig: Take 1/2 tab po qd x 6 days then one po qd    Dispense:  30 tablet    Refill:  0    Order Specific Question:   Supervising Provider    Answer:   Sallee Lange A [[id number]]   1. Patient to contact cardiology to schedule exercise tolerance test. Call back in the meantime if worse or new symptoms. 2. Take Dexilant daily. Plan referral back to GI Dr. Oneida Alar if persists at next visit.  3. Patient agrees to medication to  help anxiety. Trial of Zoloft. Plan to slowly titrate. DC med and contact office if any major side effects.  4. Labs pending. 5. Continue deep breathing and stress reduction.  6. Return in about 1 month (around 06/18/2019). Call back sooner if needed.  25 minutes was spent with the patient.  This statement verifies that 25 minutes was indeed spent with the patient.  More than 50% of this visit-total duration of the visit-was spent in counseling and coordination of care. The issues that the patient came in for today as reflected in the diagnosis (s) please refer to documentation for further details.

## 2019-05-19 ENCOUNTER — Encounter: Payer: Self-pay | Admitting: Nurse Practitioner

## 2019-05-21 ENCOUNTER — Encounter: Payer: Self-pay | Admitting: Nurse Practitioner

## 2019-05-21 DIAGNOSIS — K219 Gastro-esophageal reflux disease without esophagitis: Secondary | ICD-10-CM | POA: Insufficient documentation

## 2019-05-21 MED ORDER — SERTRALINE HCL 50 MG PO TABS: ORAL_TABLET | ORAL | 0 refills | Status: DC

## 2019-05-22 ENCOUNTER — Other Ambulatory Visit: Payer: Self-pay | Admitting: Nurse Practitioner

## 2019-05-22 LAB — CBC WITH DIFFERENTIAL/PLATELET
Basophils Absolute: 0.1 10*3/uL (ref 0.0–0.2)
Basos: 1 %
EOS (ABSOLUTE): 0.2 10*3/uL (ref 0.0–0.4)
Eos: 2 %
Hematocrit: 38.7 % (ref 34.0–46.6)
Hemoglobin: 12.9 g/dL (ref 11.1–15.9)
Immature Grans (Abs): 0.1 10*3/uL (ref 0.0–0.1)
Immature Granulocytes: 1 %
Lymphocytes Absolute: 1.8 10*3/uL (ref 0.7–3.1)
Lymphs: 24 %
MCH: 30.8 pg (ref 26.6–33.0)
MCHC: 33.3 g/dL (ref 31.5–35.7)
MCV: 92 fL (ref 79–97)
Monocytes Absolute: 0.7 10*3/uL (ref 0.1–0.9)
Monocytes: 9 %
Neutrophils Absolute: 4.7 10*3/uL (ref 1.4–7.0)
Neutrophils: 63 %
Platelets: 275 10*3/uL (ref 150–450)
RBC: 4.19 x10E6/uL (ref 3.77–5.28)
RDW: 12.1 % (ref 11.7–15.4)
WBC: 7.5 10*3/uL (ref 3.4–10.8)

## 2019-05-22 LAB — HEPATIC FUNCTION PANEL
ALT: 26 IU/L (ref 0–32)
AST: 24 IU/L (ref 0–40)
Albumin: 4.2 g/dL (ref 3.8–4.8)
Alkaline Phosphatase: 84 IU/L (ref 39–117)
Bilirubin Total: 0.5 mg/dL (ref 0.0–1.2)
Bilirubin, Direct: 0.13 mg/dL (ref 0.00–0.40)
Total Protein: 6.9 g/dL (ref 6.0–8.5)

## 2019-05-22 LAB — ANA: ANA Titer 1: NEGATIVE

## 2019-05-22 LAB — HEMOGLOBIN A1C
Est. average glucose Bld gHb Est-mCnc: 120 mg/dL
Hgb A1c MFr Bld: 5.8 % — ABNORMAL HIGH (ref 4.8–5.6)

## 2019-05-22 LAB — RHEUMATOID FACTOR: Rheumatoid fact SerPl-aCnc: <10 IU/mL (ref 0.0–13.9)

## 2019-05-23 ENCOUNTER — Other Ambulatory Visit: Payer: Self-pay | Admitting: Family Medicine

## 2019-05-23 ENCOUNTER — Other Ambulatory Visit: Payer: Self-pay | Admitting: Nurse Practitioner

## 2019-05-24 ENCOUNTER — Other Ambulatory Visit: Payer: Self-pay | Admitting: Nurse Practitioner

## 2019-05-24 MED ORDER — DEXLANSOPRAZOLE 60 MG PO CPDR: 60.0000 mg | DELAYED_RELEASE_CAPSULE | Freq: Every day | ORAL | 2 refills | Status: DC

## 2019-06-01 ENCOUNTER — Telehealth: Payer: Self-pay | Admitting: Family Medicine

## 2019-06-01 NOTE — Telephone Encounter (Signed)
PA attempted through Cover My Meds and approved. Left message to return call

## 2019-06-01 NOTE — Telephone Encounter (Signed)
Pt is checking on PA on dexlansoprazole (DEXILANT) 60 MG capsule

## 2019-06-01 NOTE — Telephone Encounter (Signed)
Pt returned call and verbalized understanding. [address] and they are aware of approval

## 2019-06-20 ENCOUNTER — Encounter: Payer: Self-pay | Admitting: Nurse Practitioner

## 2019-07-11 ENCOUNTER — Other Ambulatory Visit: Payer: Self-pay

## 2019-07-11 ENCOUNTER — Encounter: Payer: Self-pay | Admitting: Adult Health

## 2019-07-11 ENCOUNTER — Ambulatory Visit: Payer: BC Managed Care – PPO | Admitting: Adult Health

## 2019-07-11 VITALS — BP 126/85 | HR 95 | Ht 65.0 in | Wt 223.2 lb

## 2019-07-11 DIAGNOSIS — N816 Rectocele: Secondary | ICD-10-CM

## 2019-07-11 DIAGNOSIS — R52 Pain, unspecified: Secondary | ICD-10-CM | POA: Insufficient documentation

## 2019-07-11 DIAGNOSIS — N898 Other specified noninflammatory disorders of vagina: Secondary | ICD-10-CM | POA: Insufficient documentation

## 2019-07-11 DIAGNOSIS — G479 Sleep disorder, unspecified: Secondary | ICD-10-CM | POA: Diagnosis not present

## 2019-07-11 DIAGNOSIS — L659 Nonscarring hair loss, unspecified: Secondary | ICD-10-CM | POA: Diagnosis not present

## 2019-07-11 DIAGNOSIS — R4189 Other symptoms and signs involving cognitive functions and awareness: Secondary | ICD-10-CM

## 2019-07-11 DIAGNOSIS — F488 Other specified nonpsychotic mental disorders: Secondary | ICD-10-CM

## 2019-07-11 NOTE — Progress Notes (Signed)
Patient ID: [name], female   DOB: 1970-10-02, 49 y.o.   MRN: [id number] History of Present Illness: Theresa [name] is a 49 year old white female,divorced, sp hysterectomy on with multiple complaints, see ROS.She had labs with [facility] Sauer in December and were normal.  PCP is Dr Sallee Lange.   Current Medications, Allergies, Past Medical History, Past Surgical History, Family History and Social History were reviewed in [employer] record.     Review of Systems: Vaginal discharge, had sex recently after a year and a half Goes to sleep but wakes up between 1-3 am  Losing hair Can't concentrate  +body aches  No hot flashes now had after hysterectomy Heart races at times  Has lost 22 lbs in [place] and is working [employer]     Physical Exam:BP 126/85 (BP Location: Left Arm, Patient Position: Sitting, Cuff Size: Normal)   Pulse 95   Ht 5\' 5"  (1.651 m)   Wt 223 lb 3.2 oz (101.2 kg)   BMI 37.14 kg/m  General:  Well developed, well nourished, no acute distress Skin:  Warm and dry Neck:  Midline trachea, normal thyroid, good ROM, no lymphadenopathy Lungs; Clear to auscultation bilaterally Cardiovascular: Regular rate and rhythm I did not pull any hair out with raking my fingers through it  Pelvic:  External genitalia is normal in appearance, has pale mole not raised left groin area.  The vagina is normal in appearance,scant white discharge with out odor, nuswab obtained. Urethra has no lesions or masses. The cervix and uterus is absent. No adnexal masses or tenderness noted.Bladder is non tender, no masses felt. Rectal: Good sphincter tone, no polyps, or hemorrhoids felt.  +low rectocele Extremities/musculoskeletal:  No swelling or varicosities noted, no clubbing or cyanosis Psych:  No mood changes, alert and cooperative,seems happy Fall risk is low PHQ 2 score is 0 Examination chaperoned by Rolena Infante LPN  Impression and Plan:   1. Sleep disturbance When wakes up  go to couch, do not use electronics  Will check [place], I suspect it is menopause, will add estrogen after results back   2. Hair loss May need to see dermatologist   3. Vaginal discharge Nuswab sent   4. Rectocele Use stool softner  5. Brain fog Check [facility]  6. Body aches Check [facility]

## 2019-07-12 LAB — FOLLICLE STIMULATING HORMONE: FSH: 56.9 m[IU]/mL

## 2019-07-13 ENCOUNTER — Other Ambulatory Visit: Payer: Self-pay | Admitting: Nurse Practitioner

## 2019-07-13 ENCOUNTER — Other Ambulatory Visit: Payer: Self-pay | Admitting: Adult Health

## 2019-07-13 MED ORDER — ESTRADIOL 1 MG PO TABS: 1.0000 mg | ORAL_TABLET | Freq: Every day | ORAL | 3 refills | Status: DC

## 2019-07-13 NOTE — Progress Notes (Signed)
rx estrace 1 mg

## 2019-07-15 LAB — NUSWAB VAGINITIS (VG)
Candida albicans, NAA: POSITIVE — AB
Candida glabrata, NAA: NEGATIVE
Trich vag by NAA: NEGATIVE

## 2019-07-17 ENCOUNTER — Other Ambulatory Visit: Payer: Self-pay | Admitting: Nurse Practitioner

## 2019-07-27 NOTE — Progress Notes (Addendum)
Cardiology Office Note  Date: 07/30/2019   ID: Theresa [name], DOB 07/02/1970, MRN [id number]  PCP:  Kathyrn Drown, MD  Cardiologist:  Kate Sable, MD Electrophysiologist:  None   Chief Complaint: F/U CP, Palpitations  History of Present Illness: Theresa [name] is a 49 y.o. female with a history of CP, GERD, Murmur, Fibromyalgia, IFG, OCD, Depression, Anxiety, Seizure, H/A,Osteoarthritis.  Last Seen by Dr Bronson Ing 05/26/2018 for evaluation of CP on / off for several months prior to that visit. She believed symptoms were r/t anxiety and stress. In PCP notes she had been experiencing fluctuating BP, SOB, nausea, dizziness. Symptoms were relieved with rest. She c/o of sensation of food getting stuck in her throat and epigastric pain / LLQ pain after eating.  Hx of gastritis, constipation, GERD. Previous EGD showed multiple gastric polyps, non-erosive gastritis, diverticulum in the 2nd part of the duodenum.  It appears that an exercise treadmill stress / tolerance test was ordered on 05/26/2018 but never performed.   She recently saw APP at PCP office 05/18/2019 for fatigue and complete loss of energy, body aches, and dizziness with movement. Also c/o intermittent CP (left mid localized), and palpitations when she is upset occurring at least a couple of times per week described as more frequent than usual and can hear her heart beat in her ears. Recently loss around 23 lbs.   Patient states she has been having rapid heart rate/palpitations.  States her Apple Watch alerts her to fast heart rate running in the 100s.  States she sometimes feels dizzy and feels her heart beating in her ears.  She denies any orthostatic symptoms, or presyncope/syncopal episodes.  States when these occur she sometimes has chest pain/chest pressure.  However she states when she performs normal activities she has no anginal symptoms.  She recently saw her PCP who encouraged her to follow-up with  cardiology.  Past Medical History:  Diagnosis Date  . Anxiety   . Depression   . Fibromyalgia   . GERD (gastroesophageal reflux disease)   . Headache(784.0)   . Heart murmur   . IFG (impaired fasting glucose)   . Obsessive-compulsive disorder   . Osteoarthritis   . Seizures ([place])    last seizure at age 37-stressed induced seizure.    Past Surgical History:  Procedure Laterality Date  . ABDOMINAL HYSTERECTOMY     still has right ovary  . BIOPSY N/A 03/05/2014   Procedure: DUODENAL AND GASTRIC BIOPSY;  Surgeon: Danie Binder, MD;  Location: [facility];  Service: Endoscopy;  Laterality: N/A;  . bladder sling X 3    . CHOLECYSTECTOMY    . COLONOSCOPY  04/24/2012   [id number] polyp measuring 5 mm in size was found in the proximal transverse colon; polypectomy was performed with cold forceps Pedunculated polyp measuring 1.1 cm in size was found in the sigmoid colon; polypectomy was performed using snare cautery Moderate diverticulosis was noted in the descending colon and sigmoid colon small internal hemorrhoids. next tcs 04/2017  . ESOPHAGOGASTRODUODENOSCOPY N/A 02/18/2014   Procedure: ESOPHAGOGASTRODUODENOSCOPY (EGD);  Surgeon: Danie Binder, MD;  Location: [facility] SUITE;  Service: Endoscopy;  Laterality: N/A;  115  . ESOPHAGOGASTRODUODENOSCOPY (EGD) WITH PROPOFOL N/A 03/05/2014   SLF: 1. dyspepsia due to constipation, gastritis, and GERD 2. Multiple Gastric polyps 3. Moderate non-erosive gastritis 4. Diverticulum in the 2nd part of the duodenum  . TUBAL LIGATION      Current Outpatient Medications  Medication Sig Dispense Refill  .  Biotin 10 MG TABS Take by mouth.    . cyclobenzaprine (FLEXERIL) 10 MG tablet Take 1 tablet (10 mg total) by mouth at bedtime. 15 tablet 0  . dexlansoprazole (DEXILANT) 60 MG capsule Take 1 capsule (60 mg total) by mouth daily. Prn acid reflux 30 capsule 2  . diazepam (VALIUM) 5 MG tablet TAKE 1/2 TO 1 TABLET BY MOUTH ONCE DAILY AS NEEDED FOR ANXIETY 30  tablet 0  . Emollient (COLLAGEN EX) Apply topically.    Marland Kitchen estradiol (ESTRACE) 1 MG tablet Take 1 tablet (1 mg total) by mouth daily. 30 tablet 3  . lisinopril-hydrochlorothiazide (ZESTORETIC) 20-25 MG tablet TAKE 1 TABLET BY MOUTH ONCE DAILY. 90 tablet 0  . Magnesium 400 MG CAPS Take by mouth.    . naproxen (NAPROSYN) 500 MG tablet Take 1 tablet (500 mg total) by mouth 2 (two) times daily. 30 tablet 0  . Omega 3-6-9 Fatty Acids (OMEGA 3-6-9 COMPLEX PO) Take by mouth.    . Probiotic Product (PROBIOTIC-10 PO) Take by mouth.    . rizatriptan (MAXALT-MLT) 10 MG disintegrating tablet TAKE 1 TABLET AS NEEDED FOR MIGRAIN, MAY REPEAT IN 2 HOURS MAX 2/24 HOURS 10 tablet 6   No current facility-administered medications for this visit.   Allergies:  Patient has no known allergies.   Social History: The patient  reports that she has never smoked. She has never used smokeless tobacco. She reports current alcohol use. She reports that she does not use drugs.   Family History: The patient's family history includes Alcohol abuse in her father and mother; Anxiety disorder in her mother; Depression in her mother; Hypertension in her father and mother; Paranoid behavior in her mother; Physical abuse in her brother.   ROS:  Please see the history of present illness. Otherwise, complete review of systems is positive for none.  All other systems are reviewed and negative.   Physical Exam: VS:  BP 120/80   Pulse 68   Ht 5\' 5"  (1.651 m)   Wt 220 lb (99.8 kg)   BMI 36.61 kg/m , BMI Body mass index is 36.61 kg/m.  Wt Readings from Last 3 Encounters:  07/30/19 220 lb (99.8 kg)  07/11/19 223 lb 3.2 oz (101.2 kg)  05/18/19 234 lb 9.6 oz (106.4 kg)    General: Patient appears comfortable at rest. Neck: Supple, no elevated JVP or carotid bruits, no thyromegaly. Lungs: Clear to auscultation, nonlabored breathing at rest. Cardiac: Regular rate and rhythm, no S3 or significant systolic murmur, no pericardial  rub. Extremities: No pitting edema, distal pulses 2+. Skin: Warm and dry. Musculoskeletal: No kyphosis. Neuropsychiatric: Alert and oriented x3, affect grossly appropriate.  ECG:  An ECG dated 07/30/2019. was personally reviewed today and demonstrated:  Normal sinus rhythm rate of 67.  No acute ST or T wave changes, normal axis, no hypertrophy.  Recent Labwork: 12/20/2018: BUN 8; Creatinine, Ser 0.76; Potassium 4.4; Sodium 142; TSH 1.290 05/18/2019: ALT 26; AST 24; Hemoglobin 12.9; Platelets 275     Component Value Date/Time   CHOL 194 10/05/2017 0924   TRIG 101 10/05/2017 0924   HDL 64 10/05/2017 0924   CHOLHDL 3.0 10/05/2017 0924   CHOLHDL 2.9 10/21/2015 1256   VLDL 20 10/21/2015 1256   LDLCALC 110 (H) 10/05/2017 0924    Other Studies Reviewed Today:   Assessment and Plan:  1. Chest pain, unspecified type   2. Essential hypertension   3. IFG (impaired fasting glucose)   4. Gastroesophageal reflux disease, unspecified whether  esophagitis present   5. Palpitations    1. Chest pain, unspecified type Patient states she has chest pain only when she has palpitations or rapid heart rate.  She denies any chest pain or dyspnea on exertion.  Get a GXT to assess exercise tolerance and chest pain.  2. Essential hypertension Blood pressure is well controlled on lisinopril/hydrochlorothiazide.  Continue lisinopril/hydrochlorothiazide 20/25 mg daily.  3. IFG (impaired fasting glucose) Managed by PCP recent hemoglobin A1c was 5.8.  4. Gastroesophageal reflux disease, unspecified whether esophagitis present Patient has history of GERD and is on Dexilant for management 60 mg/day.  5. Palpitations Get 14-day Zio patch for complaints of palpitations and rapid heart rates.   Medication Adjustments/Labs and Tests Ordered: Current medicines are reviewed at length with the patient today.  Concerns regarding medicines are outlined above.   Disposition: Follow-up with Dr. Bronson Ing or APP 6  to 8 weeks  Signed, Levell July, NP 07/30/2019 12:54 PM    [facility] [place], [place], [place] [address] Phone: [phone]; Fax: [phone]

## 2019-07-30 ENCOUNTER — Telehealth: Payer: Self-pay | Admitting: Family Medicine

## 2019-07-30 ENCOUNTER — Other Ambulatory Visit: Payer: Self-pay

## 2019-07-30 ENCOUNTER — Ambulatory Visit (INDEPENDENT_AMBULATORY_CARE_PROVIDER_SITE_OTHER): Payer: BC Managed Care – PPO | Admitting: Family Medicine

## 2019-07-30 ENCOUNTER — Encounter: Payer: Self-pay | Admitting: Family Medicine

## 2019-07-30 ENCOUNTER — Encounter: Payer: Self-pay | Admitting: *Deleted

## 2019-07-30 VITALS — BP 120/80 | HR 68 | Ht 65.0 in | Wt 220.0 lb

## 2019-07-30 DIAGNOSIS — R079 Chest pain, unspecified: Secondary | ICD-10-CM

## 2019-07-30 DIAGNOSIS — R002 Palpitations: Secondary | ICD-10-CM

## 2019-07-30 DIAGNOSIS — I10 Essential (primary) hypertension: Secondary | ICD-10-CM

## 2019-07-30 DIAGNOSIS — R7301 Impaired fasting glucose: Secondary | ICD-10-CM

## 2019-07-30 DIAGNOSIS — K219 Gastro-esophageal reflux disease without esophagitis: Secondary | ICD-10-CM | POA: Diagnosis not present

## 2019-07-30 NOTE — Telephone Encounter (Signed)
  Precert needed for:  GXT   Location: [facility]     Date: August 13, 2019

## 2019-07-30 NOTE — Patient Instructions (Signed)
Medication Instructions:   Your physician recommends that you continue on your current medications as directed. Please refer to the Current Medication list given to you today.  Labwork:  NONE  Testing/Procedures: Your physician has requested that you have an exercise tolerance test. For further information please visit [email or web address]. Please also follow instruction sheet, as given. Your physician has recommended that you wear an event monitor for 14 days. Event monitors are medical devices that record the heart's electrical activity. Doctors most often [place] these monitors to diagnose arrhythmias. Arrhythmias are problems with the speed or rhythm of the heartbeat. The monitor is a small, portable device. You can wear one while you do your normal daily activities. This is usually used to diagnose what is causing palpitations/syncope (passing out). [employer] will contact you about this monitor.  Follow-Up:  Your physician recommends that you schedule a follow-up appointment in: 6-8 weeks (office).  Any Other Special Instructions Will Be Listed Below (If Applicable).  If you need a refill on your cardiac medications before your next appointment, please call your pharmacy.

## 2019-07-30 NOTE — Telephone Encounter (Signed)
Precert for Monitor

## 2019-07-31 ENCOUNTER — Ambulatory Visit (INDEPENDENT_AMBULATORY_CARE_PROVIDER_SITE_OTHER): Payer: BC Managed Care – PPO

## 2019-07-31 ENCOUNTER — Telehealth: Payer: Self-pay | Admitting: Family Medicine

## 2019-07-31 DIAGNOSIS — R002 Palpitations: Secondary | ICD-10-CM | POA: Diagnosis not present

## 2019-07-31 NOTE — Telephone Encounter (Signed)
Message sent through my chart

## 2019-07-31 NOTE — Telephone Encounter (Signed)
[employer] called stating that they cannot reach the patient.  [phone].

## 2019-08-01 DIAGNOSIS — R002 Palpitations: Secondary | ICD-10-CM | POA: Diagnosis not present

## 2019-08-09 ENCOUNTER — Encounter: Payer: Self-pay | Admitting: Nurse Practitioner

## 2019-08-10 ENCOUNTER — Other Ambulatory Visit: Payer: Self-pay | Admitting: Nurse Practitioner

## 2019-08-10 MED ORDER — AZITHROMYCIN 250 MG PO TABS: ORAL_TABLET | ORAL | 0 refills | Status: DC

## 2019-08-13 ENCOUNTER — Encounter: Payer: Self-pay | Admitting: Family Medicine

## 2019-08-13 ENCOUNTER — Encounter (HOSPITAL_COMMUNITY): Payer: BC Managed Care – PPO

## 2019-08-13 ENCOUNTER — Encounter (HOSPITAL_COMMUNITY): Payer: Self-pay | Admitting: Emergency Medicine

## 2019-08-13 ENCOUNTER — Other Ambulatory Visit: Payer: Self-pay

## 2019-08-13 ENCOUNTER — Observation Stay (HOSPITAL_COMMUNITY)
Admission: EM | Admit: 2019-08-13 | Discharge: 2019-08-14 | Disposition: A | Discharge status: Home / Self Care | Payer: BC Managed Care – PPO | Attending: Internal Medicine | Admitting: Internal Medicine

## 2019-08-13 ENCOUNTER — Ambulatory Visit
Admission: EM | Admit: 2019-08-13 | Discharge: 2019-08-13 | Disposition: A | Discharge status: Home / Self Care | Payer: BC Managed Care – PPO | Source: Home / Self Care

## 2019-08-13 ENCOUNTER — Observation Stay (HOSPITAL_COMMUNITY): Payer: BC Managed Care – PPO

## 2019-08-13 ENCOUNTER — Emergency Department (HOSPITAL_COMMUNITY): Payer: BC Managed Care – PPO

## 2019-08-13 DIAGNOSIS — R1032 Left lower quadrant pain: Secondary | ICD-10-CM | POA: Diagnosis not present

## 2019-08-13 DIAGNOSIS — K5732 Diverticulitis of large intestine without perforation or abscess without bleeding: Secondary | ICD-10-CM | POA: Diagnosis not present

## 2019-08-13 DIAGNOSIS — F419 Anxiety disorder, unspecified: Secondary | ICD-10-CM | POA: Insufficient documentation

## 2019-08-13 DIAGNOSIS — R7401 Elevation of levels of liver transaminase levels: Secondary | ICD-10-CM | POA: Diagnosis not present

## 2019-08-13 DIAGNOSIS — K5792 Diverticulitis of intestine, part unspecified, without perforation or abscess without bleeding: Secondary | ICD-10-CM

## 2019-08-13 DIAGNOSIS — R7301 Impaired fasting glucose: Secondary | ICD-10-CM

## 2019-08-13 DIAGNOSIS — I10 Essential (primary) hypertension: Secondary | ICD-10-CM | POA: Insufficient documentation

## 2019-08-13 DIAGNOSIS — R109 Unspecified abdominal pain: Secondary | ICD-10-CM

## 2019-08-13 DIAGNOSIS — E876 Hypokalemia: Secondary | ICD-10-CM

## 2019-08-13 DIAGNOSIS — Z03818 Encounter for observation for suspected exposure to other biological agents ruled out: Secondary | ICD-10-CM | POA: Diagnosis not present

## 2019-08-13 DIAGNOSIS — K219 Gastro-esophageal reflux disease without esophagitis: Secondary | ICD-10-CM | POA: Insufficient documentation

## 2019-08-13 DIAGNOSIS — Z20822 Contact with and (suspected) exposure to covid-19: Secondary | ICD-10-CM | POA: Insufficient documentation

## 2019-08-13 DIAGNOSIS — Z79899 Other long term (current) drug therapy: Secondary | ICD-10-CM | POA: Diagnosis not present

## 2019-08-13 LAB — COMPREHENSIVE METABOLIC PANEL
ALT: 50 U/L — ABNORMAL HIGH (ref 0–44)
AST: 42 U/L — ABNORMAL HIGH (ref 15–41)
Albumin: 3.9 g/dL (ref 3.5–5.0)
Alkaline Phosphatase: 78 U/L (ref 38–126)
Anion gap: 13 (ref 5–15)
BUN: 10 mg/dL (ref 6–20)
CO2: 24 mmol/L (ref 22–32)
Calcium: 8.8 mg/dL — ABNORMAL LOW (ref 8.9–10.3)
Chloride: 98 mmol/L (ref 98–111)
Creatinine, Ser: 0.76 mg/dL (ref 0.44–1.00)
GFR calc Af Amer: >60 mL/min (ref >60)
GFR calc non Af Amer: >60 mL/min (ref >60)
Glucose, Bld: 127 mg/dL — ABNORMAL HIGH (ref 70–99)
Potassium: 3.2 mmol/L — ABNORMAL LOW (ref 3.5–5.1)
Sodium: 135 mmol/L (ref 135–145)
Total Bilirubin: 1.3 mg/dL — ABNORMAL HIGH (ref 0.3–1.2)
Total Protein: 7.3 g/dL (ref 6.5–8.1)

## 2019-08-13 LAB — URINALYSIS, ROUTINE W REFLEX MICROSCOPIC
Bilirubin Urine: NEGATIVE
Glucose, UA: NEGATIVE mg/dL
Hgb urine dipstick: NEGATIVE
Ketones, ur: NEGATIVE mg/dL
Leukocytes,Ua: NEGATIVE
Nitrite: NEGATIVE
Protein, ur: NEGATIVE mg/dL
Specific Gravity, Urine: 1.023 (ref 1.005–1.030)
pH: 6 (ref 5.0–8.0)

## 2019-08-13 LAB — CBC WITH DIFFERENTIAL/PLATELET
Abs Immature Granulocytes: 0.03 10*3/uL (ref 0.00–0.07)
Basophils Absolute: 0 10*3/uL (ref 0.0–0.1)
Basophils Relative: 0 %
Eosinophils Absolute: 0 10*3/uL (ref 0.0–0.5)
Eosinophils Relative: 0 %
HCT: 37 % (ref 36.0–46.0)
Hemoglobin: 12.3 g/dL (ref 12.0–15.0)
Immature Granulocytes: 0 %
Lymphocytes Relative: 9 %
Lymphs Abs: 1 10*3/uL (ref 0.7–4.0)
MCH: 30.8 pg (ref 26.0–34.0)
MCHC: 33.2 g/dL (ref 30.0–36.0)
MCV: 92.5 fL (ref 80.0–100.0)
Monocytes Absolute: 0.9 10*3/uL (ref 0.1–1.0)
Monocytes Relative: 8 %
Neutro Abs: 8.8 10*3/uL — ABNORMAL HIGH (ref 1.7–7.7)
Neutrophils Relative %: 83 %
Platelets: 214 10*3/uL (ref 150–400)
RBC: 4 MIL/uL (ref 3.87–5.11)
RDW: 13.3 % (ref 11.5–15.5)
WBC: 10.8 10*3/uL — ABNORMAL HIGH (ref 4.0–10.5)
nRBC: 0 % (ref 0.0–0.2)

## 2019-08-13 LAB — HEPATITIS PANEL, ACUTE
HCV Ab: NONREACTIVE
Hep A IgM: NONREACTIVE
Hep B C IgM: NONREACTIVE
Hepatitis B Surface Ag: NONREACTIVE

## 2019-08-13 LAB — LIPASE, BLOOD: Lipase: 15 U/L (ref 11–51)

## 2019-08-13 LAB — HIV ANTIBODY (ROUTINE TESTING W REFLEX): HIV Screen 4th Generation wRfx: NONREACTIVE

## 2019-08-13 MED ORDER — ACETAMINOPHEN 650 MG RE SUPP: 650.0000 mg | Freq: Four times a day (QID) | RECTAL | Status: DC | PRN

## 2019-08-13 MED ORDER — SODIUM CHLORIDE 0.9 % IV SOLN
2.0000 g | INTRAVENOUS | Status: DC
  Administered 2019-08-13: 2 g via INTRAVENOUS
  Filled 2019-08-13: qty 20

## 2019-08-13 MED ORDER — ONDANSETRON HCL 4 MG/2ML IJ SOLN
4.0000 mg | Freq: Four times a day (QID) | INTRAMUSCULAR | Status: DC | PRN
  Administered 2019-08-14: 4 mg via INTRAVENOUS
  Filled 2019-08-13: qty 2

## 2019-08-13 MED ORDER — IOHEXOL 300 MG/ML  SOLN
100.0000 mL | Freq: Once | INTRAMUSCULAR | Status: AC | PRN
  Administered 2019-08-13: 100 mL via INTRAVENOUS

## 2019-08-13 MED ORDER — ESTRADIOL 1 MG PO TABS
1.0000 mg | ORAL_TABLET | Freq: Every day | ORAL | Status: DC
  Administered 2019-08-14: 1 mg via ORAL
  Filled 2019-08-13 (×6): qty 1

## 2019-08-13 MED ORDER — CIPROFLOXACIN IN D5W 400 MG/200ML IV SOLN
400.0000 mg | Freq: Once | INTRAVENOUS | Status: AC
  Administered 2019-08-13: 13:00:00 400 mg via INTRAVENOUS
  Filled 2019-08-13: qty 200

## 2019-08-13 MED ORDER — ACETAMINOPHEN 325 MG PO TABS
650.0000 mg | ORAL_TABLET | Freq: Four times a day (QID) | ORAL | Status: DC | PRN
  Administered 2019-08-14 (×2): 650 mg via ORAL
  Filled 2019-08-13 (×2): qty 2

## 2019-08-13 MED ORDER — MORPHINE SULFATE (PF) 4 MG/ML IV SOLN
4.0000 mg | Freq: Once | INTRAVENOUS | Status: AC
  Administered 2019-08-13: 10:00:00 4 mg via INTRAVENOUS
  Filled 2019-08-13: qty 1

## 2019-08-13 MED ORDER — FENTANYL CITRATE (PF) 100 MCG/2ML IJ SOLN
50.0000 ug | Freq: Once | INTRAMUSCULAR | Status: AC
  Administered 2019-08-13: 12:00:00 50 ug via INTRAVENOUS
  Filled 2019-08-13: qty 2

## 2019-08-13 MED ORDER — METRONIDAZOLE IN NACL 5-0.79 MG/ML-% IV SOLN
500.0000 mg | Freq: Three times a day (TID) | INTRAVENOUS | Status: DC
  Administered 2019-08-13 – 2019-08-14 (×3): 500 mg via INTRAVENOUS
  Filled 2019-08-13 (×4): qty 100

## 2019-08-13 MED ORDER — ENOXAPARIN SODIUM 60 MG/0.6ML ~~LOC~~ SOLN
50.0000 mg | SUBCUTANEOUS | Status: DC
  Filled 2019-08-13: qty 0.6

## 2019-08-13 MED ORDER — POTASSIUM CHLORIDE CRYS ER 20 MEQ PO TBCR
20.0000 meq | EXTENDED_RELEASE_TABLET | Freq: Once | ORAL | Status: AC
  Administered 2019-08-13: 20 meq via ORAL
  Filled 2019-08-13: qty 1

## 2019-08-13 MED ORDER — DIAZEPAM 2 MG PO TABS
1.0000 mg | ORAL_TABLET | Freq: Three times a day (TID) | ORAL | Status: DC | PRN
  Administered 2019-08-13: 1 mg via ORAL
  Filled 2019-08-13: qty 1

## 2019-08-13 MED ORDER — ONDANSETRON HCL 4 MG/2ML IJ SOLN
4.0000 mg | Freq: Once | INTRAMUSCULAR | Status: AC
  Administered 2019-08-13: 4 mg via INTRAVENOUS
  Filled 2019-08-13: qty 2

## 2019-08-13 MED ORDER — METRONIDAZOLE IN NACL 5-0.79 MG/ML-% IV SOLN
500.0000 mg | Freq: Once | INTRAVENOUS | Status: AC
  Administered 2019-08-13: 500 mg via INTRAVENOUS
  Filled 2019-08-13: qty 100

## 2019-08-13 MED ORDER — PANTOPRAZOLE SODIUM 40 MG PO TBEC
40.0000 mg | DELAYED_RELEASE_TABLET | Freq: Every day | ORAL | Status: DC
  Administered 2019-08-14: 10:00:00 40 mg via ORAL
  Filled 2019-08-13 (×3): qty 1

## 2019-08-13 MED ORDER — SODIUM CHLORIDE 0.9 % IV BOLUS
1000.0000 mL | Freq: Once | INTRAVENOUS | Status: AC
  Administered 2019-08-13: 1000 mL via INTRAVENOUS

## 2019-08-13 MED ORDER — POTASSIUM CHLORIDE IN NACL 20-0.9 MEQ/L-% IV SOLN: INTRAVENOUS | Status: DC

## 2019-08-13 MED ORDER — ONDANSETRON HCL 4 MG PO TABS: 4.0000 mg | ORAL_TABLET | Freq: Four times a day (QID) | ORAL | Status: DC | PRN

## 2019-08-13 MED ORDER — HYDROMORPHONE HCL 1 MG/ML IJ SOLN
0.5000 mg | INTRAMUSCULAR | Status: DC | PRN
  Administered 2019-08-13 – 2019-08-14 (×3): 0.5 mg via INTRAVENOUS
  Filled 2019-08-13 (×2): qty 0.5
  Filled 2019-08-13: qty 1
  Filled 2019-08-13: qty 0.5

## 2019-08-13 NOTE — ED Provider Notes (Signed)
[facility] EMERGENCY DEPARTMENT Provider Note   CSN: [id number] Arrival date & time: 08/13/19  3212     History Chief Complaint  Patient presents with  . Abdominal Pain    Theresa [name] is a 49 y.o. female.  The history is provided by the patient.  Abdominal Pain Pain location:  LLQ Pain quality: aching, bloating and cramping   Pain radiates to:  Does not radiate Pain severity:  Severe Onset quality:  Gradual Duration:  2 days Timing:  Constant Progression:  Worsening Chronicity:  Recurrent Context comment:  Pt reports history of diverticulitis.   Relieved by:  Not moving Worsened by:  Movement and palpation Ineffective treatments:  None tried Associated symptoms: anorexia, chills, constipation, dysuria and nausea   Associated symptoms: no chest pain, no diarrhea, no fever, no hematochezia, no hematuria, no shortness of breath, no sore throat and no vomiting   Associated symptoms comment:  Reports chronic constipation at baseline.  Last bm 2 days ago.  Takes daily stool softeners. No documented fevers but has had cold chills.  Urination worsens pain, denies increased urinary frequency or hematuria.      Past Medical History:  Diagnosis Date  . Anxiety   . Depression   . Fibromyalgia   . GERD (gastroesophageal reflux disease)   . Headache(784.0)   . Heart murmur   . IFG (impaired fasting glucose)   . Obsessive-compulsive disorder   . Osteoarthritis   . Seizures ([place])    last seizure at age 9-stressed induced seizure.    Patient Active Problem List   Diagnosis Date Noted  . Vaginal discharge 07/11/2019  . Hair loss 07/11/2019  . Body aches 07/11/2019  . Brain fog 07/11/2019  . Rectocele 07/11/2019  . Sleep disturbance 07/11/2019  . Gastroesophageal reflux disease without esophagitis 05/21/2019  . Prediabetes 11/26/2017  . Migraine without aura and without status migrainosus, not intractable 10/04/2017  . Morbid obesity ([place]) 04/10/2016  .  Diverticulosis of large intestine 12/05/2015  . BRCA negative 02/10/2015  . IFG (impaired fasting glucose) 07/05/2014  . Essential hypertension, benign 04/27/2014  . Anxiety 04/27/2014  . Abdominal pain, epigastric 02/07/2014  . Nausea without vomiting 02/07/2014  . Bloating 02/07/2014  . Fatigue 09/18/2012  . Hyperglycemia 09/18/2012  . Dysthymia 07/14/2012  . Insomnia due to mental disorder 07/14/2012  . Lower abdominal pain 04/12/2012  . Headache(784.0) 04/12/2012  . Constipation 04/12/2012    Past Surgical History:  Procedure Laterality Date  . ABDOMINAL HYSTERECTOMY     still has right ovary  . BIOPSY N/A 03/05/2014   Procedure: DUODENAL AND GASTRIC BIOPSY;  Surgeon: Danie Binder, MD;  Location: [facility];  Service: Endoscopy;  Laterality: N/A;  . bladder sling X 3    . CHOLECYSTECTOMY    . COLONOSCOPY  04/24/2012   YQM:GNOIBBC polyp measuring 5 mm in size was found in the proximal transverse colon; polypectomy was performed with cold forceps Pedunculated polyp measuring 1.1 cm in size was found in the sigmoid colon; polypectomy was performed using snare cautery Moderate diverticulosis was noted in the descending colon and sigmoid colon small internal hemorrhoids. next tcs 04/2017  . ESOPHAGOGASTRODUODENOSCOPY N/A 02/18/2014   Procedure: ESOPHAGOGASTRODUODENOSCOPY (EGD);  Surgeon: Danie Binder, MD;  Location: [facility] SUITE;  Service: Endoscopy;  Laterality: N/A;  115  . ESOPHAGOGASTRODUODENOSCOPY (EGD) WITH PROPOFOL N/A 03/05/2014   SLF: 1. dyspepsia due to constipation, gastritis, and GERD 2. Multiple Gastric polyps 3. Moderate non-erosive gastritis 4. Diverticulum in the 2nd  part of the duodenum  . TUBAL LIGATION       OB History    Gravida  2   Para  2   Term      Preterm      AB  0   Living  2     SAB      TAB      Ectopic  0   Multiple      Live Births              Family History  Problem Relation Age of Onset  . Alcohol abuse Mother   .  Depression Mother   . Anxiety disorder Mother   . Paranoid behavior Mother   . Hypertension Mother   . Alcohol abuse Father   . Hypertension Father   . Physical abuse Brother   . Colon cancer Neg Hx   . ADD / ADHD Neg Hx   . Bipolar disorder Neg Hx   . Dementia Neg Hx   . OCD Neg Hx   . Drug abuse Neg Hx   . Schizophrenia Neg Hx   . Seizures Neg Hx   . Sexual abuse Neg Hx     Social History   Tobacco Use  . Smoking status: Never Smoker  . Smokeless tobacco: Never Used  . Tobacco comment: as a kid  Substance Use Topics  . Alcohol use: Yes  . Drug use: No    Home Medications Prior to Admission medications   Medication Sig Start Date End Date Taking? Authorizing Provider  dexlansoprazole (DEXILANT) 60 MG capsule Take 1 capsule (60 mg total) by mouth daily. Prn acid reflux Patient taking differently: Take 60 mg by mouth daily.  05/24/19  Yes Pearson Forster C, NP  diazepam (VALIUM) 5 MG tablet TAKE 1/2 TO 1 TABLET BY MOUTH ONCE DAILY AS NEEDED FOR ANXIETY 05/24/19  Yes Nilda Simmer, NP  estradiol (ESTRACE) 1 MG tablet Take 1 tablet (1 mg total) by mouth daily. 07/13/19 07/12/20 Yes Derrek Monaco A, NP  lisinopril-hydrochlorothiazide (ZESTORETIC) 20-25 MG tablet TAKE 1 TABLET BY MOUTH ONCE DAILY. 05/24/19  Yes Nilda Simmer, NP  Magnesium 400 MG CAPS Take 1 tablet by mouth daily.    Yes [provider]  Omega 3-6-9 Fatty Acids (OMEGA 3-6-9 COMPLEX PO) Take by mouth.   Yes [provider]  Probiotic Product (PROBIOTIC-10 PO) Take by mouth.   Yes [provider]  rizatriptan (MAXALT-MLT) 10 MG disintegrating tablet TAKE 1 TABLET AS NEEDED FOR MIGRAIN, MAY REPEAT IN 2 HOURS MAX 2/24 HOURS 03/19/19  Yes Luking, Elayne Snare, MD  azithromycin (ZITHROMAX Z-PAK) 250 MG tablet Take 2 tablets (500 mg) on  Day 1,  followed by 1 tablet (250 mg) once daily on Days 2 through 5. 08/10/19   Nilda Simmer, NP  cyclobenzaprine (FLEXERIL) 10 MG tablet Take 1  tablet (10 mg total) by mouth at bedtime. Patient not taking: Reported on 08/13/2019 03/28/19   Wurst, [place], PA-C  naproxen (NAPROSYN) 500 MG tablet Take 1 tablet (500 mg total) by mouth 2 (two) times daily. Patient not taking: Reported on 08/13/2019 03/28/19   Stacey Drain [place], PA-C  buPROPion (WELLBUTRIN XL) 150 MG 24 hr tablet Take one tab qam Patient not taking: Reported on 03/19/2019 01/12/19 03/28/19  Nilda Simmer, NP    Allergies    Patient has no known allergies.  Review of Systems   Review of Systems  Constitutional: Positive for appetite change and chills.  Negative for fever.  HENT: Negative for congestion and sore throat.   Eyes: Negative.   Respiratory: Negative for chest tightness and shortness of breath.   Cardiovascular: Negative for chest pain.  Gastrointestinal: Positive for abdominal pain, anorexia, constipation and nausea. Negative for diarrhea, hematochezia and vomiting.  Genitourinary: Positive for dysuria. Negative for flank pain, hematuria and urgency.  Musculoskeletal: Negative for arthralgias, joint swelling and neck pain.  Skin: Negative.  Negative for rash and wound.  Neurological: Negative for dizziness, weakness, light-headedness, numbness and headaches.  Psychiatric/Behavioral: Negative.     Physical Exam Updated Vital Signs BP 112/74   Pulse 92   Temp 99.3 F (37.4 C) (Oral)   Resp 16   Ht 5' 5" (1.651 m)   Wt 99.8 kg   SpO2 99%   BMI 36.61 kg/m   Physical Exam Vitals and nursing note reviewed.  Constitutional:      Appearance: She is well-developed.  HENT:     Head: Normocephalic and atraumatic.  Eyes:     Conjunctiva/sclera: Conjunctivae normal.  Cardiovascular:     Rate and Rhythm: Normal rate and regular rhythm.     Heart sounds: Normal heart sounds.  Pulmonary:     Effort: Pulmonary effort is normal. No respiratory distress.     Breath sounds: Normal breath sounds. No wheezing.  Abdominal:     General: Bowel sounds are  normal.     Palpations: Abdomen is soft.     Tenderness: There is abdominal tenderness in the epigastric area and left lower quadrant. There is guarding. There is no rebound.     Comments: Guarding LLQ  Musculoskeletal:        General: Normal range of motion.     Cervical back: Normal range of motion.  Skin:    General: Skin is warm and dry.  Neurological:     Mental Status: She is alert.     ED Results / Procedures / Treatments   Labs (all labs ordered are listed, but only abnormal results are displayed) Labs Reviewed  CBC WITH DIFFERENTIAL/PLATELET - Abnormal; Notable for the following components:      Result Value   WBC 10.8 (*)    Neutro Abs 8.8 (*)    All other components within normal limits  COMPREHENSIVE METABOLIC PANEL - Abnormal; Notable for the following components:   Potassium 3.2 (*)    Glucose, Bld 127 (*)    Calcium 8.8 (*)    AST 42 (*)    ALT 50 (*)    Total Bilirubin 1.3 (*)    All other components within normal limits  URINALYSIS, ROUTINE W REFLEX MICROSCOPIC - Abnormal; Notable for the following components:   Color, Urine STRAW (*)    All other components within normal limits  LIPASE, BLOOD  HEPATITIS PANEL, ACUTE    EKG None  Radiology CT Abdomen Pelvis W Contrast  Result Date: 08/13/2019 CLINICAL DATA:  Left lower abdominal pain for 3 days with nausea. EXAM: CT ABDOMEN AND PELVIS WITH CONTRAST TECHNIQUE: Multidetector CT imaging of the abdomen and pelvis was performed using the standard protocol following bolus administration of intravenous contrast. CONTRAST:  128m OMNIPAQUE IOHEXOL 300 MG/ML  SOLN COMPARISON:  None. FINDINGS: Lower chest: Lung bases are clear. Heart size normal. No pericardial or pleural effusion. Distal esophagus is unremarkable. Hepatobiliary: Liver is unremarkable. Cholecystectomy. No biliary ductal dilatation. Pancreas: Negative. Spleen: Negative. Adrenals/Urinary Tract: Adrenal glands and kidneys are unremarkable. Ureters are  decompressed. Bladder is grossly unremarkable. Stomach/Bowel: Stomach  is unremarkable. Duodenal diverticulum is incidentally noted. Small bowel, appendix and majority of the colon are unremarkable. Wall thickening, diverticula and extensive pericolonic haziness/stranding are seen in the proximal sigmoid colon. No extraluminal air or organized fluid collection. Vascular/Lymphatic: Vascular structures are unremarkable. No pathologically enlarged lymph nodes. Likely tiny reactive lymph nodes in the sigmoid mesocolon. Reproductive: Hysterectomy.  No adnexal mass. Other: Trace pelvic free fluid. Mesenteries and peritoneum are otherwise unremarkable. Musculoskeletal: Degenerative disc disease at L5-S1. No worrisome lytic or sclerotic lesions. IMPRESSION: Sigmoid diverticulitis without rupture or abscess. Electronically Signed   By: Lorin Picket M.D.   On: 08/13/2019 10:29    Procedures Procedures (including critical care time)  Medications Ordered in ED Medications  ciprofloxacin (CIPRO) IVPB 400 mg (400 mg Intravenous New Bag/Given 08/13/19 1238)  potassium chloride SA (KLOR-CON) CR tablet 20 mEq (has no administration in time range)  morphine 4 MG/ML injection 4 mg (4 mg Intravenous Given 08/13/19 0938)  ondansetron (ZOFRAN) injection 4 mg (4 mg Intravenous Given 08/13/19 0937)  sodium chloride 0.9 % bolus 1,000 mL (0 mLs Intravenous Stopped 08/13/19 1125)  iohexol (OMNIPAQUE) 300 MG/ML solution 100 mL (100 mLs Intravenous Contrast Given 08/13/19 1006)  metroNIDAZOLE (FLAGYL) IVPB 500 mg (0 mg Intravenous Stopped 08/13/19 1230)  fentaNYL (SUBLIMAZE) injection 50 mcg (50 mcg Intravenous Given 08/13/19 1139)    ED Course  I have reviewed the triage vital signs and the nursing notes.  Pertinent labs & imaging results that were available during my care of the patient were reviewed by me and considered in my medical decision making (see chart for details).    MDM Rules/Calculators/A&P                       Patient with acute diverticulitis.  Minimally elevated WBC count.  Her liver enzymes are modestly elevated today.  She reports she has had this abnormal lab finding in the past but on repeat testing was normal range so she has not had further evaluation of this.  She denies Tylenol use, rare EtOH use.  An acute hepatitis panel was added to her blood work today.  She was given multiple doses of pain medication with continued escalation of pain with movement, especially attempts to ambulate.  She was given IV fluids along with IV antibiotics including Flagyl and Cipro.  Given significant pain, I did think she would benefit by overnight observation, continued IV antibiotics and pain control.  Discussed with patient who is agreeable to admission.  Call placed to Dr. Carles Collet who accepts pt for admission.  Final Clinical Impression(s) / ED Diagnoses Final diagnoses:  Acute diverticulitis  Elevated liver transaminase level    Rx / DC Orders ED Discharge Orders    None       Landis Martins 08/13/19 1339    Davonna Belling, MD 08/13/19 1517

## 2019-08-13 NOTE — ED Triage Notes (Signed)
Patient complains of left lower abdominal pain x 3 days. Denies n/v/d

## 2019-08-13 NOTE — Progress Notes (Signed)
History and Physical  MILA PAIR SKA:[id number] DOB: [birth year] DOA: 08/13/2019   PCP: Kathyrn Drown, MD   Patient coming from: Home  Chief Complaint: abdominal pain  HPI:  Theresa [name] is a 49 y.o. female with medical history of hypertension, anxiety, and GERD presenting with 2-day history of abdominal pain that began on 08/11/2019.  She stated that abdominal pain had worsened over the last 24 hours and was located in the left lower quadrant.  She had some nausea without emesis.  She denied any fevers, chills, chest pain, shortness breath, cough, hemoptysis, dysuria, hematuria, diarrhea, hematochezia, melena.  She stated that approximately 4 years ago she had a similar episode and was diagnosed with diverticulitis.  She did not require hospitalization at that time.  She denies any new medications.  She denies any recent travels or eating exotic foods.  She does not drink or use any illicit drugs.  Last bowel movement was on 08/11/2019. In the emergency department, the patient was afebrile and hemodynamically stable albeit with a temperature of 99.3 F.  Oxygen saturation was 99% on room air.  WBC was 10.8.  Potassium was 3.2.  Otherwise BMP, and CBC were unremarkable.  AST 42, ALT 50, alk phosphatase 13, total bilirubin 1.3, lipase 15.  CT of the abdomen and pelvis showed acute sigmoid diverticulitis without rupture or abscess.  The patient was started on IV fluids and intravenous antibiotics.  Assessment/Plan: Acute diverticulitis without abscess or rupture -Patient still had uncontrolled pain in the emergency department despite IV opioids -Continue judicious IV opioids -IV ceftriaxone and metronidazole -IV fluids -Full liquid diet -UA negative for pyuria  Essential hypertension -Holding lisinopril/HCTZ secondary to soft blood pressure  GERD -Continue PPI  Anxiety -Continue home dose Valium  Transaminasemia -Hepatitis B surface antigen -Hepatitis C antibody -Right  upper quadrant ultrasound        Past Medical History:  Diagnosis Date  . Anxiety   . Depression   . Fibromyalgia   . GERD (gastroesophageal reflux disease)   . Headache(784.0)   . Heart murmur   . IFG (impaired fasting glucose)   . Obsessive-compulsive disorder   . Osteoarthritis   . Seizures ([place])    last seizure at age 33-stressed induced seizure.   Past Surgical History:  Procedure Laterality Date  . ABDOMINAL HYSTERECTOMY     still has right ovary  . BIOPSY N/A 03/05/2014   Procedure: DUODENAL AND GASTRIC BIOPSY;  Surgeon: Danie Binder, MD;  Location: [facility];  Service: Endoscopy;  Laterality: N/A;  . bladder sling X 3    . CHOLECYSTECTOMY    . COLONOSCOPY  04/24/2012   OMB:TDHRCBU polyp measuring 5 mm in size was found in the proximal transverse colon; polypectomy was performed with cold forceps Pedunculated polyp measuring 1.1 cm in size was found in the sigmoid colon; polypectomy was performed using snare cautery Moderate diverticulosis was noted in the descending colon and sigmoid colon small internal hemorrhoids. next tcs 04/2017  . ESOPHAGOGASTRODUODENOSCOPY N/A 02/18/2014   Procedure: ESOPHAGOGASTRODUODENOSCOPY (EGD);  Surgeon: Danie Binder, MD;  Location: [facility];  Service: Endoscopy;  Laterality: N/A;  115  . ESOPHAGOGASTRODUODENOSCOPY (EGD) WITH PROPOFOL N/A 03/05/2014   SLF: 1. dyspepsia due to constipation, gastritis, and GERD 2. Multiple Gastric polyps 3. Moderate non-erosive gastritis 4. Diverticulum in the 2nd part of the duodenum  . TUBAL LIGATION     Social History:  reports that she has never smoked. She has  never used smokeless tobacco. She reports current alcohol use. She reports that she does not use drugs.   Family History  Problem Relation Age of Onset  . Alcohol abuse Mother   . Depression Mother   . Anxiety disorder Mother   . Paranoid behavior Mother   . Hypertension Mother   . Alcohol abuse Father   . Hypertension Father   .  Physical abuse Brother   . Colon cancer Neg Hx   . ADD / ADHD Neg Hx   . Bipolar disorder Neg Hx   . Dementia Neg Hx   . OCD Neg Hx   . Drug abuse Neg Hx   . Schizophrenia Neg Hx   . Seizures Neg Hx   . Sexual abuse Neg Hx      No Known Allergies   Prior to Admission medications   Medication Sig Start Date End Date Taking? Authorizing Provider  dexlansoprazole (DEXILANT) 60 MG capsule Take 1 capsule (60 mg total) by mouth daily. Prn acid reflux Patient taking differently: Take 60 mg by mouth daily.  05/24/19  Yes Pearson Forster C, NP  diazepam (VALIUM) 5 MG tablet TAKE 1/2 TO 1 TABLET BY MOUTH ONCE DAILY AS NEEDED FOR ANXIETY 05/24/19  Yes Nilda Simmer, NP  estradiol (ESTRACE) 1 MG tablet Take 1 tablet (1 mg total) by mouth daily. 07/13/19 07/12/20 Yes Derrek Monaco A, NP  lisinopril-hydrochlorothiazide (ZESTORETIC) 20-25 MG tablet TAKE 1 TABLET BY MOUTH ONCE DAILY. 05/24/19  Yes Nilda Simmer, NP  Magnesium 400 MG CAPS Take 1 tablet by mouth daily.    Yes [provider]  Omega 3-6-9 Fatty Acids (OMEGA 3-6-9 COMPLEX PO) Take by mouth.   Yes [provider]  Probiotic Product (PROBIOTIC-10 PO) Take by mouth.   Yes [provider]  rizatriptan (MAXALT-MLT) 10 MG disintegrating tablet TAKE 1 TABLET AS NEEDED FOR MIGRAIN, MAY REPEAT IN 2 HOURS MAX 2/24 HOURS 03/19/19  Yes Luking, Elayne Snare, MD  azithromycin (ZITHROMAX Z-PAK) 250 MG tablet Take 2 tablets (500 mg) on  Day 1,  followed by 1 tablet (250 mg) once daily on Days 2 through 5. 08/10/19   Nilda Simmer, NP  cyclobenzaprine (FLEXERIL) 10 MG tablet Take 1 tablet (10 mg total) by mouth at bedtime. Patient not taking: Reported on 08/13/2019 03/28/19   Wurst, [place], PA-C  naproxen (NAPROSYN) 500 MG tablet Take 1 tablet (500 mg total) by mouth 2 (two) times daily. Patient not taking: Reported on 08/13/2019 03/28/19   Stacey Drain, [place], PA-C  buPROPion (WELLBUTRIN XL) 150 MG 24 hr tablet Take one  tab qam Patient not taking: Reported on 03/19/2019 01/12/19 03/28/19  Nilda Simmer, NP    Review of Systems:  Constitutional:  No weight loss, night sweats, Fevers, chills, fatigue.  Head&Eyes: No headache.  No vision loss.  No eye pain or scotoma ENT:  No Difficulty swallowing,Tooth/dental problems,Sore throat,  No ear ache, post nasal drip,  Cardio-vascular:  No chest pain, Orthopnea, PND, swelling in lower extremities,  dizziness, palpitations  GI:  No   diarrhea, loss of appetite, hematochezia, melena, heartburn, indigestion, Resp:  No shortness of breath with exertion or at rest. No cough. No coughing up of blood .No wheezing.No chest wall deformity  Skin:  no rash or lesions.  GU:  no dysuria, change in color of urine, no urgency or frequency. No flank pain.  Musculoskeletal:  No joint pain or swelling. No decreased range of motion. No back pain.  Psych:  No change in mood or affect. No depression or anxiety. Neurologic: No headache, no dysesthesia, no focal weakness, no vision loss. No syncope  Physical Exam: Vitals:   08/13/19 0900 08/13/19 0901 08/13/19 1136  BP: 105/75  112/74  Pulse: 92    Resp: 16    Temp: 99.3 F (37.4 C)    TempSrc: Oral    SpO2: 99%    Weight:  99.8 kg   Height:  _0  (1.651 m)    General:  A&O x 3, NAD, nontoxic, pleasant/cooperative Head/Eye: No conjunctival hemorrhage, no icterus, [place]/AT, No nystagmus ENT:  No icterus,  No thrush, good dentition, no pharyngeal exudate Neck:  No masses, no lymphadenpathy, no bruits CV:  RRR, no rub, no gallop, no S3 Lung:  CTAB, good air movement, no wheeze, no rhonchi Abdomen: softLLQ tender, +BS, nondistended, no peritoneal signs Ext: No cyanosis, No rashes, No petechiae, No lymphangitis, No edema Neuro: CNII-XII intact, strength 4/5 in bilateral upper and lower extremities, no dysmetria  Labs on Admission:  Basic Metabolic Panel: Recent Labs  Lab 08/13/19 0916  NA 135  K 3.2*  CL 98    CO2 24  GLUCOSE 127*  BUN 10  CREATININE 0.76  CALCIUM 8.8*   Liver Function Tests: Recent Labs  Lab 08/13/19 0916  AST 42*  ALT 50*  ALKPHOS 78  BILITOT 1.3*  PROT 7.3  ALBUMIN 3.9   Recent Labs  Lab 08/13/19 0916  LIPASE 15   No results for input(s): AMMONIA in the last 168 hours. CBC: Recent Labs  Lab 08/13/19 0916  WBC 10.8*  NEUTROABS 8.8*  HGB 12.3  HCT 37.0  MCV 92.5  PLT 214   Coagulation Profile: No results for input(s): INR, PROTIME in the last 168 hours. Cardiac Enzymes: No results for input(s): CKTOTAL, CKMB, CKMBINDEX, TROPONINI in the last 168 hours. BNP: Invalid input(s): POCBNP CBG: No results for input(s): GLUCAP in the last 168 hours. Urine analysis:    Component Value Date/Time   COLORURINE STRAW (A) 08/13/2019 0916   APPEARANCEUR CLEAR 08/13/2019 0916   LABSPEC 1.023 08/13/2019 0916   PHURINE 6.0 08/13/2019 0916   GLUCOSEU NEGATIVE 08/13/2019 0916   HGBUR NEGATIVE 08/13/2019 0916   BILIRUBINUR NEGATIVE 08/13/2019 0916   KETONESUR NEGATIVE 08/13/2019 0916   PROTEINUR NEGATIVE 08/13/2019 0916   NITRITE NEGATIVE 08/13/2019 0916   LEUKOCYTESUR NEGATIVE 08/13/2019 0916   Sepsis Labs: _1 (procalcitonin:4,lacticidven:4) )No results found for this or any previous visit (from the past 240 hour(s)).   Radiological Exams on Admission: CT Abdomen Pelvis W Contrast  Result Date: 08/13/2019 CLINICAL DATA:  Left lower abdominal pain for 3 days with nausea. EXAM: CT ABDOMEN AND PELVIS WITH CONTRAST TECHNIQUE: Multidetector CT imaging of the abdomen and pelvis was performed using the standard protocol following bolus administration of intravenous contrast. CONTRAST:  111m OMNIPAQUE IOHEXOL 300 MG/ML  SOLN COMPARISON:  None. FINDINGS: Lower chest: Lung bases are clear. Heart size normal. No pericardial or pleural effusion. Distal esophagus is unremarkable. Hepatobiliary: Liver is unremarkable. Cholecystectomy. No biliary ductal dilatation.  Pancreas: Negative. Spleen: Negative. Adrenals/Urinary Tract: Adrenal glands and kidneys are unremarkable. Ureters are decompressed. Bladder is grossly unremarkable. Stomach/Bowel: Stomach is unremarkable. Duodenal diverticulum is incidentally noted. Small bowel, appendix and majority of the colon are unremarkable. Wall thickening, diverticula and extensive pericolonic haziness/stranding are seen in the proximal sigmoid colon. No extraluminal air or organized fluid collection. Vascular/Lymphatic: Vascular structures are unremarkable. No pathologically enlarged lymph nodes. Likely tiny reactive lymph nodes in the  sigmoid mesocolon. Reproductive: Hysterectomy.  No adnexal mass. Other: Trace pelvic free fluid. Mesenteries and peritoneum are otherwise unremarkable. Musculoskeletal: Degenerative disc disease at L5-S1. No worrisome lytic or sclerotic lesions. IMPRESSION: Sigmoid diverticulitis without rupture or abscess. Electronically Signed   By: Lorin Picket M.D.   On: 08/13/2019 10:29      Time spent:60 minutes Code Status:   FULL Family Communication:  No Family at bedside Disposition Plan: expect 1-2 day hospitalization Consults called: none DVT Prophylaxis: [place] Lovenox  Orson Eva, DO  [facility] Pager [phone]  If 7PM-7AM, please contact night-coverage [email or web address] Password [facility] 08/13/2019, 1:53 PM

## 2019-08-13 NOTE — ED Triage Notes (Signed)
Pt presents with severe abdominal pain and is guarded upon arrival. Provider aware and advised to go to ED for higher level of care

## 2019-08-14 DIAGNOSIS — E876 Hypokalemia: Secondary | ICD-10-CM | POA: Diagnosis not present

## 2019-08-14 DIAGNOSIS — I10 Essential (primary) hypertension: Secondary | ICD-10-CM | POA: Diagnosis not present

## 2019-08-14 DIAGNOSIS — R7401 Elevation of levels of liver transaminase levels: Secondary | ICD-10-CM | POA: Diagnosis not present

## 2019-08-14 DIAGNOSIS — K5792 Diverticulitis of intestine, part unspecified, without perforation or abscess without bleeding: Secondary | ICD-10-CM | POA: Diagnosis not present

## 2019-08-14 LAB — CBC
HCT: 32 % — ABNORMAL LOW (ref 36.0–46.0)
Hemoglobin: 10.2 g/dL — ABNORMAL LOW (ref 12.0–15.0)
MCH: 30.5 pg (ref 26.0–34.0)
MCHC: 31.9 g/dL (ref 30.0–36.0)
MCV: 95.8 fL (ref 80.0–100.0)
Platelets: 160 10*3/uL (ref 150–400)
RBC: 3.34 MIL/uL — ABNORMAL LOW (ref 3.87–5.11)
RDW: 13.5 % (ref 11.5–15.5)
WBC: 7.2 10*3/uL (ref 4.0–10.5)
nRBC: 0 % (ref 0.0–0.2)

## 2019-08-14 LAB — COMPREHENSIVE METABOLIC PANEL
ALT: 72 U/L — ABNORMAL HIGH (ref 0–44)
AST: 62 U/L — ABNORMAL HIGH (ref 15–41)
Albumin: 3.1 g/dL — ABNORMAL LOW (ref 3.5–5.0)
Alkaline Phosphatase: 77 U/L (ref 38–126)
Anion gap: 7 (ref 5–15)
BUN: 7 mg/dL (ref 6–20)
CO2: 26 mmol/L (ref 22–32)
Calcium: 7.9 mg/dL — ABNORMAL LOW (ref 8.9–10.3)
Chloride: 105 mmol/L (ref 98–111)
Creatinine, Ser: 0.74 mg/dL (ref 0.44–1.00)
GFR calc Af Amer: >60 mL/min (ref >60)
GFR calc non Af Amer: >60 mL/min (ref >60)
Glucose, Bld: 120 mg/dL — ABNORMAL HIGH (ref 70–99)
Potassium: 4 mmol/L (ref 3.5–5.1)
Sodium: 138 mmol/L (ref 135–145)
Total Bilirubin: 1 mg/dL (ref 0.3–1.2)
Total Protein: 5.8 g/dL — ABNORMAL LOW (ref 6.5–8.1)

## 2019-08-14 LAB — SARS CORONAVIRUS 2 (TAT 6-24 HRS): SARS Coronavirus 2: NEGATIVE

## 2019-08-14 LAB — MAGNESIUM: Magnesium: 1.9 mg/dL (ref 1.7–2.4)

## 2019-08-14 MED ORDER — CIPROFLOXACIN HCL 500 MG PO TABS: 500.0000 mg | ORAL_TABLET | Freq: Two times a day (BID) | ORAL | 0 refills | Status: DC

## 2019-08-14 MED ORDER — SENNA 8.6 MG PO TABS
2.0000 | ORAL_TABLET | Freq: Every day | ORAL | Status: DC
  Administered 2019-08-14: 17.2 mg via ORAL
  Filled 2019-08-14: qty 2

## 2019-08-14 MED ORDER — METRONIDAZOLE 500 MG PO TABS: 500.0000 mg | ORAL_TABLET | Freq: Three times a day (TID) | ORAL | 0 refills | Status: DC

## 2019-08-14 MED ORDER — HYDROCODONE-ACETAMINOPHEN 5-325 MG PO TABS: 1.0000 | ORAL_TABLET | ORAL | 0 refills | Status: DC | PRN

## 2019-08-14 MED ORDER — HYDROCODONE-ACETAMINOPHEN 5-325 MG PO TABS: 1.0000 | ORAL_TABLET | ORAL | Status: DC | PRN

## 2019-08-14 MED ORDER — METRONIDAZOLE 500 MG PO TABS: 500.0000 mg | ORAL_TABLET | Freq: Three times a day (TID) | ORAL | Status: DC

## 2019-08-14 MED ORDER — POLYETHYLENE GLYCOL 3350 17 G PO PACK
17.0000 g | PACK | Freq: Every day | ORAL | Status: DC
  Administered 2019-08-14: 17 g via ORAL
  Filled 2019-08-14: qty 1

## 2019-08-14 MED ORDER — CIPROFLOXACIN HCL 250 MG PO TABS: 500.0000 mg | ORAL_TABLET | Freq: Two times a day (BID) | ORAL | Status: DC

## 2019-08-14 MED ORDER — KETOROLAC TROMETHAMINE 15 MG/ML IJ SOLN
15.0000 mg | Freq: Four times a day (QID) | INTRAMUSCULAR | Status: DC | PRN
  Administered 2019-08-14 (×2): 15 mg via INTRAVENOUS
  Filled 2019-08-14 (×2): qty 1

## 2019-08-14 NOTE — Discharge Instructions (Signed)

## 2019-08-14 NOTE — Discharge Summary (Signed)
Physician Discharge Summary  Theresa [name] RWE:[id number] DOB: Nov 02, 1970 DOA: 08/13/2019  PCP: Kathyrn Drown, MD  Admit date: 08/13/2019 Discharge date: 08/14/2019  Admitted From: Home Disposition:  Home   Recommendations for Outpatient Follow-up:  1. Follow up with PCP in 1-2 weeks 2. Please obtain BMP/CBC in one week     Discharge Condition: Stable CODE STATUS: FULL Diet recommendation: Heart Healthy    Brief/Interim Summary: 49 y.o. female with medical history of hypertension, anxiety, and GERD presenting with 2-day history of abdominal pain that began on 08/11/2019.  She stated that abdominal pain had worsened over the last 24 hours and was located in the left lower quadrant.  She had some nausea without emesis.  She denied any fevers, chills, chest pain, shortness breath, cough, hemoptysis, dysuria, hematuria, diarrhea, hematochezia, melena.  She stated that approximately 4 years ago she had a similar episode and was diagnosed with diverticulitis.  She did not require hospitalization at that time.  She denies any new medications.  She denies any recent travels or eating exotic foods.  She does not drink or use any illicit drugs.  Last bowel movement was on 08/11/2019. In the emergency department, the patient was afebrile and hemodynamically stable albeit with a temperature of 99.3 F.  Oxygen saturation was 99% on room air.  WBC was 10.8.  Potassium was 3.2.  Otherwise BMP, and CBC were unremarkable.  AST 42, ALT 50, alk phosphatase 13, total bilirubin 1.3, lipase 15.  CT of the abdomen and pelvis showed acute sigmoid diverticulitis without rupture or abscess.  The patient was started on IV fluids and intravenous antibiotics.  Discharge Diagnoses:  Acute diverticulitis without abscess or rupture -overall improving with IVF, IV opioids, full liquids -Continue judicious IV opioids -IV ceftriaxone and metronidazole -IV fluids -Full liquid diet>>>soft diet which patient tolerated  -UA negative for pyuria -d/c home with cipro and flagyl x 9 more days -home with norco 5/325 mg prn pain, #10  Essential hypertension -Holding lisinopril/HCTZ secondary to soft blood pressure -continue to hold anti-HTN meds as BP remains well controlled -f/u PCP for recheck BP  GERD -Continue PPI  Anxiety -Continue home dose Valium  Transaminasemia -Hepatitis B surface antigen--neg -Hepatitis C antibody--neg -Right upper quadrant ultrasound--unremarkable right upper quadrant ultrasound. -repeat LFTs in 2 weeks after pt recovers from acute illness     Discharge Instructions   Allergies as of 08/14/2019   No Known Allergies     Medication List    STOP taking these medications   azithromycin 250 MG tablet Commonly known as: Zithromax Z-Pak   cyclobenzaprine 10 MG tablet Commonly known as: FLEXERIL   lisinopril-hydrochlorothiazide 20-25 MG tablet Commonly known as: ZESTORETIC   naproxen 500 MG tablet Commonly known as: NAPROSYN     TAKE these medications   ciprofloxacin 500 MG tablet Commonly known as: CIPRO Take 1 tablet (500 mg total) by mouth 2 (two) times daily.   dexlansoprazole 60 MG capsule Commonly known as: DEXILANT Take 1 capsule (60 mg total) by mouth daily. Prn acid reflux What changed: additional instructions   diazepam 5 MG tablet Commonly known as: VALIUM TAKE 1/2 TO 1 TABLET BY MOUTH ONCE DAILY AS NEEDED FOR ANXIETY   estradiol 1 MG tablet Commonly known as: ESTRACE Take 1 tablet (1 mg total) by mouth daily.   HYDROcodone-acetaminophen 5-325 MG tablet Commonly known as: NORCO/VICODIN Take 1 tablet by mouth every 4 (four) hours as needed for moderate pain.   Magnesium 400 MG Caps Take  1 tablet by mouth daily.   metroNIDAZOLE 500 MG tablet Commonly known as: FLAGYL Take 1 tablet (500 mg total) by mouth every 8 (eight) hours.   OMEGA 3-6-9 COMPLEX PO Take by mouth.   PROBIOTIC-10 PO Take by mouth.   rizatriptan 10 MG  disintegrating tablet Commonly known as: MAXALT-MLT TAKE 1 TABLET AS NEEDED FOR MIGRAIN, MAY REPEAT IN 2 HOURS MAX 2/24 HOURS       No Known Allergies  Consultations:  none   Procedures/Studies: CT Abdomen Pelvis W Contrast  Result Date: 08/13/2019 CLINICAL DATA:  Left lower abdominal pain for 3 days with nausea. EXAM: CT ABDOMEN AND PELVIS WITH CONTRAST TECHNIQUE: Multidetector CT imaging of the abdomen and pelvis was performed using the standard protocol following bolus administration of intravenous contrast. CONTRAST:  163m OMNIPAQUE IOHEXOL 300 MG/ML  SOLN COMPARISON:  None. FINDINGS: Lower chest: Lung bases are clear. Heart size normal. No pericardial or pleural effusion. Distal esophagus is unremarkable. Hepatobiliary: Liver is unremarkable. Cholecystectomy. No biliary ductal dilatation. Pancreas: Negative. Spleen: Negative. Adrenals/Urinary Tract: Adrenal glands and kidneys are unremarkable. Ureters are decompressed. Bladder is grossly unremarkable. Stomach/Bowel: Stomach is unremarkable. Duodenal diverticulum is incidentally noted. Small bowel, appendix and majority of the colon are unremarkable. Wall thickening, diverticula and extensive pericolonic haziness/stranding are seen in the proximal sigmoid colon. No extraluminal air or organized fluid collection. Vascular/Lymphatic: Vascular structures are unremarkable. No pathologically enlarged lymph nodes. Likely tiny reactive lymph nodes in the sigmoid mesocolon. Reproductive: Hysterectomy.  No adnexal mass. Other: Trace pelvic free fluid. Mesenteries and peritoneum are otherwise unremarkable. Musculoskeletal: Degenerative disc disease at L5-S1. No worrisome lytic or sclerotic lesions. IMPRESSION: Sigmoid diverticulitis without rupture or abscess. Electronically Signed   By: MLorin PicketM.D.   On: 08/13/2019 10:29   [place]domen Limited RUQ  Result Date: 08/13/2019 CLINICAL DATA:  Abdominal pain, elevated transaminases EXAM: ULTRASOUND  ABDOMEN LIMITED RIGHT UPPER QUADRANT COMPARISON:  CT abdomen pelvis 08/13/2019 FINDINGS: Gallbladder: Prior cholecystectomy Common bile duct: Diameter: 6 mm, upper limits normal. Liver: Rib shadowing partially obscures portions of the liver. No focal liver lesion is seen. Uniform hepatic echogenicity. Portal vein is patent on color Doppler imaging with normal direction of blood flow towards the liver. Other: None. IMPRESSION: Prior cholecystectomy. Otherwise unremarkable right upper quadrant ultrasound. Electronically Signed   By: PLovena LeM.D.   On: 08/13/2019 17:03        Discharge Exam: Vitals:   08/14/19 0200 08/14/19 0600  BP: 100/75 98/60  Pulse: 72 74  Resp: 20 18  Temp: 98.6 F (37 C) 98.3 F (36.8 C)  SpO2: 95% 98%   Vitals:   08/13/19 1809 08/13/19 2210 08/14/19 0200 08/14/19 0600  BP: 98/67 99/70 100/75 98/60  Pulse: 79 77 72 74  Resp: '20 20 20 18  ' Temp: 98.4 F (36.9 C) 98.9 F (37.2 C) 98.6 F (37 C) 98.3 F (36.8 C)  TempSrc: Oral Oral  Oral  SpO2: 98% 94% 95% 98%  Weight:      Height:        General: Pt is alert, awake, not in acute distress Cardiovascular: RRR, S1/S2 +, no rubs, no gallops Respiratory: CTA bilaterally, no wheezing, no rhonchi Abdominal: Soft, LLQ pain, no guarding, ND, bowel sounds + Extremities: no edema, no cyanosis   The results of significant diagnostics from this hospitalization (including imaging, microbiology, ancillary and laboratory) are listed below for reference.    Significant Diagnostic Studies: CT Abdomen Pelvis W Contrast  Result Date: 08/13/2019  CLINICAL DATA:  Left lower abdominal pain for 3 days with nausea. EXAM: CT ABDOMEN AND PELVIS WITH CONTRAST TECHNIQUE: Multidetector CT imaging of the abdomen and pelvis was performed using the standard protocol following bolus administration of intravenous contrast. CONTRAST:  120m OMNIPAQUE IOHEXOL 300 MG/ML  SOLN COMPARISON:  None. FINDINGS: Lower chest: Lung bases are  clear. Heart size normal. No pericardial or pleural effusion. Distal esophagus is unremarkable. Hepatobiliary: Liver is unremarkable. Cholecystectomy. No biliary ductal dilatation. Pancreas: Negative. Spleen: Negative. Adrenals/Urinary Tract: Adrenal glands and kidneys are unremarkable. Ureters are decompressed. Bladder is grossly unremarkable. Stomach/Bowel: Stomach is unremarkable. Duodenal diverticulum is incidentally noted. Small bowel, appendix and majority of the colon are unremarkable. Wall thickening, diverticula and extensive pericolonic haziness/stranding are seen in the proximal sigmoid colon. No extraluminal air or organized fluid collection. Vascular/Lymphatic: Vascular structures are unremarkable. No pathologically enlarged lymph nodes. Likely tiny reactive lymph nodes in the sigmoid mesocolon. Reproductive: Hysterectomy.  No adnexal mass. Other: Trace pelvic free fluid. Mesenteries and peritoneum are otherwise unremarkable. Musculoskeletal: Degenerative disc disease at L5-S1. No worrisome lytic or sclerotic lesions. IMPRESSION: Sigmoid diverticulitis without rupture or abscess. Electronically Signed   By: MLorin PicketM.D.   On: 08/13/2019 10:29   [place]domen Limited RUQ  Result Date: 08/13/2019 CLINICAL DATA:  Abdominal pain, elevated transaminases EXAM: ULTRASOUND ABDOMEN LIMITED RIGHT UPPER QUADRANT COMPARISON:  CT abdomen pelvis 08/13/2019 FINDINGS: Gallbladder: Prior cholecystectomy Common bile duct: Diameter: 6 mm, upper limits normal. Liver: Rib shadowing partially obscures portions of the liver. No focal liver lesion is seen. Uniform hepatic echogenicity. Portal vein is patent on color Doppler imaging with normal direction of blood flow towards the liver. Other: None. IMPRESSION: Prior cholecystectomy. Otherwise unremarkable right upper quadrant ultrasound. Electronically Signed   By: PLovena LeM.D.   On: 08/13/2019 17:03     Microbiology: Recent Results (from the past 240  hour(s))  SARS CORONAVIRUS 2 (Aime Carreras 6-24 HRS) Nasopharyngeal Nasopharyngeal Swab     Status: None   Collection Time: 08/13/19  1:40 PM   Specimen: Nasopharyngeal Swab  Result Value Ref Range Status   SARS Coronavirus 2 NEGATIVE NEGATIVE Final    Comment: (NOTE) SARS-CoV-2 target nucleic acids are NOT DETECTED. The SARS-CoV-2 RNA is generally detectable in upper and lower respiratory specimens during the acute phase of infection. Negative results do not preclude SARS-CoV-2 infection, do not rule out co-infections with other pathogens, and should not be used as the sole basis for treatment or other patient management decisions. Negative results must be combined with clinical observations, patient history, and epidemiological information. The expected result is Negative. Fact Sheet for Patients: hSugarRoll.beFact Sheet for Healthcare Providers: hhttps://www.woods-mathews.com/This test is not yet approved or cleared by the [place] and  has been authorized for detection and/or diagnosis of SARS-CoV-2 by FDA under an Emergency Use Authorization (EUA). This EUA will remain  in effect (meaning this test can be used) for the duration of the COVID-19 declaration under Section 56 4(b)(1) of the Act, 21 U.S.C. section 360bbb-3(b)(1), unless the authorization is terminated or revoked sooner. Performed at [facility] [id number] [place] [address]     Labs: Basic Metabolic Panel: Recent Labs  Lab 08/13/19 0916 08/14/19 0415  NA 135 138  K 3.2* 4.0  CL 98 105  CO2 24 26  GLUCOSE 127* 120*  BUN 10 7  CREATININE 0.76 0.74  CALCIUM 8.8* 7.9*  MG  --  1.9   Liver  Function Tests: Recent Labs  Lab 08/13/19 0916 08/14/19 0415  AST 42* 62*  ALT 50* 72*  ALKPHOS 78 77  BILITOT 1.3* 1.0  PROT 7.3 5.8*  ALBUMIN 3.9 3.1*   Recent Labs  Lab 08/13/19 0916  LIPASE 15   No results for input(s): AMMONIA in the last  168 hours. CBC: Recent Labs  Lab 08/13/19 0916 08/14/19 0415  WBC 10.8* 7.2  NEUTROABS 8.8*  --   HGB 12.3 10.2*  HCT 37.0 32.0*  MCV 92.5 95.8  PLT 214 160   Cardiac Enzymes: No results for input(s): CKTOTAL, CKMB, CKMBINDEX, TROPONINI in the last 168 hours. BNP: Invalid input(s): POCBNP CBG: No results for input(s): GLUCAP in the last 168 hours.  Time coordinating discharge:  36 minutes  Signed:  Orson Eva, DO [facility] Pager: [phone] 08/14/2019, 1:46 PM

## 2019-08-14 NOTE — H&P (Signed)
History and Physical  Theresa [name] FTD:[id number] DOB: 16-Apr-1971 DOA: 08/13/2019   PCP: Kathyrn Drown, MD   Patient coming from: Home  Chief Complaint: abdominal pain  HPI:  Theresa [name] is a 49 y.o. female with medical history of hypertension, anxiety, and GERD presenting with 2-day history of abdominal pain that began on 08/11/2019.  She stated that abdominal pain had worsened over the last 24 hours and was located in the left lower quadrant.  She had some nausea without emesis.  She denied any fevers, chills, chest pain, shortness breath, cough, hemoptysis, dysuria, hematuria, diarrhea, hematochezia, melena.  She stated that approximately 4 years ago she had a similar episode and was diagnosed with diverticulitis.  She did not require hospitalization at that time.  She denies any new medications.  She denies any recent travels or eating exotic foods.  She does not drink or use any illicit drugs.  Last bowel movement was on 08/11/2019. In the emergency department, the patient was afebrile and hemodynamically stable albeit with a temperature of 99.3 F.  Oxygen saturation was 99% on room air.  WBC was 10.8.  Potassium was 3.2.  Otherwise BMP, and CBC were unremarkable.  AST 42, ALT 50, alk phosphatase 13, total bilirubin 1.3, lipase 15.  CT of the abdomen and pelvis showed acute sigmoid diverticulitis without rupture or abscess.  The patient was started on IV fluids and intravenous antibiotics.  Assessment/Plan: Acute diverticulitis without abscess or rupture -Patient still had uncontrolled pain in the emergency department despite IV opioids -Continue judicious IV opioids -IV ceftriaxone and metronidazole -IV fluids -Full liquid diet -UA negative for pyuria  Essential hypertension -Holding lisinopril/HCTZ secondary to soft blood pressure  GERD -Continue PPI  Anxiety -Continue home dose Valium  Transaminasemia -Hepatitis B surface antigen -Hepatitis C antibody -Right  upper quadrant ultrasound         Past Medical History:  Diagnosis Date  . Anxiety   . Depression   . Fibromyalgia   . GERD (gastroesophageal reflux disease)   . Headache(784.0)   . Heart murmur   . IFG (impaired fasting glucose)   . Obsessive-compulsive disorder   . Osteoarthritis   . Seizures ([place])    last seizure at age 61-stressed induced seizure.   Past Surgical History:  Procedure Laterality Date  . ABDOMINAL HYSTERECTOMY     still has right ovary  . BIOPSY N/A 03/05/2014   Procedure: DUODENAL AND GASTRIC BIOPSY;  Surgeon: Danie Binder, MD;  Location: [facility];  Service: Endoscopy;  Laterality: N/A;  . bladder sling X 3    . CHOLECYSTECTOMY    . COLONOSCOPY  04/24/2012   CWC:BJSEGBT polyp measuring 5 mm in size was found in the proximal transverse colon; polypectomy was performed with cold forceps Pedunculated polyp measuring 1.1 cm in size was found in the sigmoid colon; polypectomy was performed using snare cautery Moderate diverticulosis was noted in the descending colon and sigmoid colon small internal hemorrhoids. next tcs 04/2017  . ESOPHAGOGASTRODUODENOSCOPY N/A 02/18/2014   Procedure: ESOPHAGOGASTRODUODENOSCOPY (EGD);  Surgeon: Danie Binder, MD;  Location: [facility];  Service: Endoscopy;  Laterality: N/A;  115  . ESOPHAGOGASTRODUODENOSCOPY (EGD) WITH PROPOFOL N/A 03/05/2014   SLF: 1. dyspepsia due to constipation, gastritis, and GERD 2. Multiple Gastric polyps 3. Moderate non-erosive gastritis 4. Diverticulum in the 2nd part of the duodenum  . TUBAL LIGATION     Social History:  reports that she has never smoked. She  has never used smokeless tobacco. She reports current alcohol use. She reports that she does not use drugs.   Family History  Problem Relation Age of Onset  . Alcohol abuse Mother   . Depression Mother   . Anxiety disorder Mother   . Paranoid behavior Mother   . Hypertension Mother   . Alcohol abuse Father   . Hypertension Father     . Physical abuse Brother   . Colon cancer Neg Hx   . ADD / ADHD Neg Hx   . Bipolar disorder Neg Hx   . Dementia Neg Hx   . OCD Neg Hx   . Drug abuse Neg Hx   . Schizophrenia Neg Hx   . Seizures Neg Hx   . Sexual abuse Neg Hx      No Known Allergies   Prior to Admission medications   Medication Sig Start Date End Date Taking? Authorizing Provider  dexlansoprazole (DEXILANT) 60 MG capsule Take 1 capsule (60 mg total) by mouth daily. Prn acid reflux Patient taking differently: Take 60 mg by mouth daily.  05/24/19  Yes Pearson Forster C, NP  diazepam (VALIUM) 5 MG tablet TAKE 1/2 TO 1 TABLET BY MOUTH ONCE DAILY AS NEEDED FOR ANXIETY 05/24/19  Yes Nilda Simmer, NP  estradiol (ESTRACE) 1 MG tablet Take 1 tablet (1 mg total) by mouth daily. 07/13/19 07/12/20 Yes Derrek Monaco A, NP  lisinopril-hydrochlorothiazide (ZESTORETIC) 20-25 MG tablet TAKE 1 TABLET BY MOUTH ONCE DAILY. 05/24/19  Yes Nilda Simmer, NP  Magnesium 400 MG CAPS Take 1 tablet by mouth daily.    Yes [provider]  Omega 3-6-9 Fatty Acids (OMEGA 3-6-9 COMPLEX PO) Take by mouth.   Yes [provider]  Probiotic Product (PROBIOTIC-10 PO) Take by mouth.   Yes [provider]  rizatriptan (MAXALT-MLT) 10 MG disintegrating tablet TAKE 1 TABLET AS NEEDED FOR MIGRAIN, MAY REPEAT IN 2 HOURS MAX 2/24 HOURS 03/19/19  Yes Luking, Elayne Snare, MD  azithromycin (ZITHROMAX Z-PAK) 250 MG tablet Take 2 tablets (500 mg) on  Day 1,  followed by 1 tablet (250 mg) once daily on Days 2 through 5. 08/10/19   Nilda Simmer, NP  cyclobenzaprine (FLEXERIL) 10 MG tablet Take 1 tablet (10 mg total) by mouth at bedtime. Patient not taking: Reported on 08/13/2019 03/28/19   Wurst, [place], PA-C  naproxen (NAPROSYN) 500 MG tablet Take 1 tablet (500 mg total) by mouth 2 (two) times daily. Patient not taking: Reported on 08/13/2019 03/28/19   Stacey Drain, [place], PA-C  buPROPion (WELLBUTRIN XL) 150 MG 24 hr tablet Take  one tab qam Patient not taking: Reported on 03/19/2019 01/12/19 03/28/19  Nilda Simmer, NP    Review of Systems:  Constitutional:  No weight loss, night sweats, Fevers, chills, fatigue.  Head&Eyes: No headache.  No vision loss.  No eye pain or scotoma ENT:  No Difficulty swallowing,Tooth/dental problems,Sore throat,  No ear ache, post nasal drip,  Cardio-vascular:  No chest pain, Orthopnea, PND, swelling in lower extremities,  dizziness, palpitations  GI:  No  diarrhea, loss of appetite, hematochezia, melena, heartburn, indigestion, Resp:  No shortness of breath with exertion or at rest. No cough. No coughing up of blood .No wheezing.No chest wall deformity  Skin:  no rash or lesions.  GU:  no dysuria, change in color of urine, no urgency or frequency. No flank pain.  Musculoskeletal:  No joint pain or swelling. No decreased range of motion. No back pain.  Psych:  No change in mood or affect. No depression or anxiety. Neurologic: No headache, no dysesthesia, no focal weakness, no vision loss. No syncope  Physical Exam: Vitals:   08/13/19 1809 08/13/19 2210 08/14/19 0200 08/14/19 0600  BP: 98/67 99/70 100/75 98/60  Pulse: 79 77 72 74  Resp: '20 20 20 18  ' Temp: 98.4 F (36.9 C) 98.9 F (37.2 C) 98.6 F (37 C) 98.3 F (36.8 C)  TempSrc: Oral Oral  Oral  SpO2: 98% 94% 95% 98%  Weight:      Height:       General:  A&O x 3, NAD, nontoxic, pleasant/cooperative Head/Eye: No conjunctival hemorrhage, no icterus, [place]/AT, No nystagmus ENT:  No icterus,  No thrush, good dentition, no pharyngeal exudate Neck:  No masses, no lymphadenpathy, no bruits CV:  RRR, no rub, no gallop, no S3 Lung:  CTAB, good air movement, no wheeze, no rhonchi Abdomen: soft/LLQ, +BS, nondistended, no peritoneal signs Ext: No cyanosis, No rashes, No petechiae, No lymphangitis, No edema Neuro: CNII-XII intact, strength 4/5 in bilateral upper and lower extremities, no dysmetria  Labs on Admission:   Basic Metabolic Panel: Recent Labs  Lab 08/13/19 0916 08/14/19 0415  NA 135 138  K 3.2* 4.0  CL 98 105  CO2 24 26  GLUCOSE 127* 120*  BUN 10 7  CREATININE 0.76 0.74  CALCIUM 8.8* 7.9*  MG  --  1.9   Liver Function Tests: Recent Labs  Lab 08/13/19 0916 08/14/19 0415  AST 42* 62*  ALT 50* 72*  ALKPHOS 78 77  BILITOT 1.3* 1.0  PROT 7.3 5.8*  ALBUMIN 3.9 3.1*   Recent Labs  Lab 08/13/19 0916  LIPASE 15   No results for input(s): AMMONIA in the last 168 hours. CBC: Recent Labs  Lab 08/13/19 0916 08/14/19 0415  WBC 10.8* 7.2  NEUTROABS 8.8*  --   HGB 12.3 10.2*  HCT 37.0 32.0*  MCV 92.5 95.8  PLT 214 160   Coagulation Profile: No results for input(s): INR, PROTIME in the last 168 hours. Cardiac Enzymes: No results for input(s): CKTOTAL, CKMB, CKMBINDEX, TROPONINI in the last 168 hours. BNP: Invalid input(s): POCBNP CBG: No results for input(s): GLUCAP in the last 168 hours. Urine analysis:    Component Value Date/Time   COLORURINE STRAW (A) 08/13/2019 0916   APPEARANCEUR CLEAR 08/13/2019 0916   LABSPEC 1.023 08/13/2019 0916   PHURINE 6.0 08/13/2019 0916   GLUCOSEU NEGATIVE 08/13/2019 0916   HGBUR NEGATIVE 08/13/2019 0916   BILIRUBINUR NEGATIVE 08/13/2019 0916   KETONESUR NEGATIVE 08/13/2019 0916   PROTEINUR NEGATIVE 08/13/2019 0916   NITRITE NEGATIVE 08/13/2019 0916   LEUKOCYTESUR NEGATIVE 08/13/2019 0916   Sepsis Labs: '@LABRCNTIP' (procalcitonin:4,lacticidven:4) ) Recent Results (from the past 240 hour(s))  SARS CORONAVIRUS 2 (Falon Flinchum 6-24 HRS) Nasopharyngeal Nasopharyngeal Swab     Status: None   Collection Time: 08/13/19  1:40 PM   Specimen: Nasopharyngeal Swab  Result Value Ref Range Status   SARS Coronavirus 2 NEGATIVE NEGATIVE Final    Comment: (NOTE) SARS-CoV-2 target nucleic acids are NOT DETECTED. The SARS-CoV-2 RNA is generally detectable in upper and lower respiratory specimens during the acute phase of infection. Negative results do  not preclude SARS-CoV-2 infection, do not rule out co-infections with other pathogens, and should not be used as the sole basis for treatment or other patient management decisions. Negative results must be combined with clinical observations, patient history, and epidemiological information. The expected result is Negative. Fact Sheet for Patients: SugarRoll.be Fact  Sheet for Healthcare Providers: https://www.woods-mathews.com/ This test is not yet approved or cleared by the [place] FDA and  has been authorized for detection and/or diagnosis of SARS-CoV-2 by FDA under an Emergency Use Authorization (EUA). This EUA will remain  in effect (meaning this test can be used) for the duration of the COVID-19 declaration under Section 56 4(b)(1) of the Act, 21 U.S.C. section 360bbb-3(b)(1), unless the authorization is terminated or revoked sooner. Performed at [facility], [place] [address] [place], [place] [address]      Radiological Exams on Admission: CT Abdomen Pelvis W Contrast  Result Date: 08/13/2019 CLINICAL DATA:  Left lower abdominal pain for 3 days with nausea. EXAM: CT ABDOMEN AND PELVIS WITH CONTRAST TECHNIQUE: Multidetector CT imaging of the abdomen and pelvis was performed using the standard protocol following bolus administration of intravenous contrast. CONTRAST:  126m OMNIPAQUE IOHEXOL 300 MG/ML  SOLN COMPARISON:  None. FINDINGS: Lower chest: Lung bases are clear. Heart size normal. No pericardial or pleural effusion. Distal esophagus is unremarkable. Hepatobiliary: Liver is unremarkable. Cholecystectomy. No biliary ductal dilatation. Pancreas: Negative. Spleen: Negative. Adrenals/Urinary Tract: Adrenal glands and kidneys are unremarkable. Ureters are decompressed. Bladder is grossly unremarkable. Stomach/Bowel: Stomach is unremarkable. Duodenal diverticulum is incidentally noted. Small bowel, appendix and majority of the colon are  unremarkable. Wall thickening, diverticula and extensive pericolonic haziness/stranding are seen in the proximal sigmoid colon. No extraluminal air or organized fluid collection. Vascular/Lymphatic: Vascular structures are unremarkable. No pathologically enlarged lymph nodes. Likely tiny reactive lymph nodes in the sigmoid mesocolon. Reproductive: Hysterectomy.  No adnexal mass. Other: Trace pelvic free fluid. Mesenteries and peritoneum are otherwise unremarkable. Musculoskeletal: Degenerative disc disease at L5-S1. No worrisome lytic or sclerotic lesions. IMPRESSION: Sigmoid diverticulitis without rupture or abscess. Electronically Signed   By: MLorin PicketM.D.   On: 08/13/2019 10:29   UKoreaAbdomen Limited RUQ  Result Date: 08/13/2019 CLINICAL DATA:  Abdominal pain, elevated transaminases EXAM: ULTRASOUND ABDOMEN LIMITED RIGHT UPPER QUADRANT COMPARISON:  CT abdomen pelvis 08/13/2019 FINDINGS: Gallbladder: Prior cholecystectomy Common bile duct: Diameter: 6 mm, upper limits normal. Liver: Rib shadowing partially obscures portions of the liver. No focal liver lesion is seen. Uniform hepatic echogenicity. Portal vein is patent on color Doppler imaging with normal direction of blood flow towards the liver. Other: None. IMPRESSION: Prior cholecystectomy. Otherwise unremarkable right upper quadrant ultrasound. Electronically Signed   By: PLovena LeM.D.   On: 08/13/2019 17:03        Time spent:60 minutes Code Status:   FULL Family Communication:  No Family at bedside Disposition Plan: expect 1-2 day hospitalization Consults called: none DVT Prophylaxis: [place] Lovenox  DOrson Eva DO  [facility] Pager [phone] If 7PM-7AM, please contact night-coverage [email or web address] Password [email or web address]4/13/2021, 10:59 AM

## 2019-08-15 ENCOUNTER — Telehealth: Payer: Self-pay | Admitting: *Deleted

## 2019-08-15 NOTE — Telephone Encounter (Signed)
Patient called in today stating she was released from the hospital yesterday for diverticulitis. Patient concerned she has not had BM since Saturday and has not urinated since last night. States she is drinking and taking miralax. Hospital doc stopped bp meds. Per Dr. Richardson Landry patient was advised not to take bp medication but increase fluid intake and take miralax twice per day and see Dr. Nicki Reaper tomorrow (patient scheduled). Very unlikely patient will go into retention form diverticulitis but call with further issues.

## 2019-08-16 ENCOUNTER — Telehealth: Payer: Self-pay | Admitting: Family Medicine

## 2019-08-16 ENCOUNTER — Other Ambulatory Visit: Payer: Self-pay | Admitting: Family Medicine

## 2019-08-16 ENCOUNTER — Other Ambulatory Visit: Payer: Self-pay

## 2019-08-16 ENCOUNTER — Ambulatory Visit: Payer: BC Managed Care – PPO | Admitting: Family Medicine

## 2019-08-16 VITALS — BP 138/80 | Temp 98.0°F | Wt 218.4 lb

## 2019-08-16 DIAGNOSIS — K219 Gastro-esophageal reflux disease without esophagitis: Secondary | ICD-10-CM

## 2019-08-16 DIAGNOSIS — R7401 Elevation of levels of liver transaminase levels: Secondary | ICD-10-CM

## 2019-08-16 DIAGNOSIS — E876 Hypokalemia: Secondary | ICD-10-CM

## 2019-08-16 DIAGNOSIS — R5383 Other fatigue: Secondary | ICD-10-CM

## 2019-08-16 DIAGNOSIS — I10 Essential (primary) hypertension: Secondary | ICD-10-CM | POA: Diagnosis not present

## 2019-08-16 DIAGNOSIS — Z79899 Other long term (current) drug therapy: Secondary | ICD-10-CM

## 2019-08-16 DIAGNOSIS — K5792 Diverticulitis of intestine, part unspecified, without perforation or abscess without bleeding: Secondary | ICD-10-CM

## 2019-08-16 DIAGNOSIS — R739 Hyperglycemia, unspecified: Secondary | ICD-10-CM

## 2019-08-16 MED ORDER — SUCRALFATE 1 GM/10ML PO SUSP: ORAL | 0 refills | Status: DC

## 2019-08-16 MED ORDER — HYDROCHLOROTHIAZIDE 25 MG PO TABS: ORAL_TABLET | ORAL | 2 refills | Status: DC

## 2019-08-16 MED ORDER — SUCRALFATE 1 GM/10ML PO SUSP: ORAL | 1 refills | Status: DC

## 2019-08-16 MED ORDER — POTASSIUM CHLORIDE ER 10 MEQ PO TBCR: 10.0000 meq | EXTENDED_RELEASE_TABLET | Freq: Two times a day (BID) | ORAL | 2 refills | Status: DC

## 2019-08-16 MED ORDER — ONDANSETRON HCL 8 MG PO TABS: ORAL_TABLET | ORAL | 2 refills | Status: DC

## 2019-08-16 NOTE — Progress Notes (Signed)
   Subjective:    Patient ID: Theresa [name], female    DOB: Nov 06, 1970, 49 y.o.   MRN: [id number]  HPI Patient comes in today for follow up on diverticulitis. Patient was in the hospital until 2 days ago. Yesterday patient was unable to urinate or have a bm for about half of the day. Patient was advised to stop bp medication in the hospital but took that yesterday along with miralax to help her use the bathroom. Hospital note was reviewed in detail She is still having pain in her LLQ and nauseous and unable to eat.   Review of Systems  Constitutional: Negative for activity change, appetite change and fatigue.  HENT: Negative for congestion and rhinorrhea.   Respiratory: Negative for cough and shortness of breath.   Cardiovascular: Negative for chest pain and leg swelling.  Gastrointestinal: Positive for abdominal pain and nausea. Negative for diarrhea.  Endocrine: Negative for polydipsia and polyphagia.  Skin: Negative for color change.  Neurological: Negative for dizziness and weakness.  Psychiatric/Behavioral: Negative for behavioral problems and confusion.       Objective:   Physical Exam Vitals reviewed.  Constitutional:      General: She is not in acute distress. HENT:     Head: Normocephalic and atraumatic.  Eyes:     General:        Right eye: No discharge.        Left eye: No discharge.  Neck:     Trachea: No tracheal deviation.  Cardiovascular:     Rate and Rhythm: Normal rate and regular rhythm.     Heart sounds: Normal heart sounds. No murmur.  Pulmonary:     Effort: Pulmonary effort is normal. No respiratory distress.     Breath sounds: Normal breath sounds.  Lymphadenopathy:     Cervical: No cervical adenopathy.  Skin:    General: Skin is warm and dry.  Neurological:     Mental Status: She is alert.     Coordination: Coordination normal.  Psychiatric:        Behavior: Behavior normal.    Left lower quadrant abdominal pain Epigastric abdominal  pain       Assessment & Plan:  1. Acute diverticulitis Elizebeth Koller out the antibiotics.  Warning signs discussed follow-up if problems - Lipase  2. Gastroesophageal reflux disease without esophagitis Gastritis recommend Carafate if ongoing troubles referral for EGD  3. Elevated transaminase level Elevated liver enzymes repeat these again in 1 week if still elevated may need further work-up  4. Essential hypertension, benign Blood pressure good control continue current measures - Basic metabolic panel  5. Other fatigue Fatigue related into the diverticulitis continue current measures - CBC with Differential/Platelet - Lipase  6. Hyperglycemia Will monitor on follow-up labs  7. Hypokalemia Monitor follow-up labs - Basic metabolic panel - CBC with Differential/Platelet - Lipase  8. High risk medication use Recheck liver enzymes - Hepatic function panel

## 2019-08-16 NOTE — Telephone Encounter (Signed)
Joelene Millin calling from [facility] regarding patients Carafate 1 gm/10 mg suspension. Sig states Take 3 times per day. Please clarify. Thank you

## 2019-08-16 NOTE — Telephone Encounter (Signed)
I resent the prescription 10 mL taken 3 times daily

## 2019-08-17 ENCOUNTER — Telehealth: Payer: Self-pay | Admitting: Family Medicine

## 2019-08-17 MED ORDER — SUCRALFATE 1 G PO TABS: ORAL_TABLET | ORAL | 0 refills | Status: DC

## 2019-08-17 NOTE — Addendum Note (Signed)
Addended by: Vicente Males on: 08/17/2019 04:32 PM   Modules accepted: Orders

## 2019-08-17 NOTE — Telephone Encounter (Signed)
PA from pharmacy regarding pt Sucralfate 1 gm/10 ml suspension. Insurance does not cover suspension but will cover tablets. Can we change to tablets? Please advise. Thank you.

## 2019-08-17 NOTE — Telephone Encounter (Signed)
Please change to tablets, 1 tablet taken 3 times daily as needed #90 Have pharmacy explained to the patient the reason She can always crush up the tablets and mix it into liquid

## 2019-08-17 NOTE — Telephone Encounter (Signed)
Medication sent to pharmacy  

## 2019-08-21 ENCOUNTER — Telehealth: Payer: Self-pay

## 2019-08-21 NOTE — Telephone Encounter (Signed)
Pt called to schedule an apt s/p hospital visit. Pt hasn't been seen in 5 years. Pt is having some Gerd and infection Per pts pcp. Pt was scheduled for 09/05/19. Please advise if this is the right appointment slot, pt was scheduled in. If so, I can possible get pt in sooner.

## 2019-08-22 NOTE — Telephone Encounter (Signed)
She is on the schedule for 05/06 at 8:30

## 2019-08-22 NOTE — Telephone Encounter (Signed)
Spoke with pt. Pts apt was moved up to 08/28/19 with EG. Pt isn't able to eat much. Apt is a hospital f/u. Canceled 09/05/19 apt.

## 2019-08-28 ENCOUNTER — Other Ambulatory Visit: Payer: Self-pay

## 2019-08-28 ENCOUNTER — Ambulatory Visit: Payer: BC Managed Care – PPO | Admitting: Nurse Practitioner

## 2019-08-28 ENCOUNTER — Encounter: Payer: Self-pay | Admitting: Nurse Practitioner

## 2019-08-28 VITALS — BP 120/84 | HR 68 | Temp 97.0°F | Ht 65.0 in | Wt 216.0 lb

## 2019-08-28 DIAGNOSIS — K219 Gastro-esophageal reflux disease without esophagitis: Secondary | ICD-10-CM

## 2019-08-28 DIAGNOSIS — Z86010 Personal history of colonic polyps: Secondary | ICD-10-CM | POA: Diagnosis not present

## 2019-08-28 DIAGNOSIS — K5900 Constipation, unspecified: Secondary | ICD-10-CM

## 2019-08-28 DIAGNOSIS — R1319 Other dysphagia: Secondary | ICD-10-CM

## 2019-08-28 DIAGNOSIS — R1310 Dysphagia, unspecified: Secondary | ICD-10-CM | POA: Insufficient documentation

## 2019-08-28 DIAGNOSIS — Z860101 Personal history of adenomatous and serrated colon polyps: Secondary | ICD-10-CM

## 2019-08-28 DIAGNOSIS — K5792 Diverticulitis of intestine, part unspecified, without perforation or abscess without bleeding: Secondary | ICD-10-CM

## 2019-08-28 MED ORDER — SUPREP BOWEL PREP KIT 17.5-3.13-1.6 GM/177ML PO SOLN: 1.0000 | ORAL | 0 refills | Status: DC

## 2019-08-28 MED ORDER — PANTOPRAZOLE SODIUM 40 MG PO TBEC: 40.0000 mg | DELAYED_RELEASE_TABLET | Freq: Every day | ORAL | 3 refills | Status: DC

## 2019-08-28 NOTE — Progress Notes (Signed)
Referring Provider: Kathyrn Drown, MD Primary Care Physician:  Kathyrn Drown, MD Primary GI:  Dr. Oneida Alar  Chief Complaint  Patient presents with  . Abdominal Pain    "burning" all the time    HPI:   Theresa [name] is a 49 y.o. female who presents for hospital follow-up.  The patient has not been seen by our office since 2015 for history of constipation with abdominal pain and nausea.  The patient was admitted to the hospital 08/13/2019 through 08/14/2019 for diverticulitis.  She presented for worsening abdominal pain over the previous 24 hours without fevers, chills, or other red flag signs or symptoms.  4 years prior with a similar episode diagnosed with diverticulitis not requiring hospitalization.  Last bowel movement a couple days prior.  WBC count 10.8, potassium 3.2, otherwise unremarkable labs.  CT showed acute sigmoid diverticulitis without complication.  She improved with fluids, pain medicine, advanced to full liquids.  She was treated with Rocephin and Flagyl.  She was advanced to a soft diet which she tolerated and subsequently discharged home on Cipro and Flagyl for 9 additional days as well as a limited course of pain medication.  Recommended GI follow-up.  Colonoscopy last completed 04/24/2012 which found a 5 mm pedunculated polyp in the transverse colon, a 1.1 cm sigmoid colon polyp, moderate diverticulosis, small internal hemorrhoids.  Surgical pathology found the polyps to be tubular adenoma.  Recommended repeat exam in 5 years (2018).  She is currently overdue.  Today she states she's doing ok overall. Lower abdominal pain is improved. Feels like her "actual stomach organ" is burning non-stop. At time she can feel her food emptying from her stomach into her intestines. Pain LUQ, burning. Denies esophageal burning, bitter taste. Normally this is not a problem. Intermittent nausea, not on medication for it. Belching a lot. She is on MiraLAX and has had 1 good BM since leaving  the hospital but has smaller stools which do not feel constipated to her. Does have incomplete emptying, which is chronic for years for her. She had urinary retention which improved with needing to "physically manipulate myself to have a bowel movement." Denies hematochezia, melena, fever, chills, unintentional weight loss. Denies URI or flu-like symptoms. Denies loss of sense of taste or smell. She has nto had a COVID-19 vaccine and is not interested in information on it. Denies chest pain, dyspnea, dizziness, lightheadedness, syncope, near syncope. Denies any other upper or lower GI symptoms.  She is on Dexilant but doesn't take it regularly. Noted physical and was restarted on Dexilant. Did not take any pain medication after hospital d/c.  She notes she has had solid food dysphagia for a couple years, progressive in the past 6 months. Keeps fluid on hands.  Past Medical History:  Diagnosis Date  . Anxiety   . Depression   . Fibromyalgia   . GERD (gastroesophageal reflux disease)   . Headache(784.0)   . Heart murmur   . IFG (impaired fasting glucose)   . Obsessive-compulsive disorder   . Osteoarthritis   . Seizures ([place])    last seizure at age 44-stressed induced seizure.    Past Surgical History:  Procedure Laterality Date  . ABDOMINAL HYSTERECTOMY     still has right ovary  . BIOPSY N/A 03/05/2014   Procedure: DUODENAL AND GASTRIC BIOPSY;  Surgeon: Danie Binder, MD;  Location: [facility];  Service: Endoscopy;  Laterality: N/A;  . bladder sling X 3    . CHOLECYSTECTOMY    .  COLONOSCOPY  04/24/2012   [id number] polyp measuring 5 mm in size was found in the proximal transverse colon; polypectomy was performed with cold forceps Pedunculated polyp measuring 1.1 cm in size was found in the sigmoid colon; polypectomy was performed using snare cautery Moderate diverticulosis was noted in the descending colon and sigmoid colon small internal hemorrhoids. next tcs 04/2017  .  ESOPHAGOGASTRODUODENOSCOPY N/A 02/18/2014   Procedure: ESOPHAGOGASTRODUODENOSCOPY (EGD);  Surgeon: Danie Binder, MD;  Location: [facility];  Service: Endoscopy;  Laterality: N/A;  115  . ESOPHAGOGASTRODUODENOSCOPY (EGD) WITH PROPOFOL N/A 03/05/2014   SLF: 1. dyspepsia due to constipation, gastritis, and GERD 2. Multiple Gastric polyps 3. Moderate non-erosive gastritis 4. Diverticulum in the 2nd part of the duodenum  . TUBAL LIGATION      Current Outpatient Medications  Medication Sig Dispense Refill  . dexlansoprazole (DEXILANT) 60 MG capsule Take 1 capsule (60 mg total) by mouth daily. Prn acid reflux (Patient taking differently: Take 60 mg by mouth daily as needed. ) 30 capsule 2  . diazepam (VALIUM) 5 MG tablet TAKE 1/2 TO 1 TABLET BY MOUTH ONCE DAILY AS NEEDED FOR ANXIETY 30 tablet 0  . docusate sodium (STOOL SOFTENER) 100 MG capsule Take 100 mg by mouth daily.    Marland Kitchen estradiol (ESTRACE) 1 MG tablet Take 1 tablet (1 mg total) by mouth daily. 30 tablet 3  . hydrochlorothiazide (HYDRODIURIL) 25 MG tablet Take 1 tablet daily q AM PRN 30 tablet 2  . Magnesium 400 MG CAPS Take 1 tablet by mouth daily.     . Multiple Vitamin (MULTIVITAMIN) tablet Take 1 tablet by mouth daily.    . Probiotic Product (PROBIOTIC-10 PO) Take by mouth.    . rizatriptan (MAXALT-MLT) 10 MG disintegrating tablet TAKE 1 TABLET AS NEEDED FOR MIGRAIN, MAY REPEAT IN 2 HOURS MAX 2/24 HOURS 10 tablet 6  . ondansetron (ZOFRAN) 8 MG tablet Take 1 tablet ITD PRN (Patient not taking: Reported on 08/28/2019) 18 tablet 2   No current facility-administered medications for this visit.    Allergies as of 08/28/2019  . (No Known Allergies)    Family History  Problem Relation Age of Onset  . Alcohol abuse Mother   . Depression Mother   . Anxiety disorder Mother   . Paranoid behavior Mother   . Hypertension Mother   . Alcohol abuse Father   . Hypertension Father   . Physical abuse Brother   . Colon cancer Neg Hx   . ADD /  ADHD Neg Hx   . Bipolar disorder Neg Hx   . Dementia Neg Hx   . OCD Neg Hx   . Drug abuse Neg Hx   . Schizophrenia Neg Hx   . Seizures Neg Hx   . Sexual abuse Neg Hx     Social History   Socioeconomic History  . Marital status: Single    Spouse name: Not on file  . Number of children: 2  . Years of education: Not on file  . Highest education level: Not on file  Occupational History  . Occupation: OFFICE MGR    Employer: [employer]    Comment: Radio producer  Tobacco Use  . Smoking status: Never Smoker  . Smokeless tobacco: Never Used  . Tobacco comment: as a kid  Substance and Sexual Activity  . Alcohol use: Yes    Comment: occas  . Drug use: No  . Sexual activity: Not Currently    Birth control/protection: Surgical  Other Topics Concern  . Not on file  Social History Narrative   Separated from her husband; has 2 children   Social Determinants of Health   Financial Resource Strain:   . Difficulty of Paying Living Expenses:   Food Insecurity:   . Worried About Charity fundraiser in the Last Year:   . Arboriculturist in the Last Year:   Transportation Needs:   . Film/video editor (Medical):   Marland Kitchen Lack of Transportation (Non-Medical):   Physical Activity:   . Days of Exercise per Week:   . Minutes of Exercise per Session:   Stress:   . Feeling of Stress :   Social Connections:   . Frequency of Communication with Friends and Family:   . Frequency of Social Gatherings with Friends and Family:   . Attends Religious Services:   . Active Member of Clubs or Organizations:   . Attends Archivist Meetings:   Marland Kitchen Marital Status:     Subjective: Review of Systems  Constitutional: Negative for chills, fever, malaise/fatigue and weight loss.  HENT: Negative for congestion and sore throat.   Respiratory: Negative for cough and shortness of breath.   Cardiovascular: Negative for chest pain and palpitations.  Gastrointestinal: Positive  for abdominal pain, constipation and nausea. Negative for blood in stool, diarrhea, melena and vomiting.  Musculoskeletal: Negative for joint pain and myalgias.  Skin: Negative for rash.  Neurological: Negative for dizziness and weakness.  Endo/Heme/Allergies: Does not bruise/bleed easily.  Psychiatric/Behavioral: Negative for depression. The patient is not nervous/anxious.   All other systems reviewed and are negative.    Objective: BP 120/84   Pulse 68   Temp (!) 97 F (36.1 C) (Oral)   Ht 5\' 5"  (1.651 m)   Wt 216 lb (98 kg)   BMI 35.94 kg/m  Physical Exam Vitals and nursing note reviewed.  Constitutional:      General: She is not in acute distress.    Appearance: Normal appearance. She is well-developed. She is obese. She is not ill-appearing, toxic-appearing or diaphoretic.  HENT:     Head: Normocephalic and atraumatic.     Nose: No congestion or rhinorrhea.  Eyes:     General: No scleral icterus. Cardiovascular:     Rate and Rhythm: Normal rate and regular rhythm.     Heart sounds: Normal heart sounds.  Pulmonary:     Effort: Pulmonary effort is normal. No respiratory distress.     Breath sounds: Normal breath sounds.  Abdominal:     General: Abdomen is flat. Bowel sounds are normal.     Palpations: Abdomen is soft. There is no hepatomegaly, splenomegaly or mass.     Tenderness: There is abdominal tenderness in the epigastric area and left upper quadrant. There is no guarding or rebound.     Hernia: No hernia is present.  Skin:    General: Skin is warm and dry.     Coloration: Skin is not jaundiced.     Findings: No rash.  Neurological:     General: No focal deficit present.     Mental Status: She is alert and oriented to person, place, and time.  Psychiatric:        Attention and Perception: Attention normal.        Mood and Affect: Mood normal.        Speech: Speech normal.        Behavior: Behavior normal.        Thought  Content: Thought content normal.          Cognition and Memory: Cognition and memory normal.       08/28/2019 8:36 AM   Disclaimer: This note was dictated with voice recognition software. Similar sounding words can inadvertently be transcribed and may not be corrected upon review.

## 2019-08-28 NOTE — Assessment & Plan Note (Addendum)
The patient notes solid food dysphagia that she has had essentially for 2 years but is gotten progressively worse in the past 6 months.  Chronic history of GERD as per below.  Recommend she continue her Protonix 40 mg daily.  We will schedule her for an upper endoscopy with possible dilation to further evaluate and treat her GERD, dysphagia.  Further recommendations to follow.  Proceed with EGD +/- dilation on propofol/MAC with Dr. Gala Romney (in the absence of Dr. Oneida Alar) in near future: the risks, benefits, and alternatives have been discussed with the patient in detail. The patient states understanding and desires to proceed.  ASA II  The patient is currently on Valium.  Occasional alcohol use, denies drug use. The patient is not on any other anticoagulants, anxiolytics, chronic pain medications, antidepressants, antidiabetics, or iron supplements.  We will plan for the procedure on propofol/MAC to promote adequate sedation.

## 2019-08-28 NOTE — Assessment & Plan Note (Signed)
The patient admits left upper quadrant burning, no overt reflux type symptoms.  Intermittent nausea and frequent belching.  Previously placed on Dexilant but does not take it regularly because of all the possible side effects.  She is amenable to Protonix.  I started her on Protonix 40 mg once daily.  Call with a progress report or notify us of worsening symptoms.  Further recommendations pending clinical response.  Follow-up in 3 months.

## 2019-08-28 NOTE — Assessment & Plan Note (Signed)
The patient notes constipation with only one good bowel movement since leaving the hospital, although she does have smaller stools which do not feel constipated to her.  She does admit incomplete emptying which is chronic for a number of years.  She did have to physically disimpact herself recently.  I recommended trial of mild over-the-counter medications including Colace 100 mg daily and MiraLAX once a day, or twice a day as needed.  Increase fiber and water which will help with diverticular disease as well.  Call for any worsening or severe symptoms and follow-up in 3 months.

## 2019-08-28 NOTE — Assessment & Plan Note (Signed)
Acute diverticulitis with significant symptomatic improvement on antibiotics.  She is currently due for repeat colonoscopy due to history of colon polyps.  This allows to further evaluate for any colon cancer as it could be masked by diverticulitis.  Discussed use of high-fiber diet to help prevent recurrent diverticulitis.  This is her second episode in about 4 years.  Return for follow-up in 3 months.  Call for any worsening or severe symptoms.

## 2019-08-28 NOTE — Assessment & Plan Note (Addendum)
History of adenomatous colon polyp with last colonoscopy completed 2013 and was recommended to have a repeat in 5 years.  At that time she was going through a divorce and her healthcare got put to decide.  She is amendable to completing colonoscopy at this time.  She did have quite a large polyp at 1.1 cm.  Also a pedunculated polyp measuring 5 mm.  No red flag/warning signs or symptoms.  We will proceed with colonoscopy at this time.  Proceed with TCS on propofol/MAC with Dr. Gala Romney (in the absence of Dr. Oneida Alar) in near future: the risks, benefits, and alternatives have been discussed with the patient in detail. The patient states understanding and desires to proceed.  ASA II  The patient is currently on Valium.  Occasional alcohol use, denies drug use. The patient is not on any other anticoagulants, anxiolytics, chronic pain medications, antidepressants, antidiabetics, or iron supplements.  We will plan for the procedure on propofol/MAC to promote adequate sedation.

## 2019-08-28 NOTE — Patient Instructions (Addendum)
Your health issues we discussed today were:   Diverticulitis: 1. As we discussed, I feel your diverticulitis is likely much improved, essentially resolved after your antibiotics 2. Let us know if you have any worsening or recurrent lower abdominal pain  Constipation: 1. As we discussed, we will trial over-the-counter medications to help your constipation 2. Start taking Colace 100 mg once a day 3. Take MiraLAX 1 dose once a day.  You can increase this to twice a day on days that you need to 4. Try to eat a high-fiber diet with lots of vegetables, as you have been trying to do. 5. Call us if you have any worsening or severe symptoms  GERD (reflux/heartburn) with stomach burning and difficulty swallowing: 1. I sent a prescription to your pharmacy for Protonix 40 mg.  Take this once a day, pursing in the morning on an empty stomach 2. We will schedule an upper endoscopy with possible dilation to further evaluate your stomach burning and your swallowing difficulties 3. Call us if no improvement in your stomach burning after 1 to 2 months on your Protonix 4. Further recommendations will follow your upper endoscopy  History of colon polyps: 1. Because you are currently due/overdue for a colonoscopy we will add this on at same time as your upper endoscopy 2. Further recommendations will follow your colonoscopy  Overall I recommend:  1. Continue your other current medications 2. Return for follow-up in 3 months 3. Call us if you have any questions or concerns.   At [facility] we value your feedback. You may receive a survey about your visit today. Please share your experience as we strive to create trusting relationships with our patients to provide genuine, compassionate, quality care.  We appreciate your understanding and patience as we review any laboratory studies, imaging, and other diagnostic tests that are ordered as we care for you. Our office policy is 5 business days  for review of these results, and any emergent or urgent results are addressed in a timely manner for your best interest. If you do not hear from our office in 1 week, please contact us.   We also encourage the use of MyChart, which contains your medical information for your review as well. If you are not enrolled in this feature, an access code is on this after visit summary for your convenience. Thank you for allowing [place] to be involved in your care.  It was great to see you today!  I hope you have a great Summer!!

## 2019-08-29 ENCOUNTER — Telehealth: Payer: Self-pay | Admitting: *Deleted

## 2019-08-29 NOTE — Progress Notes (Signed)
Cc'ed to pcp °

## 2019-08-29 NOTE — Telephone Encounter (Signed)
-----   Message from Verta Ellen., NP sent at 08/28/2019  2:37 PM EDT ----- Regarding: Zio monitor Please tell the patient there were no major sustained arrhythmias to account for her symptoms. She had 3 short runs of a rapid heart rate. The longest lasting was also the fastest and was only five beats at 138. Everything else was a rare occurrence less than 1 % during the whole time she was being monitored. This is the summary listed below. I think it was a 14 day monitor.     Patient had a min HR of 51 bpm, max HR of 140 bpm, and avg HR of 78 bpm. Predominant underlying rhythm was Sinus Rhythm. 3 Supraventricular Tachycardia runs occurred, the run with the fastest interval lasting 5 beats with a max rate of 138 bpm (avg 125 bpm); the run with the fastest interval was also the longest. Isolated SVEs were rare (<1.0%), SVE Couplets were rare (<1.0%), and SVE Triplets were rare (<1.0%). Isolated VEs were rare (<1.0%, 5050), VE Couplets were rare (<1.0%, 1), and no VE Triplets were present. Ventricular Bigeminy was present.

## 2019-09-05 ENCOUNTER — Telehealth: Payer: Self-pay | Admitting: Family Medicine

## 2019-09-05 NOTE — Telephone Encounter (Signed)
Patient had to cancel follow up with Jonni Sanger due to work. She has not rescheduled her test due to wanting to make sure that it is still needed to be performed.   If she doesn't need to have stress test, we will need to call and reschedule her follow up.  She also has a question about her starting estrogen about three weeks prior to wearing the monitor.  Said that she will only be taking it for about 5 years and wanted to know how or if it could have had some effect on her results/issues

## 2019-09-06 ENCOUNTER — Ambulatory Visit: Payer: BC Managed Care – PPO | Admitting: Gastroenterology

## 2019-09-06 NOTE — Telephone Encounter (Signed)
-----   Message from Verta Ellen., NP sent at 09/06/2019  7:51 AM EDT ----- Please call patient and tell her the monitor showed normal sinus rhythm with some rare extra beats and very short-lived atrial runs.  She had no sustained arrhythmias that would account for her symptoms.  Thank you

## 2019-09-10 ENCOUNTER — Ambulatory Visit: Payer: BC Managed Care – PPO | Admitting: Family Medicine

## 2019-09-13 ENCOUNTER — Encounter: Payer: Self-pay | Admitting: *Deleted

## 2019-09-13 NOTE — Telephone Encounter (Signed)
Letter mailed

## 2019-09-21 ENCOUNTER — Encounter: Payer: Self-pay | Admitting: Nurse Practitioner

## 2019-09-29 ENCOUNTER — Other Ambulatory Visit: Payer: Self-pay

## 2019-09-29 ENCOUNTER — Ambulatory Visit
Admission: EM | Admit: 2019-09-29 | Discharge: 2019-09-29 | Disposition: A | Discharge status: Home / Self Care | Payer: BC Managed Care – PPO | Attending: Emergency Medicine | Admitting: Emergency Medicine

## 2019-09-29 DIAGNOSIS — Z20822 Contact with and (suspected) exposure to covid-19: Secondary | ICD-10-CM

## 2019-09-29 DIAGNOSIS — J019 Acute sinusitis, unspecified: Secondary | ICD-10-CM

## 2019-09-29 DIAGNOSIS — J069 Acute upper respiratory infection, unspecified: Secondary | ICD-10-CM

## 2019-09-29 MED ORDER — AMOXICILLIN-POT CLAVULANATE 875-125 MG PO TABS
1.0000 | ORAL_TABLET | Freq: Two times a day (BID) | ORAL | 0 refills | Status: DC
Start: 2019-09-29 — End: 2019-09-30

## 2019-09-29 MED ORDER — BENZONATATE 100 MG PO CAPS
100.0000 mg | ORAL_CAPSULE | Freq: Three times a day (TID) | ORAL | 0 refills | Status: DC
Start: 2019-09-29 — End: 2019-10-14

## 2019-09-29 MED ORDER — FLUTICASONE PROPIONATE 50 MCG/ACT NA SUSP
2.0000 | Freq: Every day | NASAL | 0 refills | Status: DC
Start: 2019-09-29 — End: 2019-10-14

## 2019-09-29 MED ORDER — CETIRIZINE HCL 10 MG PO TABS
10.0000 mg | ORAL_TABLET | Freq: Every day | ORAL | 0 refills | Status: DC
Start: 2019-09-29 — End: 2019-11-15

## 2019-09-29 NOTE — Discharge Instructions (Signed)
Declines COVID test at this time  In the meantime: You should remain isolated in your home for 10 days from symptom onset AND greater than 72 hours after symptoms resolution (absence of fever without the use of fever-reducing medication and improvement in respiratory symptoms), whichever is longer Get plenty of rest and push fluids Augmentin for possible sinus infection.  Fill in 1-2 days if symptoms not improving Tessalon Perles prescribed for cough Use OTC zyrtec for nasal congestion, runny nose, and/or sore throat Use OTC flonase for nasal congestion and runny nose Use medications daily for symptom relief Use OTC medications like ibuprofen or tylenol as needed fever or pain Call or go to the ED if you have any new or worsening symptoms such as fever, worsening cough, shortness of breath, chest tightness, chest pain, turning blue, changes in mental status, etc..Marland Kitchen

## 2019-09-29 NOTE — ED Provider Notes (Signed)
[place]   [id number] 09/29/19 Arrival Time: 3976   CC: URI  SUBJECTIVE: History from: patient.  Theresa [name] is a 49 y.o. female who presents with runny nose, congestion, sneezing, watery eyes, sore throat, cough, sinus pain/ pressure, and fatigue x 4-5 days.  Denies sick exposure to COVID, flu or strep.  Has tried OTC medications without relief.  Denies aggravating factors.  Reports previous symptoms in the past with allergies.   Denies fever, chills, SOB, wheezing, chest pain, nausea, changes in bowel or bladder habits.    ROS: As per HPI.  All other pertinent ROS negative.     Past Medical History:  Diagnosis Date  . Anxiety   . Depression   . Fibromyalgia   . GERD (gastroesophageal reflux disease)   . Headache(784.0)   . Heart murmur   . IFG (impaired fasting glucose)   . Obsessive-compulsive disorder   . Osteoarthritis   . Seizures ([place])    last seizure at age 36-stressed induced seizure.   Past Surgical History:  Procedure Laterality Date  . ABDOMINAL HYSTERECTOMY     still has right ovary  . BIOPSY N/A 03/05/2014   Procedure: DUODENAL AND GASTRIC BIOPSY;  Surgeon: Danie Binder, MD;  Location: [facility];  Service: Endoscopy;  Laterality: N/A;  . bladder sling X 3    . CHOLECYSTECTOMY    . COLONOSCOPY  04/24/2012   [id number] polyp measuring 5 mm in size was found in the proximal transverse colon; polypectomy was performed with cold forceps Pedunculated polyp measuring 1.1 cm in size was found in the sigmoid colon; polypectomy was performed using snare cautery Moderate diverticulosis was noted in the descending colon and sigmoid colon small internal hemorrhoids. next tcs 04/2017  . ESOPHAGOGASTRODUODENOSCOPY N/A 02/18/2014   Procedure: ESOPHAGOGASTRODUODENOSCOPY (EGD);  Surgeon: Danie Binder, MD;  Location: [facility];  Service: Endoscopy;  Laterality: N/A;  115  . ESOPHAGOGASTRODUODENOSCOPY (EGD) WITH PROPOFOL N/A 03/05/2014   SLF: 1. dyspepsia due  to constipation, gastritis, and GERD 2. Multiple Gastric polyps 3. Moderate non-erosive gastritis 4. Diverticulum in the 2nd part of the duodenum  . TUBAL LIGATION     Allergies  Allergen Reactions  . Fentanyl Other (See Comments)    Doesn't like the way it feels    No current facility-administered medications on file prior to encounter.   Current Outpatient Medications on File Prior to Encounter  Medication Sig Dispense Refill  . dexlansoprazole (DEXILANT) 60 MG capsule Take 1 capsule (60 mg total) by mouth daily. Prn acid reflux (Patient taking differently: Take 60 mg by mouth daily as needed. ) 30 capsule 2  . diazepam (VALIUM) 5 MG tablet TAKE 1/2 TO 1 TABLET BY MOUTH ONCE DAILY AS NEEDED FOR ANXIETY 30 tablet 0  . docusate sodium (STOOL SOFTENER) 100 MG capsule Take 100 mg by mouth daily.    Marland Kitchen estradiol (ESTRACE) 1 MG tablet Take 1 tablet (1 mg total) by mouth daily. 30 tablet 3  . hydrochlorothiazide (HYDRODIURIL) 25 MG tablet Take 1 tablet daily q AM PRN 30 tablet 2  . Magnesium 400 MG CAPS Take 1 tablet by mouth daily.     . Multiple Vitamin (MULTIVITAMIN) tablet Take 1 tablet by mouth daily.    . Na Sulfate-K Sulfate-Mg Sulf (SUPREP BOWEL PREP KIT) 17.5-3.13-1.6 GM/177ML SOLN Take 1 kit by mouth as directed. 354 mL 0  . pantoprazole (PROTONIX) 40 MG tablet Take 1 tablet (40 mg total) by mouth daily. 30 tablet 3  .  Probiotic Product (PROBIOTIC-10 PO) Take by mouth.    . rizatriptan (MAXALT-MLT) 10 MG disintegrating tablet TAKE 1 TABLET AS NEEDED FOR MIGRAIN, MAY REPEAT IN 2 HOURS MAX 2/24 HOURS 10 tablet 6  . [DISCONTINUED] buPROPion (WELLBUTRIN XL) 150 MG 24 hr tablet Take one tab qam (Patient not taking: Reported on 03/19/2019) 30 tablet 2   Social History   Socioeconomic History  . Marital status: Single    Spouse name: Not on file  . Number of children: 2  . Years of education: Not on file  . Highest education level: Not on file  Occupational History  . Occupation:  OFFICE MGR    Employer: [employer]    Comment: Radio producer  Tobacco Use  . Smoking status: Never Smoker  . Smokeless tobacco: Never Used  . Tobacco comment: as a kid  Substance and Sexual Activity  . Alcohol use: Yes    Comment: occas  . Drug use: No  . Sexual activity: Not Currently    Birth control/protection: Surgical  Other Topics Concern  . Not on file  Social History Narrative   Separated from her husband; has 2 children   Social Determinants of Health   Financial Resource Strain:   . Difficulty of Paying Living Expenses:   Food Insecurity:   . Worried About Charity fundraiser in the Last Year:   . Arboriculturist in the Last Year:   Transportation Needs:   . Film/video editor (Medical):   Marland Kitchen Lack of Transportation (Non-Medical):   Physical Activity:   . Days of Exercise per Week:   . Minutes of Exercise per Session:   Stress:   . Feeling of Stress :   Social Connections:   . Frequency of Communication with Friends and Family:   . Frequency of Social Gatherings with Friends and Family:   . Attends Religious Services:   . Active Member of Clubs or Organizations:   . Attends Archivist Meetings:   Marland Kitchen Marital Status:   Intimate Partner Violence:   . Fear of Current or Ex-Partner:   . Emotionally Abused:   Marland Kitchen Physically Abused:   . Sexually Abused:    Family History  Problem Relation Age of Onset  . Alcohol abuse Mother   . Depression Mother   . Anxiety disorder Mother   . Paranoid behavior Mother   . Hypertension Mother   . Alcohol abuse Father   . Hypertension Father   . Physical abuse Brother   . Colon cancer Neg Hx   . ADD / ADHD Neg Hx   . Bipolar disorder Neg Hx   . Dementia Neg Hx   . OCD Neg Hx   . Drug abuse Neg Hx   . Schizophrenia Neg Hx   . Seizures Neg Hx   . Sexual abuse Neg Hx     OBJECTIVE:  Vitals:   09/29/19 1252  BP: 118/79  Pulse: 91  Resp: 17  Temp: 99 F (37.2 C)  TempSrc: Oral   SpO2: 97%     General appearance: alert; appears fatigued, but nontoxic; speaking in full sentences and tolerating own secretions HEENT: NCAT; Ears: EACs clear, TMs pearly gray; Eyes: PERRL.  EOM grossly intact. Sinuses: TTP over maxillary sinuses; Nose: nares patent without rhinorrhea, Throat: oropharynx clear, tonsils non erythematous or enlarged, uvula midline  Neck: supple without LAD Lungs: unlabored respirations, symmetrical air entry; cough: mild; no respiratory distress; CTAB Heart: regular rate and rhythm.  Skin: warm and dry Psychological: alert and cooperative; normal mood and affect   ASSESSMENT & PLAN:  1. Viral URI with cough   2. Acute non-recurrent sinusitis, unspecified location   3. Suspected COVID-19 virus infection     Meds ordered this encounter  Medications  . cetirizine (ZYRTEC) 10 MG tablet    Sig: Take 1 tablet (10 mg total) by mouth daily.    Dispense:  30 tablet    Refill:  0    Order Specific Question:   Supervising Provider    Answer:   Raylene Everts [[id number]]  . fluticasone (FLONASE) 50 MCG/ACT nasal spray    Sig: Place 2 sprays into both nostrils daily.    Dispense:  16 g    Refill:  0    Order Specific Question:   Supervising Provider    Answer:   Raylene Everts [[id number]]  . benzonatate (TESSALON) 100 MG capsule    Sig: Take 1 capsule (100 mg total) by mouth every 8 (eight) hours.    Dispense:  21 capsule    Refill:  0    Order Specific Question:   Supervising Provider    Answer:   Raylene Everts [[id number]]  . amoxicillin-clavulanate (AUGMENTIN) 875-125 MG tablet    Sig: Take 1 tablet by mouth every 12 (twelve) hours for 10 days.    Dispense:  20 tablet    Refill:  0    Order Specific Question:   Supervising Provider    Answer:   Raylene Everts [[id number]]   Declines COVID test at this time  In the meantime: You should remain isolated in your home for 10 days from symptom onset AND greater than 72 hours after symptoms  resolution (absence of fever without the use of fever-reducing medication and improvement in respiratory symptoms), whichever is longer Get plenty of rest and push fluids Augmentin for possible sinus infection.  Fill in 1-2 days if symptoms not improving Tessalon Perles prescribed for cough Use OTC zyrtec for nasal congestion, runny nose, and/or sore throat Use OTC flonase for nasal congestion and runny nose Use medications daily for symptom relief Use OTC medications like ibuprofen or tylenol as needed fever or pain Call or go to the ED if you have any new or worsening symptoms such as fever, worsening cough, shortness of breath, chest tightness, chest pain, turning blue, changes in mental status, etc...   Reviewed expectations re: course of current medical issues. Questions answered. Outlined signs and symptoms indicating need for more acute intervention. Patient verbalized understanding. After Visit Summary given.         Lestine Box, PA-C 09/29/19 1315

## 2019-09-29 NOTE — ED Triage Notes (Signed)
Pt has had cough and sneezing for past 4 days

## 2019-09-30 ENCOUNTER — Telehealth: Payer: Self-pay | Admitting: Emergency Medicine

## 2019-09-30 MED ORDER — AMOXICILLIN-POT CLAVULANATE 400-57 MG/5ML PO SUSR: 875.0000 mg | Freq: Two times a day (BID) | ORAL | 0 refills | Status: AC

## 2019-09-30 MED ORDER — AMOXICILLIN-POT CLAVULANATE 400-57 MG/5ML PO SUSR
875.0000 mg | Freq: Two times a day (BID) | ORAL | 0 refills | Status: DC
Start: 2019-09-30 — End: 2019-09-30

## 2019-09-30 NOTE — Telephone Encounter (Signed)
Patient cannot swallow pills

## 2019-09-30 NOTE — Telephone Encounter (Signed)
Patient request pharmacy change. 

## 2019-10-03 ENCOUNTER — Encounter: Payer: Self-pay | Admitting: Nurse Practitioner

## 2019-10-04 ENCOUNTER — Other Ambulatory Visit: Payer: Self-pay | Admitting: Nurse Practitioner

## 2019-10-04 MED ORDER — ATOMOXETINE HCL 40 MG PO CAPS: 40.0000 mg | ORAL_CAPSULE | Freq: Every day | ORAL | 0 refills | Status: DC

## 2019-10-07 ENCOUNTER — Encounter: Payer: Self-pay | Admitting: Emergency Medicine

## 2019-10-07 ENCOUNTER — Ambulatory Visit
Admission: EM | Admit: 2019-10-07 | Discharge: 2019-10-07 | Disposition: A | Discharge status: Home / Self Care | Payer: BC Managed Care – PPO | Attending: Emergency Medicine | Admitting: Emergency Medicine

## 2019-10-07 ENCOUNTER — Ambulatory Visit (INDEPENDENT_AMBULATORY_CARE_PROVIDER_SITE_OTHER): Payer: BC Managed Care – PPO

## 2019-10-07 DIAGNOSIS — M79631 Pain in right forearm: Secondary | ICD-10-CM

## 2019-10-07 NOTE — ED Triage Notes (Signed)
Rt forearm pain, worse when pt puts pressure on it.  Has been having pain for about 2-3 weeks.

## 2019-10-07 NOTE — Discharge Instructions (Signed)
Right forearm x-ray is negative for bony abnormality including fracture or dislocation May take OTC Tylenol/ibuprofen as needed for pain Follow-up with PCP if symptoms does not resolve for further evaluation Return or go to ED for worsening of symptoms

## 2019-10-07 NOTE — ED Provider Notes (Signed)
[facility]    CSN: [id number] Arrival date & time: 10/07/19  1409      History   Chief Complaint Chief Complaint  Patient presents with  . Arm Pain    right arm    HPI Theresa [name] is a 49 y.o. female.   Who presented to the urgent care with a complaint of right forearm pain has been getting worse the past 2 to 3 weeks.  Reported when she pulled her arm on a table she has pain.  Denies any precipitating event.  Patient report couple months ago she had a fall from her attic.  Described the pain as constant and achy.  Has tried OTC Tylenol/ibuprofen with relief.  Denies any symptoms in the past.  Denies chills, fever, nausea, vomiting, diarrhea.    Arm Pain    Past Medical History:  Diagnosis Date  . Anxiety   . Depression   . Fibromyalgia   . GERD (gastroesophageal reflux disease)   . Headache(784.0)   . Heart murmur   . IFG (impaired fasting glucose)   . Obsessive-compulsive disorder   . Osteoarthritis   . Seizures ([place])    last seizure at age 69-stressed induced seizure.    Patient Active Problem List   Diagnosis Date Noted  . History of adenomatous polyp of colon 08/28/2019  . Dysphagia 08/28/2019  . Acute diverticulitis 08/13/2019  . Hypokalemia 08/13/2019  . Elevated liver transaminase level   . Vaginal discharge 07/11/2019  . Hair loss 07/11/2019  . Body aches 07/11/2019  . Brain fog 07/11/2019  . Rectocele 07/11/2019  . Sleep disturbance 07/11/2019  . Gastroesophageal reflux disease without esophagitis 05/21/2019  . Prediabetes 11/26/2017  . Migraine without aura and without status migrainosus, not intractable 10/04/2017  . Morbid obesity ([place]) 04/10/2016  . Diverticulosis of large intestine 12/05/2015  . BRCA negative 02/10/2015  . IFG (impaired fasting glucose) 07/05/2014  . Essential hypertension, benign 04/27/2014  . Anxiety 04/27/2014  . Abdominal pain, epigastric 02/07/2014  . Nausea without vomiting 02/07/2014  . Bloating  02/07/2014  . Fatigue 09/18/2012  . Hyperglycemia 09/18/2012  . Dysthymia 07/14/2012  . Insomnia due to mental disorder 07/14/2012  . Lower abdominal pain 04/12/2012  . Headache(784.0) 04/12/2012  . Constipation 04/12/2012    Past Surgical History:  Procedure Laterality Date  . ABDOMINAL HYSTERECTOMY     still has right ovary  . BIOPSY N/A 03/05/2014   Procedure: DUODENAL AND GASTRIC BIOPSY;  Surgeon: Danie Binder, MD;  Location: [facility];  Service: Endoscopy;  Laterality: N/A;  . bladder sling X 3    . CHOLECYSTECTOMY    . COLONOSCOPY  04/24/2012   [id number] polyp measuring 5 mm in size was found in the proximal transverse colon; polypectomy was performed with cold forceps Pedunculated polyp measuring 1.1 cm in size was found in the sigmoid colon; polypectomy was performed using snare cautery Moderate diverticulosis was noted in the descending colon and sigmoid colon small internal hemorrhoids. next tcs 04/2017  . ESOPHAGOGASTRODUODENOSCOPY N/A 02/18/2014   Procedure: ESOPHAGOGASTRODUODENOSCOPY (EGD);  Surgeon: Danie Binder, MD;  Location: [facility];  Service: Endoscopy;  Laterality: N/A;  115  . ESOPHAGOGASTRODUODENOSCOPY (EGD) WITH PROPOFOL N/A 03/05/2014   SLF: 1. dyspepsia due to constipation, gastritis, and GERD 2. Multiple Gastric polyps 3. Moderate non-erosive gastritis 4. Diverticulum in the 2nd part of the duodenum  . TUBAL LIGATION      OB History    Gravida  2   Para  2   Term      Preterm      AB  0   Living  2     SAB      TAB      Ectopic  0   Multiple      Live Births               Home Medications    Prior to Admission medications   Medication Sig Start Date End Date Taking? Authorizing Provider  atomoxetine (STRATTERA) 40 MG capsule Take 1 capsule (40 mg total) by mouth daily. 10/04/19  Yes Pearson Forster C, NP  diazepam (VALIUM) 5 MG tablet TAKE 1/2 TO 1 TABLET BY MOUTH ONCE DAILY AS NEEDED FOR ANXIETY 05/24/19  Yes Nilda Simmer, NP  docusate sodium (STOOL SOFTENER) 100 MG capsule Take 100 mg by mouth daily.   Yes [provider]  estradiol (ESTRACE) 1 MG tablet Take 1 tablet (1 mg total) by mouth daily. 07/13/19 07/12/20 Yes Estill Dooms, NP  fluticasone (FLONASE) 50 MCG/ACT nasal spray Place 2 sprays into both nostrils daily. 09/29/19  Yes Wurst, [place], PA-C  hydrochlorothiazide (HYDRODIURIL) 25 MG tablet Take 1 tablet daily q AM PRN 08/16/19  Yes Luking, Elayne Snare, MD  Magnesium 400 MG CAPS Take 1 tablet by mouth daily.    Yes [provider]  Multiple Vitamin (MULTIVITAMIN) tablet Take 1 tablet by mouth daily.   Yes [provider]  Na Sulfate-K Sulfate-Mg Sulf (SUPREP BOWEL PREP KIT) 17.5-3.13-1.6 GM/177ML SOLN Take 1 kit by mouth as directed. 08/28/19  Yes Rourk, Cristopher Estimable, MD  pantoprazole (PROTONIX) 40 MG tablet Take 1 tablet (40 mg total) by mouth daily. 08/28/19  Yes Carlis Stable, NP  rizatriptan (MAXALT-MLT) 10 MG disintegrating tablet TAKE 1 TABLET AS NEEDED FOR MIGRAIN, MAY REPEAT IN 2 HOURS MAX 2/24 HOURS 03/19/19  Yes Kathyrn Drown, MD  amoxicillin-clavulanate (AUGMENTIN) 400-57 MG/5ML suspension Take 10.9 mLs (875 mg total) by mouth 2 (two) times daily for 10 days. 09/30/19 10/10/19  Wurst, Marye Round, PA-C  benzonatate (TESSALON) 100 MG capsule Take 1 capsule (100 mg total) by mouth every 8 (eight) hours. 09/29/19   Wurst, [place], PA-C  cetirizine (ZYRTEC) 10 MG tablet Take 1 tablet (10 mg total) by mouth daily. 09/29/19   Wurst, [place], PA-C  dexlansoprazole (DEXILANT) 60 MG capsule Take 1 capsule (60 mg total) by mouth daily. Prn acid reflux Patient taking differently: Take 60 mg by mouth daily as needed.  05/24/19   Nilda Simmer, NP  Probiotic Product (PROBIOTIC-10 PO) Take by mouth.    [provider]  buPROPion (WELLBUTRIN XL) 150 MG 24 hr tablet Take one tab qam Patient not taking: Reported on 03/19/2019 01/12/19 03/28/19  Nilda Simmer, NP     Family History Family History  Problem Relation Age of Onset  . Alcohol abuse Mother   . Depression Mother   . Anxiety disorder Mother   . Paranoid behavior Mother   . Hypertension Mother   . Alcohol abuse Father   . Hypertension Father   . Physical abuse Brother   . Colon cancer Neg Hx   . ADD / ADHD Neg Hx   . Bipolar disorder Neg Hx   . Dementia Neg Hx   . OCD Neg Hx   . Drug abuse Neg Hx   . Schizophrenia Neg Hx   . Seizures Neg Hx   . Sexual abuse Neg Hx  Social History Social History   Tobacco Use  . Smoking status: Never Smoker  . Smokeless tobacco: Never Used  . Tobacco comment: as a kid  Substance Use Topics  . Alcohol use: Yes    Comment: occas  . Drug use: No     Allergies   Fentanyl   Review of Systems Review of Systems  Respiratory: Negative.   Cardiovascular: Negative.   Musculoskeletal: Positive for arthralgias.  All other systems reviewed and are negative.    Physical Exam Triage Vital Signs ED Triage Vitals  Enc Vitals Group     BP 10/07/19 1424 (!) 81/57     Pulse Rate 10/07/19 1424 83     Resp 10/07/19 1424 16     Temp 10/07/19 1424 98.9 F (37.2 C)     Temp Source 10/07/19 1424 Oral     SpO2 10/07/19 1424 95 %     Weight --      Height --      Head Circumference --      Peak Flow --      Pain Score 10/07/19 1425 1     Pain Loc --      Pain Edu? --      Excl. in [place]? --    No data found.  Updated Vital Signs BP 104/73 (BP Location: Right Arm)   Pulse 83   Temp 98.9 F (37.2 C) (Oral)   Resp 16   SpO2 95%   Visual Acuity Right Eye Distance:   Left Eye Distance:   Bilateral Distance:    Right Eye Near:   Left Eye Near:    Bilateral Near:     Physical Exam Vitals and nursing note reviewed.  Constitutional:      General: She is not in acute distress.    Appearance: Normal appearance. She is normal weight. She is not ill-appearing, toxic-appearing or diaphoretic.  Cardiovascular:     Rate and Rhythm:  Normal rate and regular rhythm.     Pulses: Normal pulses.     Heart sounds: Normal heart sounds. No murmur. No gallop.   Pulmonary:     Effort: Pulmonary effort is normal. No respiratory distress.     Breath sounds: Normal breath sounds. No stridor. No wheezing, rhonchi or rales.  Chest:     Chest wall: No tenderness.  Musculoskeletal:        General: Tenderness present.     Right forearm: Tenderness present. No swelling.     Left forearm: Normal.     Comments: Right forearm is without any obvious asymmetry or deformity compared to the left forearm.  There is no surface trauma, ecchymosis, open wound, warmth present.  Patient is able to flex and extend her arm.  Neurovascular status intact.  Neurological:     Mental Status: She is alert.      UC Treatments / Results  Labs (all labs ordered are listed, but only abnormal results are displayed) Labs Reviewed - No data to display  EKG   Radiology DG Forearm Right  Result Date: 10/07/2019 CLINICAL DATA:  Pain post fall in December EXAM: RIGHT FOREARM - 2 VIEW COMPARISON:  None. FINDINGS: There is no evidence of fracture or other focal bone lesions. Soft tissues are unremarkable. IMPRESSION: Negative. Electronically Signed   By: Lucrezia Europe M.D.   On: 10/07/2019 15:03    Procedures Procedures (including critical care time)  Medications Ordered in UC Medications - No data to display  Initial Impression /  Assessment and Plan / UC Course  I have reviewed the triage vital signs and the nursing notes.  Pertinent labs & imaging results that were available during my care of the patient were reviewed by me and considered in my medical decision making (see chart for details).   Patient is stable at discharge.  Right forearm x-ray is negative for bony abnormality including fracture or dislocation.  I have reviewed the x-ray myself and the radiologist interpretation.  I am in agreement with the radiologist interpretation.  Advised to  take OTC Tylenol/ibuprofen as needed for pain.  To follow PCP     Final diagnoses:  Right forearm pain     Discharge Instructions     Right forearm x-ray is negative for bony abnormality including fracture or dislocation May take OTC Tylenol/ibuprofen as needed for pain Follow-up with PCP if symptoms does not resolve for further evaluation Return or go to ED for worsening of symptoms    ED Prescriptions    None     PDMP not reviewed this encounter.   Emerson Monte, FNP 10/07/19 1513

## 2019-10-12 ENCOUNTER — Other Ambulatory Visit: Payer: Self-pay | Admitting: Nurse Practitioner

## 2019-10-14 ENCOUNTER — Ambulatory Visit
Admission: EM | Admit: 2019-10-14 | Discharge: 2019-10-14 | Disposition: A | Discharge status: Home / Self Care | Payer: BC Managed Care – PPO | Attending: Emergency Medicine | Admitting: Emergency Medicine

## 2019-10-14 DIAGNOSIS — M25562 Pain in left knee: Secondary | ICD-10-CM | POA: Diagnosis not present

## 2019-10-14 DIAGNOSIS — S8392XA Sprain of unspecified site of left knee, initial encounter: Secondary | ICD-10-CM

## 2019-10-14 MED ORDER — MELOXICAM 15 MG PO TABS
15.0000 mg | ORAL_TABLET | Freq: Every day | ORAL | 0 refills | Status: DC
Start: 2019-10-14 — End: 2019-11-15

## 2019-10-14 NOTE — Discharge Instructions (Signed)
Continue conservative management of rest, ice, and elevation Take mobic as needed for pain relief (may cause abdominal discomfort, ulcers, and GI bleeds avoid taking with other NSAIDs) Follow up with orthopedist if symptoms do not improve over the course of the week, you may need to have an MRI Return or go to the ER if you have any new or worsening symptoms (fever, chills, chest pain, redness, swelling, bruising, etc...)

## 2019-10-14 NOTE — ED Provider Notes (Signed)
[place]   [id number] 10/14/19 Arrival Time: 6301  CC: LT knee PAIN  SUBJECTIVE: History from: patient. Theresa [name] is a 49 y.o. female complains of LT knee pain and injury that began 1 day ago.  Pivoted on LT knee while standing and felt a pop.  Localizes the pain to the front of knee.  Describes the pain as constant and "pain" that is intermittently throbbing in character.  Has tried OTC medications without relief.  Symptoms are made worse with walking.  Denies similar symptoms in the past.  Complains of associated weakness.  Denies fever, chills, erythema, ecchymosis, effusion, numbness and tingling.  ROS: As per HPI.  All other pertinent ROS negative.     Past Medical History:  Diagnosis Date  . Anxiety   . Depression   . Fibromyalgia   . GERD (gastroesophageal reflux disease)   . Headache(784.0)   . Heart murmur   . IFG (impaired fasting glucose)   . Obsessive-compulsive disorder   . Osteoarthritis   . Seizures ([place])    last seizure at age 41-stressed induced seizure.   Past Surgical History:  Procedure Laterality Date  . ABDOMINAL HYSTERECTOMY     still has right ovary  . BIOPSY N/A 03/05/2014   Procedure: DUODENAL AND GASTRIC BIOPSY;  Surgeon: Danie Binder, MD;  Location: [facility];  Service: Endoscopy;  Laterality: N/A;  . bladder sling X 3    . CHOLECYSTECTOMY    . COLONOSCOPY  04/24/2012   SWF:UXNATFT polyp measuring 5 mm in size was found in the proximal transverse colon; polypectomy was performed with cold forceps Pedunculated polyp measuring 1.1 cm in size was found in the sigmoid colon; polypectomy was performed using snare cautery Moderate diverticulosis was noted in the descending colon and sigmoid colon small internal hemorrhoids. next tcs 04/2017  . ESOPHAGOGASTRODUODENOSCOPY N/A 02/18/2014   Procedure: ESOPHAGOGASTRODUODENOSCOPY (EGD);  Surgeon: Danie Binder, MD;  Location: [facility];  Service: Endoscopy;  Laterality: N/A;  115  .  ESOPHAGOGASTRODUODENOSCOPY (EGD) WITH PROPOFOL N/A 03/05/2014   SLF: 1. dyspepsia due to constipation, gastritis, and GERD 2. Multiple Gastric polyps 3. Moderate non-erosive gastritis 4. Diverticulum in the 2nd part of the duodenum  . TUBAL LIGATION     Allergies  Allergen Reactions  . Fentanyl Other (See Comments)    Doesn't like the way it feels    No current facility-administered medications on file prior to encounter.   Current Outpatient Medications on File Prior to Encounter  Medication Sig Dispense Refill  . atomoxetine (STRATTERA) 40 MG capsule Take 1 capsule (40 mg total) by mouth daily. 30 capsule 0  . diazepam (VALIUM) 5 MG tablet TAKE 1/2 TO 1 TABLET BY MOUTH ONCE DAILY AS NEEDED FOR ANXIETY 30 tablet 0  . estradiol (ESTRACE) 1 MG tablet Take 1 tablet (1 mg total) by mouth daily. 30 tablet 3  . hydrochlorothiazide (HYDRODIURIL) 25 MG tablet Take 1 tablet daily q AM PRN (Patient taking differently: Take 25 mg by mouth daily as needed (fluid). Take 1 tablet daily q AM PRN) 30 tablet 2  . Magnesium 400 MG CAPS Take 400 mg by mouth daily.     . Multiple Vitamin (MULTIVITAMIN) tablet Take 1 tablet by mouth daily.    . Na Sulfate-K Sulfate-Mg Sulf (SUPREP BOWEL PREP KIT) 17.5-3.13-1.6 GM/177ML SOLN Take 1 kit by mouth as directed. (Patient not taking: Reported on 10/11/2019) 354 mL 0  . pantoprazole (PROTONIX) 40 MG tablet Take 1 tablet (40  mg total) by mouth daily. 30 tablet 3  . Potassium 99 MG TABS Take 99 mg by mouth daily.    . potassium chloride (KLOR-CON) 10 MEQ tablet Take 10 mEq by mouth daily as needed (when taking HCTZ).    . rizatriptan (MAXALT-MLT) 10 MG disintegrating tablet TAKE 1 TABLET AS NEEDED FOR MIGRAIN, MAY REPEAT IN 2 HOURS MAX 2/24 HOURS (Patient taking differently: Take 10 mg by mouth See admin instructions. TAKE 1 TABLET AS NEEDED FOR MIGRAIN, MAY REPEAT IN 2 HOURS MAX 2/24 HOURS) 10 tablet 6  . [DISCONTINUED] buPROPion (WELLBUTRIN XL) 150 MG 24 hr tablet Take  one tab qam (Patient not taking: Reported on 03/19/2019) 30 tablet 2  . [DISCONTINUED] cetirizine (ZYRTEC) 10 MG tablet Take 1 tablet (10 mg total) by mouth daily. (Patient not taking: Reported on 10/11/2019) 30 tablet 0  . [DISCONTINUED] dexlansoprazole (DEXILANT) 60 MG capsule Take 1 capsule (60 mg total) by mouth daily. Prn acid reflux (Patient not taking: Reported on 10/11/2019) 30 capsule 2  . [DISCONTINUED] fluticasone (FLONASE) 50 MCG/ACT nasal spray Place 2 sprays into both nostrils daily. (Patient not taking: Reported on 10/11/2019) 16 g 0   Social History   Socioeconomic History  . Marital status: Single    Spouse name: Not on file  . Number of children: 2  . Years of education: Not on file  . Highest education level: Not on file  Occupational History  . Occupation: OFFICE MGR    Employer: [employer]    Comment: Radio producer  Tobacco Use  . Smoking status: Never Smoker  . Smokeless tobacco: Never Used  . Tobacco comment: as a kid  Vaping Use  . Vaping Use: Never used  Substance and Sexual Activity  . Alcohol use: Yes    Comment: occas  . Drug use: No  . Sexual activity: Not Currently    Birth control/protection: Surgical  Other Topics Concern  . Not on file  Social History Narrative   Separated from her husband; has 2 children   Social Determinants of Health   Financial Resource Strain:   . Difficulty of Paying Living Expenses:   Food Insecurity:   . Worried About Charity fundraiser in the Last Year:   . Arboriculturist in the Last Year:   Transportation Needs:   . Film/video editor (Medical):   Marland Kitchen Lack of Transportation (Non-Medical):   Physical Activity:   . Days of Exercise per Week:   . Minutes of Exercise per Session:   Stress:   . Feeling of Stress :   Social Connections:   . Frequency of Communication with Friends and Family:   . Frequency of Social Gatherings with Friends and Family:   . Attends Religious Services:   .  Active Member of Clubs or Organizations:   . Attends Archivist Meetings:   Marland Kitchen Marital Status:   Intimate Partner Violence:   . Fear of Current or Ex-Partner:   . Emotionally Abused:   Marland Kitchen Physically Abused:   . Sexually Abused:    Family History  Problem Relation Age of Onset  . Alcohol abuse Mother   . Depression Mother   . Anxiety disorder Mother   . Paranoid behavior Mother   . Hypertension Mother   . Alcohol abuse Father   . Hypertension Father   . Physical abuse Brother   . Colon cancer Neg Hx   . ADD / ADHD Neg Hx   .  Bipolar disorder Neg Hx   . Dementia Neg Hx   . OCD Neg Hx   . Drug abuse Neg Hx   . Schizophrenia Neg Hx   . Seizures Neg Hx   . Sexual abuse Neg Hx     OBJECTIVE:  Vitals:   10/14/19 1541  BP: 131/88  Pulse: (!) 108  Resp: 18  Temp: 98.1 F (36.7 C)  SpO2: 95%    General appearance: ALERT; in no acute distress.  Head: NCAT Lungs: Normal respiratory effort Musculoskeletal: LT knee Inspection: Skin warm, dry, clear and intact without obvious erythema, effusion, or ecchymosis.  Palpation: TTP over lateral joint line, patellar tendon, and anteromedial aspect ROM: LROM abou the knee Strength: 5/5 knee abduction, 5/5 knee adduction, 4+/5 knee flexion, 4+/5 knee extension Stability: Anterior/ posterior drawer intact Skin: warm and dry Neurologic: Ambulates with antalgic gait; Sensation intact about the lower extremities Psychological: alert and cooperative; normal mood and affect   ASSESSMENT & PLAN:  1. Acute pain of left knee   2. Sprain of left knee, unspecified ligament, initial encounter   3. Lateral joint line tenderness of knee, left      Meds ordered this encounter  Medications  . meloxicam (MOBIC) 15 MG tablet    Sig: Take 1 tablet (15 mg total) by mouth daily.    Dispense:  30 tablet    Refill:  0    Order Specific Question:   Supervising Provider    Answer:   Raylene Everts [[id number]]    Continue  conservative management of rest, ice, and elevation Take mobic as needed for pain relief (may cause abdominal discomfort, ulcers, and GI bleeds avoid taking with other NSAIDs) Follow up with orthopedist if symptoms do not improve over the course of the week, you may need to have an MRI Return or go to the ER if you have any new or worsening symptoms (fever, chills, chest pain, redness, swelling, bruising, etc...)    Reviewed expectations re: course of current medical issues. Questions answered. Outlined signs and symptoms indicating need for more acute intervention. Patient verbalized understanding. After Visit Summary given.    Lestine Box, PA-C 10/14/19 1557

## 2019-10-14 NOTE — ED Triage Notes (Signed)
Pt presents with left knee pain after dancing last night

## 2019-10-17 NOTE — Patient Instructions (Signed)
Your procedure is scheduled on: 10/23/2019  Report to [facility] at  10:45   AM.  Call this number if you have problems the morning of surgery: [phone]   Remember:              Follow Directions on the letter you received from Your Physician's office regarding the Bowel Prep              No Smoking the day of Procedure :   Take these medicines the morning of surgery with A SIP OF WATER: Atomoxetine, protonix and valium if needed   Do not wear jewelry, make-up or nail polish.    Do not bring valuables to the hospital.  Contacts, dentures or bridgework may not be worn into surgery.  .   Patients discharged the day of surgery will not be allowed to drive home.     Colonoscopy, Adult, Care After This sheet gives you information about how to care for yourself after your procedure. Your health care provider may also give you more specific instructions. If you have problems or questions, contact your health care provider. What can I expect after the procedure? After the procedure, it is common to have:  A small amount of blood in your stool for 24 hours after the procedure.  Some gas.  Mild abdominal cramping or bloating.  Follow these instructions at home: General instructions   For the first 24 hours after the procedure: ? Do not drive or use machinery. ? Do not sign important documents. ? Do not drink alcohol. ? Do your regular daily activities at a slower pace than normal. ? Eat soft, easy-to-digest foods. ? Rest often.  Take over-the-counter or prescription medicines only as told by your health care provider.  It is up to you to get the results of your procedure. Ask your health care provider, or the department performing the procedure, when your results will be ready. Relieving cramping and bloating  Try walking around when you have cramps or feel bloated.  Apply heat to your abdomen as told by your health care provider. Use a heat source that your health care  provider recommends, such as a moist heat pack or a heating pad. ? Place a towel between your skin and the heat source. ? Leave the heat on for 20-30 minutes. ? Remove the heat if your skin turns bright red. This is especially important if you are unable to feel pain, heat, or cold. You may have a greater risk of getting burned. Eating and drinking  Drink enough fluid to keep your urine clear or pale yellow.  Resume your normal diet as instructed by your health care provider. Avoid heavy or fried foods that are hard to digest.  Avoid drinking alcohol for as long as instructed by your health care provider. Contact a health care provider if:  You have blood in your stool 2-3 days after the procedure. Get help right away if:  You have more than a small spotting of blood in your stool.  You pass large blood clots in your stool.  Your abdomen is swollen.  You have nausea or vomiting.  You have a fever.  You have increasing abdominal pain that is not relieved with medicine. This information is not intended to replace advice given to you by your health care provider. Make sure you discuss any questions you have with your health care provider. Document Released: 12/02/2003 Document Revised: 01/12/2016 Document Reviewed: 07/01/2015 Elsevier Interactive Patient Education  2018 [place].  Upper Endoscopy, Adult, Care After This sheet gives you information about how to care for yourself after your procedure. Your health care provider may also give you more specific instructions. If you have problems or questions, contact your health care provider. What can I expect after the procedure? After the procedure, it is common to have:  A sore throat.  Mild stomach pain or discomfort.  Bloating.  Nausea. Follow these instructions at home:   Follow instructions from your health care provider about what to eat or drink after your procedure.  Return to your normal activities as told by  your health care provider. Ask your health care provider what activities are safe for you.  Take over-the-counter and prescription medicines only as told by your health care provider.  Do not drive for 24 hours if you were given a sedative during your procedure.  Keep all follow-up visits as told by your health care provider. This is important. Contact a health care provider if you have:  A sore throat that lasts longer than one day.  Trouble swallowing. Get help right away if:  You vomit blood or your vomit looks like coffee grounds.  You have: ? A fever. ? Bloody, black, or tarry stools. ? A severe sore throat or you cannot swallow. ? Difficulty breathing. ? Severe pain in your chest or abdomen. Summary  After the procedure, it is common to have a sore throat, mild stomach discomfort, bloating, and nausea.  Do not drive for 24 hours if you were given a sedative during the procedure.  Follow instructions from your health care provider about what to eat or drink after your procedure.  Return to your normal activities as told by your health care provider. This information is not intended to replace advice given to you by your health care provider. Make sure you discuss any questions you have with your health care provider. Document Revised: 10/11/2017 Document Reviewed: 09/19/2017 Elsevier Patient Education  [place].

## 2019-10-22 ENCOUNTER — Other Ambulatory Visit (HOSPITAL_COMMUNITY)
Admission: RE | Admit: 2019-10-22 | Discharge: 2019-10-22 | Disposition: A | Discharge status: Home / Self Care | Payer: BC Managed Care – PPO | Source: Ambulatory Visit | Attending: Internal Medicine | Admitting: Internal Medicine

## 2019-10-22 ENCOUNTER — Other Ambulatory Visit: Payer: Self-pay

## 2019-10-22 ENCOUNTER — Encounter (HOSPITAL_COMMUNITY)
Admission: RE | Admit: 2019-10-22 | Discharge: 2019-10-22 | Disposition: A | Discharge status: Home / Self Care | Payer: BC Managed Care – PPO | Source: Ambulatory Visit | Attending: Internal Medicine | Admitting: Internal Medicine

## 2019-10-22 ENCOUNTER — Telehealth: Payer: Self-pay | Admitting: Internal Medicine

## 2019-10-22 DIAGNOSIS — Z01812 Encounter for preprocedural laboratory examination: Secondary | ICD-10-CM | POA: Diagnosis not present

## 2019-10-22 DIAGNOSIS — Z20822 Contact with and (suspected) exposure to covid-19: Secondary | ICD-10-CM | POA: Insufficient documentation

## 2019-10-22 NOTE — Telephone Encounter (Signed)
Lmom for pt to call me back. 

## 2019-10-22 NOTE — Telephone Encounter (Addendum)
Pt called back and said that she ended up going through with the Covid test and that she was preparing for procedure tomorrow.  Called Day Surgery and was informed that swab hit the floor when she pushed the nurse's hand away so they had to discard it.  Pamala Hurry said if pt could be there by 4:00 today to do another Covid test, then we could proceed with procedure tomorrow.  Called pt back and informed her of what happened and she agreed to be there at 3:00 today for Covid test.  Made Barbara in Endo aware.

## 2019-10-22 NOTE — Telephone Encounter (Signed)
Short stay called and said that patient "pushed nurse hand away" said that she was not doing the covid test because she did not believe in that

## 2019-10-23 ENCOUNTER — Other Ambulatory Visit: Payer: Self-pay

## 2019-10-23 ENCOUNTER — Ambulatory Visit (HOSPITAL_COMMUNITY): Payer: BC Managed Care – PPO | Admitting: Anesthesiology

## 2019-10-23 ENCOUNTER — Ambulatory Visit (HOSPITAL_COMMUNITY)
Admission: RE | Admit: 2019-10-23 | Discharge: 2019-10-23 | Disposition: A | Discharge status: Home / Self Care | Payer: BC Managed Care – PPO | Attending: Internal Medicine | Admitting: Internal Medicine

## 2019-10-23 ENCOUNTER — Encounter (HOSPITAL_COMMUNITY)
Admission: RE | Disposition: A | Discharge status: Home / Self Care | Payer: Self-pay | Source: Home / Self Care | Attending: Internal Medicine

## 2019-10-23 ENCOUNTER — Encounter (HOSPITAL_COMMUNITY): Payer: Self-pay | Admitting: Internal Medicine

## 2019-10-23 DIAGNOSIS — F329 Major depressive disorder, single episode, unspecified: Secondary | ICD-10-CM | POA: Diagnosis not present

## 2019-10-23 DIAGNOSIS — Z86010 Personal history of colonic polyps: Secondary | ICD-10-CM | POA: Insufficient documentation

## 2019-10-23 DIAGNOSIS — G43909 Migraine, unspecified, not intractable, without status migrainosus: Secondary | ICD-10-CM | POA: Insufficient documentation

## 2019-10-23 DIAGNOSIS — Z791 Long term (current) use of non-steroidal anti-inflammatories (NSAID): Secondary | ICD-10-CM | POA: Diagnosis not present

## 2019-10-23 DIAGNOSIS — R1310 Dysphagia, unspecified: Secondary | ICD-10-CM | POA: Diagnosis not present

## 2019-10-23 DIAGNOSIS — D120 Benign neoplasm of cecum: Secondary | ICD-10-CM

## 2019-10-23 DIAGNOSIS — Z1211 Encounter for screening for malignant neoplasm of colon: Secondary | ICD-10-CM | POA: Insufficient documentation

## 2019-10-23 DIAGNOSIS — K635 Polyp of colon: Secondary | ICD-10-CM | POA: Insufficient documentation

## 2019-10-23 DIAGNOSIS — D124 Benign neoplasm of descending colon: Secondary | ICD-10-CM

## 2019-10-23 DIAGNOSIS — Z79899 Other long term (current) drug therapy: Secondary | ICD-10-CM | POA: Diagnosis not present

## 2019-10-23 DIAGNOSIS — K5730 Diverticulosis of large intestine without perforation or abscess without bleeding: Secondary | ICD-10-CM | POA: Diagnosis not present

## 2019-10-23 DIAGNOSIS — F419 Anxiety disorder, unspecified: Secondary | ICD-10-CM | POA: Insufficient documentation

## 2019-10-23 DIAGNOSIS — M1990 Unspecified osteoarthritis, unspecified site: Secondary | ICD-10-CM | POA: Insufficient documentation

## 2019-10-23 DIAGNOSIS — K219 Gastro-esophageal reflux disease without esophagitis: Secondary | ICD-10-CM | POA: Diagnosis not present

## 2019-10-23 DIAGNOSIS — M797 Fibromyalgia: Secondary | ICD-10-CM | POA: Insufficient documentation

## 2019-10-23 HISTORY — PX: COLONOSCOPY WITH PROPOFOL: SHX5780

## 2019-10-23 HISTORY — PX: MALONEY DILATION: SHX5535

## 2019-10-23 HISTORY — PX: POLYPECTOMY: SHX5525

## 2019-10-23 HISTORY — PX: ESOPHAGOGASTRODUODENOSCOPY (EGD) WITH PROPOFOL: SHX5813

## 2019-10-23 LAB — SARS CORONAVIRUS 2 (TAT 6-24 HRS): SARS Coronavirus 2: NEGATIVE

## 2019-10-23 LAB — BASIC METABOLIC PANEL
Anion gap: 9 (ref 5–15)
BUN: 8 mg/dL (ref 6–20)
CO2: 24 mmol/L (ref 22–32)
Calcium: 8.2 mg/dL — ABNORMAL LOW (ref 8.9–10.3)
Chloride: 108 mmol/L (ref 98–111)
Creatinine, Ser: 0.66 mg/dL (ref 0.44–1.00)
GFR calc Af Amer: >60 mL/min (ref >60)
GFR calc non Af Amer: >60 mL/min (ref >60)
Glucose, Bld: 109 mg/dL — ABNORMAL HIGH (ref 70–99)
Potassium: 4 mmol/L (ref 3.5–5.1)
Sodium: 141 mmol/L (ref 135–145)

## 2019-10-23 SURGERY — COLONOSCOPY WITH PROPOFOL
Anesthesia: General

## 2019-10-23 MED ORDER — CHLORHEXIDINE GLUCONATE CLOTH 2 % EX PADS: 6.0000 | MEDICATED_PAD | Freq: Once | CUTANEOUS | Status: DC

## 2019-10-23 MED ORDER — PROPOFOL 10 MG/ML IV BOLUS
INTRAVENOUS | Status: AC
  Filled 2019-10-23: qty 80

## 2019-10-23 MED ORDER — LACTATED RINGERS IV SOLN: Freq: Once | INTRAVENOUS | Status: AC

## 2019-10-23 MED ORDER — GLYCOPYRROLATE 0.2 MG/ML IJ SOLN
0.2000 mg | Freq: Once | INTRAMUSCULAR | Status: AC
  Administered 2019-10-23: 0.2 mg via INTRAVENOUS

## 2019-10-23 MED ORDER — PROPOFOL 500 MG/50ML IV EMUL
INTRAVENOUS | Status: DC | PRN
  Administered 2019-10-23: 120 mg via INTRAVENOUS
  Administered 2019-10-23: 150 ug/kg/min via INTRAVENOUS

## 2019-10-23 MED ORDER — LIDOCAINE VISCOUS HCL 2 % MT SOLN
OROMUCOSAL | Status: AC
  Filled 2019-10-23: qty 15

## 2019-10-23 MED ORDER — STERILE WATER FOR IRRIGATION IR SOLN
Status: DC | PRN
  Administered 2019-10-23: 200 mL

## 2019-10-23 MED ORDER — LIDOCAINE HCL (CARDIAC) PF 50 MG/5ML IV SOSY
PREFILLED_SYRINGE | INTRAVENOUS | Status: DC | PRN
  Administered 2019-10-23: 60 mg via INTRAVENOUS

## 2019-10-23 MED ORDER — LIDOCAINE VISCOUS HCL 2 % MT SOLN
15.0000 mL | Freq: Once | OROMUCOSAL | Status: AC
  Administered 2019-10-23: 15 mL via OROMUCOSAL

## 2019-10-23 MED ORDER — GLYCOPYRROLATE 0.2 MG/ML IJ SOLN
INTRAMUSCULAR | Status: AC
  Filled 2019-10-23: qty 1

## 2019-10-23 MED ORDER — LACTATED RINGERS IV SOLN
INTRAVENOUS | Status: DC | PRN
Start: 2019-10-23 — End: 2019-10-23

## 2019-10-23 NOTE — Anesthesia Postprocedure Evaluation (Signed)
Anesthesia Post Note  Patient: Theresa [name]  Procedure(s) Performed: COLONOSCOPY WITH PROPOFOL (N/A ) ESOPHAGOGASTRODUODENOSCOPY (EGD) WITH PROPOFOL (N/A ) MALONEY DILATION (N/A ) POLYPECTOMY  Patient location during evaluation: PACU Anesthesia Type: General Level of consciousness: awake, oriented, awake and alert and patient cooperative Pain management: pain level controlled Vital Signs Assessment: post-procedure vital signs reviewed and stable Respiratory status: spontaneous breathing, nonlabored ventilation and respiratory function stable Cardiovascular status: blood pressure returned to baseline and stable Postop Assessment: no headache and no backache Anesthetic complications: no   No complications documented.   Last Vitals:  Vitals:   10/23/19 1125  BP: 127/88  Pulse: 81  Resp: 19  Temp: 36.8 C  SpO2: 98%    Last Pain:  Vitals:   10/23/19 1340  TempSrc:   PainSc: 0-No pain                 Tacy Learn

## 2019-10-23 NOTE — Op Note (Signed)
[facility] [name] Procedure Date: 10/23/2019 12:56 PM MRN: [id number] Date of Birth: 1970-10-13 Attending MD: Norvel Richards , MD CSN: [id number] Age: 49 Admit Type: Outpatient Procedure:                Upper GI endoscopy Indications:              Dysphagia Providers:                Norvel Richards, MD, Crystal Page, Rosina Lowenstein, RN, Nelma Rothman, Technician Referring MD:              Medicines:                Propofol per Anesthesia Complications:            No immediate complications. Estimated Blood Loss:     Estimated blood loss: none. Estimated blood loss:                            none. Procedure:                Pre-Anesthesia Assessment:                           - Prior to the procedure, a History and Physical                            was performed, and patient medications and                            allergies were reviewed. The patient's tolerance of                            previous anesthesia was also reviewed. The risks                            and benefits of the procedure and the sedation                            options and risks were discussed with the patient.                            All questions were answered, and informed consent                            was obtained. Prior Anticoagulants: The patient has                            taken no previous anticoagulant or antiplatelet                            agents. ASA Grade Assessment: II - A patient with  mild systemic disease. After reviewing the risks                            and benefits, the patient was deemed in                            satisfactory condition to undergo the procedure.                           After obtaining informed consent, the endoscope was                            passed under direct vision. Throughout the                            procedure, the patient's blood pressure,  pulse, and                            oxygen saturations were monitored continuously. The                            [id number]-[id number] ([id number]) scope was introduced through the                            mouth, and advanced to the second part of duodenum.                            The upper GI endoscopy was accomplished without                            difficulty. The patient tolerated the procedure                            well. Scope In: 1:12:21 PM Scope Out: 1:18:31 PM Total Procedure Duration: 0 hours 6 minutes 10 seconds  Findings:      The examined esophagus was normal.      The entire examined stomach was normal aside from a minimal polypoid       appearance.      The duodenal bulb and second portion of the duodenum were normal. The       scope was withdrawn. Dilation was performed with a Maloney dilator with       no resistance at 64 Fr. The scope was withdrawn. Dilation was performed       with a Maloney dilator with mild resistance at 56 Fr. The dilation site       was examined following endoscope reinsertion and showed no change.       Estimated blood loss: none. Impression:               - Normal esophagus -status post Maloney dilation.                           - Normal stomach.                           - Normal  duodenal bulb and second portion of the                            duodenum.                           - No specimens collected. Moderate Sedation:      Moderate (conscious) sedation was personally administered by an       anesthesia professional. The following parameters were monitored: oxygen       saturation, heart rate, blood pressure, respiratory rate, EKG, adequacy       of pulmonary ventilation, and response to care. Recommendation:           - Patient has a contact number available for                            emergencies. The signs and symptoms of potential                            delayed complications were discussed with the                             patient. Return to normal activities tomorrow.                            Written discharge instructions were provided to the                            patient.                           - Resume previous diet.                           - Continue present medications. Continue Protonix                            40 mg daily                           - Return to my office in 3 months. See colonoscopy                            report. Procedure Code(s):        --- Professional ---                           [phone], Esophagogastroduodenoscopy, flexible,                            transoral; diagnostic, including collection of                            specimen(s) by brushing or washing, when performed                            (separate procedure)  43450, Dilation of esophagus, by unguided sound or                            bougie, single or multiple passes Diagnosis Code(s):        --- Professional ---                           R13.10, Dysphagia, unspecified CPT copyright 2019 American Medical Association. All rights reserved. The codes documented in this report are preliminary and upon coder review may  be revised to meet current compliance requirements. Cristopher Estimable. Aryeh Butterfield, MD Norvel Richards, MD 10/23/2019 1:23:25 PM This report has been signed electronically. Number of Addenda: 0

## 2019-10-23 NOTE — Anesthesia Preprocedure Evaluation (Addendum)
Anesthesia Evaluation  Patient identified by MRN, date of birth, ID band Patient awake    Reviewed: Allergy & Precautions, NPO status , Patient's Chart, lab work & pertinent test results  Airway Mallampati: II  TM Distance: >3 FB Neck ROM: Full    Dental  (+) Dental Advisory Given, Caps   Pulmonary    Pulmonary exam normal breath sounds clear to auscultation       Cardiovascular Exercise Tolerance: Good (-) hypertensionNormal cardiovascular exam+ dysrhythmias (h/o palpitations and skipped beats) + Valvular Problems/Murmurs AS  Rhythm:Regular Rate:Normal     Neuro/Psych  Headaches, Seizures - (childhood seizures, not on meds), Well Controlled,  PSYCHIATRIC DISORDERS Anxiety Depression  Neuromuscular disease    GI/Hepatic Neg liver ROS, GERD  Medicated and Controlled,  Endo/Other  negative endocrine ROS  Renal/GU negative Renal ROS     Musculoskeletal  (+) Arthritis , Fibromyalgia -  Abdominal   Peds  Hematology   Anesthesia Other Findings   Reproductive/Obstetrics                            Anesthesia Physical Anesthesia Plan  ASA: II  Anesthesia Plan: General   Post-op Pain Management:    Induction: Intravenous  PONV Risk Score and Plan: 2 and TIVA  Airway Management Planned: Nasal Cannula, Natural Airway and Simple Face Mask  Additional Equipment:   Intra-op Plan:   Post-operative Plan:   Informed Consent: I have reviewed the patients History and Physical, chart, labs and discussed the procedure including the risks, benefits and alternatives for the proposed anesthesia with the patient or authorized representative who has indicated his/her understanding and acceptance.     Dental advisory given  Plan Discussed with: CRNA and Surgeon  Anesthesia Plan Comments:        Anesthesia Quick Evaluation

## 2019-10-23 NOTE — Op Note (Signed)
[facility] [name] Procedure Date: 10/23/2019 1:18 PM MRN: [id number] Date of Birth: [birth year] Attending MD: Norvel Richards , MD CSN: [id number] Age: 49 Admit Type: Outpatient Procedure:                Colonoscopy Indications:              High risk colon cancer surveillance: Personal                            history of colonic polyps Providers:                Norvel Richards, MD, Crystal Page, Rosina Lowenstein, RN, Nelma Rothman, Technician Referring MD:              Medicines:                Propofol per Anesthesia Complications:            No immediate complications. Estimated Blood Loss:     Estimated blood loss was minimal. Procedure:                Pre-Anesthesia Assessment:                           - Prior to the procedure, a History and Physical                            was performed, and patient medications and                            allergies were reviewed. The patient's tolerance of                            previous anesthesia was also reviewed. The risks                            and benefits of the procedure and the sedation                            options and risks were discussed with the patient.                            All questions were answered, and informed consent                            was obtained. Prior Anticoagulants: The patient has                            taken no previous anticoagulant or antiplatelet                            agents. ASA Grade Assessment: II - A patient with  mild systemic disease. After reviewing the risks                            and benefits, the patient was deemed in                            satisfactory condition to undergo the procedure.                           After obtaining informed consent, the colonoscope                            was passed under direct vision. Throughout the                            procedure,  the patient's blood pressure, pulse, and                            oxygen saturations were monitored continuously. The                            CF-[id number] ([id number]) scope was introduced through                            the anus and advanced to the the cecum, identified                            by appendiceal orifice and ileocecal valve. The                            colonoscopy was performed without difficulty. The                            patient tolerated the procedure well. The quality                            of the bowel preparation was adequate. Scope In: 1:24:42 PM Scope Out: 1:39:19 PM Scope Withdrawal Time: 0 hours 11 minutes 51 seconds  Total Procedure Duration: 0 hours 14 minutes 37 seconds  Findings:      The perianal and digital rectal examinations were normal.      Scattered small-mouthed diverticula were found in the sigmoid colon and       descending colon.      A 3 mm polyp was found in the cecum. The polyp was sessile. The polyp       was removed with a cold biopsy forceps. Resection and retrieval were       complete. Estimated blood loss was minimal.      A 4 mm polyp was found in the descending colon. The polyp was sessile.       The polyp was removed with a cold snare. Resection and retrieval were       complete. Estimated blood loss was minimal. Impression:               - Diverticulosis in the sigmoid colon and in the  descending colon.                           - One 3 mm polyp in the cecum, removed with a cold                            biopsy forceps. Resected and retrieved.                           - One 4 mm polyp in the descending colon, removed                            with a cold snare. Resected and retrieved. Moderate Sedation:      Moderate (conscious) sedation was personally administered by an       anesthesia professional. The following parameters were monitored: oxygen       saturation, heart rate, blood  pressure, respiratory rate, EKG, adequacy       of pulmonary ventilation, and response to care. Recommendation:           - Patient has a contact number available for                            emergencies. The signs and symptoms of potential                            delayed complications were discussed with the                            patient. Return to normal activities tomorrow.                            Written discharge instructions were provided to the                            patient.                           - Advance diet as tolerated.                           - Continue present medications.                           - Repeat colonoscopy date to be determined after                            pending pathology results are reviewed. Office                            visit with [place] in 3 months. See EGD report. Procedure Code(s):        --- Professional ---                           [phone], Colonoscopy, flexible; with removal of  tumor(s), polyp(s), or other lesion(s) by snare                            technique                           45380, 59, Colonoscopy, flexible; with biopsy,                            single or multiple Diagnosis Code(s):        --- Professional ---                           Z86.010, Personal history of colonic polyps                           K63.5, Polyp of colon                           K57.30, Diverticulosis of large intestine without                            perforation or abscess without bleeding CPT copyright 2019 American Medical Association. All rights reserved. The codes documented in this report are preliminary and upon coder review may  be revised to meet current compliance requirements. Cristopher Estimable. Beonka Amesquita, MD Norvel Richards, MD 10/23/2019 1:44:25 PM This report has been signed electronically. Number of Addenda: 0

## 2019-10-23 NOTE — Transfer of Care (Signed)
Immediate Anesthesia Transfer of Care Note  Patient: Theresa [name]  Procedure(s) Performed: COLONOSCOPY WITH PROPOFOL (N/A ) ESOPHAGOGASTRODUODENOSCOPY (EGD) WITH PROPOFOL (N/A ) MALONEY DILATION (N/A ) POLYPECTOMY  Patient Location: PACU  Anesthesia Type:MAC  Level of Consciousness: awake, alert , oriented and patient cooperative  Airway & Oxygen Therapy: Patient Spontanous Breathing and Patient connected to nasal cannula oxygen  Post-op Assessment: Report given to RN, Post -op Vital signs reviewed and stable and Patient moving all extremities  Post vital signs: Reviewed and stable  Last Vitals:  Vitals Value Taken Time  BP    Temp    Pulse    Resp    SpO2      Last Pain:  Vitals:   10/23/19 1340  TempSrc:   PainSc: 0-No pain         Complications: No complications documented.

## 2019-10-23 NOTE — Discharge Instructions (Signed)
Colonoscopy Discharge Instructions  Read the instructions outlined below and refer to this sheet in the next few weeks. These discharge instructions provide you with general information on caring for yourself after you leave the hospital. Your doctor may also give you specific instructions. While your treatment has been planned according to the most current medical practices available, unavoidable complications occasionally occur. If you have any problems or questions after discharge, call Dr. Gala Romney at [phone]. ACTIVITY  You may resume your regular activity, but move at a slower pace for the next 24 hours.   Take frequent rest periods for the next 24 hours.   Walking will help get rid of the air and reduce the bloated feeling in your belly (abdomen).   No driving for 24 hours (because of the medicine (anesthesia) used during the test).    Do not sign any important legal documents or operate any machinery for 24 hours (because of the anesthesia used during the test).  NUTRITION  Drink plenty of fluids.   You may resume your normal diet as instructed by your doctor.   Begin with a light meal and progress to your normal diet. Heavy or fried foods are harder to digest and may make you feel sick to your stomach (nauseated).   Avoid alcoholic beverages for 24 hours or as instructed.  MEDICATIONS  You may resume your normal medications unless your doctor tells you otherwise.  WHAT YOU CAN EXPECT TODAY  Some feelings of bloating in the abdomen.   Passage of more gas than usual.   Spotting of blood in your stool or on the toilet paper.  IF YOU HAD POLYPS REMOVED DURING THE COLONOSCOPY:  No aspirin products for 7 days or as instructed.   No alcohol for 7 days or as instructed.   Eat a soft diet for the next 24 hours.  FINDING OUT THE RESULTS OF YOUR TEST Not all test results are available during your visit. If your test results are not back during the visit, make an appointment  with your caregiver to find out the results. Do not assume everything is normal if you have not heard from your caregiver or the medical facility. It is important for you to follow up on all of your test results.  SEEK IMMEDIATE MEDICAL ATTENTION IF:  You have more than a spotting of blood in your stool.   Your belly is swollen (abdominal distention).   You are nauseated or vomiting.   You have a temperature over 101.   You have abdominal pain or discomfort that is severe or gets worse throughout the day.    EGD Discharge instructions Please read the instructions outlined below and refer to this sheet in the next few weeks. These discharge instructions provide you with general information on caring for yourself after you leave the hospital. Your doctor may also give you specific instructions. While your treatment has been planned according to the most current medical practices available, unavoidable complications occasionally occur. If you have any problems or questions after discharge, please call your doctor. ACTIVITY  You may resume your regular activity but move at a slower pace for the next 24 hours.   Take frequent rest periods for the next 24 hours.   Walking will help expel (get rid of) the air and reduce the bloated feeling in your abdomen.   No driving for 24 hours (because of the anesthesia (medicine) used during the test).   You may shower.   Do not sign  any important legal documents or operate any machinery for 24 hours (because of the anesthesia used during the test).  NUTRITION  Drink plenty of fluids.   You may resume your normal diet.   Begin with a light meal and progress to your normal diet.   Avoid alcoholic beverages for 24 hours or as instructed by your caregiver.  MEDICATIONS  You may resume your normal medications unless your caregiver tells you otherwise.  WHAT YOU CAN EXPECT TODAY  You may experience abdominal discomfort such as a feeling of  fullness or "gas" pains.  FOLLOW-UP  Your doctor will discuss the results of your test with you.  SEEK IMMEDIATE MEDICAL ATTENTION IF ANY OF THE FOLLOWING OCCUR:  Excessive nausea (feeling sick to your stomach) and/or vomiting.   Severe abdominal pain and distention (swelling).   Trouble swallowing.   Temperature over 101 F (37.8 C).   Rectal bleeding or vomiting of blood.   Your esophagus was stretched today.  You should swallow better.  Advance her diet as tolerated  Continue Protonix 40 mg daily  2 polyps removed from your colon.  Further recommendations to follow pending review of pathology report  At patient request I called Vicente Males at [phone]  Office follow-up with [place] in 3 months  PATIENT INSTRUCTIONS POST-ANESTHESIA  IMMEDIATELY FOLLOWING SURGERY:  Do not drive or operate machinery for the first twenty four hours after surgery.  Do not make any important decisions for twenty four hours after surgery or while taking narcotic pain medications or sedatives.  If you develop intractable nausea and vomiting or a severe headache please notify your doctor immediately.  FOLLOW-UP:  Please make an appointment with your surgeon as instructed. You do not need to follow up with anesthesia unless specifically instructed to do so.  WOUND CARE INSTRUCTIONS (if applicable):  Keep a dry clean dressing on the anesthesia/puncture wound site if there is drainage.  Once the wound has quit draining you may leave it open to air.  Generally you should leave the bandage intact for twenty four hours unless there is drainage.  If the epidural site drains for more than 36-48 hours please call the anesthesia department.  QUESTIONS?:  Please feel free to call your physician or the hospital operator if you have any questions, and they will be happy to assist you.      Colon Polyps  Polyps are tissue growths inside the body. Polyps can grow in many places, including the large intestine (colon).  A polyp may be a round bump or a mushroom-shaped growth. You could have one polyp or several. Most colon polyps are noncancerous (benign). However, some colon polyps can become cancerous over time. Finding and removing the polyps early can help prevent this. What are the causes? The exact cause of colon polyps is not known. What increases the risk? You are more likely to develop this condition if you:  Have a family history of colon cancer or colon polyps.  Are older than 42 or older than 45 if you are African American.  Have inflammatory bowel disease, such as ulcerative colitis or Crohn's disease.  Have certain hereditary conditions, such as: ? Familial adenomatous polyposis. ? Lynch syndrome. ? Turcot syndrome. ? Peutz-Jeghers syndrome.  Are overweight.  Smoke cigarettes.  Do not get enough exercise.  Drink too much alcohol.  Eat a diet that is high in fat and red meat and low in fiber.  Had childhood cancer that was treated with abdominal radiation. What are  the signs or symptoms? Most polyps do not cause symptoms. If you have symptoms, they may include:  Blood coming from your rectum when having a bowel movement.  Blood in your stool. The stool may look dark red or black.  Abdominal pain.  A change in bowel habits, such as constipation or diarrhea. How is this diagnosed? This condition is diagnosed with a colonoscopy. This is a procedure in which a lighted, flexible scope is inserted into the anus and then passed into the colon to examine the area. Polyps are sometimes found when a colonoscopy is done as part of routine cancer screening tests. How is this treated? Treatment for this condition involves removing any polyps that are found. Most polyps can be removed during a colonoscopy. Those polyps will then be tested for cancer. Additional treatment may be needed depending on the results of testing. Follow these instructions at home: Lifestyle  Maintain a healthy  weight, or lose weight if recommended by your health care provider.  Exercise every day or as told by your health care provider.  Do not use any products that contain nicotine or tobacco, such as cigarettes and e-cigarettes. If you need help quitting, ask your health care provider.  If you drink alcohol, limit how much you have: ? 0-1 drink a day for women. ? 0-2 drinks a day for men.  Be aware of how much alcohol is in your drink. In the [place] one drink equals one 12 oz bottle of beer (355 mL), one 5 oz glass of wine (148 mL), or one 1 oz shot of hard liquor (44 mL). Eating and drinking   Eat foods that are high in fiber, such as fruits, vegetables, and whole grains.  Eat foods that are high in calcium and vitamin D, such as milk, cheese, yogurt, eggs, liver, fish, and broccoli.  Limit foods that are high in fat, such as fried foods and desserts.  Limit the amount of red meat and processed meat you eat, such as hot dogs, sausage, bacon, and lunch meats. General instructions  Keep all follow-up visits as told by your health care provider. This is important. ? This includes having regularly scheduled colonoscopies. ? Talk to your health care provider about when you need a colonoscopy. Contact a health care provider if:  You have new or worsening bleeding during a bowel movement.  You have new or increased blood in your stool.  You have a change in bowel habits.  You lose weight for no known reason. Summary  Polyps are tissue growths inside the body. Polyps can grow in many places, including the colon.  Most colon polyps are noncancerous (benign), but some can become cancerous over time.  This condition is diagnosed with a colonoscopy.  Treatment for this condition involves removing any polyps that are found. Most polyps can be removed during a colonoscopy. This information is not intended to replace advice given to you by your health care provider. Make sure you discuss  any questions you have with your health care provider. Document Revised: 08/04/2017 Document Reviewed: 08/04/2017 Elsevier Patient Education  [place].

## 2019-10-23 NOTE — H&P (Signed)
'@LOGO' @   Primary Care Physician:  Kathyrn Drown, MD Primary Gastroenterologist:  Dr. Gala Romney  Pre-Procedure History & Physical: HPI:  Theresa [name] is a 49 y.o. female here for further evaluation of dysphagia.  History of colonic polyps overdue for surveillance colonoscopy  Past Medical History:  Diagnosis Date  . Anxiety   . Depression   . Fibromyalgia   . GERD (gastroesophageal reflux disease)   . Headache(784.0)   . Heart murmur   . IFG (impaired fasting glucose)   . Obsessive-compulsive disorder   . Osteoarthritis   . Seizures ([place])    last seizure at age 72-stressed induced seizure.    Past Surgical History:  Procedure Laterality Date  . ABDOMINAL HYSTERECTOMY     still has right ovary  . BIOPSY N/A 03/05/2014   Procedure: DUODENAL AND GASTRIC BIOPSY;  Surgeon: Danie Binder, MD;  Location: [facility];  Service: Endoscopy;  Laterality: N/A;  . bladder sling X 3    . CHOLECYSTECTOMY    . COLONOSCOPY  04/24/2012   FMB:[id number] polyp measuring 5 mm in size was found in the proximal transverse colon; polypectomy was performed with cold forceps Pedunculated polyp measuring 1.1 cm in size was found in the sigmoid colon; polypectomy was performed using snare cautery Moderate diverticulosis was noted in the descending colon and sigmoid colon small internal hemorrhoids. next tcs 04/2017  . ESOPHAGOGASTRODUODENOSCOPY N/A 02/18/2014   Procedure: ESOPHAGOGASTRODUODENOSCOPY (EGD);  Surgeon: Danie Binder, MD;  Location: [facility] SUITE;  Service: Endoscopy;  Laterality: N/A;  115  . ESOPHAGOGASTRODUODENOSCOPY (EGD) WITH PROPOFOL N/A 03/05/2014   SLF: 1. dyspepsia due to constipation, gastritis, and GERD 2. Multiple Gastric polyps 3. Moderate non-erosive gastritis 4. Diverticulum in the 2nd part of the duodenum  . TUBAL LIGATION      Prior to Admission medications   Medication Sig Start Date End Date Taking? Authorizing Provider  atomoxetine (STRATTERA) 40 MG capsule Take 1 capsule (40  mg total) by mouth daily. 10/04/19  Yes Nilda Simmer, NP  estradiol (ESTRACE) 1 MG tablet Take 1 tablet (1 mg total) by mouth daily. 07/13/19 07/12/20 Yes Estill Dooms, NP  hydrochlorothiazide (HYDRODIURIL) 25 MG tablet Take 1 tablet daily q AM PRN Patient taking differently: Take 25 mg by mouth daily as needed (fluid). Take 1 tablet daily q AM PRN 08/16/19  Yes Luking, Elayne Snare, MD  Magnesium 400 MG CAPS Take 400 mg by mouth daily.    Yes [provider]  meloxicam (MOBIC) 15 MG tablet Take 1 tablet (15 mg total) by mouth daily. 10/14/19  Yes Wurst, [place], PA-C  Multiple Vitamin (MULTIVITAMIN) tablet Take 1 tablet by mouth daily.   Yes [provider]  pantoprazole (PROTONIX) 40 MG tablet Take 1 tablet (40 mg total) by mouth daily. 08/28/19  Yes Carlis Stable, NP  potassium chloride (KLOR-CON) 10 MEQ tablet Take 10 mEq by mouth daily as needed (when taking HCTZ).   Yes [provider]  diazepam (VALIUM) 5 MG tablet TAKE 1/2 TO 1 TABLET BY MOUTH ONCE DAILY AS NEEDED FOR ANXIETY 10/12/19   Nilda Simmer, NP  Na Sulfate-K Sulfate-Mg Sulf (SUPREP BOWEL PREP KIT) 17.5-3.13-1.6 GM/177ML SOLN Take 1 kit by mouth as directed. Patient not taking: Reported on 10/11/2019 08/28/19   Daneil Dolin, MD  Potassium 99 MG TABS Take 99 mg by mouth daily.    [provider]  rizatriptan (MAXALT-MLT) 10 MG disintegrating tablet TAKE 1 TABLET AS NEEDED FOR  MIGRAIN, MAY REPEAT IN 2 HOURS MAX 2/24 HOURS Patient taking differently: Take 10 mg by mouth See admin instructions. TAKE 1 TABLET AS NEEDED FOR MIGRAIN, MAY REPEAT IN 2 HOURS MAX 2/24 HOURS 03/19/19   Kathyrn Drown, MD  buPROPion (WELLBUTRIN XL) 150 MG 24 hr tablet Take one tab qam Patient not taking: Reported on 03/19/2019 01/12/19 03/28/19  Nilda Simmer, NP  cetirizine (ZYRTEC) 10 MG tablet Take 1 tablet (10 mg total) by mouth daily. Patient not taking: Reported on 10/11/2019 09/29/19 10/14/19  Wurst, [place],  PA-C  dexlansoprazole (DEXILANT) 60 MG capsule Take 1 capsule (60 mg total) by mouth daily. Prn acid reflux Patient not taking: Reported on 10/11/2019 05/24/19 10/14/19  Nilda Simmer, NP  fluticasone [facility]) 50 MCG/ACT nasal spray Place 2 sprays into both nostrils daily. Patient not taking: Reported on 10/11/2019 09/29/19 10/14/19  Lestine Box, PA-C    Allergies as of 08/28/2019  . (No Known Allergies)    Family History  Problem Relation Age of Onset  . Alcohol abuse Mother   . Depression Mother   . Anxiety disorder Mother   . Paranoid behavior Mother   . Hypertension Mother   . Alcohol abuse Father   . Hypertension Father   . Physical abuse Brother   . Colon cancer Neg Hx   . ADD / ADHD Neg Hx   . Bipolar disorder Neg Hx   . Dementia Neg Hx   . OCD Neg Hx   . Drug abuse Neg Hx   . Schizophrenia Neg Hx   . Seizures Neg Hx   . Sexual abuse Neg Hx     Social History   Socioeconomic History  . Marital status: Single    Spouse name: Not on file  . Number of children: 2  . Years of education: Not on file  . Highest education level: Not on file  Occupational History  . Occupation: OFFICE MGR    Employer: [employer]    Comment: Radio producer  Tobacco Use  . Smoking status: Never Smoker  . Smokeless tobacco: Never Used  . Tobacco comment: as a kid  Vaping Use  . Vaping Use: Never used  Substance and Sexual Activity  . Alcohol use: Yes    Comment: occas  . Drug use: No  . Sexual activity: Not Currently    Birth control/protection: Surgical  Other Topics Concern  . Not on file  Social History Narrative   Separated from her husband; has 2 children   Social Determinants of Health   Financial Resource Strain:   . Difficulty of Paying Living Expenses:   Food Insecurity:   . Worried About Charity fundraiser in the Last Year:   . Arboriculturist in the Last Year:   Transportation Needs:   . Film/video editor (Medical):   Marland Kitchen Lack of  Transportation (Non-Medical):   Physical Activity:   . Days of Exercise per Week:   . Minutes of Exercise per Session:   Stress:   . Feeling of Stress :   Social Connections:   . Frequency of Communication with Friends and Family:   . Frequency of Social Gatherings with Friends and Family:   . Attends Religious Services:   . Active Member of Clubs or Organizations:   . Attends Archivist Meetings:   Marland Kitchen Marital Status:   Intimate Partner Violence:   . Fear of Current or Ex-Partner:   . Emotionally Abused:   .  Physically Abused:   . Sexually Abused:     Review of Systems: See HPI, otherwise negative ROS  Physical Exam: BP 127/88   Pulse 81   Temp 98.2 F (36.8 C) (Oral)   Resp 19   Ht '5\' 5"'  (1.651 m)   Wt 93 kg   SpO2 98%   BMI 34.11 kg/m  General:   Alert,  Well-developed, well-nourished, pleasant and cooperative in NAD Neck:  Supple; no masses or thyromegaly. No significant cervical adenopathy. Lungs:  Clear throughout to auscultation.   No wheezes, crackles, or rhonchi. No acute distress. Heart:  Regular rate and rhythm; no murmurs, clicks, rubs,  or gallops. Abdomen: Non-distended, normal bowel sounds.  Soft and nontender without appreciable mass or hepatosplenomegaly.  Pulses:  Normal pulses noted. Extremities:  Without clubbing or edema.  Impression/Plan: 49 year old lady with longstanding GERD and now esophageal dysphagia as well as a history of colonic polyps removed previously; overdue for surveillance colonoscopy  I have offered the patient both an EGD and colonoscopy.  Potential for esophageal dilation reviewed. The risks, benefits, limitations, imponderables and alternatives regarding both EGD and colonoscopy have been reviewed with the patient. Questions have been answered. All parties agreeable.      Notice: This dictation was prepared with Dragon dictation along with smaller phrase technology. Any transcriptional errors that result from this  process are unintentional and may not be corrected upon review.

## 2019-10-24 ENCOUNTER — Encounter: Payer: Self-pay | Admitting: Internal Medicine

## 2019-10-24 ENCOUNTER — Telehealth: Payer: Self-pay | Admitting: Internal Medicine

## 2019-10-24 LAB — SURGICAL PATHOLOGY

## 2019-10-24 NOTE — Telephone Encounter (Signed)
Pt said RMR did procedure yesterday and she said that her results were in Mychart and she doesn't understand them. Please call her at [phone]

## 2019-10-24 NOTE — Telephone Encounter (Signed)
Spoke with pt. Letter will be mailed to pt tomorrow 10/25/19 as it has been resulted.

## 2019-10-26 ENCOUNTER — Encounter: Payer: Self-pay | Admitting: Nurse Practitioner

## 2019-10-26 ENCOUNTER — Encounter (HOSPITAL_COMMUNITY): Payer: Self-pay | Admitting: Internal Medicine

## 2019-10-28 ENCOUNTER — Encounter: Payer: Self-pay | Admitting: Family Medicine

## 2019-10-28 DIAGNOSIS — K635 Polyp of colon: Secondary | ICD-10-CM

## 2019-10-28 HISTORY — DX: Polyp of colon: K63.5

## 2019-11-01 ENCOUNTER — Encounter: Payer: Self-pay | Admitting: Nurse Practitioner

## 2019-11-02 NOTE — Telephone Encounter (Signed)
Pt is following up on message sent to [facility] by my chart

## 2019-11-15 ENCOUNTER — Encounter: Payer: Self-pay | Admitting: Nurse Practitioner

## 2019-11-15 ENCOUNTER — Ambulatory Visit: Payer: BC Managed Care – PPO | Admitting: Nurse Practitioner

## 2019-11-15 ENCOUNTER — Other Ambulatory Visit: Payer: Self-pay

## 2019-11-15 VITALS — BP 122/88 | Temp 97.5°F | Wt 208.8 lb

## 2019-11-15 DIAGNOSIS — F988 Other specified behavioral and emotional disorders with onset usually occurring in childhood and adolescence: Secondary | ICD-10-CM | POA: Diagnosis not present

## 2019-11-15 DIAGNOSIS — Z79899 Other long term (current) drug therapy: Secondary | ICD-10-CM | POA: Diagnosis not present

## 2019-11-15 MED ORDER — AMPHETAMINE-DEXTROAMPHET ER 10 MG PO CP24: 10.0000 mg | ORAL_CAPSULE | Freq: Every day | ORAL | 0 refills | Status: DC

## 2019-11-15 NOTE — Progress Notes (Signed)
   Subjective:    Patient ID: Theresa [name], female    DOB: 26-Mar-1971, 49 y.o.   MRN: [id number]  HPI Pt here to discuss ADD. Has always had a problem with concentration and completion of tasks but struggling more lately. Mainly concerned because this is now affecting her job performance. Has tried Wellbutrin and Strattera with no improvement. Concentration is worse although her life is much calmer now. Less anxiety and stress overall.  Has been working hard to lose weight and has done well.   Review of Systems No history of cardiac issues.     Objective:   Physical Exam NAD. Alert, oriented. Cheerful affect. Lungs clear. Heart RRR. No murmur noted.  EKG normal with low voltage.  Today's Vitals   11/15/19 1428  BP: 122/88  Temp: (!) 97.5 F (36.4 C)  Weight: 208 lb 12.8 oz (94.7 kg)   Body mass index is 34.75 kg/m. Top weight here was 241 lbs.        Assessment & Plan:   Problem List Items Addressed This Visit      Other   Attention deficit disorder (ADD) without hyperactivity - Primary    Other Visit Diagnoses    High risk medication use       Relevant Orders   EKG 12-Lead     Meds ordered this encounter  Medications  . amphetamine-dextroamphetamine (ADDERALL XR) 10 MG 24 hr capsule    Sig: Take 1 capsule (10 mg total) by mouth daily.    Dispense:  30 capsule    Refill:  0    Order Specific Question:   Supervising Provider    Answer:   Sallee Lange A [[id number]]   Discussed potential adverse effects associated with Adderall.  Will start with low dose once daily. Patient to contact office within one month to give [place] feedback on this dose. DC med and call if any significant issues.  Return in about 3 months (around 02/15/2020) for ADD check up.

## 2019-11-21 ENCOUNTER — Encounter: Payer: Self-pay | Admitting: Family Medicine

## 2019-12-05 ENCOUNTER — Ambulatory Visit: Payer: BC Managed Care – PPO | Admitting: Nurse Practitioner

## 2019-12-05 DIAGNOSIS — N390 Urinary tract infection, site not specified: Secondary | ICD-10-CM | POA: Diagnosis not present

## 2019-12-05 DIAGNOSIS — Z202 Contact with and (suspected) exposure to infections with a predominantly sexual mode of transmission: Secondary | ICD-10-CM | POA: Diagnosis not present

## 2019-12-05 DIAGNOSIS — B373 Candidiasis of vulva and vagina: Secondary | ICD-10-CM | POA: Diagnosis not present

## 2019-12-05 DIAGNOSIS — R300 Dysuria: Secondary | ICD-10-CM | POA: Diagnosis not present

## 2019-12-11 ENCOUNTER — Encounter: Payer: Self-pay | Admitting: Nurse Practitioner

## 2019-12-11 ENCOUNTER — Telehealth: Payer: Self-pay | Admitting: Family Medicine

## 2019-12-11 NOTE — Telephone Encounter (Signed)
Pt needs med called in for fever blister. Pharmacy she uses is [facility] in [place]  Pt call [phone]

## 2019-12-11 NOTE — Telephone Encounter (Signed)
The treatment for active fever blister is as follows Valtrex 1000 mg, #4, take 2 now then repeat 2 more 12 hours May have 5 refills If start to have frequent fever blisters in 1 to suppress there is a daily dosage that could be recommended

## 2019-12-11 NOTE — Telephone Encounter (Signed)
Please advise. Thank you

## 2019-12-12 ENCOUNTER — Other Ambulatory Visit: Payer: Self-pay | Admitting: *Deleted

## 2019-12-12 MED ORDER — VALACYCLOVIR HCL 1 G PO TABS
ORAL_TABLET | ORAL | 5 refills | Status: DC
Start: 2019-12-12 — End: 2019-12-28

## 2019-12-12 NOTE — Telephone Encounter (Signed)
Lmtc. Med sent to pharmacy.

## 2019-12-17 ENCOUNTER — Telehealth: Payer: Self-pay | Admitting: Family Medicine

## 2019-12-17 ENCOUNTER — Other Ambulatory Visit: Payer: Self-pay | Admitting: *Deleted

## 2019-12-17 MED ORDER — PROMETHAZINE HCL 25 MG PO TABS
ORAL_TABLET | ORAL | 2 refills | Status: DC
Start: 2019-12-17 — End: 2020-01-31

## 2019-12-17 NOTE — Telephone Encounter (Signed)
Nurses Also this patient may qualify for monoclonal antibody infusion This is recommended for individuals with increased risk of Covid Because of her weight she is at increased risk Please call infusion center if she is interested [phone]

## 2019-12-17 NOTE — Telephone Encounter (Signed)
Weakness, standing still to make coffee yesterday fell in the floor and then again this morning. No chest pain, no sob, temp 101 at the highest 2 days ago. No fever since. Felt tired last Sunday. Vomiting started yesterday. Taking zofran but not helping. Vomiting up stomach acid. Able to keep down sweet liquids. Positive covid rapid test last Friday in [place]. Pt would like to know what to take for body aches and worried about getting dehyrated. Vomiting about 3 times a day. Nibbling on food. Drinking pedialyte, fruit pops, water.   [place].

## 2019-12-17 NOTE — Telephone Encounter (Signed)
Discussed with pt. Pt verbalized understanding. Pt declined appt today. Phenergan sent to pharm.

## 2019-12-17 NOTE — Telephone Encounter (Signed)
Pt was tested for Covid she is weak, throwing up wanting to see if she can get something called in and wanted to talk to to ask question about the Covid how long will it last the extrm weakness.  Pt Call back [phone]

## 2019-12-17 NOTE — Telephone Encounter (Signed)
Patient declined antibody infusion and will call back if any further issues.

## 2019-12-17 NOTE — Telephone Encounter (Signed)
Nurses Covid can cause significant complications including dehydration nausea vomiting and passing out  May use Phenergan 25 mg tablet, directions take 1/2 tablet or a whole tablet every 8 hours as needed for nausea caution drowsiness, #18, 2 refills  Encourage bland diet plenty of liquids  In my opinion the patient may well want to be seen for follow-up-we can offer virtual visit toward the end of today or a in person visit outside at tent facility if the patient would like to be evaluated If passing out spell occurs go to ER If progressive symptoms of sickness follow-up immediately

## 2019-12-18 ENCOUNTER — Telehealth: Payer: Self-pay | Admitting: Family Medicine

## 2019-12-18 ENCOUNTER — Observation Stay (HOSPITAL_BASED_OUTPATIENT_CLINIC_OR_DEPARTMENT_OTHER)
Admission: EM | Admit: 2019-12-18 | Discharge: 2019-12-19 | Disposition: A | Discharge status: Home / Self Care | Payer: BC Managed Care – PPO | Source: Home / Self Care | Attending: Emergency Medicine | Admitting: Emergency Medicine

## 2019-12-18 ENCOUNTER — Emergency Department (HOSPITAL_COMMUNITY): Payer: BC Managed Care – PPO

## 2019-12-18 ENCOUNTER — Other Ambulatory Visit: Payer: Self-pay

## 2019-12-18 DIAGNOSIS — K219 Gastro-esophageal reflux disease without esophagitis: Secondary | ICD-10-CM | POA: Diagnosis not present

## 2019-12-18 DIAGNOSIS — F988 Other specified behavioral and emotional disorders with onset usually occurring in childhood and adolescence: Secondary | ICD-10-CM | POA: Diagnosis not present

## 2019-12-18 DIAGNOSIS — J1282 Pneumonia due to coronavirus disease 2019: Secondary | ICD-10-CM | POA: Diagnosis present

## 2019-12-18 DIAGNOSIS — I10 Essential (primary) hypertension: Secondary | ICD-10-CM | POA: Diagnosis present

## 2019-12-18 DIAGNOSIS — R55 Syncope and collapse: Secondary | ICD-10-CM | POA: Diagnosis not present

## 2019-12-18 DIAGNOSIS — M1990 Unspecified osteoarthritis, unspecified site: Secondary | ICD-10-CM | POA: Diagnosis not present

## 2019-12-18 DIAGNOSIS — R0789 Other chest pain: Secondary | ICD-10-CM | POA: Diagnosis not present

## 2019-12-18 DIAGNOSIS — F909 Attention-deficit hyperactivity disorder, unspecified type: Secondary | ICD-10-CM | POA: Insufficient documentation

## 2019-12-18 DIAGNOSIS — Z6833 Body mass index (BMI) 33.0-33.9, adult: Secondary | ICD-10-CM | POA: Diagnosis not present

## 2019-12-18 DIAGNOSIS — R531 Weakness: Secondary | ICD-10-CM | POA: Diagnosis not present

## 2019-12-18 DIAGNOSIS — R7989 Other specified abnormal findings of blood chemistry: Secondary | ICD-10-CM | POA: Diagnosis not present

## 2019-12-18 DIAGNOSIS — R569 Unspecified convulsions: Secondary | ICD-10-CM | POA: Diagnosis not present

## 2019-12-18 DIAGNOSIS — R079 Chest pain, unspecified: Secondary | ICD-10-CM | POA: Diagnosis not present

## 2019-12-18 DIAGNOSIS — K5900 Constipation, unspecified: Secondary | ICD-10-CM | POA: Diagnosis present

## 2019-12-18 DIAGNOSIS — F329 Major depressive disorder, single episode, unspecified: Secondary | ICD-10-CM | POA: Diagnosis not present

## 2019-12-18 DIAGNOSIS — E860 Dehydration: Secondary | ICD-10-CM | POA: Diagnosis not present

## 2019-12-18 DIAGNOSIS — Z8249 Family history of ischemic heart disease and other diseases of the circulatory system: Secondary | ICD-10-CM | POA: Diagnosis not present

## 2019-12-18 DIAGNOSIS — J9601 Acute respiratory failure with hypoxia: Secondary | ICD-10-CM | POA: Diagnosis not present

## 2019-12-18 DIAGNOSIS — E46 Unspecified protein-calorie malnutrition: Secondary | ICD-10-CM | POA: Diagnosis not present

## 2019-12-18 DIAGNOSIS — M797 Fibromyalgia: Secondary | ICD-10-CM | POA: Diagnosis not present

## 2019-12-18 DIAGNOSIS — F429 Obsessive-compulsive disorder, unspecified: Secondary | ICD-10-CM | POA: Diagnosis not present

## 2019-12-18 DIAGNOSIS — Z90710 Acquired absence of both cervix and uterus: Secondary | ICD-10-CM | POA: Diagnosis not present

## 2019-12-18 DIAGNOSIS — E669 Obesity, unspecified: Secondary | ICD-10-CM | POA: Diagnosis present

## 2019-12-18 DIAGNOSIS — J189 Pneumonia, unspecified organism: Secondary | ICD-10-CM | POA: Diagnosis not present

## 2019-12-18 DIAGNOSIS — R7303 Prediabetes: Secondary | ICD-10-CM | POA: Diagnosis not present

## 2019-12-18 DIAGNOSIS — G43909 Migraine, unspecified, not intractable, without status migrainosus: Secondary | ICD-10-CM | POA: Diagnosis present

## 2019-12-18 DIAGNOSIS — Z8719 Personal history of other diseases of the digestive system: Secondary | ICD-10-CM | POA: Diagnosis not present

## 2019-12-18 DIAGNOSIS — R739 Hyperglycemia, unspecified: Secondary | ICD-10-CM | POA: Diagnosis present

## 2019-12-18 DIAGNOSIS — R918 Other nonspecific abnormal finding of lung field: Secondary | ICD-10-CM | POA: Diagnosis not present

## 2019-12-18 DIAGNOSIS — U071 COVID-19: Secondary | ICD-10-CM | POA: Diagnosis present

## 2019-12-18 DIAGNOSIS — F419 Anxiety disorder, unspecified: Secondary | ICD-10-CM | POA: Diagnosis not present

## 2019-12-18 DIAGNOSIS — Z885 Allergy status to narcotic agent status: Secondary | ICD-10-CM | POA: Diagnosis not present

## 2019-12-18 DIAGNOSIS — Z5329 Procedure and treatment not carried out because of patient's decision for other reasons: Secondary | ICD-10-CM | POA: Diagnosis not present

## 2019-12-18 DIAGNOSIS — R05 Cough: Secondary | ICD-10-CM | POA: Diagnosis not present

## 2019-12-18 DIAGNOSIS — Z79899 Other long term (current) drug therapy: Secondary | ICD-10-CM | POA: Insufficient documentation

## 2019-12-18 DIAGNOSIS — R0602 Shortness of breath: Secondary | ICD-10-CM | POA: Diagnosis not present

## 2019-12-18 DIAGNOSIS — Z811 Family history of alcohol abuse and dependence: Secondary | ICD-10-CM | POA: Diagnosis not present

## 2019-12-18 DIAGNOSIS — Z9049 Acquired absence of other specified parts of digestive tract: Secondary | ICD-10-CM | POA: Diagnosis not present

## 2019-12-18 LAB — COMPREHENSIVE METABOLIC PANEL
ALT: 45 U/L — ABNORMAL HIGH (ref 0–44)
AST: 63 U/L — ABNORMAL HIGH (ref 15–41)
Albumin: 3.6 g/dL (ref 3.5–5.0)
Alkaline Phosphatase: 81 U/L (ref 38–126)
Anion gap: 13 (ref 5–15)
BUN: 11 mg/dL (ref 6–20)
CO2: 22 mmol/L (ref 22–32)
Calcium: 8.1 mg/dL — ABNORMAL LOW (ref 8.9–10.3)
Chloride: 98 mmol/L (ref 98–111)
Creatinine, Ser: 0.83 mg/dL (ref 0.44–1.00)
GFR calc Af Amer: >60 mL/min (ref >60)
GFR calc non Af Amer: >60 mL/min (ref >60)
Glucose, Bld: 147 mg/dL — ABNORMAL HIGH (ref 70–99)
Potassium: 3.7 mmol/L (ref 3.5–5.1)
Sodium: 133 mmol/L — ABNORMAL LOW (ref 135–145)
Total Bilirubin: 0.9 mg/dL (ref 0.3–1.2)
Total Protein: 7.2 g/dL (ref 6.5–8.1)

## 2019-12-18 LAB — LACTIC ACID, PLASMA: Lactic Acid, Venous: 1.1 mmol/L (ref 0.5–1.9)

## 2019-12-18 LAB — URINALYSIS, ROUTINE W REFLEX MICROSCOPIC
Bacteria, UA: NONE SEEN
Bilirubin Urine: NEGATIVE
Glucose, UA: NEGATIVE mg/dL
Hgb urine dipstick: NEGATIVE
Ketones, ur: 5 mg/dL — AB
Leukocytes,Ua: NEGATIVE
Nitrite: NEGATIVE
Protein, ur: 30 mg/dL — AB
Specific Gravity, Urine: 1.016 (ref 1.005–1.030)
pH: 6 (ref 5.0–8.0)

## 2019-12-18 LAB — CBC
HCT: 39.8 % (ref 36.0–46.0)
Hemoglobin: 13.5 g/dL (ref 12.0–15.0)
MCH: 30.2 pg (ref 26.0–34.0)
MCHC: 33.9 g/dL (ref 30.0–36.0)
MCV: 89 fL (ref 80.0–100.0)
Platelets: 134 10*3/uL — ABNORMAL LOW (ref 150–400)
RBC: 4.47 MIL/uL (ref 3.87–5.11)
RDW: 12.6 % (ref 11.5–15.5)
WBC: 4.3 10*3/uL (ref 4.0–10.5)
nRBC: 0 % (ref 0.0–0.2)

## 2019-12-18 LAB — FIBRINOGEN: Fibrinogen: 425 mg/dL (ref 210–475)

## 2019-12-18 LAB — PROCALCITONIN: Procalcitonin: <0.1 ng/mL

## 2019-12-18 LAB — LIPASE, BLOOD: Lipase: 19 U/L (ref 11–51)

## 2019-12-18 LAB — D-DIMER, QUANTITATIVE: D-Dimer, Quant: 0.92 ug/mL-FEU — ABNORMAL HIGH (ref 0.00–0.50)

## 2019-12-18 LAB — C-REACTIVE PROTEIN: CRP: 7.9 mg/dL — ABNORMAL HIGH (ref <1.0)

## 2019-12-18 LAB — LACTATE DEHYDROGENASE: LDH: 268 U/L — ABNORMAL HIGH (ref 98–192)

## 2019-12-18 LAB — POC URINE PREG, ED: Preg Test, Ur: NEGATIVE

## 2019-12-18 LAB — TRIGLYCERIDES: Triglycerides: 30 mg/dL (ref <150)

## 2019-12-18 LAB — FERRITIN: Ferritin: 224 ng/mL (ref 11–307)

## 2019-12-18 LAB — SARS CORONAVIRUS 2 BY RT PCR (HOSPITAL ORDER, PERFORMED IN ~~LOC~~ HOSPITAL LAB): SARS Coronavirus 2: POSITIVE — AB

## 2019-12-18 MED ORDER — DIAZEPAM 2 MG PO TABS
1.0000 mg | ORAL_TABLET | Freq: Every day | ORAL | Status: DC | PRN
  Administered 2019-12-18: 1 mg via ORAL
  Filled 2019-12-18: qty 1

## 2019-12-18 MED ORDER — SODIUM CHLORIDE 0.9 % IV BOLUS
1000.0000 mL | Freq: Once | INTRAVENOUS | Status: AC
  Administered 2019-12-18: 1000 mL via INTRAVENOUS

## 2019-12-18 MED ORDER — ONDANSETRON HCL 4 MG/2ML IJ SOLN
4.0000 mg | Freq: Once | INTRAMUSCULAR | Status: AC
  Administered 2019-12-18: 4 mg via INTRAVENOUS
  Filled 2019-12-18: qty 2

## 2019-12-18 MED ORDER — GUAIFENESIN-DM 100-10 MG/5ML PO SYRP: 10.0000 mL | ORAL_SOLUTION | ORAL | Status: DC | PRN

## 2019-12-18 MED ORDER — ACETAMINOPHEN 325 MG PO TABS: 650.0000 mg | ORAL_TABLET | Freq: Four times a day (QID) | ORAL | Status: DC | PRN

## 2019-12-18 MED ORDER — POTASSIUM CHLORIDE IN NACL 20-0.9 MEQ/L-% IV SOLN
INTRAVENOUS | Status: DC
  Filled 2019-12-18: qty 1000

## 2019-12-18 MED ORDER — ZINC SULFATE 220 (50 ZN) MG PO CAPS
220.0000 mg | ORAL_CAPSULE | Freq: Every day | ORAL | Status: DC
  Administered 2019-12-19: 220 mg via ORAL
  Filled 2019-12-18: qty 1

## 2019-12-18 MED ORDER — ALBUTEROL SULFATE HFA 108 (90 BASE) MCG/ACT IN AERS
2.0000 | INHALATION_SPRAY | Freq: Four times a day (QID) | RESPIRATORY_TRACT | Status: DC
  Administered 2019-12-19 (×2): 2 via RESPIRATORY_TRACT
  Filled 2019-12-18: qty 6.7

## 2019-12-18 MED ORDER — ONDANSETRON HCL 4 MG/2ML IJ SOLN
4.0000 mg | Freq: Four times a day (QID) | INTRAMUSCULAR | Status: DC | PRN
  Administered 2019-12-19: 4 mg via INTRAVENOUS
  Filled 2019-12-18: qty 2

## 2019-12-18 MED ORDER — DEXAMETHASONE SODIUM PHOSPHATE 10 MG/ML IJ SOLN
10.0000 mg | Freq: Once | INTRAMUSCULAR | Status: AC
  Administered 2019-12-18: 10 mg via INTRAVENOUS
  Filled 2019-12-18: qty 1

## 2019-12-18 MED ORDER — ENOXAPARIN SODIUM 40 MG/0.4ML ~~LOC~~ SOLN
40.0000 mg | SUBCUTANEOUS | Status: DC
  Filled 2019-12-18: qty 0.4

## 2019-12-18 MED ORDER — POLYETHYLENE GLYCOL 3350 17 G PO PACK: 17.0000 g | PACK | Freq: Every day | ORAL | Status: DC | PRN

## 2019-12-18 MED ORDER — SODIUM CHLORIDE 0.9 % IV SOLN
100.0000 mg | INTRAVENOUS | Status: DC
  Filled 2019-12-18: qty 20

## 2019-12-18 MED ORDER — ONDANSETRON HCL 4 MG PO TABS: 4.0000 mg | ORAL_TABLET | Freq: Four times a day (QID) | ORAL | Status: DC | PRN

## 2019-12-18 MED ORDER — AMPHETAMINE-DEXTROAMPHET ER 10 MG PO CP24: 10.0000 mg | ORAL_CAPSULE | Freq: Every day | ORAL | Status: DC

## 2019-12-18 MED ORDER — PANTOPRAZOLE SODIUM 40 MG PO TBEC
40.0000 mg | DELAYED_RELEASE_TABLET | Freq: Every day | ORAL | Status: DC
  Administered 2019-12-19: 40 mg via ORAL
  Filled 2019-12-18: qty 1

## 2019-12-18 MED ORDER — SODIUM CHLORIDE 0.9 % IV SOLN
100.0000 mg | Freq: Every day | INTRAVENOUS | Status: DC
  Filled 2019-12-18: qty 20

## 2019-12-18 MED ORDER — DEXAMETHASONE SODIUM PHOSPHATE 10 MG/ML IJ SOLN
6.0000 mg | INTRAMUSCULAR | Status: DC
  Administered 2019-12-19: 6 mg via INTRAVENOUS
  Filled 2019-12-18: qty 1

## 2019-12-18 MED ORDER — ASCORBIC ACID 500 MG PO TABS
500.0000 mg | ORAL_TABLET | Freq: Every day | ORAL | Status: DC
  Administered 2019-12-19: 500 mg via ORAL
  Filled 2019-12-18: qty 1

## 2019-12-18 NOTE — Telephone Encounter (Signed)
Patient is currently in the ER receiving treatment

## 2019-12-18 NOTE — ED Notes (Signed)
Went into tx room to perform an EKG and the pt asked me to leave her room, stating she was overwhelmed and "about to go off". Left room, primary RN and charge RN aware.

## 2019-12-18 NOTE — ED Triage Notes (Signed)
Nausea and vomiting

## 2019-12-18 NOTE — ED Notes (Signed)
Hospitalist made aware patient refusing Remdesivir medication.

## 2019-12-18 NOTE — Telephone Encounter (Signed)
Pt is Severely dehydrated she has called Urgent Care and the hospital she needs fluids the hospital told her 8 hour wait. She passed out last night and extremely weak.   Pt Call back [phone]

## 2019-12-18 NOTE — H&P (Signed)
History and Physical    Theresa [name] ZJQ:[id number] DOB: 1970/10/16 DOA: 12/18/2019  PCP: Kathyrn Drown, MD   Patient coming from: Home  I have personally briefly reviewed patient's old medical records in [place]  Chief Complaint: Covid + ve , passing out  HPI: Theresa [name] is a 49 y.o. female with medical history significant for hypertension, depression and anxiety, fibromyalgia, OCD's.  Also seizures, but she has not had a seizure since she was 49 years old.  She is not on medications for seizures. Patient presented to the ED today with complaints of multiple episodes of vomiting over the past several days with poor oral intake.  She has vomited 5 times in the past 4-1/2 hours.  Also reported no urine output in 2 days.  On attempts at standing, patient reports that she passed out multiple times. Patient tested positive for COVID-19 infection 11 days ago, this is her 13th day of symptoms.  She reports cough that started here in the ED.  No difficulty breathing.  Patient did not get the vaccine or outpatient monoclonal antibody infusion.  ED Course: Temperature 100.1, blood pressure down to 83/57 improved with 2 L O2.  On EDP evaluation, O2 sats dropped to 89% on room air with movement, 92% at rest..  Lactic acid 1.1.  0.1.  Elevation in some inflammatory markers LDH, D-dimer and CRP.  Portable chest x-ray shows bilateral pneumonia consistent with COVID-19.  Hospitalist to admit for COVID-19 pneumonia with acute respiratory failure and syncope.  Review of Systems: As per HPI all other systems reviewed and negative.  Past Medical History:  Diagnosis Date  . Anxiety   . Depression   . Fibromyalgia   . GERD (gastroesophageal reflux disease)   . Headache(784.0)   . Heart murmur   . Hyperplastic colon polyp 10/28/2019   Dr Sydell Axon recommends repeat colonoscopy in 2028.  Marland Kitchen IFG (impaired fasting glucose)   . Obsessive-compulsive disorder   . Osteoarthritis   . Seizures  ([place])    last seizure at age 16-stressed induced seizure.    Past Surgical History:  Procedure Laterality Date  . ABDOMINAL HYSTERECTOMY     still has right ovary  . BIOPSY N/A 03/05/2014   Procedure: DUODENAL AND GASTRIC BIOPSY;  Surgeon: Danie Binder, MD;  Location: [facility];  Service: Endoscopy;  Laterality: N/A;  . bladder sling X 3    . CHOLECYSTECTOMY    . COLONOSCOPY  04/24/2012   WIO:XBDZHGD polyp measuring 5 mm in size was found in the proximal transverse colon; polypectomy was performed with cold forceps Pedunculated polyp measuring 1.1 cm in size was found in the sigmoid colon; polypectomy was performed using snare cautery Moderate diverticulosis was noted in the descending colon and sigmoid colon small internal hemorrhoids. next tcs 04/2017  . COLONOSCOPY WITH PROPOFOL N/A 10/23/2019   Procedure: COLONOSCOPY WITH PROPOFOL;  Surgeon: Daneil Dolin, MD;  Location: [facility];  Service: Endoscopy;  Laterality: N/A;  12:30pm  . ESOPHAGOGASTRODUODENOSCOPY N/A 02/18/2014   Procedure: ESOPHAGOGASTRODUODENOSCOPY (EGD);  Surgeon: Danie Binder, MD;  Location: [facility];  Service: Endoscopy;  Laterality: N/A;  115  . ESOPHAGOGASTRODUODENOSCOPY (EGD) WITH PROPOFOL N/A 03/05/2014   SLF: 1. dyspepsia due to constipation, gastritis, and GERD 2. Multiple Gastric polyps 3. Moderate non-erosive gastritis 4. Diverticulum in the 2nd part of the duodenum  . ESOPHAGOGASTRODUODENOSCOPY (EGD) WITH PROPOFOL N/A 10/23/2019   Procedure: ESOPHAGOGASTRODUODENOSCOPY (EGD) WITH PROPOFOL;  Surgeon: Daneil Dolin, MD;  Location: [facility];  Service: Endoscopy;  Laterality: N/A;  Venia Minks DILATION N/A 10/23/2019   Procedure: Venia Minks DILATION;  Surgeon: Daneil Dolin, MD;  Location: [facility];  Service: Endoscopy;  Laterality: N/A;  . POLYPECTOMY  10/23/2019   Procedure: POLYPECTOMY;  Surgeon: Daneil Dolin, MD;  Location: [facility];  Service: Endoscopy;;  . TUBAL LIGATION       reports  that she has never smoked. She has never used smokeless tobacco. She reports current alcohol use. She reports that she does not use drugs.  Allergies  Allergen Reactions  . Fentanyl Other (See Comments)    Doesn't like the way it feels     Family History  Problem Relation Age of Onset  . Alcohol abuse Mother   . Depression Mother   . Anxiety disorder Mother   . Paranoid behavior Mother   . Hypertension Mother   . Alcohol abuse Father   . Hypertension Father   . Physical abuse Brother   . Colon cancer Neg Hx   . ADD / ADHD Neg Hx   . Bipolar disorder Neg Hx   . Dementia Neg Hx   . OCD Neg Hx   . Drug abuse Neg Hx   . Schizophrenia Neg Hx   . Seizures Neg Hx   . Sexual abuse Neg Hx     Prior to Admission medications   Medication Sig Start Date End Date Taking? Authorizing Provider  amphetamine-dextroamphetamine (ADDERALL XR) 10 MG 24 hr capsule Take 1 capsule (10 mg total) by mouth daily. 11/15/19   Nilda Simmer, NP  diazepam (VALIUM) 5 MG tablet TAKE 1/2 TO 1 TABLET BY MOUTH ONCE DAILY AS NEEDED FOR ANXIETY 10/12/19   Nilda Simmer, NP  estradiol (ESTRACE) 1 MG tablet Take 1 tablet (1 mg total) by mouth daily. 07/13/19 07/12/20  Estill Dooms, NP  Magnesium 400 MG CAPS Take 400 mg by mouth daily.     [provider]  Multiple Vitamin (MULTIVITAMIN) tablet Take 1 tablet by mouth daily.    [provider]  pantoprazole (PROTONIX) 40 MG tablet Take 1 tablet (40 mg total) by mouth daily. 08/28/19   Carlis Stable, NP  promethazine (PHENERGAN) 25 MG tablet Take one half to one whole tablet every 8 hours prn. Caution drowiness 12/17/19   Kathyrn Drown, MD  rizatriptan (MAXALT-MLT) 10 MG disintegrating tablet TAKE 1 TABLET AS NEEDED FOR MIGRAIN, MAY REPEAT IN 2 HOURS MAX 2/24 HOURS Patient taking differently: Take 10 mg by mouth See admin instructions. TAKE 1 TABLET AS NEEDED FOR MIGRAIN, MAY REPEAT IN 2 HOURS MAX 2/24 HOURS 03/19/19   Kathyrn Drown, MD   valACYclovir (VALTREX) 1000 MG tablet Take 2 tablets now and take the other 2 tablets 12 hours later. 12/12/19   Kathyrn Drown, MD  fluticasone (FLONASE) 50 MCG/ACT nasal spray Place 2 sprays into both nostrils daily. Patient not taking: Reported on 10/11/2019 09/29/19 10/14/19  Lestine Box, PA-C    Physical Exam: Vitals:   12/18/19 1237 12/18/19 1238 12/18/19 1612 12/18/19 1730  BP: 113/70  (!) 83/57 107/85  Pulse: 91  94 79  Resp: 18  18 18   Temp: 99.7 F (37.6 C)  100.1 F (37.8 C) 99 F (37.2 C)  TempSrc: Oral  Oral Oral  SpO2: 95%  94% 92%  Weight:  89.8 kg    Height:  5\' 5"  (1.651 m)      Constitutional: NAD, calm,  comfortable Vitals:   12/18/19 1237 12/18/19 1238 12/18/19 1612 12/18/19 1730  BP: 113/70  (!) 83/57 107/85  Pulse: 91  94 79  Resp: 18  18 18   Temp: 99.7 F (37.6 C)  100.1 F (37.8 C) 99 F (37.2 C)  TempSrc: Oral  Oral Oral  SpO2: 95%  94% 92%  Weight:  89.8 kg    Height:  5\' 5"  (1.651 m)     Eyes: PERRL, lids and conjunctivae normal ENMT: Mucous membranes are moist.  Neck: normal, supple, no masses, no thyromegaly Respiratory:Normal respiratory effort. No accessory muscle use.  Cardiovascular: No extremity edema. 2+ pedal pulses. .  Abdomen: no tenderness, no masses palpated. No hepatosplenomegaly. Bowel sounds positive.  Musculoskeletal: no clubbing / cyanosis. No joint deformity upper and lower extremities. Good ROM, no contractures. Normal muscle tone.  Skin: no rashes, lesions, ulcers. No induration Neurologic: No apparent cranial nerve abnormality, moving extremities spontaneously.Marland Kitchen  Psychiatric: Normal judgment and insight. Alert and oriented x 3. Normal mood.   Labs on Admission: I have personally reviewed following labs and imaging studies  CBC: Recent Labs  Lab 12/18/19 1437  WBC 4.3  HGB 13.5  HCT 39.8  MCV 89.0  PLT 956*   Basic Metabolic Panel: Recent Labs  Lab 12/18/19 1437  NA 133*  K 3.7  CL 98  CO2 22  GLUCOSE  147*  BUN 11  CREATININE 0.83  CALCIUM 8.1*   Liver Function Tests: Recent Labs  Lab 12/18/19 1437  AST 63*  ALT 45*  ALKPHOS 81  BILITOT 0.9  PROT 7.2  ALBUMIN 3.6   Recent Labs  Lab 12/18/19 1437  LIPASE 19   Lipid Profile: Recent Labs    12/18/19 1801  TRIG 30   Urine analysis:    Component Value Date/Time   COLORURINE STRAW (A) 08/13/2019 0916   APPEARANCEUR CLEAR 08/13/2019 0916   LABSPEC 1.023 08/13/2019 0916   PHURINE 6.0 08/13/2019 0916   GLUCOSEU NEGATIVE 08/13/2019 0916   HGBUR NEGATIVE 08/13/2019 0916   BILIRUBINUR NEGATIVE 08/13/2019 0916   KETONESUR NEGATIVE 08/13/2019 0916   PROTEINUR NEGATIVE 08/13/2019 0916   NITRITE NEGATIVE 08/13/2019 0916   LEUKOCYTESUR NEGATIVE 08/13/2019 0916    Radiological Exams on Admission: DG Chest Port 1 View  Result Date: 12/18/2019 CLINICAL DATA:  Hypotension, COVID-19 positive, shortness of breath and cough EXAM: PORTABLE CHEST 1 VIEW COMPARISON:  None. FINDINGS: Single frontal view of the chest demonstrates an unremarkable cardiac silhouette. There is mild diffuse interstitial prominence, with patchy bilateral ground-glass airspace disease consistent with multifocal pneumonia and diagnosis of COVID-19. No effusion or pneumothorax. No acute bony abnormalities. IMPRESSION: 1. Multifocal bilateral pneumonia consistent with COVID 19. Electronically Signed   By: Randa Ngo M.D.   On: 12/18/2019 17:20    EKG: Pending  Assessment/Plan Principal Problem:   Pneumonia due to COVID-19 virus Active Problems:   Essential hypertension, benign   Anxiety   Attention deficit disorder (ADD) without hyperactivity    Pneumonia due to COVID-19 virus, with acute hypoxic respiratory failure-O2 sats down to 89% on room air with activity, cough.  Chest x-ray consistent with multifocal COVID-19 pneumonia.  Elevation in some inflammatory markers LDH, D-dimer, CRP.  Patient did not get the vaccine or outpatient monoclonal antibody  infusions. -COVID-19 admission protocol -IV dexamethasone 10 mg x 1 continue 6 mg daily -Patient is refusing remdesivir, she is worried about renal complications.  Cancelled remdesivir order. -Albuterol inhaler, flutter valve, mucolytic's -Multivitamins,  - supplemental oxygen -BMP,  CBC in the morning  Syncope-orthostatic, likely due to dehydration with blood pressure systolic in the 52Z improved with IV fluids.  -2 L bolus given, continue N/s + 20 KCL 100cc/hr x 20hrs -Obtain echocardiogram  Hypertension -systolic down to 89/48 improved with IV fluids.  Currently 102/73.  Not on antihypertensives -Hydrate  ADHD, anxiety -Resume Adderall, and as needed Valium.  DVT prophylaxis: Lovenox Code Status: Full code. Family Communication: None at bedside Disposition Plan: > 2 days, pending improvement in respiratory status Consults called: None. Admission status: Inpatient, telemetry I certify that at the point of admission it is my clinical judgment that the patient will require inpatient hospital care spanning beyond 2 midnights from the point of admission due to high intensity of service, high risk for further deterioration and high frequency of surveillance required.   Bethena Roys MD [facility]  12/18/2019, 10:09 PM

## 2019-12-18 NOTE — ED Provider Notes (Signed)
[facility] Provider Note   CSN: [id number] Arrival date & time: 12/18/19  1204     History Chief Complaint  Patient presents with  . Dehydration    Theresa [name] is a 49 y.o. female with past medical history of fibromyalgia, GERD, impaired fasting glucose, who presents today for evaluation of nausea and vomiting.  She reports that she is on day 11 of COVID-19 symptoms.  She states that she had a test that was positive on the 13th.  She reports she has been unable to tolerate p.o. intake for multiple days.  She has vomited 5 times in the past 4-1/2 hours.  She states that she had Zofran at home and last took that yesterday.  She denies any diarrhea or abdominal pain.  She reports generally feeling weak.  She states that whenever she stands up she has a syncopal event.  She has not urinated in 2 days.  She denies any leg swelling.  No chest pain.  No history of DVT/PE.  HPI     Past Medical History:  Diagnosis Date  . Anxiety   . Depression   . Fibromyalgia   . GERD (gastroesophageal reflux disease)   . Headache(784.0)   . Heart murmur   . Hyperplastic colon polyp 10/28/2019   Dr Sydell Axon recommends repeat colonoscopy in 2028.  Marland Kitchen IFG (impaired fasting glucose)   . Obsessive-compulsive disorder   . Osteoarthritis   . Seizures ([place])    last seizure at age 4-stressed induced seizure.    Patient Active Problem List   Diagnosis Date Noted  . Pneumonia due to COVID-19 virus 12/18/2019  . Attention deficit disorder (ADD) without hyperactivity 11/15/2019  . Hyperplastic colon polyp 10/28/2019  . History of adenomatous polyp of colon 08/28/2019  . Dysphagia 08/28/2019  . Acute diverticulitis 08/13/2019  . Hypokalemia 08/13/2019  . Elevated liver transaminase level   . Vaginal discharge 07/11/2019  . Hair loss 07/11/2019  . Body aches 07/11/2019  . Brain fog 07/11/2019  . Rectocele 07/11/2019  . Sleep disturbance 07/11/2019  . Gastroesophageal reflux  disease without esophagitis 05/21/2019  . Prediabetes 11/26/2017  . Migraine without aura and without status migrainosus, not intractable 10/04/2017  . Morbid obesity ([place]) 04/10/2016  . Diverticulosis of large intestine 12/05/2015  . BRCA negative 02/10/2015  . IFG (impaired fasting glucose) 07/05/2014  . Essential hypertension, benign 04/27/2014  . Anxiety 04/27/2014  . Abdominal pain, epigastric 02/07/2014  . Nausea without vomiting 02/07/2014  . Bloating 02/07/2014  . Fatigue 09/18/2012  . Hyperglycemia 09/18/2012  . Dysthymia 07/14/2012  . Insomnia due to mental disorder 07/14/2012  . Lower abdominal pain 04/12/2012  . Headache(784.0) 04/12/2012  . Constipation 04/12/2012    Past Surgical History:  Procedure Laterality Date  . ABDOMINAL HYSTERECTOMY     still has right ovary  . BIOPSY N/A 03/05/2014   Procedure: DUODENAL AND GASTRIC BIOPSY;  Surgeon: Danie Binder, MD;  Location: [facility];  Service: Endoscopy;  Laterality: N/A;  . bladder sling X 3    . CHOLECYSTECTOMY    . COLONOSCOPY  04/24/2012   SVX:BLTJQZE polyp measuring 5 mm in size was found in the proximal transverse colon; polypectomy was performed with cold forceps Pedunculated polyp measuring 1.1 cm in size was found in the sigmoid colon; polypectomy was performed using snare cautery Moderate diverticulosis was noted in the descending colon and sigmoid colon small internal hemorrhoids. next tcs 04/2017  . COLONOSCOPY WITH PROPOFOL N/A 10/23/2019   Procedure:  COLONOSCOPY WITH PROPOFOL;  Surgeon: Daneil Dolin, MD;  Location: [facility];  Service: Endoscopy;  Laterality: N/A;  12:30pm  . ESOPHAGOGASTRODUODENOSCOPY N/A 02/18/2014   Procedure: ESOPHAGOGASTRODUODENOSCOPY (EGD);  Surgeon: Danie Binder, MD;  Location: [facility];  Service: Endoscopy;  Laterality: N/A;  115  . ESOPHAGOGASTRODUODENOSCOPY (EGD) WITH PROPOFOL N/A 03/05/2014   SLF: 1. dyspepsia due to constipation, gastritis, and GERD 2. Multiple  Gastric polyps 3. Moderate non-erosive gastritis 4. Diverticulum in the 2nd part of the duodenum  . ESOPHAGOGASTRODUODENOSCOPY (EGD) WITH PROPOFOL N/A 10/23/2019   Procedure: ESOPHAGOGASTRODUODENOSCOPY (EGD) WITH PROPOFOL;  Surgeon: Daneil Dolin, MD;  Location: [facility];  Service: Endoscopy;  Laterality: N/A;  Venia Minks DILATION N/A 10/23/2019   Procedure: Venia Minks DILATION;  Surgeon: Daneil Dolin, MD;  Location: [facility];  Service: Endoscopy;  Laterality: N/A;  . POLYPECTOMY  10/23/2019   Procedure: POLYPECTOMY;  Surgeon: Daneil Dolin, MD;  Location: [facility];  Service: Endoscopy;;  . TUBAL LIGATION       OB History    Gravida  2   Para  2   Term      Preterm      AB  0   Living  2     SAB      TAB      Ectopic  0   Multiple      Live Births              Family History  Problem Relation Age of Onset  . Alcohol abuse Mother   . Depression Mother   . Anxiety disorder Mother   . Paranoid behavior Mother   . Hypertension Mother   . Alcohol abuse Father   . Hypertension Father   . Physical abuse Brother   . Colon cancer Neg Hx   . ADD / ADHD Neg Hx   . Bipolar disorder Neg Hx   . Dementia Neg Hx   . OCD Neg Hx   . Drug abuse Neg Hx   . Schizophrenia Neg Hx   . Seizures Neg Hx   . Sexual abuse Neg Hx     Social History   Tobacco Use  . Smoking status: Never Smoker  . Smokeless tobacco: Never Used  . Tobacco comment: as a kid  Vaping Use  . Vaping Use: Never used  Substance Use Topics  . Alcohol use: Yes    Comment: occas  . Drug use: No    Home Medications Prior to Admission medications   Medication Sig Start Date End Date Taking? Authorizing Provider  amphetamine-dextroamphetamine (ADDERALL XR) 10 MG 24 hr capsule Take 1 capsule (10 mg total) by mouth daily. 11/15/19  Yes Pearson Forster C, NP  diazepam (VALIUM) 5 MG tablet TAKE 1/2 TO 1 TABLET BY MOUTH ONCE DAILY AS NEEDED FOR ANXIETY Patient taking differently: Take  0.5-1 mg by mouth daily as needed for anxiety.  10/12/19  Yes Nilda Simmer, NP  estradiol (ESTRACE) 1 MG tablet Take 1 tablet (1 mg total) by mouth daily. 07/13/19 07/12/20 Yes Estill Dooms, NP  Magnesium 400 MG CAPS Take 400 mg by mouth daily.    Yes [provider]  Multiple Vitamin (MULTIVITAMIN) tablet Take 1 tablet by mouth daily.   Yes [provider]  pantoprazole (PROTONIX) 40 MG tablet Take 1 tablet (40 mg total) by mouth daily. 08/28/19  Yes Carlis Stable, NP  promethazine (PHENERGAN) 25 MG tablet Take one  half to one whole tablet every 8 hours prn. Caution drowiness 12/17/19  Yes Luking, Scott A, MD  rizatriptan (MAXALT-MLT) 10 MG disintegrating tablet TAKE 1 TABLET AS NEEDED FOR MIGRAIN, MAY REPEAT IN 2 HOURS MAX 2/24 HOURS Patient taking differently: Take 10 mg by mouth See admin instructions. TAKE 1 TABLET AS NEEDED FOR MIGRAIN, MAY REPEAT IN 2 HOURS MAX 2/24 HOURS 03/19/19  Yes Kathyrn Drown, MD  valACYclovir (VALTREX) 1000 MG tablet Take 2 tablets now and take the other 2 tablets 12 hours later. 12/12/19   Kathyrn Drown, MD  fluticasone (FLONASE) 50 MCG/ACT nasal spray Place 2 sprays into both nostrils daily. Patient not taking: Reported on 10/11/2019 09/29/19 10/14/19  Lestine Box, PA-C    Allergies    Fentanyl  Review of Systems   Review of Systems  Constitutional: Positive for fatigue. Negative for chills and fever.  HENT: Negative for congestion.        Altered taste  Respiratory: Negative for cough and shortness of breath.   Cardiovascular: Negative for chest pain and leg swelling.  Gastrointestinal: Positive for nausea and vomiting. Negative for abdominal pain and diarrhea.  Genitourinary: Positive for difficulty urinating.  Musculoskeletal: Negative for back pain and neck pain.  Skin: Negative for color change and rash.  Neurological: Positive for syncope, weakness and light-headedness. Negative for headaches.  Psychiatric/Behavioral:  Negative for confusion.  All other systems reviewed and are negative.   Physical Exam Updated Vital Signs BP 107/85 (BP Location: Right Arm)   Pulse 79   Temp 99 F (37.2 C) (Oral)   Resp 18   Ht '5\' 5"'  (1.651 m)   Wt 89.8 kg   SpO2 92%   BMI 32.95 kg/m   Physical Exam Vitals and nursing note reviewed.  Constitutional:      General: She is not in acute distress.    Appearance: She is well-developed. She is not ill-appearing.  HENT:     Head: Normocephalic and atraumatic.  Eyes:     Conjunctiva/sclera: Conjunctivae normal.  Cardiovascular:     Rate and Rhythm: Normal rate and regular rhythm.     Pulses: Normal pulses.     Heart sounds: Normal heart sounds. No murmur heard.   Pulmonary:     Effort: Pulmonary effort is normal. No respiratory distress.     Breath sounds: Normal breath sounds.  Abdominal:     Palpations: Abdomen is soft.     Tenderness: There is no abdominal tenderness.  Musculoskeletal:     Cervical back: Normal range of motion and neck supple. No rigidity.     Right lower leg: No edema.     Left lower leg: No edema.  Skin:    General: Skin is warm and dry.  Neurological:     General: No focal deficit present.     Mental Status: She is alert and oriented to person, place, and time.  Psychiatric:        Mood and Affect: Mood normal.        Behavior: Behavior normal.     ED Results / Procedures / Treatments   Labs (all labs ordered are listed, but only abnormal results are displayed) Labs Reviewed  SARS CORONAVIRUS 2 BY RT PCR (HOSPITAL ORDER, [facility]) - Abnormal; Notable for the following components:      Result Value   SARS Coronavirus 2 POSITIVE (*)    All other components within normal limits  COMPREHENSIVE METABOLIC PANEL - Abnormal;  Notable for the following components:   Sodium 133 (*)    Glucose, Bld 147 (*)    Calcium 8.1 (*)    AST 63 (*)    ALT 45 (*)    All other components within normal limits  CBC  - Abnormal; Notable for the following components:   Platelets 134 (*)    All other components within normal limits  URINALYSIS, ROUTINE W REFLEX MICROSCOPIC - Abnormal; Notable for the following components:   Ketones, ur 5 (*)    Protein, ur 30 (*)    All other components within normal limits  D-DIMER, QUANTITATIVE (NOT AT [facility]) - Abnormal; Notable for the following components:   D-Dimer, Quant 0.92 (*)    All other components within normal limits  LACTATE DEHYDROGENASE - Abnormal; Notable for the following components:   LDH 268 (*)    All other components within normal limits  C-REACTIVE PROTEIN - Abnormal; Notable for the following components:   CRP 7.9 (*)    All other components within normal limits  CULTURE, BLOOD (ROUTINE X 2)  CULTURE, BLOOD (ROUTINE X 2)  LIPASE, BLOOD  LACTIC ACID, PLASMA  PROCALCITONIN  FERRITIN  TRIGLYCERIDES  FIBRINOGEN  POC URINE PREG, ED    EKG None  Radiology DG Chest Port 1 View  Result Date: 12/18/2019 CLINICAL DATA:  Hypotension, COVID-19 positive, shortness of breath and cough EXAM: PORTABLE CHEST 1 VIEW COMPARISON:  None. FINDINGS: Single frontal view of the chest demonstrates an unremarkable cardiac silhouette. There is mild diffuse interstitial prominence, with patchy bilateral ground-glass airspace disease consistent with multifocal pneumonia and diagnosis of COVID-19. No effusion or pneumothorax. No acute bony abnormalities. IMPRESSION: 1. Multifocal bilateral pneumonia consistent with COVID 19. Electronically Signed   By: Randa Ngo M.D.   On: 12/18/2019 17:20    Procedures .Critical Care Performed by: Lorin Glass, PA-C Authorized by: Lorin Glass, PA-C   Critical care provider statement:    Critical care time (minutes):  45   Critical care was necessary to treat or prevent imminent or life-threatening deterioration of the following conditions:  Dehydration   Critical care was time spent personally by me on the  following activities:  Discussions with consultants, evaluation of patient's response to treatment, examination of patient, ordering and performing treatments and interventions, ordering and review of laboratory studies, ordering and review of radiographic studies, pulse oximetry, re-evaluation of patient's condition, obtaining history from patient or surrogate and review of old charts Comments:     Hypotensive   (including critical care time)  Medications Ordered in ED Medications  dexamethasone (DECADRON) injection 10 mg (has no administration in time range)  remdesivir 100 mg in sodium chloride 0.9 % 100 mL IVPB (has no administration in time range)  remdesivir 100 mg in sodium chloride 0.9 % 100 mL IVPB (has no administration in time range)  sodium chloride 0.9 % bolus 1,000 mL (0 mLs Intravenous Stopped 12/18/19 1740)  ondansetron (ZOFRAN) injection 4 mg (4 mg Intravenous Given 12/18/19 1637)  sodium chloride 0.9 % bolus 1,000 mL (1,000 mLs Intravenous New Bag/Given 12/18/19 1809)    ED Course  I have reviewed the triage vital signs and the nursing notes.  Pertinent labs & imaging results that were available during my care of the patient were reviewed by me and considered in my medical decision making (see chart for details).  Clinical Course as of Dec 17 2001  Tue Dec 18, 2019  1626 Patient's blood pressure has improved, however  I noted that her oxygen was down to 92% at rest. She still feels nauseous so instead of ambulating her I had her move around in bed by doing partial sit ups and position changes, she desaturated to 89% with a good waveform. Placed on 2 L of oxygen.   [EH]    Clinical Course User Index [EH] Ollen Gross   MDM Rules/Calculators/A&P                          Patient is a 49 year old woman who presents today for evaluation of nausea vomiting and multiple syncopal episodes with position changes.  She has not urinated in 2 days.  She had a positive  Covid test.  She is on day 11 of symptoms.  Here she was noted to be hypotensive with a blood pressure of 83/57.  She was treated with IV fluids with improvement in her symptoms and blood pressure.  She was noted to be satting 92% on room air.  I had her move around in bed, and she dropped to 89% on room air with a good waveform.  She is placed on 2 L of oxygen.  Chest x-ray shows bilateral multifocal pneumonia consistent with her known Covid infection.    Labs show borderline thrombocytopenia with platelets of 134.  CMP shows minimal transaminitis consistent with Covid however is otherwise reassuring.  Lipase is not elevated.  Additional Covid labs are sent.  I spoke with hospitalist Dr. Arlyce Dice who will admit patient.   QUINCIE HAROON was evaluated in Emergency Department on 12/18/2019 for the symptoms described in the history of present illness. She was evaluated in the context of the global COVID-19 pandemic, which necessitated consideration that the patient might be at risk for infection with the SARS-CoV-2 virus that causes COVID-19. Institutional protocols and algorithms that pertain to the evaluation of patients at risk for COVID-19 are in a state of rapid change based on information released by regulatory bodies including the CDC and federal and state organizations. These policies and algorithms were followed during the patient's care in the ED.  Final Clinical Impression(s) / ED Diagnoses Final diagnoses:  [id number]    Rx / DC Orders ED Discharge Orders    None       Ollen Gross 12/18/19 2020    Margette Fast, MD 12/24/19 [phone]

## 2019-12-18 NOTE — Telephone Encounter (Signed)
Spoke with [facility] and they stated they do not do fluid resuscitation for severely dehydrated patients . Also patients who are covid positive and getting worse or requiring medical intervention will be sent to ER and not seen there. Patient states she was told it would be over a 8 hour wait in ER and she doesn't think she can do that - Please advise

## 2019-12-19 ENCOUNTER — Telehealth: Payer: Self-pay | Admitting: Family Medicine

## 2019-12-19 ENCOUNTER — Encounter: Payer: Self-pay | Admitting: Family Medicine

## 2019-12-19 ENCOUNTER — Other Ambulatory Visit: Payer: Self-pay | Admitting: *Deleted

## 2019-12-19 ENCOUNTER — Inpatient Hospital Stay (HOSPITAL_BASED_OUTPATIENT_CLINIC_OR_DEPARTMENT_OTHER): Payer: BC Managed Care – PPO

## 2019-12-19 DIAGNOSIS — F419 Anxiety disorder, unspecified: Secondary | ICD-10-CM | POA: Diagnosis not present

## 2019-12-19 DIAGNOSIS — U071 COVID-19: Secondary | ICD-10-CM | POA: Diagnosis not present

## 2019-12-19 DIAGNOSIS — J1282 Pneumonia due to coronavirus disease 2019: Secondary | ICD-10-CM

## 2019-12-19 DIAGNOSIS — I10 Essential (primary) hypertension: Secondary | ICD-10-CM

## 2019-12-19 DIAGNOSIS — F988 Other specified behavioral and emotional disorders with onset usually occurring in childhood and adolescence: Secondary | ICD-10-CM | POA: Diagnosis not present

## 2019-12-19 DIAGNOSIS — R5383 Other fatigue: Secondary | ICD-10-CM

## 2019-12-19 DIAGNOSIS — R55 Syncope and collapse: Secondary | ICD-10-CM

## 2019-12-19 DIAGNOSIS — R739 Hyperglycemia, unspecified: Secondary | ICD-10-CM

## 2019-12-19 LAB — CBC WITH DIFFERENTIAL/PLATELET
Basophils Absolute: 0 10*3/uL (ref 0.0–0.1)
Basophils Relative: 0 %
Eosinophils Absolute: 0 10*3/uL (ref 0.0–0.5)
Eosinophils Relative: 0 %
HCT: 34.7 % — ABNORMAL LOW (ref 36.0–46.0)
Hemoglobin: 11.7 g/dL — ABNORMAL LOW (ref 12.0–15.0)
Lymphocytes Relative: 8 %
Lymphs Abs: 0.1 10*3/uL — ABNORMAL LOW (ref 0.7–4.0)
MCH: 30.2 pg (ref 26.0–34.0)
MCHC: 33.7 g/dL (ref 30.0–36.0)
MCV: 89.7 fL (ref 80.0–100.0)
Monocytes Absolute: 0.1 10*3/uL (ref 0.1–1.0)
Monocytes Relative: 3 %
Neutro Abs: 1.5 10*3/uL — ABNORMAL LOW (ref 1.7–7.7)
Neutrophils Relative %: 89 %
Platelets: 129 10*3/uL — ABNORMAL LOW (ref 150–400)
RBC: 3.87 MIL/uL (ref 3.87–5.11)
RDW: 12.8 % (ref 11.5–15.5)
WBC: 1.7 10*3/uL — ABNORMAL LOW (ref 4.0–10.5)
nRBC: 0 % (ref 0.0–0.2)

## 2019-12-19 LAB — COMPREHENSIVE METABOLIC PANEL
ALT: 41 U/L (ref 0–44)
AST: 50 U/L — ABNORMAL HIGH (ref 15–41)
Albumin: 2.9 g/dL — ABNORMAL LOW (ref 3.5–5.0)
Alkaline Phosphatase: 72 U/L (ref 38–126)
Anion gap: 9 (ref 5–15)
BUN: 11 mg/dL (ref 6–20)
CO2: 19 mmol/L — ABNORMAL LOW (ref 22–32)
Calcium: 7.5 mg/dL — ABNORMAL LOW (ref 8.9–10.3)
Chloride: 108 mmol/L (ref 98–111)
Creatinine, Ser: 0.68 mg/dL (ref 0.44–1.00)
GFR calc Af Amer: >60 mL/min (ref >60)
GFR calc non Af Amer: >60 mL/min (ref >60)
Glucose, Bld: 222 mg/dL — ABNORMAL HIGH (ref 70–99)
Potassium: 3.5 mmol/L (ref 3.5–5.1)
Sodium: 136 mmol/L (ref 135–145)
Total Bilirubin: 0.6 mg/dL (ref 0.3–1.2)
Total Protein: 6 g/dL — ABNORMAL LOW (ref 6.5–8.1)

## 2019-12-19 LAB — ECHOCARDIOGRAM COMPLETE
Area-P 1/2: 3.28 cm2
Height: 65 in
S' Lateral: 2.8 cm
Weight: 3168 oz

## 2019-12-19 LAB — MAGNESIUM: Magnesium: 1.8 mg/dL (ref 1.7–2.4)

## 2019-12-19 LAB — ABO/RH: ABO/RH(D): O POS

## 2019-12-19 LAB — FERRITIN: Ferritin: 209 ng/mL (ref 11–307)

## 2019-12-19 LAB — HEMOGLOBIN A1C
Hgb A1c MFr Bld: 5.8 % — ABNORMAL HIGH (ref 4.8–5.6)
Mean Plasma Glucose: 119.76 mg/dL

## 2019-12-19 LAB — CBG MONITORING, ED
Glucose-Capillary: 170 mg/dL — ABNORMAL HIGH (ref 70–99)
Glucose-Capillary: 227 mg/dL — ABNORMAL HIGH (ref 70–99)

## 2019-12-19 LAB — PHOSPHORUS: Phosphorus: 2.3 mg/dL — ABNORMAL LOW (ref 2.5–4.6)

## 2019-12-19 LAB — D-DIMER, QUANTITATIVE: D-Dimer, Quant: 0.92 ug/mL-FEU — ABNORMAL HIGH (ref 0.00–0.50)

## 2019-12-19 LAB — C-REACTIVE PROTEIN: CRP: 6.6 mg/dL — ABNORMAL HIGH (ref <1.0)

## 2019-12-19 MED ORDER — GUAIFENESIN-DM 100-10 MG/5ML PO SYRP: 10.0000 mL | ORAL_SOLUTION | ORAL | 0 refills | Status: DC | PRN

## 2019-12-19 MED ORDER — ZINC SULFATE 220 (50 ZN) MG PO CAPS: 220.0000 mg | ORAL_CAPSULE | Freq: Every day | ORAL | 0 refills | Status: DC

## 2019-12-19 MED ORDER — DIAZEPAM 2 MG PO TABS: 1.0000 mg | ORAL_TABLET | Freq: Three times a day (TID) | ORAL | Status: DC | PRN

## 2019-12-19 MED ORDER — DEXAMETHASONE 6 MG PO TABS
6.0000 mg | ORAL_TABLET | Freq: Every day | ORAL | 0 refills | Status: DC
Start: 2019-12-19 — End: 2019-12-28

## 2019-12-19 MED ORDER — POTASSIUM CHLORIDE IN NACL 20-0.9 MEQ/L-% IV SOLN: INTRAVENOUS | Status: DC

## 2019-12-19 MED ORDER — INSULIN ASPART 100 UNIT/ML ~~LOC~~ SOLN
4.0000 [IU] | Freq: Three times a day (TID) | SUBCUTANEOUS | Status: DC
  Filled 2019-12-19: qty 1

## 2019-12-19 MED ORDER — ASCORBIC ACID 500 MG PO TABS: 500.0000 mg | ORAL_TABLET | Freq: Every day | ORAL | 1 refills | Status: DC

## 2019-12-19 MED ORDER — ALBUTEROL SULFATE HFA 108 (90 BASE) MCG/ACT IN AERS: 2.0000 | INHALATION_SPRAY | RESPIRATORY_TRACT | 1 refills | Status: DC | PRN

## 2019-12-19 MED ORDER — INSULIN ASPART 100 UNIT/ML ~~LOC~~ SOLN
0.0000 [IU] | Freq: Three times a day (TID) | SUBCUTANEOUS | Status: DC
  Administered 2019-12-19: 3 [IU] via SUBCUTANEOUS
  Administered 2019-12-19: 5 [IU] via SUBCUTANEOUS
  Filled 2019-12-19 (×2): qty 1

## 2019-12-19 MED ORDER — INSULIN ASPART 100 UNIT/ML ~~LOC~~ SOLN: 0.0000 [IU] | Freq: Every day | SUBCUTANEOUS | Status: DC

## 2019-12-19 NOTE — ED Notes (Signed)
Night shift RN reports that pt refuses to wear telemetry, BP cuff and O2 sat probe due to anxiety.

## 2019-12-19 NOTE — Telephone Encounter (Signed)
Lmtc

## 2019-12-19 NOTE — Telephone Encounter (Signed)
Patient is being released today from the hospital and she states had Covid and her sugar was 227 today.So the hospital doctor put her on insulin. She is  wanting to know if she needed anymore.Please review notes from hospital. She wants to pick up prescription if need be while she is out. [employer] Please advise

## 2019-12-19 NOTE — Telephone Encounter (Signed)
Nurses I reviewed over the doctor's notes The discharge summary does not indicate that she is being sent home on insulin Many times being on steroids in the hospital will cause her sugars to go up Often when stopping the steroids they will come back down It would be prudent for the patient to have a glucometer with strips to check sugars no more than once daily She can check them sometimes in the morning sometimes before supper She should see sugars gradually come down over the course of the next several weeks Follow-up office visit in 10 to 14 days recommended Metabolic 7, CBC, B0F to be completed before follow-up office visit Healthy diet minimizing starches avoiding sugary foods starchy foods and sugary drinks  If worsening issues or further problems notify us

## 2019-12-19 NOTE — Telephone Encounter (Signed)
Patient responded on mychart and Dr. Bary Leriche message was sent to her. Advised to call if she needed anything or had any questions.

## 2019-12-19 NOTE — ED Notes (Signed)
Pt took a shower. Pt accidentally pulled IV

## 2019-12-19 NOTE — ED Notes (Signed)
Pt. Does not normally take insulin at home. Pts. Blood glucose is 227. Confirmed with doctor to give pt. Insulin prior to discharge.

## 2019-12-19 NOTE — Discharge Summary (Signed)
Physician Discharge Summary  ELLIS KOFFLER OQH:[id number] DOB: 1971/02/27 DOA: 12/18/2019  PCP: Kathyrn Drown, MD  Admit date: 12/18/2019 Discharge date: 12/19/2019  Admitted From:  Home  Disposition:  Home   Recommendations for Outpatient Follow-up:  1. Follow up with PCP in 2 weeks  Discharge Condition: STABLE   CODE STATUS: FULL    Brief Hospitalization Summary: Please see all hospital notes, images, labs for full details of the hospitalization. ADMISSION HPI: Theresa [name] is a 49 y.o. female with medical history significant for hypertension, depression and anxiety, fibromyalgia, OCD's.  Also seizures, but she has not had a seizure since she was 49 years old.  She is not on medications for seizures. Patient presented to the ED today with complaints of multiple episodes of vomiting over the past several days with poor oral intake.  She has vomited 5 times in the past 4-1/2 hours.  Also reported no urine output in 2 days.  On attempts at standing, patient reports that she passed out multiple times.  Patient tested positive for COVID-19 infection 11 days ago, this is her 13th day of symptoms.  She reports cough that started here in the ED.  No difficulty breathing.  Patient did not get the vaccine or outpatient monoclonal antibody infusion.  ED Course: Temperature 100.1, blood pressure down to 83/57 improved with 2 L O2.  On EDP evaluation, O2 sats dropped to 89% on room air with movement, 92% at rest..  Lactic acid 1.1.  0.1.  Elevation in some inflammatory markers LDH, D-dimer and CRP.  Portable chest x-ray shows bilateral pneumonia consistent with COVID-19.  Hospitalist called to admit for COVID-19 pneumonia with acute respiratory failure and syncope.  Pt was admitted for treatment of covid pneumonia.  She was briefly on supplemental oxygen but has since been weaned to room air.  Pt has repeatedly and consistently refused remdesivir treatments after being counseled multiple times.   Pt was given IV dexamethasone.  Pt refused the telemetry monitoring.  She was given albuterol inhalers and mucolytics and multivitamins and zinc.  Discharge home on oral dexamethasone.   Pt advised to return to ER if symptoms worsen.  Pt verbalized understanding.    Orthostatic syncope.  She was noted to have mild dehydration and treated with IV fluids.  She responded well to IV fluids.  She is feeling better.  2D echo was ordered and results below.    Hypotension - BP rebounded after IV fluids.   ADHD / GAD - resume home meds.    2D Echocardiogram 12/19/19 IMPRESSIONS  1. Left ventricular ejection fraction, by estimation, is 65 to 70%. The left ventricle has normal function. The left ventricle has no regional wall motion abnormalities. Left ventricular diastolic parameters were normal.  2. Right ventricular systolic function is normal. The right ventricular size is normal. There is normal pulmonary artery systolic pressure. The estimated right ventricular systolic pressure is 54.6 mmHg.  3. The mitral valve is grossly normal. Trivial mitral valve regurgitation.  4. The aortic valve is tricuspid. Aortic valve regurgitation is not visualized.  5. The inferior vena cava is normal in size with greater than 50% respiratory variability, suggesting right atrial pressure of 3 mmHg.   Discharge Diagnoses:  Principal Problem:   Pneumonia due to COVID-19 virus Active Problems:   Essential hypertension, benign   Anxiety   Attention deficit disorder (ADD) without hyperactivity  Discharge Instructions:  Allergies as of 12/19/2019      Reactions   Fentanyl  Other (See Comments)   Doesn't like the way it feels       Medication List    TAKE these medications   albuterol 108 (90 Base) MCG/ACT inhaler Commonly known as: VENTOLIN HFA Inhale 2 puffs into the lungs every 4 (four) hours as needed for wheezing or shortness of breath.   amphetamine-dextroamphetamine 10 MG 24 hr capsule Commonly  known as: ADDERALL XR Take 1 capsule (10 mg total) by mouth daily.   ascorbic acid 500 MG tablet Commonly known as: VITAMIN C Take 1 tablet (500 mg total) by mouth daily. Start taking on: December 20, 2019   dexamethasone 6 MG tablet Commonly known as: DECADRON Take 1 tablet (6 mg total) by mouth daily for 8 days.   diazepam 5 MG tablet Commonly known as: VALIUM TAKE 1/2 TO 1 TABLET BY MOUTH ONCE DAILY AS NEEDED FOR ANXIETY What changed: See the new instructions.   estradiol 1 MG tablet Commonly known as: ESTRACE Take 1 tablet (1 mg total) by mouth daily.   guaiFENesin-dextromethorphan 100-10 MG/5ML syrup Commonly known as: ROBITUSSIN DM Take 10 mLs by mouth every 4 (four) hours as needed for cough.   Magnesium 400 MG Caps Take 400 mg by mouth daily.   multivitamin tablet Take 1 tablet by mouth daily.   pantoprazole 40 MG tablet Commonly known as: PROTONIX Take 1 tablet (40 mg total) by mouth daily.   promethazine 25 MG tablet Commonly known as: PHENERGAN Take one half to one whole tablet every 8 hours prn. Caution drowiness   rizatriptan 10 MG disintegrating tablet Commonly known as: MAXALT-MLT TAKE 1 TABLET AS NEEDED FOR MIGRAIN, MAY REPEAT IN 2 HOURS MAX 2/24 HOURS What changed:   how much to take  how to take this  when to take this   valACYclovir 1000 MG tablet Commonly known as: VALTREX Take 2 tablets now and take the other 2 tablets 12 hours later.   zinc sulfate 220 (50 Zn) MG capsule Take 1 capsule (220 mg total) by mouth daily. Start taking on: December 20, 2019       Follow-up Information    Luking, Elayne Snare, MD. Schedule an appointment as soon as possible for a visit in 1 week(s).   Specialty: Family Medicine Contact information: [place] [address] [phone]              Allergies  Allergen Reactions  . Fentanyl Other (See Comments)    Doesn't like the way it feels    Allergies as of 12/19/2019       Reactions   Fentanyl Other (See Comments)   Doesn't like the way it feels       Medication List    TAKE these medications   albuterol 108 (90 Base) MCG/ACT inhaler Commonly known as: VENTOLIN HFA Inhale 2 puffs into the lungs every 4 (four) hours as needed for wheezing or shortness of breath.   amphetamine-dextroamphetamine 10 MG 24 hr capsule Commonly known as: ADDERALL XR Take 1 capsule (10 mg total) by mouth daily.   ascorbic acid 500 MG tablet Commonly known as: VITAMIN C Take 1 tablet (500 mg total) by mouth daily. Start taking on: December 20, 2019   dexamethasone 6 MG tablet Commonly known as: DECADRON Take 1 tablet (6 mg total) by mouth daily for 8 days.   diazepam 5 MG tablet Commonly known as: VALIUM TAKE 1/2 TO 1 TABLET BY MOUTH ONCE DAILY AS NEEDED FOR ANXIETY What changed:  See the new instructions.   estradiol 1 MG tablet Commonly known as: ESTRACE Take 1 tablet (1 mg total) by mouth daily.   guaiFENesin-dextromethorphan 100-10 MG/5ML syrup Commonly known as: ROBITUSSIN DM Take 10 mLs by mouth every 4 (four) hours as needed for cough.   Magnesium 400 MG Caps Take 400 mg by mouth daily.   multivitamin tablet Take 1 tablet by mouth daily.   pantoprazole 40 MG tablet Commonly known as: PROTONIX Take 1 tablet (40 mg total) by mouth daily.   promethazine 25 MG tablet Commonly known as: PHENERGAN Take one half to one whole tablet every 8 hours prn. Caution drowiness   rizatriptan 10 MG disintegrating tablet Commonly known as: MAXALT-MLT TAKE 1 TABLET AS NEEDED FOR MIGRAIN, MAY REPEAT IN 2 HOURS MAX 2/24 HOURS What changed:   how much to take  how to take this  when to take this   valACYclovir 1000 MG tablet Commonly known as: VALTREX Take 2 tablets now and take the other 2 tablets 12 hours later.   zinc sulfate 220 (50 Zn) MG capsule Take 1 capsule (220 mg total) by mouth daily. Start taking on: December 20, 2019      Procedures/Studies: [facility]  Chest [place] 1 View  Result Date: 12/18/2019 CLINICAL DATA:  Hypotension, COVID-19 positive, shortness of breath and cough EXAM: PORTABLE CHEST 1 VIEW COMPARISON:  None. FINDINGS: Single frontal view of the chest demonstrates an unremarkable cardiac silhouette. There is mild diffuse interstitial prominence, with patchy bilateral ground-glass airspace disease consistent with multifocal pneumonia and diagnosis of COVID-19. No effusion or pneumothorax. No acute bony abnormalities. IMPRESSION: 1. Multifocal bilateral pneumonia consistent with COVID 19. Electronically Signed   By: Randa Ngo M.D.   On: 12/18/2019 17:20   ECHOCARDIOGRAM COMPLETE  Result Date: 12/19/2019    ECHOCARDIOGRAM REPORT   Patient Name:   BRIXTON SCHNAPP Date of Exam: 12/19/2019 Medical Rec #:  [id number]       Height:       65.0 in Accession #:    [id number]      Weight:       198.0 lb Date of Birth:  February 05, 1971       BSA:          1.970 m Patient Age:    30 years        BP:           122/84 mmHg Patient Gender: F               HR:           83 bpm. Exam Location:  [facility] Procedure: 2D Echo Indications:    Syncope 780.2 / R55  History:        Patient has no prior history of Echocardiogram examinations.                 Risk Factors:Non-Smoker and Hypertension. Covid 19 positive.  Sonographer:    Leavy Cella RDCS (AE) Referring Phys: Speculator  1. Left ventricular ejection fraction, by estimation, is 65 to 70%. The left ventricle has normal function. The left ventricle has no regional wall motion abnormalities. Left ventricular diastolic parameters were normal.  2. Right ventricular systolic function is normal. The right ventricular size is normal. There is normal pulmonary artery systolic pressure. The estimated right ventricular systolic pressure is 26.9 mmHg.  3. The mitral valve is grossly normal. Trivial mitral valve regurgitation.  4. The aortic valve is tricuspid.  Aortic valve regurgitation is not  visualized.  5. The inferior vena cava is normal in size with greater than 50% respiratory variability, suggesting right atrial pressure of 3 mmHg. FINDINGS  Left Ventricle: Left ventricular ejection fraction, by estimation, is 65 to 70%. The left ventricle has normal function. The left ventricle has no regional wall motion abnormalities. The left ventricular internal cavity size was normal in size. There is  no left ventricular hypertrophy. Left ventricular diastolic parameters were normal. Right Ventricle: The right ventricular size is normal. No increase in right ventricular wall thickness. Right ventricular systolic function is normal. There is normal pulmonary artery systolic pressure. The tricuspid regurgitant velocity is 1.73 m/s, and  with an assumed right atrial pressure of 3 mmHg, the estimated right ventricular systolic pressure is 55.9 mmHg. Left Atrium: Left atrial size was normal in size. Right Atrium: Right atrial size was normal in size. Pericardium: There is no evidence of pericardial effusion. Mitral Valve: The mitral valve is grossly normal. Trivial mitral valve regurgitation. Tricuspid Valve: The tricuspid valve is grossly normal. Tricuspid valve regurgitation is trivial. Aortic Valve: The aortic valve is tricuspid. Aortic valve regurgitation is not visualized. Mild aortic valve annular calcification. Pulmonic Valve: The pulmonic valve was grossly normal. Pulmonic valve regurgitation is trivial. Aorta: The aortic root is normal in size and structure. Venous: The inferior vena cava is normal in size with greater than 50% respiratory variability, suggesting right atrial pressure of 3 mmHg. IAS/Shunts: No atrial level shunt detected by color flow Doppler.  LEFT VENTRICLE PLAX 2D LVIDd:         4.77 cm  Diastology LVIDs:         2.80 cm  LV e' lateral:   10.00 cm/s LV PW:         1.19 cm  LV E/e' lateral: 7.8 LV IVS:        1.02 cm  LV e' medial:    8.38 cm/s LVOT diam:     2.00 cm  LV E/e' medial:   9.3 LVOT Area:     3.14 cm  RIGHT VENTRICLE RV S prime:     11.60 cm/s TAPSE (M-mode): 2.2 cm LEFT ATRIUM             Index       RIGHT ATRIUM           Index LA diam:        3.70 cm 1.88 cm/m  RA Area:     10.40 cm LA Vol (A2C):   43.0 ml 21.83 ml/m RA Volume:   21.60 ml  10.97 ml/m LA Vol (A4C):   40.8 ml 20.71 ml/m LA Biplane Vol: 44.0 ml 22.34 ml/m   AORTA Ao Root diam: 3.10 cm MITRAL VALVE               TRICUSPID VALVE MV Area (PHT): 3.28 cm    TR Peak grad:   12.0 mmHg MV Decel Time: 231 msec    TR Vmax:        173.00 cm/s MV E velocity: 78.00 cm/s MV A velocity: 56.10 cm/s  SHUNTS MV E/A ratio:  1.39        Systemic Diam: 2.00 cm Rozann Lesches MD Electronically signed by Rozann Lesches MD Signature Date/Time: 12/19/2019/10:26:56 AM    Final       Subjective: Pt without complaints.  Pt still refusing remdesivir.  Pt refusing telemetry monitoring.  Pt wants to go home.    Discharge Exam:  Vitals:   12/19/19 0924 12/19/19 0940  BP: 122/84   Pulse: 83   Resp: 18   Temp:    SpO2: 97% 96%   Vitals:   12/19/19 0519 12/19/19 0826 12/19/19 0924 12/19/19 0940  BP:   122/84   Pulse: 77  83   Resp: 20  18   Temp:      TempSrc:      SpO2: 94% 94% 97% 96%  Weight:      Height:       General: Pt is alert, awake, not in acute distress Cardiovascular: normal S1/S2 + Respiratory: no increased WOB.    The results of significant diagnostics from this hospitalization (including imaging, microbiology, ancillary and laboratory) are listed below for reference.     Microbiology: Recent Results (from the past 240 hour(s))  Blood Culture (routine x 2)     Status: None (Preliminary result)   Collection Time: 12/18/19  2:40 PM   Specimen: Left Antecubital; Blood  Result Value Ref Range Status   Specimen Description LEFT ANTECUBITAL  Final   Special Requests   Final    BOTTLES DRAWN AEROBIC ONLY Blood Culture adequate volume   Culture   Final    NO GROWTH < 24 HOURS Performed at [facility], [address], [place], [place] [address]    Report Status PENDING  Incomplete  Blood Culture (routine x 2)     Status: None (Preliminary result)   Collection Time: 12/18/19  6:01 PM   Specimen: Left Antecubital; Blood  Result Value Ref Range Status   Specimen Description LEFT ANTECUBITAL  Final   Special Requests   Final    BOTTLES DRAWN AEROBIC AND ANAEROBIC Blood Culture adequate volume   Culture   Final    NO GROWTH < 24 HOURS Performed at [facility], [address], [place], [place] [address]    Report Status PENDING  Incomplete  SARS Coronavirus 2 by RT PCR (hospital order, performed in [place] hospital lab) Nasopharyngeal Nasopharyngeal Swab     Status: Abnormal   Collection Time: 12/18/19  6:12 PM   Specimen: Nasopharyngeal Swab  Result Value Ref Range Status   SARS Coronavirus 2 POSITIVE (A) NEGATIVE Final    Comment: RESULT CALLED TO, READ BACK BY AND VERIFIED WITH: S WOODS,RN @1956  12/18/19 MKELLY (NOTE) SARS-CoV-2 target nucleic acids are DETECTED  SARS-CoV-2 RNA is generally detectable in upper respiratory specimens  during the acute phase of infection.  Positive results are indicative  of the presence of the identified virus, but do not rule out bacterial infection or co-infection with other pathogens not detected by the test.  Clinical correlation with patient history and  other diagnostic information is necessary to determine patient infection status.  The expected result is negative.  Fact Sheet for Patients:   StrictlyIdeas.no   Fact Sheet for Healthcare Providers:   BankingDealers.co.za    This test is not yet approved or cleared by the [place] FDA and  has been authorized for detection and/or diagnosis of SARS-CoV-2 by FDA under an Emergency Use Authorization (EUA).  This EUA will remain in effect (meaning this test  can be used) for the duration of  the COVID-19 declaration under Section  564(b)(1) of the Act, 21 U.S.C. section 360-bbb-3(b)(1), unless the authorization is terminated or revoked sooner.  Performed at [facility], [address], [place], [place] [address]      Labs: BNP (last 3 results) No results for input(s): BNP in the  last 8760 hours. Basic Metabolic Panel: Recent Labs  Lab 12/18/19 1437 12/19/19 0537  NA 133* 136  K 3.7 3.5  CL 98 108  CO2 22 19*  GLUCOSE 147* 222*  BUN 11 11  CREATININE 0.83 0.68  CALCIUM 8.1* 7.5*  MG  --  1.8  PHOS  --  2.3*   Liver Function Tests: Recent Labs  Lab 12/18/19 1437 12/19/19 0537  AST 63* 50*  ALT 45* 41  ALKPHOS 81 72  BILITOT 0.9 0.6  PROT 7.2 6.0*  ALBUMIN 3.6 2.9*   Recent Labs  Lab 12/18/19 1437  LIPASE 19   No results for input(s): AMMONIA in the last 168 hours. CBC: Recent Labs  Lab 12/18/19 1437 12/19/19 0537  WBC 4.3 1.7*  NEUTROABS  --  1.5*  HGB 13.5 11.7*  HCT 39.8 34.7*  MCV 89.0 89.7  PLT 134* 129*   Cardiac Enzymes: No results for input(s): CKTOTAL, CKMB, CKMBINDEX, TROPONINI in the last 168 hours. BNP: Invalid input(s): POCBNP CBG: Recent Labs  Lab 12/19/19 0814  GLUCAP 170*   D-Dimer Recent Labs    12/18/19 1801 12/19/19 0537  DDIMER 0.92* 0.92*   Hgb A1c No results for input(s): HGBA1C in the last 72 hours. Lipid Profile Recent Labs    12/18/19 1801  TRIG 30   Thyroid function studies No results for input(s): TSH, T4TOTAL, T3FREE, THYROIDAB in the last 72 hours.  Invalid input(s): FREET3 Anemia work up Recent Labs    12/18/19 1801 12/19/19 0537  FERRITIN 224 209   Urinalysis    Component Value Date/Time   COLORURINE YELLOW 12/18/2019 [place] 12/18/2019 1327   LABSPEC 1.016 12/18/2019 1327   PHURINE 6.0 12/18/2019 1327   GLUCOSEU NEGATIVE 12/18/2019 1327   HGBUR NEGATIVE 12/18/2019 1327   BILIRUBINUR NEGATIVE 12/18/2019 1327   KETONESUR 5 (A) 12/18/2019 1327   PROTEINUR 30 (A) 12/18/2019 1327   NITRITE NEGATIVE  12/18/2019 1327   LEUKOCYTESUR NEGATIVE 12/18/2019 1327   Sepsis Labs Invalid input(s): PROCALCITONIN,  WBC,  LACTICIDVEN Microbiology Recent Results (from the past 240 hour(s))  Blood Culture (routine x 2)     Status: None (Preliminary result)   Collection Time: 12/18/19  2:40 PM   Specimen: Left Antecubital; Blood  Result Value Ref Range Status   Specimen Description LEFT ANTECUBITAL  Final   Special Requests   Final    BOTTLES DRAWN AEROBIC ONLY Blood Culture adequate volume   Culture   Final    NO GROWTH < 24 HOURS Performed at [facility], [address] [place], [place] [address]    Report Status PENDING  Incomplete  Blood Culture (routine x 2)     Status: None (Preliminary result)   Collection Time: 12/18/19  6:01 PM   Specimen: Left Antecubital; Blood  Result Value Ref Range Status   Specimen Description LEFT ANTECUBITAL  Final   Special Requests   Final    BOTTLES DRAWN AEROBIC AND ANAEROBIC Blood Culture adequate volume   Culture   Final    NO GROWTH < 24 HOURS Performed at [facility], [address], [place],  [address]    Report Status PENDING  Incomplete  SARS Coronavirus 2 by RT PCR (hospital order, performed in [place] hospital lab) Nasopharyngeal Nasopharyngeal Swab     Status: Abnormal   Collection Time: 12/18/19  6:12 PM   Specimen: Nasopharyngeal Swab  Result Value Ref Range Status   SARS Coronavirus 2 POSITIVE (A) NEGATIVE Final  Comment: RESULT CALLED TO, READ BACK BY AND VERIFIED WITH: S WOODS,RN @1956  12/18/19 MKELLY (NOTE) SARS-CoV-2 target nucleic acids are DETECTED  SARS-CoV-2 RNA is generally detectable in upper respiratory specimens  during the acute phase of infection.  Positive results are indicative  of the presence of the identified virus, but do not rule out bacterial infection or co-infection with other pathogens not detected by the test.  Clinical correlation with patient history and  other diagnostic information is  necessary to determine patient infection status.  The expected result is negative.  Fact Sheet for Patients:   StrictlyIdeas.no   Fact Sheet for Healthcare Providers:   BankingDealers.co.za    This test is not yet approved or cleared by the [place] FDA and  has been authorized for detection and/or diagnosis of SARS-CoV-2 by FDA under an Emergency Use Authorization (EUA).  This EUA will remain in effect (meaning this test  can be used) for the duration of  the COVID-19 declaration under Section 564(b)(1) of the Act, 21 U.S.C. section 360-bbb-3(b)(1), unless the authorization is terminated or revoked sooner.  Performed at [facility], [address] [place], [place] [address]    Time coordinating discharge:   SIGNED:  Irwin Brakeman, MD  [facility] 12/19/2019, 11:01 AM How to contact the [facility] Attending or Consulting provider [place] or covering provider during after hours [place], for this patient?  1. Check the care team in [facility] and look for a) attending/consulting [facility] provider listed and b) the [facility] team listed 2. Log into [email or web address] and use [place] universal password to access. If you do not have the password, please contact the hospital operator. 3. Locate the [facility] provider you are looking for under [facility] and page to a number that you can be directly reached. 4. If you still have difficulty reaching the provider, please page the [facility] (Director on Call) for the Hospitalists listed on amion for assistance.

## 2019-12-19 NOTE — Progress Notes (Signed)
12/19/2019 8:49 AM  Pt continues to refuse remdesivir.  Says she wants to discharge. Counseled patient at bedside regarding risks, etc, continues to refuse remdesivir.  She says she is at the end of her disease course for Covid.   Echo pending.  She may refuse the test.  Plan to DC later today.    Murvin Natal MD

## 2019-12-19 NOTE — Discharge Instructions (Signed)
10 Things You Can Do to Manage Your COVID-19 Symptoms at Home If you have possible or confirmed COVID-19: 1. Stay home from work and school. And stay away from other public places. If you must go out, avoid using any kind of public transportation, ridesharing, or taxis. 2. Monitor your symptoms carefully. If your symptoms get worse, call your healthcare provider immediately. 3. Get rest and stay hydrated. 4. If you have a medical appointment, call the healthcare provider ahead of time and tell them that you have or may have COVID-19. 5. For medical emergencies, call 911 and notify the dispatch personnel that you have or may have COVID-19. 6. Cover your cough and sneezes with a tissue or use the inside of your elbow. 7. Wash your hands often with soap and water for at least 20 seconds or clean your hands with an alcohol-based hand sanitizer that contains at least 60% alcohol. 8. As much as possible, stay in a specific room and away from other people in your home. Also, you should use a separate bathroom, if available. If you need to be around other people in or outside of the home, wear a mask. 9. Avoid sharing personal items with other people in your household, like dishes, towels, and bedding. 10. Clean all surfaces that are touched often, like counters, tabletops, and doorknobs. Use household cleaning sprays or wipes according to the label instructions. [email or web address] 11/01/2018 This information is not intended to replace advice given to you by your health care provider. Make sure you discuss any questions you have with your health care provider. Document Revised: 04/05/2019 Document Reviewed: 04/05/2019 Elsevier Patient Education  [place].  COVID-19: How to Protect Yourself and Others Know how it spreads  There is currently no vaccine to prevent coronavirus disease 2019 (COVID-19).  The best way to prevent illness is to avoid being exposed to this virus.  The virus is  thought to spread mainly from person-to-person. ? Between people who are in close contact with one another (within about 6 feet). ? Through respiratory droplets produced when an infected person coughs, sneezes or talks. ? These droplets can land in the mouths or noses of people who are nearby or possibly be inhaled into the lungs. ? COVID-19 may be spread by people who are not showing symptoms. Everyone should Clean your hands often  Wash your hands often with soap and water for at least 20 seconds especially after you have been in a public place, or after blowing your nose, coughing, or sneezing.  If soap and water are not readily available, use a hand sanitizer that contains at least 60% alcohol. Cover all surfaces of your hands and rub them together until they feel dry.  Avoid touching your eyes, nose, and mouth with unwashed hands. Avoid close contact  Limit contact with others as much as possible.  Avoid close contact with people who are sick.  Put distance between yourself and other people. ? Remember that some people without symptoms may be able to spread virus. ? This is especially important for people who are at higher risk of getting very GainPain.com.cy Cover your mouth and nose with a mask when around others  You could spread COVID-19 to others even if you do not feel sick.  Everyone should wear a mask in public settings and when around people not living in their household, especially when social distancing is difficult to maintain. ? Masks should not be placed on young children under age 48, anyone who  has trouble breathing, or is unconscious, incapacitated or otherwise unable to remove the mask without assistance.  The mask is meant to protect other people in case you are infected.  Do NOT use a facemask meant for a Dietitian.  Continue to keep about 6 feet between yourself and others. The  mask is not a substitute for social distancing. Cover coughs and sneezes  Always cover your mouth and nose with a tissue when you cough or sneeze or use the inside of your elbow.  Throw used tissues in the trash.  Immediately wash your hands with soap and water for at least 20 seconds. If soap and water are not readily available, clean your hands with a hand sanitizer that contains at least 60% alcohol. Clean and disinfect  Clean AND disinfect frequently touched surfaces daily. This includes tables, doorknobs, light switches, countertops, handles, desks, phones, keyboards, toilets, faucets, and sinks. [email or web address]  If surfaces are dirty, clean them: Use detergent or soap and water prior to disinfection.  Then, use a household disinfectant. You can see a list of EPA-registered household disinfectants here. [email or web address] 01/03/2019 This information is not intended to replace advice given to you by your health care provider. Make sure you discuss any questions you have with your health care provider. Document Revised: 01/11/2019 Document Reviewed: 11/09/2018 Elsevier Patient Education  2020 [place].   COVID-19 COVID-19 is a respiratory infection that is caused by a virus called severe acute respiratory syndrome coronavirus 2 (SARS-CoV-2). The disease is also known as coronavirus disease or novel coronavirus. In some people, the virus may not cause any symptoms. In others, it may cause a serious infection. The infection can get worse quickly and can lead to complications, such as:  Pneumonia, or infection of the lungs.  Acute respiratory distress syndrome or ARDS. This is a condition in which fluid build-up in the lungs prevents the lungs from filling with air and passing oxygen into the blood.  Acute respiratory failure. This is a condition in which there is not enough oxygen passing from the lungs to the body  or when carbon dioxide is not passing from the lungs out of the body.  Sepsis or septic shock. This is a serious bodily reaction to an infection.  Blood clotting problems.  Secondary infections due to bacteria or fungus.  Organ failure. This is when your body's organs stop working. The virus that causes COVID-19 is contagious. This means that it can spread from person to person through droplets from coughs and sneezes (respiratory secretions). What are the causes? This illness is caused by a virus. You may catch the virus by:  Breathing in droplets from an infected person. Droplets can be spread by a person breathing, speaking, singing, coughing, or sneezing.  Touching something, like a table or a doorknob, that was exposed to the virus (contaminated) and then touching your mouth, nose, or eyes. What increases the risk? Risk for infection You are more likely to be infected with this virus if you:  Are within 6 feet (2 meters) of a person with COVID-19.  Provide care for or live with a person who is infected with COVID-19.  Spend time in crowded indoor spaces or live in shared housing. Risk for serious illness You are more likely to become seriously ill from the virus if you:  Are 26 years of age or older. The higher your age, the more you are at risk for serious illness.  Live in a nursing home or long-term care  facility.  Have cancer.  Have a long-term (chronic) disease such as: ? Chronic lung disease, including chronic obstructive pulmonary disease or asthma. ? A long-term disease that lowers your body's ability to fight infection (immunocompromised). ? Heart disease, including heart failure, a condition in which the arteries that lead to the heart become narrow or blocked (coronary artery disease), a disease which makes the heart muscle thick, weak, or stiff (cardiomyopathy). ? Diabetes. ? Chronic kidney disease. ? Sickle cell disease, a condition in which red blood cells  have an abnormal "sickle" shape. ? Liver disease.  Are obese. What are the signs or symptoms? Symptoms of this condition can range from mild to severe. Symptoms may appear any time from 2 to 14 days after being exposed to the virus. They include:  A fever or chills.  A cough.  Difficulty breathing.  Headaches, body aches, or muscle aches.  Runny or stuffy (congested) nose.  A sore throat.  New loss of taste or smell. Some people may also have stomach problems, such as nausea, vomiting, or diarrhea. Other people may not have any symptoms of COVID-19. How is this diagnosed? This condition may be diagnosed based on:  Your signs and symptoms, especially if: ? You live in an area with a COVID-19 outbreak. ? You recently traveled to or from an area where the virus is common. ? You provide care for or live with a person who was diagnosed with COVID-19. ? You were exposed to a person who was diagnosed with COVID-19.  A physical exam.  Lab tests, which may include: ? Taking a sample of fluid from the back of your nose and throat (nasopharyngeal fluid), your nose, or your throat using a swab. ? A sample of mucus from your lungs (sputum). ? Blood tests.  Imaging tests, which may include, X-rays, CT scan, or ultrasound. How is this treated? At present, there is no medicine to treat COVID-19. Medicines that treat other diseases are being used on a trial basis to see if they are effective against COVID-19. Your health care provider will talk with you about ways to treat your symptoms. For most people, the infection is mild and can be managed at home with rest, fluids, and over-the-counter medicines. Treatment for a serious infection usually takes places in a hospital intensive care unit (ICU). It may include one or more of the following treatments. These treatments are given until your symptoms improve.  Receiving fluids and medicines through an IV.  Supplemental oxygen. Extra oxygen  is given through a tube in the nose, a face mask, or a hood.  Positioning you to lie on your stomach (prone position). This makes it easier for oxygen to get into the lungs.  Continuous positive airway pressure (CPAP) or bi-level positive airway pressure (BPAP) machine. This treatment uses mild air pressure to keep the airways open. A tube that is connected to a motor delivers oxygen to the body.  Ventilator. This treatment moves air into and out of the lungs by using a tube that is placed in your windpipe.  Tracheostomy. This is a procedure to create a hole in the neck so that a breathing tube can be inserted.  Extracorporeal membrane oxygenation (ECMO). This procedure gives the lungs a chance to recover by taking over the functions of the heart and lungs. It supplies oxygen to the body and removes carbon dioxide. Follow these instructions at home: Lifestyle  If you are sick, stay home except to get medical care. Your health  care provider will tell you how long to stay home. Call your health care provider before you go for medical care.  Rest at home as told by your health care provider.  Do not use any products that contain nicotine or tobacco, such as cigarettes, e-cigarettes, and chewing tobacco. If you need help quitting, ask your health care provider.  Return to your normal activities as told by your health care provider. Ask your health care provider what activities are safe for you. General instructions  Take over-the-counter and prescription medicines only as told by your health care provider.  Drink enough fluid to keep your urine pale yellow.  Keep all follow-up visits as told by your health care provider. This is important. How is this prevented?  There is no vaccine to help prevent COVID-19 infection. However, there are steps you can take to protect yourself and others from this virus. To protect yourself:   Do not travel to areas where COVID-19 is a risk. The areas  where COVID-19 is reported change often. To identify high-risk areas and travel restrictions, check the CDC travel website: FatFares.com.br  If you live in, or must travel to, an area where COVID-19 is a risk, take precautions to avoid infection. ? Stay away from people who are sick. ? Wash your hands often with soap and water for 20 seconds. If soap and water are not available, use an alcohol-based hand sanitizer. ? Avoid touching your mouth, face, eyes, or nose. ? Avoid going out in public, follow guidance from your state and local health authorities. ? If you must go out in public, wear a cloth face covering or face mask. Make sure your mask covers your nose and mouth. ? Avoid crowded indoor spaces. Stay at least 6 feet (2 meters) away from others. ? Disinfect objects and surfaces that are frequently touched every day. This may include:  Counters and tables.  Doorknobs and light switches.  Sinks and faucets.  Electronics, such as phones, remote controls, keyboards, computers, and tablets. To protect others: If you have symptoms of COVID-19, take steps to prevent the virus from spreading to others.  If you think you have a COVID-19 infection, contact your health care provider right away. Tell your health care team that you think you may have a COVID-19 infection.  Stay home. Leave your house only to seek medical care. Do not use public transport.  Do not travel while you are sick.  Wash your hands often with soap and water for 20 seconds. If soap and water are not available, use alcohol-based hand sanitizer.  Stay away from other members of your household. Let healthy household members care for children and pets, if possible. If you have to care for children or pets, wash your hands often and wear a mask. If possible, stay in your own room, separate from others. Use a different bathroom.  Make sure that all people in your household wash their hands well and  often.  Cough or sneeze into a tissue or your sleeve or elbow. Do not cough or sneeze into your hand or into the air.  Wear a cloth face covering or face mask. Make sure your mask covers your nose and mouth. Where to find more information  Centers for Disease Control and Prevention: PurpleGadgets.be  World Health Organization: https://www.castaneda.info/ Contact a health care provider if:  You live in or have traveled to an area where COVID-19 is a risk and you have symptoms of the infection.  You have  had contact with someone who has COVID-19 and you have symptoms of the infection. Get help right away if:  You have trouble breathing.  You have pain or pressure in your chest.  You have confusion.  You have bluish lips and fingernails.  You have difficulty waking from sleep.  You have symptoms that get worse. These symptoms may represent a serious problem that is an emergency. Do not wait to see if the symptoms will go away. Get medical help right away. Call your local emergency services (911 in the [place] Do not drive yourself to the hospital. Let the emergency medical personnel know if you think you have COVID-19. Summary  COVID-19 is a respiratory infection that is caused by a virus. It is also known as coronavirus disease or novel coronavirus. It can cause serious infections, such as pneumonia, acute respiratory distress syndrome, acute respiratory failure, or sepsis.  The virus that causes COVID-19 is contagious. This means that it can spread from person to person through droplets from breathing, speaking, singing, coughing, or sneezing.  You are more likely to develop a serious illness if you are 33 years of age or older, have a weak immune system, live in a nursing home, or have chronic disease.  There is no medicine to treat COVID-19. Your health care provider will talk with you about ways to treat your symptoms.  Take steps to  protect yourself and others from infection. Wash your hands often and disinfect objects and surfaces that are frequently touched every day. Stay away from people who are sick and wear a mask if you are sick. This information is not intended to replace advice given to you by your health care provider. Make sure you discuss any questions you have with your health care provider. Document Revised: 02/16/2019 Document Reviewed: 05/25/2018 Elsevier Patient Education  2020 [employer].   COVID-19 Frequently Asked Questions COVID-19 (coronavirus disease) is an infection that is caused by a large family of viruses. Some viruses cause illness in people and others cause illness in animals like camels, cats, and bats. In some cases, the viruses that cause illness in animals can spread to humans. Where did the coronavirus come from? In December 2019, [place] told the [facility]) of several cases of lung disease (human respiratory illness). These cases were linked to an open seafood and livestock market in the city of [place]. The link to the seafood and livestock market suggests that the virus may have spread from animals to humans. However, since that first outbreak in December, the virus has also been shown to spread from person to person. What is the name of the disease and the virus? Disease name Early on, this disease was called novel coronavirus. This is because scientists determined that the disease was caused by a new (novel) respiratory virus. The World Health Organization [facility]) has now named the disease COVID-19, or coronavirus disease. Virus name The virus that causes the disease is called severe acute respiratory syndrome coronavirus 2 (SARS-CoV-2). More information on disease and virus naming World Health Organization [facility]): www.who.int/emergencies/diseases/novel-coronavirus-2019/technical-guidance/naming-the-coronavirus-disease-(covid-2019)-and-the-virus-that-causes-it Who is at  risk for complications from coronavirus disease? Some people may be at higher risk for complications from coronavirus disease. This includes older adults and people who have chronic diseases, such as heart disease, diabetes, and lung disease. If you are at higher risk for complications, take these extra precautions:  Stay home as much as possible.  Avoid social gatherings and travel.  Avoid close contact with others. Stay  at least 6 ft (2 m) away from others, if possible.  Wash your hands often with soap and water for at least 20 seconds.  Avoid touching your face, mouth, nose, or eyes.  Keep supplies on hand at home, such as food, medicine, and cleaning supplies.  If you must go out in public, wear a cloth face covering or face mask. Make sure your mask covers your nose and mouth. How does coronavirus disease spread? The virus that causes coronavirus disease spreads easily from person to person (is contagious). You may catch the virus by:  Breathing in droplets from an infected person. Droplets can be spread by a person breathing, speaking, singing, coughing, or sneezing.  Touching something, like a table or a doorknob, that was exposed to the virus (contaminated) and then touching your mouth, nose, or eyes. Can I get the virus from touching surfaces or objects? There is still a lot that we do not know about the virus that causes coronavirus disease. Scientists are basing a lot of information on what they know about similar viruses, such as:  Viruses cannot generally survive on surfaces for long. They need a human body (host) to survive.  It is more likely that the virus is spread by close contact with people who are sick (direct contact), such as through: ? Shaking hands or hugging. ? Breathing in respiratory droplets that travel through the air. Droplets can be spread by a person breathing, speaking, singing, coughing, or sneezing.  It is less likely that the virus is spread when a  person touches a surface or object that has the virus on it (indirect contact). The virus may be able to enter the body if the person touches a surface or object and then touches his or her face, eyes, nose, or mouth. Can a person spread the virus without having symptoms of the disease? It may be possible for the virus to spread before a person has symptoms of the disease, but this is most likely not the main way the virus is spreading. It is more likely for the virus to spread by being in close contact with people who are sick and breathing in the respiratory droplets spread by a person breathing, speaking, singing, coughing, or sneezing. What are the symptoms of coronavirus disease? Symptoms vary from person to person and can range from mild to severe. Symptoms may include:  Fever or chills.  Cough.  Difficulty breathing or feeling short of breath.  Headaches, body aches, or muscle aches.  Runny or stuffy (congested) nose.  Sore throat.  New loss of taste or smell.  Nausea, vomiting, or diarrhea. These symptoms can appear anywhere from 2 to 14 days after you have been exposed to the virus. Some people may not have any symptoms. If you develop symptoms, call your health care provider. People with severe symptoms may need hospital care. Should I be tested for this virus? Your health care provider will decide whether to test you based on your symptoms, history of exposure, and your risk factors. How does a health care provider test for this virus? Health care providers will collect samples to send for testing. Samples may include:  Taking a swab of fluid from the back of your nose and throat, your nose, or your throat.  Taking fluid from the lungs by having you cough up mucus (sputum) into a sterile cup.  Taking a blood sample. Is there a treatment or vaccine for this virus? Currently, there is no vaccine to  prevent coronavirus disease. Also, there are no medicines like antibiotics or  antivirals to treat the virus. A person who becomes sick is given supportive care, which means rest and fluids. A person may also relieve his or her symptoms by using over-the-counter medicines that treat sneezing, coughing, and runny nose. These are the same medicines that a person takes for the common cold. If you develop symptoms, call your health care provider. People with severe symptoms may need hospital care. What can I do to protect myself and my family from this virus?     You can protect yourself and your family by taking the same actions that you would take to prevent the spread of other viruses. Take the following actions:  Wash your hands often with soap and water for at least 20 seconds. If soap and water are not available, use alcohol-based hand sanitizer.  Avoid touching your face, mouth, nose, or eyes.  Cough or sneeze into a tissue, sleeve, or elbow. Do not cough or sneeze into your hand or the air. ? If you cough or sneeze into a tissue, throw it away immediately and wash your hands.  Disinfect objects and surfaces that you frequently touch every day.  Stay away from people who are sick.  Avoid going out in public, follow guidance from your state and local health authorities.  Avoid crowded indoor spaces. Stay at least 6 ft (2 m) away from others.  If you must go out in public, wear a cloth face covering or face mask. Make sure your mask covers your nose and mouth.  Stay home if you are sick, except to get medical care. Call your health care provider before you get medical care. Your health care provider will tell you how long to stay home.  Make sure your vaccines are up to date. Ask your health care provider what vaccines you need. What should I do if I need to travel? Follow travel recommendations from your local health authority, the CDC, and WHO. Travel information and advice  Centers for Disease Control and Prevention (CDC):  BodyEditor.hu  World Health Organization [facility]): ThirdIncome.ca Know the risks and take action to protect your health  You are at higher risk of getting coronavirus disease if you are traveling to areas with an outbreak or if you are exposed to travelers from areas with an outbreak.  Wash your hands often and practice good hygiene to lower the risk of catching or spreading the virus. What should I do if I am sick? General instructions to stop the spread of infection  Wash your hands often with soap and water for at least 20 seconds. If soap and water are not available, use alcohol-based hand sanitizer.  Cough or sneeze into a tissue, sleeve, or elbow. Do not cough or sneeze into your hand or the air.  If you cough or sneeze into a tissue, throw it away immediately and wash your hands.  Stay home unless you must get medical care. Call your health care provider or local health authority before you get medical care.  Avoid public areas. Do not take public transportation, if possible.  If you can, wear a mask if you must go out of the house or if you are in close contact with someone who is not sick. Make sure your mask covers your nose and mouth. Keep your home clean  Disinfect objects and surfaces that are frequently touched every day. This may include: ? Counters and tables. ? Doorknobs and light  switches. ? Sinks and faucets. ? Electronics such as phones, remote controls, keyboards, computers, and tablets.  Wash dishes in hot, soapy water or use a dishwasher. Air-dry your dishes.  Wash laundry in hot water. Prevent infecting other household members  Let healthy household members care for children and pets, if possible. If you have to care for children or pets, wash your hands often and wear a mask.  Sleep in a different bedroom or bed, if possible.  Do not share personal items, such  as razors, toothbrushes, deodorant, combs, brushes, towels, and washcloths. Where to find more information Centers for Disease Control and Prevention (CDC)  Information and news updates: https://www.butler-gonzalez.com/ World Health Organization [facility])  Information and news updates: MissExecutive.com.ee  Coronavirus health topic: https://www.castaneda.info/  Questions and answers on COVID-19: OpportunityDebt.at  Global tracker: who.sprinklr.com American Academy of Pediatrics (AAP)  Information for families: www.healthychildren.org/English/health-issues/conditions/chest-lungs/Pages/2019-Novel-Coronavirus.aspx The coronavirus situation is changing rapidly. Check your local health authority website or the CDC and [facility] websites for updates and news. When should I contact a health care provider?  Contact your health care provider if you have symptoms of an infection, such as fever or cough, and you: ? Have been near anyone who is known to have coronavirus disease. ? Have come into contact with a person who is suspected to have coronavirus disease. ? Have traveled to an area where there is an outbreak of COVID-19. When should I get emergency medical care?  Get help right away by calling your local emergency services (911 in the [place] if you have: ? Trouble breathing. ? Pain or pressure in your chest. ? Confusion. ? Blue-tinged lips and fingernails. ? Difficulty waking from sleep. ? Symptoms that get worse. Let the emergency medical personnel know if you think you have coronavirus disease. Summary  A new respiratory virus is spreading from person to person and causing COVID-19 (coronavirus disease).  The virus that causes COVID-19 appears to spread easily. It spreads from one person to another through droplets from breathing, speaking, singing, coughing, or sneezing.  Older adults and those with chronic  diseases are at higher risk of disease. If you are at higher risk for complications, take extra precautions.  There is currently no vaccine to prevent coronavirus disease. There are no medicines, such as antibiotics or antivirals, to treat the virus.  You can protect yourself and your family by washing your hands often, avoiding touching your face, and covering your coughs and sneezes. This information is not intended to replace advice given to you by your health care provider. Make sure you discuss any questions you have with your health care provider. Document Revised: 02/16/2019 Document Reviewed: 08/15/2018 Elsevier Patient Education  [place]: Quarantine vs. Isolation QUARANTINE keeps someone who was in close contact with someone who has COVID-19 away from others. If you had close contact with a person who has COVID-19  Stay home until 14 days after your last contact.  Check your temperature twice a day and watch for symptoms of COVID-19.  If possible, stay away from people who are at higher-risk for getting very sick from COVID-19. ISOLATION keeps someone who is sick or tested positive for COVID-19 without symptoms away from others, even in their own home. If you are sick and think or know you have COVID-19  Stay home until after ? At least 10 days since symptoms first appeared and ? At least 24 hours with no fever without fever-reducing medication and ? Symptoms have improved If you  tested positive for COVID-19 but do not have symptoms  Stay home until after ? 10 days have passed since your positive test If you live with others, stay in a specific "sick room" or area and away from other people or animals, including pets. Use a separate bathroom, if available. [email or web address] 11/20/2018 This information is not intended to replace advice given to you by your health care provider. Make sure you discuss any questions you have with your health care  provider. Document Revised: 04/05/2019 Document Reviewed: 04/05/2019 Elsevier Patient Education  [place].    IMPORTANT INFORMATION: PAY CLOSE ATTENTION   PHYSICIAN DISCHARGE INSTRUCTIONS  Follow with Primary care provider  Kathyrn Drown, MD  and other consultants as instructed by your Hospitalist Physician  [place] IF SYMPTOMS COME BACK, WORSEN OR NEW PROBLEM DEVELOPS   Please note: You were cared for by a hospitalist during your hospital stay. Every effort will be made to forward records to your primary care provider.  You can request that your primary care provider send for your hospital records if they have not received them.  Once you are discharged, your primary care physician will handle any further medical issues. Please note that NO REFILLS for any discharge medications will be authorized once you are discharged, as it is imperative that you return to your primary care physician (or establish a relationship with a primary care physician if you do not have one) for your post hospital discharge needs so that they can reassess your need for medications and monitor your lab values.  Please get a complete blood count and chemistry panel checked by your Primary MD at your next visit, and again as instructed by your Primary MD.  Get Medicines reviewed and adjusted: Please take all your medications with you for your next visit with your Primary MD  Laboratory/radiological data: Please request your Primary MD to go over all hospital tests and procedure/radiological results at the follow up, please ask your primary care provider to get all Hospital records sent to his/her office.  In some cases, they will be blood work, cultures and biopsy results pending at the time of your discharge. Please request that your primary care provider follow up on these results.  If you are diabetic, please bring your blood sugar readings with you to your  follow up appointment with primary care.    Please call and make your follow up appointments as soon as possible.    Also Note the following: If you experience worsening of your admission symptoms, develop shortness of breath, life threatening emergency, suicidal or homicidal thoughts you must seek medical attention immediately by calling 911 or calling your MD immediately  if symptoms less severe.  You must read complete instructions/literature along with all the possible adverse reactions/side effects for all the Medicines you take and that have been prescribed to you. Take any new Medicines after you have completely understood and accpet all the possible adverse reactions/side effects.   Do not drive when taking Pain medications or sleeping medications (Benzodiazepines)  Do not take more than prescribed Pain, Sleep and Anxiety Medications. It is not advisable to combine anxiety,sleep and pain medications without talking with your primary care practitioner  Special Instructions: If you have smoked or chewed Tobacco  in the last 2 yrs please stop smoking, stop any regular Alcohol  and or any Recreational drug use.  Wear Seat belts while driving.  Do not drive if taking  any narcotic, mind altering or controlled substances or recreational drugs or alcohol.

## 2019-12-19 NOTE — Progress Notes (Signed)
*  PRELIMINARY RESULTS* Echocardiogram 2D Echocardiogram has been performed.  Theresa [name] 12/19/2019, 9:42 AM

## 2019-12-20 ENCOUNTER — Telehealth: Payer: Self-pay | Admitting: Family Medicine

## 2019-12-20 NOTE — Telephone Encounter (Signed)
Discussed with pt. Pt verbalized understanding.  °

## 2019-12-20 NOTE — Telephone Encounter (Signed)
So while in the hospital while on strong steroids it is not unusual to use short acting insulin  I would highly recommend for the patient to check her sugar on a daily basis.  As long as it is maintaining under 200 she would not have to utilize insulin.  Be careful with eating habits minimize starches.  Once she finishes steroids her numbers should come down.  But it is possible that she is developing diabetes that may need other interventions.  Follow sugar readings and keep [place] updated on the readings send [place] update within 4 to 5 days thank you

## 2019-12-20 NOTE — Telephone Encounter (Signed)
Front Please note that this patient was in the hospital for Covid pneumonia.  They recommended for her to do a follow-up in 2 weeks. (At the bottom of her discharge summary the standard 1 week follow-up was printed but in the documentation in the first part of the discharge summary it was recommended to follow-up in 2 weeks which is standard for Covid pneumonia)  Please connect with patient and offered to set up a follow-up in 2 weeks please let me know if any problems. (It is possible that the patient will defer on any type of follow-up, the follow-up can be inside because at that point she would not be considered contagious)

## 2019-12-20 NOTE — Telephone Encounter (Signed)
Pt is scheduled for 9/2. Her BS was elevated in hospital and they where giving her insulin. She would like to know if she needs to continue. Her BS yesterday was 142 at one time and 137 at another.

## 2019-12-21 ENCOUNTER — Other Ambulatory Visit: Payer: Self-pay | Admitting: Nurse Practitioner

## 2019-12-21 ENCOUNTER — Telehealth: Payer: Self-pay | Admitting: Family Medicine

## 2019-12-21 ENCOUNTER — Other Ambulatory Visit: Payer: Self-pay

## 2019-12-21 ENCOUNTER — Encounter (HOSPITAL_COMMUNITY): Payer: Self-pay | Admitting: Emergency Medicine

## 2019-12-21 ENCOUNTER — Inpatient Hospital Stay (HOSPITAL_COMMUNITY)
Admission: EM | Admit: 2019-12-21 | Discharge: 2019-12-28 | DRG: 177 | Disposition: A | Discharge status: Home / Self Care | Payer: BC Managed Care – PPO | Attending: Internal Medicine | Admitting: Internal Medicine

## 2019-12-21 ENCOUNTER — Emergency Department (HOSPITAL_COMMUNITY): Payer: BC Managed Care – PPO

## 2019-12-21 DIAGNOSIS — Z885 Allergy status to narcotic agent status: Secondary | ICD-10-CM

## 2019-12-21 DIAGNOSIS — Z8719 Personal history of other diseases of the digestive system: Secondary | ICD-10-CM

## 2019-12-21 DIAGNOSIS — D649 Anemia, unspecified: Secondary | ICD-10-CM | POA: Diagnosis present

## 2019-12-21 DIAGNOSIS — Z8249 Family history of ischemic heart disease and other diseases of the circulatory system: Secondary | ICD-10-CM | POA: Diagnosis not present

## 2019-12-21 DIAGNOSIS — Z90710 Acquired absence of both cervix and uterus: Secondary | ICD-10-CM | POA: Diagnosis not present

## 2019-12-21 DIAGNOSIS — U071 COVID-19: Secondary | ICD-10-CM

## 2019-12-21 DIAGNOSIS — R7303 Prediabetes: Secondary | ICD-10-CM | POA: Diagnosis present

## 2019-12-21 DIAGNOSIS — F419 Anxiety disorder, unspecified: Secondary | ICD-10-CM | POA: Diagnosis present

## 2019-12-21 DIAGNOSIS — R519 Headache, unspecified: Secondary | ICD-10-CM | POA: Diagnosis present

## 2019-12-21 DIAGNOSIS — F329 Major depressive disorder, single episode, unspecified: Secondary | ICD-10-CM | POA: Diagnosis present

## 2019-12-21 DIAGNOSIS — J9601 Acute respiratory failure with hypoxia: Secondary | ICD-10-CM | POA: Diagnosis present

## 2019-12-21 DIAGNOSIS — J1282 Pneumonia due to coronavirus disease 2019: Secondary | ICD-10-CM | POA: Diagnosis present

## 2019-12-21 DIAGNOSIS — R739 Hyperglycemia, unspecified: Secondary | ICD-10-CM | POA: Diagnosis present

## 2019-12-21 DIAGNOSIS — E46 Unspecified protein-calorie malnutrition: Secondary | ICD-10-CM | POA: Diagnosis present

## 2019-12-21 DIAGNOSIS — Z79899 Other long term (current) drug therapy: Secondary | ICD-10-CM

## 2019-12-21 DIAGNOSIS — K5900 Constipation, unspecified: Secondary | ICD-10-CM | POA: Diagnosis present

## 2019-12-21 DIAGNOSIS — M797 Fibromyalgia: Secondary | ICD-10-CM | POA: Diagnosis present

## 2019-12-21 DIAGNOSIS — Z811 Family history of alcohol abuse and dependence: Secondary | ICD-10-CM | POA: Diagnosis not present

## 2019-12-21 DIAGNOSIS — E669 Obesity, unspecified: Secondary | ICD-10-CM | POA: Diagnosis present

## 2019-12-21 DIAGNOSIS — K219 Gastro-esophageal reflux disease without esophagitis: Secondary | ICD-10-CM | POA: Diagnosis present

## 2019-12-21 DIAGNOSIS — R7989 Other specified abnormal findings of blood chemistry: Secondary | ICD-10-CM | POA: Diagnosis not present

## 2019-12-21 DIAGNOSIS — R569 Unspecified convulsions: Secondary | ICD-10-CM | POA: Diagnosis present

## 2019-12-21 DIAGNOSIS — Z9049 Acquired absence of other specified parts of digestive tract: Secondary | ICD-10-CM

## 2019-12-21 DIAGNOSIS — R0602 Shortness of breath: Secondary | ICD-10-CM

## 2019-12-21 DIAGNOSIS — Z6833 Body mass index (BMI) 33.0-33.9, adult: Secondary | ICD-10-CM | POA: Diagnosis not present

## 2019-12-21 DIAGNOSIS — G43909 Migraine, unspecified, not intractable, without status migrainosus: Secondary | ICD-10-CM | POA: Diagnosis present

## 2019-12-21 DIAGNOSIS — M1990 Unspecified osteoarthritis, unspecified site: Secondary | ICD-10-CM | POA: Diagnosis present

## 2019-12-21 DIAGNOSIS — Z818 Family history of other mental and behavioral disorders: Secondary | ICD-10-CM

## 2019-12-21 DIAGNOSIS — F429 Obsessive-compulsive disorder, unspecified: Secondary | ICD-10-CM | POA: Diagnosis present

## 2019-12-21 DIAGNOSIS — R7401 Elevation of levels of liver transaminase levels: Secondary | ICD-10-CM | POA: Diagnosis present

## 2019-12-21 DIAGNOSIS — Z5329 Procedure and treatment not carried out because of patient's decision for other reasons: Secondary | ICD-10-CM | POA: Diagnosis present

## 2019-12-21 DIAGNOSIS — Z283 Underimmunization status: Secondary | ICD-10-CM

## 2019-12-21 DIAGNOSIS — I10 Essential (primary) hypertension: Secondary | ICD-10-CM | POA: Diagnosis present

## 2019-12-21 HISTORY — DX: COVID-19: U07.1

## 2019-12-21 LAB — CBC WITH DIFFERENTIAL/PLATELET
Abs Immature Granulocytes: 0.12 10*3/uL — ABNORMAL HIGH (ref 0.00–0.07)
Basophils Absolute: 0 10*3/uL (ref 0.0–0.1)
Basophils Relative: 0 %
Eosinophils Absolute: 0 10*3/uL (ref 0.0–0.5)
Eosinophils Relative: 0 %
HCT: 37.3 % (ref 36.0–46.0)
Hemoglobin: 12.7 g/dL (ref 12.0–15.0)
Immature Granulocytes: 2 %
Lymphocytes Relative: 4 %
Lymphs Abs: 0.3 10*3/uL — ABNORMAL LOW (ref 0.7–4.0)
MCH: 30.7 pg (ref 26.0–34.0)
MCHC: 34 g/dL (ref 30.0–36.0)
MCV: 90.1 fL (ref 80.0–100.0)
Monocytes Absolute: 0.3 10*3/uL (ref 0.1–1.0)
Monocytes Relative: 4 %
Neutro Abs: 6.5 10*3/uL (ref 1.7–7.7)
Neutrophils Relative %: 90 %
Platelets: 243 10*3/uL (ref 150–400)
RBC: 4.14 MIL/uL (ref 3.87–5.11)
RDW: 13.2 % (ref 11.5–15.5)
WBC: 7.3 10*3/uL (ref 4.0–10.5)
nRBC: 0 % (ref 0.0–0.2)

## 2019-12-21 LAB — C-REACTIVE PROTEIN: CRP: 10.7 mg/dL — ABNORMAL HIGH (ref <1.0)

## 2019-12-21 LAB — COMPREHENSIVE METABOLIC PANEL
ALT: 86 U/L — ABNORMAL HIGH (ref 0–44)
AST: 90 U/L — ABNORMAL HIGH (ref 15–41)
Albumin: 3 g/dL — ABNORMAL LOW (ref 3.5–5.0)
Alkaline Phosphatase: 86 U/L (ref 38–126)
Anion gap: 10 (ref 5–15)
BUN: 12 mg/dL (ref 6–20)
CO2: 22 mmol/L (ref 22–32)
Calcium: 8.1 mg/dL — ABNORMAL LOW (ref 8.9–10.3)
Chloride: 107 mmol/L (ref 98–111)
Creatinine, Ser: 0.62 mg/dL (ref 0.44–1.00)
GFR calc Af Amer: >60 mL/min (ref >60)
GFR calc non Af Amer: >60 mL/min (ref >60)
Glucose, Bld: 136 mg/dL — ABNORMAL HIGH (ref 70–99)
Potassium: 3.5 mmol/L (ref 3.5–5.1)
Sodium: 139 mmol/L (ref 135–145)
Total Bilirubin: 0.7 mg/dL (ref 0.3–1.2)
Total Protein: 6.9 g/dL (ref 6.5–8.1)

## 2019-12-21 LAB — TRIGLYCERIDES: Triglycerides: 86 mg/dL (ref <150)

## 2019-12-21 LAB — FIBRINOGEN: Fibrinogen: 516 mg/dL — ABNORMAL HIGH (ref 210–475)

## 2019-12-21 LAB — D-DIMER, QUANTITATIVE: D-Dimer, Quant: 1.7 ug/mL-FEU — ABNORMAL HIGH (ref 0.00–0.50)

## 2019-12-21 LAB — LACTATE DEHYDROGENASE: LDH: 538 U/L — ABNORMAL HIGH (ref 98–192)

## 2019-12-21 LAB — LACTIC ACID, PLASMA: Lactic Acid, Venous: 1.2 mmol/L (ref 0.5–1.9)

## 2019-12-21 LAB — PROCALCITONIN: Procalcitonin: <0.1 ng/mL

## 2019-12-21 LAB — FERRITIN: Ferritin: 233 ng/mL (ref 11–307)

## 2019-12-21 MED ORDER — AMPHETAMINE-DEXTROAMPHET ER 10 MG PO CP24: 10.0000 mg | ORAL_CAPSULE | Freq: Every day | ORAL | 0 refills | Status: DC

## 2019-12-21 MED ORDER — DEXAMETHASONE SODIUM PHOSPHATE 10 MG/ML IJ SOLN
10.0000 mg | Freq: Once | INTRAMUSCULAR | Status: AC
  Administered 2019-12-21: 10 mg via INTRAVENOUS
  Filled 2019-12-21: qty 1

## 2019-12-21 MED ORDER — SODIUM CHLORIDE 0.9 % IV SOLN
100.0000 mg | INTRAVENOUS | Status: AC
  Administered 2019-12-21 (×2): 100 mg via INTRAVENOUS
  Filled 2019-12-21: qty 20

## 2019-12-21 MED ORDER — ALBUTEROL SULFATE HFA 108 (90 BASE) MCG/ACT IN AERS
2.0000 | INHALATION_SPRAY | Freq: Once | RESPIRATORY_TRACT | Status: AC
  Administered 2019-12-21: 2 via RESPIRATORY_TRACT
  Filled 2019-12-21: qty 6.7

## 2019-12-21 MED ORDER — SODIUM CHLORIDE 0.9 % IV SOLN
100.0000 mg | Freq: Every day | INTRAVENOUS | Status: AC
  Administered 2019-12-22 – 2019-12-25 (×4): 100 mg via INTRAVENOUS
  Filled 2019-12-21 (×5): qty 20

## 2019-12-21 MED ORDER — SODIUM CHLORIDE 0.9 % IV BOLUS
500.0000 mL | Freq: Once | INTRAVENOUS | Status: AC
  Administered 2019-12-21: 500 mL via INTRAVENOUS

## 2019-12-21 NOTE — Progress Notes (Signed)
Pharmacy Antibiotic Note  Theresa [name] is a 49 y.o. female admitted on 12/21/2019 with pneumonia due to COVID-19 virus.  Pharmacy has been consulted for remdesivir dosing.  Plan: Remdesivir 200 mg IV x 1 tonight followed by 100 mg IV once daily for a total of 5 days duration     Temp (24hrs), Avg:98.3 F (36.8 C), Min:98.3 F (36.8 C), Max:98.3 F (36.8 C)  Recent Labs  Lab 12/18/19 1437 12/18/19 1801 12/19/19 0537 12/21/19 1958  WBC 4.3  --  1.7* 7.3  CREATININE 0.83  --  0.68 0.62  LATICACIDVEN  --  1.1  --   --     Estimated Creatinine Clearance: 95.2 mL/min (by C-G formula based on SCr of 0.62 mg/dL).    AST elevated at 90 U/L ALT elevated at 86 U/L  Allergies  Allergen Reactions  . Fentanyl Other (See Comments)    Doesn't like the way it feels       Thank you for allowing pharmacy to be a part of this patient's care.  Lennard Capek P. Legrand Como, PharmD, [place] 12/21/2019 8:44 PM

## 2019-12-21 NOTE — ED Provider Notes (Signed)
[facility] EMERGENCY DEPARTMENT Provider Note   CSN: [id number] Arrival date & time: 12/21/19  1902     History Chief Complaint  Patient presents with  . Shortness of Breath    Theresa [name] is a 49 y.o. female.  Patient has history of Covid and was recently hospitalized.  She returns today very short of breath  The history is provided by the patient and medical records. No language interpreter was used.  Shortness of Breath Severity:  Severe Onset quality:  Gradual Timing:  Constant Progression:  Worsening Chronicity:  New Context: activity   Relieved by:  Nothing Worsened by:  Nothing Ineffective treatments:  None tried Associated symptoms: no abdominal pain, no chest pain, no cough, no headaches and no rash        Past Medical History:  Diagnosis Date  . Anxiety   . Depression   . Fibromyalgia   . GERD (gastroesophageal reflux disease)   . Headache(784.0)   . Heart murmur   . Hyperplastic colon polyp 10/28/2019   Dr Sydell Axon recommends repeat colonoscopy in 2028.  Marland Kitchen IFG (impaired fasting glucose)   . Obsessive-compulsive disorder   . Osteoarthritis   . Seizures ([place])    last seizure at age 50-stressed induced seizure.    Patient Active Problem List   Diagnosis Date Noted  . COVID-19 12/21/2019  . Pneumonia due to COVID-19 virus 12/18/2019  . Attention deficit disorder (ADD) without hyperactivity 11/15/2019  . Hyperplastic colon polyp 10/28/2019  . History of adenomatous polyp of colon 08/28/2019  . Dysphagia 08/28/2019  . Acute diverticulitis 08/13/2019  . Hypokalemia 08/13/2019  . Elevated liver transaminase level   . Vaginal discharge 07/11/2019  . Hair loss 07/11/2019  . Body aches 07/11/2019  . Brain fog 07/11/2019  . Rectocele 07/11/2019  . Sleep disturbance 07/11/2019  . Gastroesophageal reflux disease without esophagitis 05/21/2019  . Prediabetes 11/26/2017  . Migraine without aura and without status migrainosus, not intractable  10/04/2017  . Morbid obesity ([place]) 04/10/2016  . Diverticulosis of large intestine 12/05/2015  . BRCA negative 02/10/2015  . IFG (impaired fasting glucose) 07/05/2014  . Essential hypertension, benign 04/27/2014  . Anxiety 04/27/2014  . Abdominal pain, epigastric 02/07/2014  . Nausea without vomiting 02/07/2014  . Bloating 02/07/2014  . Fatigue 09/18/2012  . Hyperglycemia 09/18/2012  . Dysthymia 07/14/2012  . Insomnia due to mental disorder 07/14/2012  . Lower abdominal pain 04/12/2012  . Headache(784.0) 04/12/2012  . Constipation 04/12/2012    Past Surgical History:  Procedure Laterality Date  . ABDOMINAL HYSTERECTOMY     still has right ovary  . BIOPSY N/A 03/05/2014   Procedure: DUODENAL AND GASTRIC BIOPSY;  Surgeon: Danie Binder, MD;  Location: [facility] ORS;  Service: Endoscopy;  Laterality: N/A;  . bladder sling X 3    . CHOLECYSTECTOMY    . COLONOSCOPY  04/24/2012   BBC:[id number] polyp measuring 5 mm in size was found in the proximal transverse colon; polypectomy was performed with cold forceps Pedunculated polyp measuring 1.1 cm in size was found in the sigmoid colon; polypectomy was performed using snare cautery Moderate diverticulosis was noted in the descending colon and sigmoid colon small internal hemorrhoids. next tcs 04/2017  . COLONOSCOPY WITH PROPOFOL N/A 10/23/2019   Procedure: COLONOSCOPY WITH PROPOFOL;  Surgeon: Daneil Dolin, MD;  Location: [facility] ENDO SUITE;  Service: Endoscopy;  Laterality: N/A;  12:30pm  . ESOPHAGOGASTRODUODENOSCOPY N/A 02/18/2014   Procedure: ESOPHAGOGASTRODUODENOSCOPY (EGD);  Surgeon: Danie Binder, MD;  Location: [facility]  ENDO SUITE;  Service: Endoscopy;  Laterality: N/A;  115  . ESOPHAGOGASTRODUODENOSCOPY (EGD) WITH PROPOFOL N/A 03/05/2014   SLF: 1. dyspepsia due to constipation, gastritis, and GERD 2. Multiple Gastric polyps 3. Moderate non-erosive gastritis 4. Diverticulum in the 2nd part of the duodenum  . ESOPHAGOGASTRODUODENOSCOPY (EGD) WITH  PROPOFOL N/A 10/23/2019   Procedure: ESOPHAGOGASTRODUODENOSCOPY (EGD) WITH PROPOFOL;  Surgeon: Daneil Dolin, MD;  Location: [facility];  Service: Endoscopy;  Laterality: N/A;  Venia Minks DILATION N/A 10/23/2019   Procedure: Venia Minks DILATION;  Surgeon: Daneil Dolin, MD;  Location: [facility];  Service: Endoscopy;  Laterality: N/A;  . POLYPECTOMY  10/23/2019   Procedure: POLYPECTOMY;  Surgeon: Daneil Dolin, MD;  Location: [facility];  Service: Endoscopy;;  . TUBAL LIGATION       OB History    Gravida  2   Para  2   Term      Preterm      AB  0   Living  2     SAB      TAB      Ectopic  0   Multiple      Live Births              Family History  Problem Relation Age of Onset  . Alcohol abuse Mother   . Depression Mother   . Anxiety disorder Mother   . Paranoid behavior Mother   . Hypertension Mother   . Alcohol abuse Father   . Hypertension Father   . Physical abuse Brother   . Colon cancer Neg Hx   . ADD / ADHD Neg Hx   . Bipolar disorder Neg Hx   . Dementia Neg Hx   . OCD Neg Hx   . Drug abuse Neg Hx   . Schizophrenia Neg Hx   . Seizures Neg Hx   . Sexual abuse Neg Hx     Social History   Tobacco Use  . Smoking status: Never Smoker  . Smokeless tobacco: Never Used  . Tobacco comment: as a kid  Vaping Use  . Vaping Use: Never used  Substance Use Topics  . Alcohol use: Yes    Comment: occas  . Drug use: No    Home Medications Prior to Admission medications   Medication Sig Start Date End Date Taking? Authorizing Provider  albuterol (VENTOLIN HFA) 108 (90 Base) MCG/ACT inhaler Inhale 2 puffs into the lungs every 4 (four) hours as needed for wheezing or shortness of breath. 12/19/19   Johnson, Clanford L, MD  amphetamine-dextroamphetamine (ADDERALL XR) 10 MG 24 hr capsule Take 1 capsule (10 mg total) by mouth daily. 12/21/19   Nilda Simmer, NP  ascorbic acid (VITAMIN C) 500 MG tablet Take 1 tablet (500 mg total) by mouth daily.  12/20/19   Johnson, Clanford L, MD  dexamethasone (DECADRON) 6 MG tablet Take 1 tablet (6 mg total) by mouth daily for 8 days. 12/19/19 12/27/19  Johnson, Clanford L, MD  diazepam (VALIUM) 5 MG tablet TAKE 1/2 TO 1 TABLET BY MOUTH ONCE DAILY AS NEEDED FOR ANXIETY Patient taking differently: Take 0.5-1 mg by mouth daily as needed for anxiety.  10/12/19   Nilda Simmer, NP  estradiol (ESTRACE) 1 MG tablet Take 1 tablet (1 mg total) by mouth daily. 07/13/19 07/12/20  Estill Dooms, NP  guaiFENesin-dextromethorphan (ROBITUSSIN DM) 100-10 MG/5ML syrup Take 10 mLs by mouth every 4 (four) hours as needed for cough. 12/19/19  Johnson, Clanford L, MD  Magnesium 400 MG CAPS Take 400 mg by mouth daily.     [provider]  Multiple Vitamin (MULTIVITAMIN) tablet Take 1 tablet by mouth daily.    [provider]  pantoprazole (PROTONIX) 40 MG tablet Take 1 tablet (40 mg total) by mouth daily. 08/28/19   Carlis Stable, NP  promethazine (PHENERGAN) 25 MG tablet Take one half to one whole tablet every 8 hours prn. Caution drowiness 12/17/19   Kathyrn Drown, MD  rizatriptan (MAXALT-MLT) 10 MG disintegrating tablet TAKE 1 TABLET AS NEEDED FOR MIGRAIN, MAY REPEAT IN 2 HOURS MAX 2/24 HOURS Patient taking differently: Take 10 mg by mouth See admin instructions. TAKE 1 TABLET AS NEEDED FOR MIGRAIN, MAY REPEAT IN 2 HOURS MAX 2/24 HOURS 03/19/19   Kathyrn Drown, MD  valACYclovir (VALTREX) 1000 MG tablet Take 2 tablets now and take the other 2 tablets 12 hours later. 12/12/19   Kathyrn Drown, MD  zinc sulfate 220 (50 Zn) MG capsule Take 1 capsule (220 mg total) by mouth daily. 12/20/19   Johnson, Clanford L, MD  fluticasone (FLONASE) 50 MCG/ACT nasal spray Place 2 sprays into both nostrils daily. Patient not taking: Reported on 10/11/2019 09/29/19 10/14/19  Lestine Box, PA-C    Allergies    Fentanyl  Review of Systems   Review of Systems  Constitutional: Negative for appetite change and  fatigue.  HENT: Negative for congestion, ear discharge and sinus pressure.   Eyes: Negative for discharge.  Respiratory: Positive for shortness of breath. Negative for cough.   Cardiovascular: Negative for chest pain.  Gastrointestinal: Negative for abdominal pain and diarrhea.  Genitourinary: Negative for frequency and hematuria.  Musculoskeletal: Negative for back pain.  Skin: Negative for rash.  Neurological: Negative for seizures and headaches.  Psychiatric/Behavioral: Negative for hallucinations.    Physical Exam Updated Vital Signs BP 115/87   Pulse 69   Temp 98.3 F (36.8 C) (Oral)   Resp (!) 42   SpO2 90%   Physical Exam Vitals and nursing note reviewed.  Constitutional:      Appearance: She is well-developed.  HENT:     Head: Normocephalic.     Nose: Nose normal.     Mouth/Throat:     Mouth: Mucous membranes are moist.  Eyes:     General: No scleral icterus.    Conjunctiva/sclera: Conjunctivae normal.  Neck:     Thyroid: No thyromegaly.  Cardiovascular:     Rate and Rhythm: Regular rhythm. Tachycardia present.     Heart sounds: No murmur heard.  No friction rub. No gallop.   Pulmonary:     Effort: Respiratory distress present.     Breath sounds: No stridor. No wheezing or rales.  Chest:     Chest wall: No tenderness.  Abdominal:     General: There is no distension.     Tenderness: There is no abdominal tenderness. There is no rebound.  Musculoskeletal:        General: Normal range of motion.     Cervical back: Neck supple.  Lymphadenopathy:     Cervical: No cervical adenopathy.  Skin:    Findings: No erythema or rash.  Neurological:     Mental Status: She is oriented to person, place, and time.     Motor: No abnormal muscle tone.     Coordination: Coordination normal.  Psychiatric:        Behavior: Behavior normal.     ED Results / Procedures /  Treatments   Labs (all labs ordered are listed, but only abnormal results are displayed) Labs  Reviewed  CBC WITH DIFFERENTIAL/PLATELET - Abnormal; Notable for the following components:      Result Value   Lymphs Abs 0.3 (*)    Abs Immature Granulocytes 0.12 (*)    All other components within normal limits  COMPREHENSIVE METABOLIC PANEL - Abnormal; Notable for the following components:   Glucose, Bld 136 (*)    Calcium 8.1 (*)    Albumin 3.0 (*)    AST 90 (*)    ALT 86 (*)    All other components within normal limits  D-DIMER, QUANTITATIVE (NOT AT [facility]) - Abnormal; Notable for the following components:   D-Dimer, Quant 1.70 (*)    All other components within normal limits  LACTATE DEHYDROGENASE - Abnormal; Notable for the following components:   LDH 538 (*)    All other components within normal limits  FIBRINOGEN - Abnormal; Notable for the following components:   Fibrinogen 516 (*)    All other components within normal limits  CULTURE, BLOOD (ROUTINE X 2)  CULTURE, BLOOD (ROUTINE X 2)  LACTIC ACID, PLASMA  PROCALCITONIN  TRIGLYCERIDES  FERRITIN  C-REACTIVE PROTEIN  POC URINE PREG, ED    EKG None  Radiology DG Chest Port 1 View  Result Date: 12/21/2019 CLINICAL DATA:  COVID-19 positive patient who developed shortness of breath 3 days ago. EXAM: PORTABLE CHEST 1 VIEW COMPARISON:  PA and lateral chest 12/18/2019. FINDINGS: Bilateral airspace disease consistent with pneumonia has markedly worsened. Heart size is normal. No pneumothorax or pleural effusion. No acute or focal bony abnormality. IMPRESSION: Marked worsening bilateral pneumonia. Electronically Signed   By: Inge Rise M.D.   On: 12/21/2019 20:33    Procedures Procedures (including critical care time)  Medications Ordered in ED Medications  remdesivir 100 mg in sodium chloride 0.9 % 100 mL IVPB (has no administration in time range)  dexamethasone (DECADRON) injection 10 mg (10 mg Intravenous Given 12/21/19 2016)  albuterol (VENTOLIN HFA) 108 (90 Base) MCG/ACT inhaler 2 puff (2 puffs Inhalation Given  12/21/19 2054)  sodium chloride 0.9 % bolus 500 mL (500 mLs Intravenous New Bag/Given 12/21/19 2052)  remdesivir 100 mg in sodium chloride 0.9 % 100 mL IVPB (100 mg Intravenous New Bag/Given 12/21/19 2145)   Theresa [name] was evaluated in Emergency Department on 12/24/2019 for the symptoms described in the history of present illness. She was evaluated in the context of the global COVID-19 pandemic, which necessitated consideration that the patient might be at risk for infection with the SARS-CoV-2 virus that causes COVID-19. Institutional protocols and algorithms that pertain to the evaluation of patients at risk for COVID-19 are in a state of rapid change based on information released by regulatory bodies including the CDC and federal and state organizations. These policies and algorithms were followed during the patient's care in the ED.  ED Course  I have reviewed the triage vital signs and the nursing notes.  Pertinent labs & imaging results that were available during my care of the patient were reviewed by me and considered in my medical decision making (see chart for details). CRITICAL CARE Performed by: Milton Ferguson Total critical care time: 40 minutes Critical care time was exclusive of separately billable procedures and treating other patients. Critical care was necessary to treat or prevent imminent or life-threatening deterioration. Critical care was time spent personally by me on the following activities: development of treatment plan with patient and/or  surrogate as well as nursing, discussions with consultants, evaluation of patient's response to treatment, examination of patient, obtaining history from patient or surrogate, ordering and performing treatments and interventions, ordering and review of laboratory studies, ordering and review of radiographic studies, pulse oximetry and re-evaluation of patient's condition.    MDM Rules/Calculators/A&P                          Patient  with severe hypoxia from Covid she will be admitted to medicine       This patient presents to the ED for concern of short of breath this involves an extensive number of treatment options, and is a complaint that carries with it a high risk of complications and morbidity.  The differential diagnosis includes COVID-19   Lab Tests:   I Ordered, reviewed, and interpreted labs, which included CBC chemistries that showed elevated glucose and anemia  Medicines ordered:   I ordered medication remdesivir for Covid infection  Imaging Studies ordered:   I ordered imaging studies which included chest x-ray  I independently visualized and interpreted imaging which showed viral pneumonia  Additional history obtained:   Additional history obtained from record  Previous records obtained and reviewed.  Consultations Obtained:   I consulted hospitalist and discussed lab and imaging findings  Reevaluation:  After the interventions stated above, I reevaluated the patient and found no improvement  Critical Interventions:  .   Final Clinical Impression(s) / ED Diagnoses Final diagnoses:  [id number]    Rx / DC Orders ED Discharge Orders    None       Milton Ferguson, MD 12/24/19 1057

## 2019-12-21 NOTE — Telephone Encounter (Signed)
Attempted to contact patient again after sending my chart message. Pt answered and was informed to go to ER. Pt states her daughter is on her way to pick her up to take her to ER. Pt verbalized understanding.

## 2019-12-21 NOTE — H&P (Signed)
TRH H&P    Patient Demographics:    Theresa [name], is a 49 y.o. female  MRN: [id number]  DOB - [birth year]  Admit Date - 12/21/2019  Referring MD/NP/PA: Roderic Palau  Outpatient Primary MD for the patient is Kathyrn Drown, MD  Patient coming from: Home  Chief complaint- Dyspnea   HPI:    Theresa [name]  is a 49 y.o. female, with history of seizures, osteoarthritis, obsessive-compulsive disorder, heart murmur, GERD, fibromyalgia, depression, anxiety presents to the ED at the recommendation of her PCP.  Patient reports that she had contacted her PCP regarding oxygen sats in the low 70s high 60s at home.  PCP told her that she was at risk of death if she did not return to the ER.  Patient had been in the ER on 12/18/2019, and refused remdesivir at that time.  She reports that she refused it because there is so much misinformation she did not know anything about the medication and therefore did not want to take it.  She has agreed remdesivir this time.  Patient was diagnosed with Covid on 10 August.  She reports that she has been experiencing shortness of breath, worse with exertion, better with rest.  With exertion she feels presyncopal.  She reports a dry cough.  She had fevers and body aches initially, but they have resolved.  She also had nausea and vomiting last time she was in the ER.  She reports poor p.o. intake.  She has palpitations as well that are worse with exertion, better with rest.  Patient reports that she has not been vaccinated for Covid.  She has been taking Decadron, and inhalers in the outpatient setting.  She reports that the inhaler helps initially, but her symptoms resume after a short interval.  In the ED patient is requiring 35 L high flow nasal cannula to maintain his saturation at 91%.  In the ED Temp 98.3, respiratory rate 39, pulse 62, blood pressure 132/81 satting at 91% on 35 L No leukocytosis  with a white blood cell count of 7.3 Basic CHEM is mostly unremarkable with a hyperglycemia of 136 AST 90, ALT 86, albumin 3.0 Covid markers show D-dimer 1.70, fibrinogen 516, lactic acid 1.2, pro-Cal less than 0.10, ferritin 209 Chest x-ray shows worsening bilateral pneumonia Patient started on remdesivir, albuterol, Decadron   Review of systems:    In addition to the HPI above,  Review of Systems  Constitutional: Positive for diaphoresis and malaise/fatigue. Negative for chills and fever.  HENT: Negative for congestion, ear pain, sinus pain, sore throat and tinnitus.   Eyes: Negative for blurred vision, double vision and photophobia.  Respiratory: Positive for cough and shortness of breath. Negative for hemoptysis and sputum production.   Cardiovascular: Positive for palpitations. Negative for chest pain and leg swelling.  Gastrointestinal: Positive for constipation, nausea and vomiting. Negative for abdominal pain and diarrhea.  Genitourinary: Negative for dysuria, frequency and urgency.  Musculoskeletal: Positive for myalgias.  Skin: Negative for itching and rash.  Neurological: Positive for dizziness and weakness. Negative for loss  of consciousness.  Psychiatric/Behavioral: Negative for substance abuse.     All other systems reviewed and are negative.    Past History of the following :    Past Medical History:  Diagnosis Date  . Anxiety   . Depression   . Fibromyalgia   . GERD (gastroesophageal reflux disease)   . Headache(784.0)   . Heart murmur   . Hyperplastic colon polyp 10/28/2019   Dr Sydell Axon recommends repeat colonoscopy in 2028.  Marland Kitchen IFG (impaired fasting glucose)   . Obsessive-compulsive disorder   . Osteoarthritis   . Seizures ([place])    last seizure at age 25-stressed induced seizure.      Past Surgical History:  Procedure Laterality Date  . ABDOMINAL HYSTERECTOMY     still has right ovary  . BIOPSY N/A 03/05/2014   Procedure: DUODENAL AND GASTRIC BIOPSY;   Surgeon: Danie Binder, MD;  Location: [facility];  Service: Endoscopy;  Laterality: N/A;  . bladder sling X 3    . CHOLECYSTECTOMY    . COLONOSCOPY  04/24/2012   [facility]:ERDEYCX polyp measuring 5 mm in size was found in the proximal transverse colon; polypectomy was performed with cold forceps Pedunculated polyp measuring 1.1 cm in size was found in the sigmoid colon; polypectomy was performed using snare cautery Moderate diverticulosis was noted in the descending colon and sigmoid colon small internal hemorrhoids. next tcs 04/2017  . COLONOSCOPY WITH PROPOFOL N/A 10/23/2019   Procedure: COLONOSCOPY WITH PROPOFOL;  Surgeon: Daneil Dolin, MD;  Location: [facility];  Service: Endoscopy;  Laterality: N/A;  12:30pm  . ESOPHAGOGASTRODUODENOSCOPY N/A 02/18/2014   Procedure: ESOPHAGOGASTRODUODENOSCOPY (EGD);  Surgeon: Danie Binder, MD;  Location: [facility];  Service: Endoscopy;  Laterality: N/A;  115  . ESOPHAGOGASTRODUODENOSCOPY (EGD) WITH PROPOFOL N/A 03/05/2014   SLF: 1. dyspepsia due to constipation, gastritis, and GERD 2. Multiple Gastric polyps 3. Moderate non-erosive gastritis 4. Diverticulum in the 2nd part of the duodenum  . ESOPHAGOGASTRODUODENOSCOPY (EGD) WITH PROPOFOL N/A 10/23/2019   Procedure: ESOPHAGOGASTRODUODENOSCOPY (EGD) WITH PROPOFOL;  Surgeon: Daneil Dolin, MD;  [facility];  Service: Endoscopy;  Laterality: N/A;  Venia Minks DILATION N/A 10/23/2019   Procedure: Venia Minks DILATION;  Surgeon: Daneil Dolin, MD;  Location: [facility];  Service: Endoscopy;  Laterality: N/A;  . POLYPECTOMY  10/23/2019   Procedure: POLYPECTOMY;  Surgeon: Daneil Dolin, MD;  Location: [facility];  Service: Endoscopy;;  . TUBAL LIGATION        Social History:      Social History   Tobacco Use  . Smoking status: Never Smoker  . Smokeless tobacco: Never Used  . Tobacco comment: as a kid  Substance Use Topics  . Alcohol use: Yes    Comment: occas       Family History :      Family History  Problem Relation Age of Onset  . Alcohol abuse Mother   . Depression Mother   . Anxiety disorder Mother   . Paranoid behavior Mother   . Hypertension Mother   . Alcohol abuse Father   . Hypertension Father   . Physical abuse Brother   . Colon cancer Neg Hx   . ADD / ADHD Neg Hx   . Bipolar disorder Neg Hx   . Dementia Neg Hx   . OCD Neg Hx   . Drug abuse Neg Hx   . Schizophrenia Neg Hx   . Seizures Neg Hx   . Sexual abuse  Neg Hx    Reviewed    Home Medications:   Prior to Admission medications   Medication Sig Start Date End Date Taking? Authorizing Provider  albuterol (VENTOLIN HFA) 108 (90 Base) MCG/ACT inhaler Inhale 2 puffs into the lungs every 4 (four) hours as needed for wheezing or shortness of breath. 12/19/19   Johnson, Clanford L, MD  amphetamine-dextroamphetamine (ADDERALL XR) 10 MG 24 hr capsule Take 1 capsule (10 mg total) by mouth daily. 12/21/19   Nilda Simmer, NP  ascorbic acid (VITAMIN C) 500 MG tablet Take 1 tablet (500 mg total) by mouth daily. 12/20/19   Johnson, Clanford L, MD  dexamethasone (DECADRON) 6 MG tablet Take 1 tablet (6 mg total) by mouth daily for 8 days. 12/19/19 12/27/19  Johnson, Clanford L, MD  diazepam (VALIUM) 5 MG tablet TAKE 1/2 TO 1 TABLET BY MOUTH ONCE DAILY AS NEEDED FOR ANXIETY Patient taking differently: Take 0.5-1 mg by mouth daily as needed for anxiety.  10/12/19   Nilda Simmer, NP  estradiol (ESTRACE) 1 MG tablet Take 1 tablet (1 mg total) by mouth daily. 07/13/19 07/12/20  Estill Dooms, NP  guaiFENesin-dextromethorphan (ROBITUSSIN DM) 100-10 MG/5ML syrup Take 10 mLs by mouth every 4 (four) hours as needed for cough. 12/19/19   Johnson, Clanford L, MD  Magnesium 400 MG CAPS Take 400 mg by mouth daily.     [provider]  Multiple Vitamin (MULTIVITAMIN) tablet Take 1 tablet by mouth daily.    [provider]  pantoprazole (PROTONIX) 40 MG tablet Take 1 tablet (40 mg total) by mouth  daily. 08/28/19   Carlis Stable, NP  promethazine (PHENERGAN) 25 MG tablet Take one half to one whole tablet every 8 hours prn. Caution drowiness 12/17/19   Kathyrn Drown, MD  rizatriptan (MAXALT-MLT) 10 MG disintegrating tablet TAKE 1 TABLET AS NEEDED FOR MIGRAIN, MAY REPEAT IN 2 HOURS MAX 2/24 HOURS Patient taking differently: Take 10 mg by mouth See admin instructions. TAKE 1 TABLET AS NEEDED FOR MIGRAIN, MAY REPEAT IN 2 HOURS MAX 2/24 HOURS 03/19/19   Kathyrn Drown, MD  valACYclovir (VALTREX) 1000 MG tablet Take 2 tablets now and take the other 2 tablets 12 hours later. 12/12/19   Kathyrn Drown, MD  zinc sulfate 220 (50 Zn) MG capsule Take 1 capsule (220 mg total) by mouth daily. 12/20/19   Johnson, Clanford L, MD  fluticasone (FLONASE) 50 MCG/ACT nasal spray Place 2 sprays into both nostrils daily. Patient not taking: Reported on 10/11/2019 09/29/19 10/14/19  Lestine Box, PA-C     Allergies:     Allergies  Allergen Reactions  . Fentanyl Other (See Comments)    Doesn't like the way it feels      Physical Exam:   Vitals  Blood pressure 115/87, pulse 69, temperature 98.3 F (36.8 C), temperature source Oral, resp. rate (!) 42, SpO2 90 %.  1.  General: Lying supine in bed on high flow nasal cannula  2. Psychiatric: Mood and affect are normal for situation  3. Neurologic: Cranial nerves II through XII are grossly intact, moves all 4 extremities voluntarily, no focal deficits on limited exam  4. HEENMT:  Head is atraumatic, normocephalic, pupils are reactive to light, neck is supple, trachea is midline, mucous membranes are dry  5. Respiratory : Diminished lung sounds bilaterally, no wheeze rhonchi or rales  6. Cardiovascular : Heart rate is normal, rhythm is regular, history of murmur but no murmur auscultated at this  time  7. Gastrointestinal:  Abdomen is soft, nondistended, nontender to palpation  8. Skin:  No acute lesions on limited skin  exam  9.Musculoskeletal:  No acute deformity or peripheral edema Peripheral pulses palpated    Data Review:    CBC Recent Labs  Lab 12/18/19 1437 12/19/19 0537 12/21/19 1958  WBC 4.3 1.7* 7.3  HGB 13.5 11.7* 12.7  HCT 39.8 34.7* 37.3  PLT 134* 129* 243  MCV 89.0 89.7 90.1  MCH 30.2 30.2 30.7  MCHC 33.9 33.7 34.0  RDW 12.6 12.8 13.2  LYMPHSABS  --  0.1* 0.3*  MONOABS  --  0.1 0.3  EOSABS  --  0.0 0.0  BASOSABS  --  0.0 0.0   ------------------------------------------------------------------------------------------------------------------  Results for orders placed or performed during the hospital encounter of 12/21/19 (from the past 48 hour(s))  Triglycerides     Status: None   Collection Time: 12/21/19  7:30 PM  Result Value Ref Range   Triglycerides 86 <150 mg/dL    Comment: Performed at [facility], [address] [place], [place] [address]  CBC WITH DIFFERENTIAL     Status: Abnormal   Collection Time: 12/21/19  7:58 PM  Result Value Ref Range   WBC 7.3 4.0 - 10.5 K/uL   RBC 4.14 3.87 - 5.11 MIL/uL   Hemoglobin 12.7 12.0 - 15.0 g/dL   HCT 37.3 36 - 46 %   MCV 90.1 80.0 - 100.0 fL   MCH 30.7 26.0 - 34.0 pg   MCHC 34.0 30.0 - 36.0 g/dL   RDW 13.2 11.5 - 15.5 %   Platelets 243 150 - 400 K/uL   nRBC 0.0 0.0 - 0.2 %   Neutrophils Relative % 90 %   Neutro Abs 6.5 1.7 - 7.7 K/uL   Lymphocytes Relative 4 %   Lymphs Abs 0.3 (L) 0.7 - 4.0 K/uL   Monocytes Relative 4 %   Monocytes Absolute 0.3 0 - 1 K/uL   Eosinophils Relative 0 %   Eosinophils Absolute 0.0 0 - 0 K/uL   Basophils Relative 0 %   Basophils Absolute 0.0 0 - 0 K/uL   Immature Granulocytes 2 %   Abs Immature Granulocytes 0.12 (H) 0.00 - 0.07 K/uL    Comment: Performed at [facility], [address] [place], [place] [address]  Comprehensive metabolic panel     Status: Abnormal   Collection Time: 12/21/19  7:58 PM  Result Value Ref Range   Sodium 139 135 - 145 mmol/L   Potassium 3.5 3.5 - 5.1  mmol/L   Chloride 107 98 - 111 mmol/L   CO2 22 22 - 32 mmol/L   Glucose, Bld 136 (H) 70 - 99 mg/dL    Comment: Glucose reference range applies only to samples taken after fasting for at least 8 hours.   BUN 12 6 - 20 mg/dL   Creatinine, Ser 0.62 0.44 - 1.00 mg/dL   Calcium 8.1 (L) 8.9 - 10.3 mg/dL   Total Protein 6.9 6.5 - 8.1 g/dL   Albumin 3.0 (L) 3.5 - 5.0 g/dL   AST 90 (H) 15 - 41 U/L   ALT 86 (H) 0 - 44 U/L   Alkaline Phosphatase 86 38 - 126 U/L   Total Bilirubin 0.7 0.3 - 1.2 mg/dL   GFR calc non Af Amer >60 >60 mL/min   GFR calc Af Amer >60 >60 mL/min   Anion gap 10 5 - 15    Comment: Performed at [facility], [address] [place], [place] [address]  D-dimer, quantitative     Status: Abnormal   Collection Time: 12/21/19  7:58 PM  Result Value Ref Range   D-Dimer, Quant 1.70 (H) 0.00 - 0.50 ug/mL-FEU    Comment: (NOTE) At the manufacturer cut-off of 0.50 ug/mL FEU, this assay has been documented to exclude PE with a sensitivity and negative predictive value of 97 to 99%.  At this time, this assay has not been approved by the FDA to exclude DVT/VTE. Results should be correlated with clinical presentation. Performed at [facility], [address], [place], [place] [address]   Procalcitonin     Status: None   Collection Time: 12/21/19  7:58 PM  Result Value Ref Range   Procalcitonin <0.10 ng/mL    Comment:        Interpretation: PCT (Procalcitonin) <= 0.5 ng/mL: Systemic infection (sepsis) is not likely. Local bacterial infection is possible. (NOTE)       Sepsis PCT Algorithm           Lower Respiratory Tract                                      Infection PCT Algorithm    ----------------------------     ----------------------------         PCT < 0.25 ng/mL                PCT < 0.10 ng/mL          Strongly encourage             Strongly discourage   discontinuation of antibiotics    initiation of antibiotics    ----------------------------      -----------------------------       PCT 0.25 - 0.50 ng/mL            PCT 0.10 - 0.25 ng/mL               OR       >80% decrease in PCT            Discourage initiation of                                            antibiotics      Encourage discontinuation           of antibiotics    ----------------------------     -----------------------------         PCT >= 0.50 ng/mL              PCT 0.26 - 0.50 ng/mL               AND        <80% decrease in PCT             Encourage initiation of                                             antibiotics       Encourage continuation           of antibiotics    ----------------------------     -----------------------------        PCT >= 0.50  ng/mL                  PCT > 0.50 ng/mL               AND         increase in PCT                  Strongly encourage                                      initiation of antibiotics    Strongly encourage escalation           of antibiotics                                     -----------------------------                                           PCT <= 0.25 ng/mL                                                 OR                                        > 80% decrease in PCT                                      Discontinue / Do not initiate                                             antibiotics  Performed at [facility], [address], [place], [place] [address]   Lactate dehydrogenase     Status: Abnormal   Collection Time: 12/21/19  7:58 PM  Result Value Ref Range   LDH 538 (H) 98 - 192 U/L    Comment: Performed at [facility], [address], [place], [place] [address]  Fibrinogen     Status: Abnormal   Collection Time: 12/21/19  7:58 PM  Result Value Ref Range   Fibrinogen 516 (H) 210 - 475 mg/dL    Comment: Performed at [facility], [address], [place], [place] [address]  Lactic acid, plasma     Status: None   Collection Time: 12/21/19  8:10 PM  Result Value Ref Range   Lactic Acid, Venous  1.2 0.5 - 1.9 mmol/L    Comment: Performed at [facility], [address], [place],  [address]  Blood Culture (routine x 2)     Status: None (Preliminary result)   Collection Time: 12/21/19  8:10 PM   Specimen: Right Antecubital; Blood  Result Value Ref Range   Specimen Description RIGHT ANTECUBITAL    Special Requests      BOTTLES DRAWN AEROBIC AND ANAEROBIC Blood Culture adequate volume Performed  at [facility], [address] [place], [place] [address]    Culture PENDING    Report Status PENDING   Blood Culture (routine x 2)     Status: None (Preliminary result)   Collection Time: 12/21/19  8:15 PM   Specimen: Left Antecubital; Blood  Result Value Ref Range   Specimen Description LEFT ANTECUBITAL    Special Requests      BOTTLES DRAWN AEROBIC AND ANAEROBIC Blood Culture adequate volume Performed at [facility], [address], [place], [place] [address]    Culture PENDING    Report Status PENDING     Chemistries  Recent Labs  Lab 12/18/19 1437 12/19/19 0537 12/21/19 1958  NA 133* 136 139  K 3.7 3.5 3.5  CL 98 108 107  CO2 22 19* 22  GLUCOSE 147* 222* 136*  BUN 11 11 12   CREATININE 0.83 0.68 0.62  CALCIUM 8.1* 7.5* 8.1*  MG  --  1.8  --   AST 63* 50* 90*  ALT 45* 41 86*  ALKPHOS 81 72 86  BILITOT 0.9 0.6 0.7   ------------------------------------------------------------------------------------------------------------------  ------------------------------------------------------------------------------------------------------------------ GFR: Estimated Creatinine Clearance: 95.2 mL/min (by C-G formula based on SCr of 0.62 mg/dL). Liver Function Tests: Recent Labs  Lab 12/18/19 1437 12/19/19 0537 12/21/19 1958  AST 63* 50* 90*  ALT 45* 41 86*  ALKPHOS 81 72 86  BILITOT 0.9 0.6 0.7  PROT 7.2 6.0* 6.9  ALBUMIN 3.6 2.9* 3.0*   Recent Labs  Lab 12/18/19 1437  LIPASE 19   No results for input(s): AMMONIA in the last 168 hours. Coagulation  Profile: No results for input(s): INR, PROTIME in the last 168 hours. Cardiac Enzymes: No results for input(s): CKTOTAL, CKMB, CKMBINDEX, TROPONINI in the last 168 hours. BNP (last 3 results) No results for input(s): PROBNP in the last 8760 hours. HbA1C: Recent Labs    12/19/19 0537  HGBA1C 5.8*   CBG: Recent Labs  Lab 12/19/19 0814 12/19/19 1156  GLUCAP 170* 227*   Lipid Profile: Recent Labs    12/21/19 1930  TRIG 86   Thyroid Function Tests: No results for input(s): TSH, T4TOTAL, FREET4, T3FREE, THYROIDAB in the last 72 hours. Anemia Panel: Recent Labs    12/19/19 0537  FERRITIN 209    --------------------------------------------------------------------------------------------------------------- Urine analysis:    Component Value Date/Time   COLORURINE YELLOW 12/18/2019 Donald 12/18/2019 1327   LABSPEC 1.016 12/18/2019 1327   PHURINE 6.0 12/18/2019 1327   GLUCOSEU NEGATIVE 12/18/2019 1327   HGBUR NEGATIVE 12/18/2019 1327   BILIRUBINUR NEGATIVE 12/18/2019 1327   KETONESUR 5 (A) 12/18/2019 1327   PROTEINUR 30 (A) 12/18/2019 1327   NITRITE NEGATIVE 12/18/2019 1327   LEUKOCYTESUR NEGATIVE 12/18/2019 1327      Imaging Results:    DG Chest Port 1 View  Result Date: 12/21/2019 CLINICAL DATA:  COVID-19 positive patient who developed shortness of breath 3 days ago. EXAM: PORTABLE CHEST 1 VIEW COMPARISON:  PA and lateral chest 12/18/2019. FINDINGS: Bilateral airspace disease consistent with pneumonia has markedly worsened. Heart size is normal. No pneumothorax or pleural effusion. No acute or focal bony abnormality. IMPRESSION: Marked worsening bilateral pneumonia. Electronically Signed   By: Inge Rise M.D.   On: 12/21/2019 20:33    My personal review of EKG: Rhythm NSR, Rate 72 /min, QTc 466 ,no Acute ST changes   Assessment & Plan:    Active Problems:   * No active hospital problems. *   1. Acute hypoxic respiratory  failure 1. Secondary to Covid pneumonia 2. Covid + 12/11/2019 3. Refused remdesivir at the last ER visit, hypoxic at home, PCP advised patient to return to the ER 4. Remdesivir, albuterol, Decadron started in the ER 5. Requiring 30 L high flow nasal cannula 6. Hypoxia was done to the low 70s 7. Continue oxygen therapy 8. Continue treatment for Covid 9. Wean off O2 as tolerated 2. Covid pneumonia 1. Remdesivir albuterol and Decadron in the ED 2. Continue these therapies 3. See plan above 3. Mild transaminitis 1. Consistent with covid infection 2. Treat as above 3. Trend with CMP in the AM 4. Protein Cal malnutrition 1. 2/2 poor PO intake 2. Encourage nutrient dense PO intake 5.    DVT Prophylaxis-   Heparin - SCDs   AM Labs Ordered, also please review Full Orders  Family Communication: No family at bedside Code Status:  Full  Admission status: Inpatient :The appropriate admission status for this patient is INPATIENT. Inpatient status is judged to be reasonable and necessary in order to provide the required intensity of service to ensure the patient's safety. The patient's presenting symptoms, physical exam findings, and initial radiographic and laboratory data in the context of their chronic comorbidities is felt to place them at high risk for further clinical deterioration. Furthermore, it is not anticipated that the patient will be medically stable for discharge from the hospital within 2 midnights of admission. The following factors support the admission status of inpatient.     The patient's presenting symptoms include shortness of breath and hypoxia The worrisome physical exam findings include Hypoxia The initial radiographic and laboratory data are worrisome because of CXR = worsening BL pneumonia The chronic co-morbidities include psychiatric disorder, heart murmur, GERD, seizures       * I certify that at the point of admission it is my clinical judgment that the  patient will require inpatient hospital care spanning beyond 2 midnights from the point of admission due to high intensity of service, high risk for further deterioration and high frequency of surveillance required.*  Time spent in minutes : [place]

## 2019-12-21 NOTE — ED Triage Notes (Signed)
Pt reports SOB that began 3 days ago. Pt had positive COVID test 12/11/2019. Pt 62% on 6L [place]. Pt placed on NRB at 15L.

## 2019-12-21 NOTE — Telephone Encounter (Signed)
If her oxygen saturation is at low she needs to go back to the ER when she was in the hospital she refuses remdesivir which is the treatment for Covid pneumonia.  If her oxygen is at low there is danger that she could die from this She needs to go back to the ER

## 2019-12-21 NOTE — Telephone Encounter (Signed)
Attempted to contact patient. Pt phone went straight to voicemail. Voicemail is full.

## 2019-12-21 NOTE — Telephone Encounter (Signed)
Pt contacted office. Pt states she is unable to walk to kitchen, had to lay down in floor after shower because she has no energy. Pt oxygen reading while on the phone 68%. Pt states she can not take deep breaths; only takes short shallow breaths. Pt is on dexamethasone 6 mg once daily for 8 days. Pt states she wants to come off dexamethasone but was told she needed to talk to PCP. Spoke with Hoyle Sauer and Hoyle Sauer states that pt needs to be seen and that dexamethasone can be stopped. Pt informed and states that she sat in the ER for 6 hours with COVID and that she is not going to get any better sitting there. Pt advised to go to Urgent Care but pt states she called them first.

## 2019-12-22 DIAGNOSIS — J1282 Pneumonia due to coronavirus disease 2019: Secondary | ICD-10-CM

## 2019-12-22 DIAGNOSIS — R7303 Prediabetes: Secondary | ICD-10-CM

## 2019-12-22 DIAGNOSIS — K219 Gastro-esophageal reflux disease without esophagitis: Secondary | ICD-10-CM

## 2019-12-22 DIAGNOSIS — J9601 Acute respiratory failure with hypoxia: Secondary | ICD-10-CM | POA: Diagnosis present

## 2019-12-22 LAB — CBC WITH DIFFERENTIAL/PLATELET
Abs Immature Granulocytes: 0.09 10*3/uL — ABNORMAL HIGH (ref 0.00–0.07)
Basophils Absolute: 0 10*3/uL (ref 0.0–0.1)
Basophils Relative: 0 %
Eosinophils Absolute: 0 10*3/uL (ref 0.0–0.5)
Eosinophils Relative: 0 %
HCT: 34.5 % — ABNORMAL LOW (ref 36.0–46.0)
Hemoglobin: 11.6 g/dL — ABNORMAL LOW (ref 12.0–15.0)
Lymphocytes Relative: 7 %
Lymphs Abs: 0.4 10*3/uL — ABNORMAL LOW (ref 0.7–4.0)
MCH: 30.5 pg (ref 26.0–34.0)
MCHC: 33.6 g/dL (ref 30.0–36.0)
MCV: 90.8 fL (ref 80.0–100.0)
Monocytes Absolute: 0.1 10*3/uL (ref 0.1–1.0)
Monocytes Relative: 2 %
Neutro Abs: 5.5 10*3/uL (ref 1.7–7.7)
Neutrophils Relative %: 91 %
Platelets: 218 10*3/uL (ref 150–400)
RBC: 3.8 MIL/uL — ABNORMAL LOW (ref 3.87–5.11)
RDW: 13.2 % (ref 11.5–15.5)
WBC: 6 10*3/uL (ref 4.0–10.5)
nRBC: 0 % (ref 0.0–0.2)

## 2019-12-22 LAB — COMPREHENSIVE METABOLIC PANEL
ALT: 76 U/L — ABNORMAL HIGH (ref 0–44)
AST: 61 U/L — ABNORMAL HIGH (ref 15–41)
Albumin: 2.6 g/dL — ABNORMAL LOW (ref 3.5–5.0)
Alkaline Phosphatase: 74 U/L (ref 38–126)
Anion gap: 8 (ref 5–15)
BUN: 15 mg/dL (ref 6–20)
CO2: 21 mmol/L — ABNORMAL LOW (ref 22–32)
Calcium: 7.7 mg/dL — ABNORMAL LOW (ref 8.9–10.3)
Chloride: 111 mmol/L (ref 98–111)
Creatinine, Ser: 0.64 mg/dL (ref 0.44–1.00)
GFR calc Af Amer: >60 mL/min (ref >60)
GFR calc non Af Amer: >60 mL/min (ref >60)
Glucose, Bld: 152 mg/dL — ABNORMAL HIGH (ref 70–99)
Potassium: 3.7 mmol/L (ref 3.5–5.1)
Sodium: 140 mmol/L (ref 135–145)
Total Bilirubin: 0.5 mg/dL (ref 0.3–1.2)
Total Protein: 5.9 g/dL — ABNORMAL LOW (ref 6.5–8.1)

## 2019-12-22 LAB — D-DIMER, QUANTITATIVE: D-Dimer, Quant: 6.15 ug/mL-FEU — ABNORMAL HIGH (ref 0.00–0.50)

## 2019-12-22 LAB — FERRITIN: Ferritin: 174 ng/mL (ref 11–307)

## 2019-12-22 LAB — TROPONIN I (HIGH SENSITIVITY)
Troponin I (High Sensitivity): 5 ng/L (ref <18)
Troponin I (High Sensitivity): 5 ng/L (ref <18)

## 2019-12-22 LAB — MAGNESIUM: Magnesium: 2.3 mg/dL (ref 1.7–2.4)

## 2019-12-22 LAB — PHOSPHORUS: Phosphorus: 3.7 mg/dL (ref 2.5–4.6)

## 2019-12-22 MED ORDER — DIAZEPAM 2 MG PO TABS
2.0000 mg | ORAL_TABLET | Freq: Two times a day (BID) | ORAL | Status: DC | PRN
  Administered 2019-12-22 – 2019-12-28 (×6): 2 mg via ORAL
  Filled 2019-12-22 (×7): qty 1

## 2019-12-22 MED ORDER — METHYLPREDNISOLONE SODIUM SUCC 125 MG IJ SOLR
60.0000 mg | Freq: Two times a day (BID) | INTRAMUSCULAR | Status: DC
  Administered 2019-12-22 – 2019-12-28 (×13): 60 mg via INTRAVENOUS
  Filled 2019-12-22 (×13): qty 2

## 2019-12-22 MED ORDER — SODIUM CHLORIDE 0.9 % IV SOLN: INTRAVENOUS | Status: AC

## 2019-12-22 MED ORDER — IPRATROPIUM-ALBUTEROL 20-100 MCG/ACT IN AERS
1.0000 | INHALATION_SPRAY | Freq: Four times a day (QID) | RESPIRATORY_TRACT | Status: DC | PRN
  Filled 2019-12-22: qty 4

## 2019-12-22 MED ORDER — ALBUTEROL SULFATE HFA 108 (90 BASE) MCG/ACT IN AERS
2.0000 | INHALATION_SPRAY | RESPIRATORY_TRACT | Status: DC | PRN
  Administered 2019-12-22: 2 via RESPIRATORY_TRACT
  Filled 2019-12-22: qty 6.7

## 2019-12-22 MED ORDER — POLYETHYLENE GLYCOL 3350 17 G PO PACK
17.0000 g | PACK | Freq: Every day | ORAL | Status: DC | PRN
  Administered 2019-12-27: 17 g via ORAL
  Filled 2019-12-22: qty 1

## 2019-12-22 MED ORDER — PANTOPRAZOLE SODIUM 40 MG PO TBEC
40.0000 mg | DELAYED_RELEASE_TABLET | Freq: Every day | ORAL | Status: DC
  Administered 2019-12-22 – 2019-12-28 (×7): 40 mg via ORAL
  Filled 2019-12-22 (×7): qty 1

## 2019-12-22 MED ORDER — ZINC SULFATE 220 (50 ZN) MG PO CAPS
220.0000 mg | ORAL_CAPSULE | Freq: Every day | ORAL | Status: DC
  Administered 2019-12-22 – 2019-12-28 (×7): 220 mg via ORAL
  Filled 2019-12-22 (×7): qty 1

## 2019-12-22 MED ORDER — ONDANSETRON HCL 4 MG/2ML IJ SOLN
4.0000 mg | Freq: Four times a day (QID) | INTRAMUSCULAR | Status: DC | PRN
  Administered 2019-12-22 – 2019-12-24 (×3): 4 mg via INTRAVENOUS
  Filled 2019-12-22 (×4): qty 2

## 2019-12-22 MED ORDER — DEXAMETHASONE SODIUM PHOSPHATE 10 MG/ML IJ SOLN: 6.0000 mg | INTRAMUSCULAR | Status: DC

## 2019-12-22 MED ORDER — ASCORBIC ACID 500 MG PO TABS
500.0000 mg | ORAL_TABLET | Freq: Every day | ORAL | Status: DC
  Administered 2019-12-22 – 2019-12-28 (×7): 500 mg via ORAL
  Filled 2019-12-22 (×7): qty 1

## 2019-12-22 MED ORDER — OXYCODONE HCL 5 MG PO TABS
5.0000 mg | ORAL_TABLET | ORAL | Status: DC | PRN
  Administered 2019-12-22 – 2019-12-23 (×2): 5 mg via ORAL
  Filled 2019-12-22 (×2): qty 1

## 2019-12-22 MED ORDER — DIPHENHYDRAMINE HCL 25 MG PO CAPS
50.0000 mg | ORAL_CAPSULE | Freq: Once | ORAL | Status: AC
  Administered 2019-12-22: 50 mg via ORAL
  Filled 2019-12-22: qty 2

## 2019-12-22 MED ORDER — GUAIFENESIN-DM 100-10 MG/5ML PO SYRP
10.0000 mL | ORAL_SOLUTION | ORAL | Status: DC | PRN
  Administered 2019-12-27: 10 mL via ORAL
  Filled 2019-12-22: qty 10

## 2019-12-22 MED ORDER — TOCILIZUMAB 400 MG/20ML IV SOLN
8.0000 mg/kg | Freq: Once | INTRAVENOUS | Status: DC
  Filled 2019-12-22: qty 35.9

## 2019-12-22 MED ORDER — ONDANSETRON HCL 4 MG PO TABS: 4.0000 mg | ORAL_TABLET | Freq: Four times a day (QID) | ORAL | Status: DC | PRN

## 2019-12-22 MED ORDER — ACETAMINOPHEN 325 MG PO TABS
650.0000 mg | ORAL_TABLET | Freq: Four times a day (QID) | ORAL | Status: DC | PRN
  Administered 2019-12-23 – 2019-12-26 (×4): 650 mg via ORAL
  Filled 2019-12-22 (×6): qty 2

## 2019-12-22 MED ORDER — ALBUTEROL SULFATE HFA 108 (90 BASE) MCG/ACT IN AERS
2.0000 | INHALATION_SPRAY | RESPIRATORY_TRACT | Status: DC | PRN
  Filled 2019-12-22: qty 6.7

## 2019-12-22 MED ORDER — ENOXAPARIN SODIUM 100 MG/ML ~~LOC~~ SOLN
1.0000 mg/kg | Freq: Two times a day (BID) | SUBCUTANEOUS | Status: DC
  Administered 2019-12-22 – 2019-12-27 (×11): 90 mg via SUBCUTANEOUS
  Filled 2019-12-22 (×11): qty 1

## 2019-12-22 MED ORDER — HEPARIN SODIUM (PORCINE) 5000 UNIT/ML IJ SOLN
5000.0000 [IU] | Freq: Three times a day (TID) | INTRAMUSCULAR | Status: DC
  Administered 2019-12-22: 5000 [IU] via SUBCUTANEOUS
  Filled 2019-12-22: qty 1

## 2019-12-22 NOTE — ED Notes (Signed)
Bagged lunch given

## 2019-12-22 NOTE — ED Notes (Signed)
Patient vomited small amount of emesis. Patient's stat now 91%

## 2019-12-22 NOTE — Progress Notes (Signed)
PROGRESS NOTE   Theresa [name]  AST:[id number] DOB: 1970/11/15 DOA: 12/21/2019 PCP: Kathyrn Drown, MD   Chief Complaint  Patient presents with  . Shortness of Breath    Brief Admission History:  49 y.o. female, with history of seizures, osteoarthritis, obsessive-compulsive disorder, heart murmur, GERD, fibromyalgia, depression, anxiety presents to the ED at the recommendation of her PCP.  Patient reports that she had contacted her PCP regarding oxygen sats in the low 70s high 60s at home.  PCP told her that she was at risk of death if she did not return to the ER.  Patient had been in the ER on 12/18/2019, and refused remdesivir at that time.  I had seen her at that time and I had counseled her and really tried to compel her to take the treatments but she refused and went home.    Assessment & Plan:   Principal Problem:   Pneumonia due to COVID-19 virus Active Problems:   Headache   Prediabetes   Gastroesophageal reflux disease without esophagitis   COVID-19   Acute respiratory failure with hypoxia (HCC)  1. Acute respiratory failure with hypoxia - secondary to Covid pneumonia.  Her symptoms are rapidly progressing. She is now on heated high flow oxygen 35 L.  She is being transferred to [facility] for higher level of care as no critical care or pulmonologists available at this facility.  2. Covid Pneumonia - Pt initially refused remdesivir several days ago but now agrees to take the remdesivir.  I have discussed with my colleague Dr. Candiss Norse and we both agree that patient could benefit from [place]. I have spoken with patient about it and counseled with her for more than 15 mins and at this time she is refusing to take the actemra.  She says she is not willing to accept my opinion and recommendation and needs more information.  Several other ED staff members are attempting to counsel with her. At this time, I am not sure that she will take the actemra.  I counseled her that she could suffer serious  disability or death as a result of not taking the medication and she verbalized understanding.  For now continue remdesivir, solumedrol and other supportive therapies.  Follow inflammatory markers.  She was placed on full dose enoxaparin anticoagulation given marked d dimer elevation.   DVT prophylaxis:  Enoxaparin  Code Status: Full  Family Communication: telephone call to son, friend, unanswered, unable to leave msg Disposition:  [facility]ring to [facility]   Status is: Inpatient  Remains inpatient appropriate because:IV treatments appropriate due to intensity of illness or inability to take PO and Inpatient level of care appropriate due to severity of illness  Dispo: The patient is from: Home              Anticipated d/c is to: TBD              Anticipated d/c date is: > 3 days              Patient currently is not medically stable to d/c. Consultants:   Dr. Consuelo Pandy Team at [facility]  Procedures:     Antimicrobials:   remdesivir 8/20>>  Subjective: Pt reports cough and shortness of breath.   Objective: Vitals:   12/22/19 0630 12/22/19 0700 12/22/19 0730 12/22/19 0800  BP: 101/68 96/68 107/71 107/69  Pulse: (!) 55 (!) 57 61 (!) 59  Resp: (!) 29 (!) 26 19 (!) 34  Temp:  TempSrc:      SpO2: 91% (!) 85% (!) 85% 92%  Weight:   89.8 kg   Height:   5\' 5"  (1.651 m)     Intake/Output Summary (Last 24 hours) at 12/22/2019 0845 Last data filed at 12/21/2019 2230 Gross per 24 hour  Intake 700.63 ml  Output --  Net 700.63 ml   Filed Weights   12/22/19 0730  Weight: 89.8 kg   Examination:  General exam: Pt awake and able to converse, she is on heated high flow oxygen Respiratory system: scattered rales heard, moderate increased WOB.   Cardiovascular system: normal S1 & S2 sounds heard.  No JVD, murmurs, rubs, gallops or clicks. No pedal edema. Gastrointestinal system:  Abdomen is nondistended, soft and nontender. No organomegaly or masses felt. Normal bowel sounds  heard. Central nervous system: Alert and oriented. No focal neurological deficits. Extremities: Symmetric 5 x 5 power. Skin: No rashes, lesions or ulcers Psychiatry: Flat affect.   Data Reviewed: I have personally reviewed following labs and imaging studies  CBC: Recent Labs  Lab 12/18/19 1437 12/19/19 0537 12/21/19 1958 12/22/19 0529  WBC 4.3 1.7* 7.3 6.0  NEUTROABS  --  1.5* 6.5 5.5  HGB 13.5 11.7* 12.7 11.6*  HCT 39.8 34.7* 37.3 34.5*  MCV 89.0 89.7 90.1 90.8  PLT 134* 129* 243 702    Basic Metabolic Panel: Recent Labs  Lab 12/18/19 1437 12/19/19 0537 12/21/19 1958 12/22/19 0529  NA 133* 136 139 140  K 3.7 3.5 3.5 3.7  CL 98 108 107 111  CO2 22 19* 22 21*  GLUCOSE 147* 222* 136* 152*  BUN 11 11 12 15   CREATININE 0.83 0.68 0.62 0.64  CALCIUM 8.1* 7.5* 8.1* 7.7*  MG  --  1.8  --  2.3  PHOS  --  2.3*  --  3.7    GFR: Estimated Creatinine Clearance: 95.2 mL/min (by C-G formula based on SCr of 0.64 mg/dL).  Liver Function Tests: Recent Labs  Lab 12/18/19 1437 12/19/19 0537 12/21/19 1958 12/22/19 0529  AST 63* 50* 90* 61*  ALT 45* 41 86* 76*  ALKPHOS 81 72 86 74  BILITOT 0.9 0.6 0.7 0.5  PROT 7.2 6.0* 6.9 5.9*  ALBUMIN 3.6 2.9* 3.0* 2.6*    CBG: Recent Labs  Lab 12/19/19 0814 12/19/19 1156  GLUCAP 170* 227*    Recent Results (from the past 240 hour(s))  Blood Culture (routine x 2)     Status: None (Preliminary result)   Collection Time: 12/18/19  2:40 PM   Specimen: Left Antecubital; Blood  Result Value Ref Range Status   Specimen Description LEFT ANTECUBITAL  Final   Special Requests   Final    BOTTLES DRAWN AEROBIC ONLY Blood Culture adequate volume   Culture   Final    NO GROWTH 4 DAYS Performed at [facility], [address], [place], [place] [address]    Report Status PENDING  Incomplete  Blood Culture (routine x 2)     Status: None (Preliminary result)   Collection Time: 12/18/19  6:01 PM   Specimen: Left Antecubital; Blood   Result Value Ref Range Status   Specimen Description LEFT ANTECUBITAL  Final   Special Requests   Final    BOTTLES DRAWN AEROBIC AND ANAEROBIC Blood Culture adequate volume   Culture   Final    NO GROWTH 4 DAYS Performed at [facility], [address] [place], [place] [address]    Report Status PENDING  Incomplete  SARS  Coronavirus 2 by RT PCR (hospital order, performed in [facility] hospital lab) Nasopharyngeal Nasopharyngeal Swab     Status: Abnormal   Collection Time: 12/18/19  6:12 PM   Specimen: Nasopharyngeal Swab  Result Value Ref Range Status   SARS Coronavirus 2 POSITIVE (A) NEGATIVE Final    Comment: RESULT CALLED TO, READ BACK BY AND VERIFIED WITH: S WOODS,RN @1956  12/18/19 MKELLY (NOTE) SARS-CoV-2 target nucleic acids are DETECTED  SARS-CoV-2 RNA is generally detectable in upper respiratory specimens  during the acute phase of infection.  Positive results are indicative  of the presence of the identified virus, but do not rule out bacterial infection or co-infection with other pathogens not detected by the test.  Clinical correlation with patient history and  other diagnostic information is necessary to determine patient infection status.  The expected result is negative.  Fact Sheet for Patients:   StrictlyIdeas.no   Fact Sheet for Healthcare Providers:   BankingDealers.co.za    This test is not yet approved or cleared by the [place] FDA and  has been authorized for detection and/or diagnosis of SARS-CoV-2 by FDA under an Emergency Use Authorization (EUA).  This EUA will remain in effect (meaning this test  can be used) for the duration of  the COVID-19 declaration under Section 564(b)(1) of the Act, 21 U.S.C. section 360-bbb-3(b)(1), unless the authorization is terminated or revoked sooner.  Performed at [facility], [address], [place], [place] [address]   Blood Culture (routine x 2)     Status: None  (Preliminary result)   Collection Time: 12/21/19  8:10 PM   Specimen: Right Antecubital; Blood  Result Value Ref Range Status   Specimen Description RIGHT ANTECUBITAL  Final   Special Requests   Final    BOTTLES DRAWN AEROBIC AND ANAEROBIC Blood Culture adequate volume   Culture   Final    NO GROWTH < 12 HOURS Performed at [facility], [address], [place], [place] [address]    Report Status PENDING  Incomplete  Blood Culture (routine x 2)     Status: None (Preliminary result)   Collection Time: 12/21/19  8:15 PM   Specimen: Left Antecubital; Blood  Result Value Ref Range Status   Specimen Description LEFT ANTECUBITAL  Final   Special Requests   Final    BOTTLES DRAWN AEROBIC AND ANAEROBIC Blood Culture adequate volume   Culture   Final    NO GROWTH < 12 HOURS Performed at [facility], [address] [place], [place] [address]    Report Status PENDING  Incomplete     Radiology Studies: DG Chest Port 1 View  Result Date: 12/21/2019 CLINICAL DATA:  COVID-19 positive patient who developed shortness of breath 3 days ago. EXAM: PORTABLE CHEST 1 VIEW COMPARISON:  PA and lateral chest 12/18/2019. FINDINGS: Bilateral airspace disease consistent with pneumonia has markedly worsened. Heart size is normal. No pneumothorax or pleural effusion. No acute or focal bony abnormality. IMPRESSION: Marked worsening bilateral pneumonia. Electronically Signed   By: Inge Rise M.D.   On: 12/21/2019 20:33   Scheduled Meds: . vitamin C  500 mg Oral Daily  . enoxaparin (LOVENOX) injection  1 mg/kg Subcutaneous Q12H  . methylPREDNISolone (SOLU-MEDROL) injection  60 mg Intravenous Q12H  . pantoprazole  40 mg Oral Daily  . zinc sulfate  220 mg Oral Daily   Continuous Infusions: . sodium chloride 50 mL/hr at 12/22/19 0536  . remdesivir 100 mg in NS 100 mL    .  tocilizumab (ACTEMRA) - non-COVID treatment       LOS: 1 day   Critical Care Procedure Note Authorized and Performed by: Murvin Natal  MD  Total Critical Care time:  45 mins  Due to a high probability of clinically significant, life threatening deterioration, the patient required my highest level of preparedness to intervene emergently and I personally spent this critical care time directly and personally managing the patient.  This critical care time included obtaining a history; examining the patient, pulse oximetry; ordering and review of studies; arranging urgent treatment with development of a management plan; evaluation of patient's response of treatment; frequent reassessment; and discussions with other providers.  This critical care time was performed to assess and manage the high probability of imminent and life threatening deterioration that could result in multi-organ failure.  It was exclusive of separately billable procedures and treating other patients and teaching time.   Irwin Brakeman, MD How to contact the [facility] Attending or Consulting provider [place] or covering provider during after hours [place], for this patient?  1. Check the care team in [facility] and look for a) attending/consulting [facility] provider listed and b) the [facility] team listed 2. Log into [email or web address] and use [place]'s universal password to access. If you do not have the password, please contact the hospital operator. 3. Locate the [facility] provider you are looking for under [facility] and page to a number that you can be directly reached. 4. If you still have difficulty reaching the provider, please page the [facility] (Director on Call) for the Hospitalists listed on amion for assistance.  12/22/2019, 8:45 AM

## 2019-12-22 NOTE — ED Notes (Signed)
Report given to [facility]. ETA 30-40 minutes. Patient aware.

## 2019-12-22 NOTE — ED Notes (Signed)
Dr Tery Sanfilippo and Respiratory therapist in room talking to patient about Actemra due to patient refusal at this time.

## 2019-12-22 NOTE — ED Notes (Signed)
Dr Tery Sanfilippo made aware of patient destat.

## 2019-12-22 NOTE — Progress Notes (Signed)
Patient received to the unit via bed. Patient is alert and oriented x3. Vital signs are stable. Iv in place and running fluid. Skin assessment done with another nurse. Given instructions about call bell and phone. Bed in low position and call in reach.

## 2019-12-22 NOTE — ED Notes (Signed)
Dr Tery Sanfilippo in ED, made aware of patient vomiting, improvement in O2 sat after vomiting, and patient requesting another dose of diazepam via IV. Per Dr Tery Sanfilippo no diazepam to be given at this time.

## 2019-12-22 NOTE — Progress Notes (Signed)
ANTICOAGULATION CONSULT NOTE - Initial Consult  Pharmacy Consult for Lovenox Indication: VTE treatment (rule out)  Allergies  Allergen Reactions  . Fentanyl Other (See Comments)    Doesn't like the way it feels     Patient Measurements: Height: 5\' 5"  (165.1 cm) Weight: 89.8 kg (197 lb 15.6 oz) IBW/kg (Calculated) : 57  Vital Signs: BP: 107/71 (08/21 0730) Pulse Rate: 61 (08/21 0730)  Labs: Recent Labs    12/21/19 1958 12/22/19 0529  HGB 12.7 11.6*  HCT 37.3 34.5*  PLT 243 218  CREATININE 0.62 0.64  TROPONINIHS  --  5    Estimated Creatinine Clearance: 95.2 mL/min (by C-G formula based on SCr of 0.64 mg/dL).   Medical History: Past Medical History:  Diagnosis Date  . Anxiety   . Depression   . Fibromyalgia   . GERD (gastroesophageal reflux disease)   . Headache(784.0)   . Heart murmur   . Hyperplastic colon polyp 10/28/2019   Dr Sydell Axon recommends repeat colonoscopy in 2028.  Marland Kitchen IFG (impaired fasting glucose)   . Obsessive-compulsive disorder   . Osteoarthritis   . Seizures ([place])    last seizure at age 86-stressed induced seizure.    Medications:  See med rec  Assessment: 49 y.o. female, with history of seizures, osteoarthritis, obsessive-compulsive disorder, heart murmur, GERD, fibromyalgia, depression, anxiety presents to the ED at the recommendation of her PCP. She was diagnosed with COVID 12/11/19.  In ED 12/18/19,  and refused remdesivir. She is experiencing SOB, dry cough, fevers, body aches, N/V and some palpitations. Recently started on decadron but now requiring 35 L high flow [place] to maintain saturation at 91 %. Pharmacy asked to start full dose lovenox for VTE.   Goal of Therapy:  Monitor platelets by anticoagulation protocol: Yes   Plan:  Lovenox 1mg /kg sq q12h (dose is 90mg ) Monitor for s/s bleeding F/U US and CTA   Isac Sarna, BS [place], BCPS Clinical Pharmacist Pager [phone] 12/22/2019,7:52 AM

## 2019-12-23 ENCOUNTER — Inpatient Hospital Stay (HOSPITAL_COMMUNITY): Payer: BC Managed Care – PPO

## 2019-12-23 LAB — CULTURE, BLOOD (ROUTINE X 2)
Culture: NO GROWTH
Culture: NO GROWTH
Special Requests: ADEQUATE
Special Requests: ADEQUATE

## 2019-12-23 LAB — CBC
HCT: 37.4 % (ref 36.0–46.0)
Hemoglobin: 12.7 g/dL (ref 12.0–15.0)
MCH: 30.8 pg (ref 26.0–34.0)
MCHC: 34 g/dL (ref 30.0–36.0)
MCV: 90.8 fL (ref 80.0–100.0)
Platelets: 345 10*3/uL (ref 150–400)
RBC: 4.12 MIL/uL (ref 3.87–5.11)
RDW: 13 % (ref 11.5–15.5)
WBC: 7.4 10*3/uL (ref 4.0–10.5)
nRBC: 0.3 % — ABNORMAL HIGH (ref 0.0–0.2)

## 2019-12-23 LAB — COMPREHENSIVE METABOLIC PANEL
ALT: 85 U/L — ABNORMAL HIGH (ref 0–44)
AST: 53 U/L — ABNORMAL HIGH (ref 15–41)
Albumin: 2.6 g/dL — ABNORMAL LOW (ref 3.5–5.0)
Alkaline Phosphatase: 86 U/L (ref 38–126)
Anion gap: 11 (ref 5–15)
BUN: 22 mg/dL — ABNORMAL HIGH (ref 6–20)
CO2: 22 mmol/L (ref 22–32)
Calcium: 8 mg/dL — ABNORMAL LOW (ref 8.9–10.3)
Chloride: 108 mmol/L (ref 98–111)
Creatinine, Ser: 0.77 mg/dL (ref 0.44–1.00)
GFR calc Af Amer: >60 mL/min (ref >60)
GFR calc non Af Amer: >60 mL/min (ref >60)
Glucose, Bld: 137 mg/dL — ABNORMAL HIGH (ref 70–99)
Potassium: 4.1 mmol/L (ref 3.5–5.1)
Sodium: 141 mmol/L (ref 135–145)
Total Bilirubin: 1.1 mg/dL (ref 0.3–1.2)
Total Protein: 5.8 g/dL — ABNORMAL LOW (ref 6.5–8.1)

## 2019-12-23 LAB — D-DIMER, QUANTITATIVE: D-Dimer, Quant: 8.6 ug/mL-FEU — ABNORMAL HIGH (ref 0.00–0.50)

## 2019-12-23 LAB — MAGNESIUM: Magnesium: 2.4 mg/dL (ref 1.7–2.4)

## 2019-12-23 LAB — BRAIN NATRIURETIC PEPTIDE: B Natriuretic Peptide: 260.5 pg/mL — ABNORMAL HIGH (ref 0.0–100.0)

## 2019-12-23 LAB — C-REACTIVE PROTEIN: CRP: 3.3 mg/dL — ABNORMAL HIGH (ref <1.0)

## 2019-12-23 LAB — PROCALCITONIN: Procalcitonin: <0.1 ng/mL

## 2019-12-23 MED ORDER — SUMATRIPTAN SUCCINATE 25 MG PO TABS
25.0000 mg | ORAL_TABLET | ORAL | Status: DC | PRN
  Filled 2019-12-23 (×2): qty 1

## 2019-12-23 MED ORDER — RIZATRIPTAN BENZOATE 10 MG PO TBDP: 10.0000 mg | ORAL_TABLET | Freq: Every day | ORAL | Status: DC | PRN

## 2019-12-23 MED ORDER — VALACYCLOVIR HCL 500 MG PO TABS
500.0000 mg | ORAL_TABLET | Freq: Every day | ORAL | Status: DC
  Administered 2019-12-23 – 2019-12-28 (×6): 500 mg via ORAL
  Filled 2019-12-23 (×6): qty 1

## 2019-12-23 NOTE — Progress Notes (Addendum)
PROGRESS NOTE                                                                                                                                                                                                             Patient Demographics:    Theresa [name], is a 49 y.o. female, DOB - 04-27-1971, OHY:[id number]  Outpatient Primary MD for the patient is Kathyrn Drown, MD    LOS - 2  Admit date - 12/21/2019    Chief Complaint  Patient presents with  . Shortness of Breath       Brief Narrative - Theresa [name]  is a 49 y.o. female, with history of seizures, osteoarthritis, obsessive-compulsive disorder, heart murmur, GERD, fibromyalgia, depression, anxiety presents to the ED at the recommendation of her PCP.  Patient reports that she had contacted her PCP regarding oxygen sats in the low 70s high 60s at home.  PCP told her that she was at risk of death if she did not return to the ER, she arrived to the [facility], ER and was diagnosed with Severe Hypoxic Resp. Failure and was admitted to the hospital.   Subjective:    Theresa [name] today has, No headache, No chest pain, No abdominal pain - No Nausea, No new weakness tingling or numbness, ++ SOB.   Assessment  & Plan :     1. Acute Hypoxic Resp. Failure due to Acute Covid 19 Viral Pneumonitis during the ongoing 2020 Covid 19 Pandemic - she has severe parenchymal injury with extreme hypoxia, she is currently on 50 L heated high flow with nonrebreather mask, and appropriately started on high-dose IV steroids, Solu-Medrol, she received Actemra at the previous hospital while she was in the ER.  Unfortunately due to severe parenchymal injury her overall prognosis is extremely guarded.  Of note she is unvaccinated and her symptoms started around 10 days before presenting to the ER.  Encouraged the patient to sit up in chair in the daytime use I-S and flutter valve for pulmonary  toiletry and then prone in bed when at night.  Will advance activity and titrate down oxygen as possible.   SpO2: 95 % O2 Flow Rate (L/min): 50 L/min FiO2 (%): 100 %  Recent Labs  Lab 12/18/19 1437 12/18/19 1801 12/18/19 1812 12/19/19 0537 12/21/19 1930 12/21/19 1958 12/21/19 2010 12/22/19 0529  12/23/19 0836  WBC 4.3  --   --  1.7*  --  7.3  --  6.0 7.4  PLT 134*  --   --  129*  --  243  --  218 345  CRP  --  7.9*  --  6.6* 10.7*  --   --   --  3.3*  AST 63*  --   --  50*  --  90*  --  61* 53*  ALT 45*  --   --  41  --  86*  --  76* 85*  ALKPHOS 81  --   --  72  --  86  --  74 86  BILITOT 0.9  --   --  0.6  --  0.7  --  0.5 1.1  ALBUMIN 3.6  --   --  2.9*  --  3.0*  --  2.6* 2.6*  DDIMER  --  0.92*  --  0.92*  --  1.70*  --  6.15* 8.60*  PROCALCITON  --  <0.10  --   --   --  <0.10  --   --  <0.10  LATICACIDVEN  --  1.1  --   --   --   --  1.2  --   --   SARSCOV2NAA  --   --  POSITIVE*  --   --   --   --   --   --      2.  COVID-19 pneumonia related asymptomatic transaminitis.  Will trend and monitor.  3.  History of migraine headaches.  Imitrex as needed ordered.  4.Obesity - BMI 33, follow with PCP for weight loss.     Condition - Extremely Guarded  Family Communication  : Son Lissa Hoard on 12/23/2019 at 11:01 AM updated after call back.  Code Status :  Full  Consults  :  None  Procedures  :    TTE - . Left ventricular ejection fraction, by estimation, is 65 to 70%. The left ventricle has normal function. The left ventricle has no regional wall motion abnormalities. Left ventricular diastolic parameters were normal.  2. Right ventricular systolic function is normal. The right ventricular size is normal. There is normal pulmonary artery systolic pressure. The estimated right ventricular systolic pressure is 16.1 mmHg.  3. The mitral valve is grossly normal. Trivial mitral valve regurgitation.  4. The aortic valve is tricuspid. Aortic valve regurgitation is not visualized.   5. The inferior vena cava is normal in size with greater than 50% respiratory variability, suggesting right atrial pressure of 3 mmHg.  PUD Prophylaxis :   Disposition Plan  :    Status is: Inpatient  Remains inpatient appropriate because:IV treatments appropriate due to intensity of illness or inability to take PO   Dispo: The patient is from: Home              Anticipated d/c is to: Home              Anticipated d/c date is: > 3 days              Patient currently is not medically stable to d/c.  DVT Prophylaxis  :  Lovenox   Lab Results  Component Value Date   PLT 345 12/23/2019    Diet :  Diet Order            Diet Heart Room service appropriate? Yes; Fluid consistency: Thin  Diet effective now  Inpatient Medications  Scheduled Meds: . vitamin C  500 mg Oral Daily  . enoxaparin (LOVENOX) injection  1 mg/kg Subcutaneous Q12H  . methylPREDNISolone (SOLU-MEDROL) injection  60 mg Intravenous Q12H  . pantoprazole  40 mg Oral Daily  . valACYclovir  500 mg Oral Daily  . zinc sulfate  220 mg Oral Daily   Continuous Infusions: . sodium chloride 50 mL/hr at 12/22/19 2130  . remdesivir 100 mg in NS 100 mL 100 mg (12/23/19 0808)  . tocilizumab (ACTEMRA) - non-COVID treatment     PRN Meds:.acetaminophen, albuterol, diazepam, guaiFENesin-dextromethorphan, Ipratropium-Albuterol, [DISCONTINUED] ondansetron **OR** ondansetron (ZOFRAN) IV, polyethylene glycol, SUMAtriptan  Antibiotics  :    Anti-infectives (From admission, onward)   Start     Dose/Rate Route Frequency Ordered Stop   12/23/19 1000  valACYclovir (VALTREX) tablet 500 mg        500 mg Oral Daily 12/23/19 0800     12/22/19 1000  remdesivir 100 mg in sodium chloride 0.9 % 100 mL IVPB        100 mg 200 mL/hr over 30 Minutes Intravenous Daily 12/21/19 2040 12/26/19 0959   12/21/19 2100  remdesivir 100 mg in sodium chloride 0.9 % 100 mL IVPB        100 mg 200 mL/hr over 30 Minutes Intravenous  Every 30 min 12/21/19 2039 12/21/19 2230       Time Spent in minutes  30   Lala Lund M.D on 12/23/2019 at 11:01 AM  To page go to [email or web address] - password [place]  [facility] -  Office  [phone]    See all Orders from today for further details    Objective:   Vitals:   12/23/19 0158 12/23/19 0220 12/23/19 0400 12/23/19 0930  BP: 135/79 (!) 140/91 125/81 122/76  Pulse: 64 67 60 65  Resp: (!) 31 18 (!) 21 (!) 24  Temp:  99.8 F (37.7 C) 99 F (37.2 C)   TempSrc:  Axillary Axillary   SpO2: 95% 91% 90% 95%  Weight:      Height:        Wt Readings from Last 3 Encounters:  12/22/19 91.9 kg  12/18/19 89.8 kg  11/15/19 94.7 kg     Intake/Output Summary (Last 24 hours) at 12/23/2019 1101 Last data filed at 12/23/2019 0540 Gross per 24 hour  Intake 1432.28 ml  Output -  Net 1432.28 ml     Physical Exam  Awake Alert, No new F.N deficits, Normal affect [place].AT,PERRAL Supple Neck,No JVD, No cervical lymphadenopathy appriciated.  Symmetrical Chest wall movement, Good air movement bilaterally, CTAB RRR,No Gallops,Rubs or new Murmurs, No Parasternal Heave +ve B.Sounds, Abd Soft, No tenderness, No organomegaly appriciated, No rebound - guarding or rigidity. No Cyanosis, Clubbing or edema, No new Rash or bruise      Data Review:    CBC Recent Labs  Lab 12/18/19 1437 12/19/19 0537 12/21/19 1958 12/22/19 0529 12/23/19 0836  WBC 4.3 1.7* 7.3 6.0 7.4  HGB 13.5 11.7* 12.7 11.6* 12.7  HCT 39.8 34.7* 37.3 34.5* 37.4  PLT 134* 129* 243 218 345  MCV 89.0 89.7 90.1 90.8 90.8  MCH 30.2 30.2 30.7 30.5 30.8  MCHC 33.9 33.7 34.0 33.6 34.0  RDW 12.6 12.8 13.2 13.2 13.0  LYMPHSABS  --  0.1* 0.3* 0.4*  --   MONOABS  --  0.1 0.3 0.1  --   EOSABS  --  0.0 0.0 0.0  --   BASOSABS  --  0.0 0.0 0.0  --  Recent Labs  Lab 12/18/19 1437 12/18/19 1801 12/19/19 0537 12/21/19 1930 12/21/19 1958 12/21/19 2010 12/22/19 0529 12/23/19 0836  NA 133*  --  136  --   139  --  140 141  K 3.7  --  3.5  --  3.5  --  3.7 4.1  CL 98  --  108  --  107  --  111 108  CO2 22  --  19*  --  22  --  21* 22  GLUCOSE 147*  --  222*  --  136*  --  152* 137*  BUN 11  --  11  --  12  --  15 22*  CREATININE 0.83  --  0.68  --  0.62  --  0.64 0.77  CALCIUM 8.1*  --  7.5*  --  8.1*  --  7.7* 8.0*  AST 63*  --  50*  --  90*  --  61* 53*  ALT 45*  --  41  --  86*  --  76* 85*  ALKPHOS 81  --  72  --  86  --  74 86  BILITOT 0.9  --  0.6  --  0.7  --  0.5 1.1  ALBUMIN 3.6  --  2.9*  --  3.0*  --  2.6* 2.6*  MG  --   --  1.8  --   --   --  2.3 2.4  CRP  --  7.9* 6.6* 10.7*  --   --   --  3.3*  DDIMER  --  0.92* 0.92*  --  1.70*  --  6.15* 8.60*  PROCALCITON  --  <0.10  --   --  <0.10  --   --  <0.10  LATICACIDVEN  --  1.1  --   --   --  1.2  --   --   HGBA1C  --   --  5.8*  --   --   --   --   --   BNP  --   --   --   --   --   --   --  260.5*    ------------------------------------------------------------------------------------------------------------------ Recent Labs    12/21/19 1930  TRIG 86    Lab Results  Component Value Date   HGBA1C 5.8 (H) 12/19/2019   ------------------------------------------------------------------------------------------------------------------ No results for input(s): TSH, T4TOTAL, T3FREE, THYROIDAB in the last 72 hours.  Invalid input(s): FREET3  Cardiac Enzymes No results for input(s): CKMB, TROPONINI, MYOGLOBIN in the last 168 hours.  Invalid input(s): CK ------------------------------------------------------------------------------------------------------------------    Component Value Date/Time   BNP 260.5 (H) 12/23/2019 8676    Micro Results Recent Results (from the past 240 hour(s))  Blood Culture (routine x 2)     Status: None   Collection Time: 12/18/19  2:40 PM   Specimen: Left Antecubital; Blood  Result Value Ref Range Status   Specimen Description LEFT ANTECUBITAL  Final   Special Requests   Final     BOTTLES DRAWN AEROBIC ONLY Blood Culture adequate volume   Culture   Final    NO GROWTH 5 DAYS Performed at [facility], [address] [place], [place] [address]    Report Status 12/23/2019 FINAL  Final  Blood Culture (routine x 2)     Status: None   Collection Time: 12/18/19  6:01 PM   Specimen: Left Antecubital; Blood  Result Value Ref Range Status   Specimen Description LEFT ANTECUBITAL  Final  Special Requests   Final    BOTTLES DRAWN AEROBIC AND ANAEROBIC Blood Culture adequate volume   Culture   Final    NO GROWTH 5 DAYS Performed at [facility], [address] [place], [place] [address]    Report Status 12/23/2019 FINAL  Final  SARS Coronavirus 2 by RT PCR (hospital order, performed in [facility] hospital lab) Nasopharyngeal Nasopharyngeal Swab     Status: Abnormal   Collection Time: 12/18/19  6:12 PM   Specimen: Nasopharyngeal Swab  Result Value Ref Range Status   SARS Coronavirus 2 POSITIVE (A) NEGATIVE Final    Comment: RESULT CALLED TO, READ BACK BY AND VERIFIED WITH: S WOODS,RN @1956  12/18/19 MKELLY (NOTE) SARS-CoV-2 target nucleic acids are DETECTED  SARS-CoV-2 RNA is generally detectable in upper respiratory specimens  during the acute phase of infection.  Positive results are indicative  of the presence of the identified virus, but do not rule out bacterial infection or co-infection with other pathogens not detected by the test.  Clinical correlation with patient history and  other diagnostic information is necessary to determine patient infection status.  The expected result is negative.  Fact Sheet for Patients:   StrictlyIdeas.no   Fact Sheet for Healthcare Providers:   BankingDealers.co.za    This test is not yet approved or cleared by the [place] FDA and  has been authorized for detection and/or diagnosis of SARS-CoV-2 by FDA under an Emergency Use Authorization (EUA).  This EUA will remain in  effect (meaning this test  can be used) for the duration of  the COVID-19 declaration under Section 564(b)(1) of the Act, 21 U.S.C. section 360-bbb-3(b)(1), unless the authorization is terminated or revoked sooner.  Performed at [facility], [address] [place], [place] [address]   Blood Culture (routine x 2)     Status: None (Preliminary result)   Collection Time: 12/21/19  8:10 PM   Specimen: Right Antecubital; Blood  Result Value Ref Range Status   Specimen Description RIGHT ANTECUBITAL  Final   Special Requests   Final    BOTTLES DRAWN AEROBIC AND ANAEROBIC Blood Culture adequate volume   Culture   Final    NO GROWTH 2 DAYS Performed at [facility], [address] [place], [place] [address]    Report Status PENDING  Incomplete  Blood Culture (routine x 2)     Status: None (Preliminary result)   Collection Time: 12/21/19  8:15 PM   Specimen: Left Antecubital; Blood  Result Value Ref Range Status   Specimen Description LEFT ANTECUBITAL  Final   Special Requests   Final    BOTTLES DRAWN AEROBIC AND ANAEROBIC Blood Culture adequate volume   Culture   Final    NO GROWTH 2 DAYS Performed at [facility], [address], [place], [place] [address]    Report Status PENDING  Incomplete    Radiology Reports DG Chest Port 1 View  Result Date: 12/23/2019 CLINICAL DATA:  COVID-19. EXAM: PORTABLE CHEST 1 VIEW COMPARISON:  December 21, 2019 FINDINGS: Bilateral pulmonary infiltrates are less patchy more diffuse in the interval. The cardiomediastinal silhouette is stable. No pneumothorax. IMPRESSION: The patient's known COVID-19 pneumonia is less patchy and more diffuse in the interval, particularly on the left. No other changes. Electronically Signed   By: Dorise Bullion III M.D   On: 12/23/2019 08:39   DG Chest Port 1 View  Result Date: 12/21/2019 CLINICAL DATA:  COVID-19 positive patient who developed shortness of breath 3 days ago.  EXAM: PORTABLE CHEST 1 VIEW COMPARISON:  PA and  lateral chest 12/18/2019. FINDINGS: Bilateral airspace disease consistent with pneumonia has markedly worsened. Heart size is normal. No pneumothorax or pleural effusion. No acute or focal bony abnormality. IMPRESSION: Marked worsening bilateral pneumonia. Electronically Signed   By: Inge Rise M.D.   On: 12/21/2019 20:33   DG Chest Port 1 View  Result Date: 12/18/2019 CLINICAL DATA:  Hypotension, COVID-19 positive, shortness of breath and cough EXAM: PORTABLE CHEST 1 VIEW COMPARISON:  None. FINDINGS: Single frontal view of the chest demonstrates an unremarkable cardiac silhouette. There is mild diffuse interstitial prominence, with patchy bilateral ground-glass airspace disease consistent with multifocal pneumonia and diagnosis of COVID-19. No effusion or pneumothorax. No acute bony abnormalities. IMPRESSION: 1. Multifocal bilateral pneumonia consistent with COVID 19. Electronically Signed   By: Randa Ngo M.D.   On: 12/18/2019 17:20   ECHOCARDIOGRAM COMPLETE  Result Date: 12/19/2019    ECHOCARDIOGRAM REPORT   Patient Name:   ANGELIA HAZELL Date of Exam: 12/19/2019 Medical Rec #:  [id number]       Height:       65.0 in Accession #:    [id number]      Weight:       198.0 lb Date of Birth:  1970-08-31       BSA:          1.970 m Patient Age:    44 years        BP:           122/84 mmHg Patient Gender: F               HR:           83 bpm. Exam Location:  [facility] Procedure: 2D Echo Indications:    Syncope 780.2 / R55  History:        Patient has no prior history of Echocardiogram examinations.                 Risk Factors:Non-Smoker and Hypertension. Covid 19 positive.  Sonographer:    Leavy Cella RDCS (AE) Referring Phys: [place]  1. Left ventricular ejection fraction, by estimation, is 65 to 70%. The left ventricle has normal function. The left ventricle has no regional wall motion abnormalities. Left ventricular diastolic parameters were normal.  2. Right  ventricular systolic function is normal. The right ventricular size is normal. There is normal pulmonary artery systolic pressure. The estimated right ventricular systolic pressure is 78.4 mmHg.  3. The mitral valve is grossly normal. Trivial mitral valve regurgitation.  4. The aortic valve is tricuspid. Aortic valve regurgitation is not visualized.  5. The inferior vena cava is normal in size with greater than 50% respiratory variability, suggesting right atrial pressure of 3 mmHg. FINDINGS  Left Ventricle: Left ventricular ejection fraction, by estimation, is 65 to 70%. The left ventricle has normal function. The left ventricle has no regional wall motion abnormalities. The left ventricular internal cavity size was normal in size. There is  no left ventricular hypertrophy. Left ventricular diastolic parameters were normal. Right Ventricle: The right ventricular size is normal. No increase in right ventricular wall thickness. Right ventricular systolic function is normal. There is normal pulmonary artery systolic pressure. The tricuspid regurgitant velocity is 1.73 m/s, and  with an assumed right atrial pressure of 3 mmHg, the estimated right ventricular systolic pressure is 69.6 mmHg. Left Atrium: Left atrial size was normal in size. Right Atrium: Right atrial  size was normal in size. Pericardium: There is no evidence of pericardial effusion. Mitral Valve: The mitral valve is grossly normal. Trivial mitral valve regurgitation. Tricuspid Valve: The tricuspid valve is grossly normal. Tricuspid valve regurgitation is trivial. Aortic Valve: The aortic valve is tricuspid. Aortic valve regurgitation is not visualized. Mild aortic valve annular calcification. Pulmonic Valve: The pulmonic valve was grossly normal. Pulmonic valve regurgitation is trivial. Aorta: The aortic root is normal in size and structure. Venous: The inferior vena cava is normal in size with greater than 50% respiratory variability, suggesting right  atrial pressure of 3 mmHg. IAS/Shunts: No atrial level shunt detected by color flow Doppler.  LEFT VENTRICLE PLAX 2D LVIDd:         4.77 cm  Diastology LVIDs:         2.80 cm  LV e' lateral:   10.00 cm/s LV PW:         1.19 cm  LV E/e' lateral: 7.8 LV IVS:        1.02 cm  LV e' medial:    8.38 cm/s LVOT diam:     2.00 cm  LV E/e' medial:  9.3 LVOT Area:     3.14 cm  RIGHT VENTRICLE RV S prime:     11.60 cm/s TAPSE (M-mode): 2.2 cm LEFT ATRIUM             Index       RIGHT ATRIUM           Index LA diam:        3.70 cm 1.88 cm/m  RA Area:     10.40 cm LA Vol (A2C):   43.0 ml 21.83 ml/m RA Volume:   21.60 ml  10.97 ml/m LA Vol (A4C):   40.8 ml 20.71 ml/m LA Biplane Vol: 44.0 ml 22.34 ml/m   AORTA Ao Root diam: 3.10 cm MITRAL VALVE               TRICUSPID VALVE MV Area (PHT): 3.28 cm    TR Peak grad:   12.0 mmHg MV Decel Time: 231 msec    TR Vmax:        173.00 cm/s MV E velocity: 78.00 cm/s MV A velocity: 56.10 cm/s  SHUNTS MV E/A ratio:  1.39        Systemic Diam: 2.00 cm Rozann Lesches MD Electronically signed by Rozann Lesches MD Signature Date/Time: 12/19/2019/10:26:56 AM    Final

## 2019-12-24 ENCOUNTER — Inpatient Hospital Stay (HOSPITAL_COMMUNITY): Payer: BC Managed Care – PPO

## 2019-12-24 LAB — COMPREHENSIVE METABOLIC PANEL
ALT: 76 U/L — ABNORMAL HIGH (ref 0–44)
AST: 37 U/L (ref 15–41)
Albumin: 2.4 g/dL — ABNORMAL LOW (ref 3.5–5.0)
Alkaline Phosphatase: 79 U/L (ref 38–126)
Anion gap: 8 (ref 5–15)
BUN: 20 mg/dL (ref 6–20)
CO2: 23 mmol/L (ref 22–32)
Calcium: 8 mg/dL — ABNORMAL LOW (ref 8.9–10.3)
Chloride: 109 mmol/L (ref 98–111)
Creatinine, Ser: 0.73 mg/dL (ref 0.44–1.00)
GFR calc Af Amer: >60 mL/min (ref >60)
GFR calc non Af Amer: >60 mL/min (ref >60)
Glucose, Bld: 153 mg/dL — ABNORMAL HIGH (ref 70–99)
Potassium: 3.8 mmol/L (ref 3.5–5.1)
Sodium: 140 mmol/L (ref 135–145)
Total Bilirubin: 0.7 mg/dL (ref 0.3–1.2)
Total Protein: 5.5 g/dL — ABNORMAL LOW (ref 6.5–8.1)

## 2019-12-24 LAB — CBC WITH DIFFERENTIAL/PLATELET
Abs Immature Granulocytes: 0.15 10*3/uL — ABNORMAL HIGH (ref 0.00–0.07)
Basophils Absolute: 0 10*3/uL (ref 0.0–0.1)
Basophils Relative: 0 %
Eosinophils Absolute: 0 10*3/uL (ref 0.0–0.5)
Eosinophils Relative: 0 %
HCT: 33.9 % — ABNORMAL LOW (ref 36.0–46.0)
Hemoglobin: 11.4 g/dL — ABNORMAL LOW (ref 12.0–15.0)
Immature Granulocytes: 2 %
Lymphocytes Relative: 4 %
Lymphs Abs: 0.3 10*3/uL — ABNORMAL LOW (ref 0.7–4.0)
MCH: 29.7 pg (ref 26.0–34.0)
MCHC: 33.6 g/dL (ref 30.0–36.0)
MCV: 88.3 fL (ref 80.0–100.0)
Monocytes Absolute: 0.3 10*3/uL (ref 0.1–1.0)
Monocytes Relative: 4 %
Neutro Abs: 6.5 10*3/uL (ref 1.7–7.7)
Neutrophils Relative %: 90 %
Platelets: 357 10*3/uL (ref 150–400)
RBC: 3.84 MIL/uL — ABNORMAL LOW (ref 3.87–5.11)
RDW: 13 % (ref 11.5–15.5)
WBC: 7.2 10*3/uL (ref 4.0–10.5)
nRBC: 0 % (ref 0.0–0.2)

## 2019-12-24 LAB — BRAIN NATRIURETIC PEPTIDE: B Natriuretic Peptide: 140.6 pg/mL — ABNORMAL HIGH (ref 0.0–100.0)

## 2019-12-24 LAB — C-REACTIVE PROTEIN: CRP: 1.4 mg/dL — ABNORMAL HIGH (ref <1.0)

## 2019-12-24 LAB — D-DIMER, QUANTITATIVE: D-Dimer, Quant: 5.82 ug/mL-FEU — ABNORMAL HIGH (ref 0.00–0.50)

## 2019-12-24 LAB — MAGNESIUM: Magnesium: 2.3 mg/dL (ref 1.7–2.4)

## 2019-12-24 LAB — PROCALCITONIN: Procalcitonin: <0.1 ng/mL

## 2019-12-24 NOTE — Plan of Care (Signed)
  Problem: Education: Goal: Knowledge of General Education information will improve Description: Including pain rating scale, medication(s)/side effects and non-pharmacologic comfort measures Outcome: Progressing   Problem: Health Behavior/Discharge Planning: Goal: Ability to manage health-related needs will improve Outcome: Progressing   Problem: Clinical Measurements: Goal: Respiratory complications will improve Outcome: Progressing   Problem: Activity: Goal: Risk for activity intolerance will decrease Outcome: Progressing  O2 turned down by MD, patient still using NRB PRN. Patient able to transfer to chair, asking for assistance to complete hygiene tasks.

## 2019-12-24 NOTE — Evaluation (Signed)
Physical Therapy Evaluation Patient Details Name: Theresa [name] MRN: [id number] DOB: 1971/01/07 Today's Date: 12/24/2019   History of Present Illness  49 y.o. female, with history of seizures, osteoarthritis, obsessive-compulsive disorder, heart murmur, GERD, fibromyalgia, depression, anxiety presented to the ED 12/21/19 with severe hypoxia. +COVID 19 as of 12/11/19  Clinical Impression   Pt admitted with above diagnosis. She was independent PTA and working full-time. She lives with her 75 yo daughter.  Patient currently on [place] 80% FiO2 @ 25L, plus intermittently using NRB 15L (per patient, MD told her to use NRB as she felt she needed). She was attentive to education on pursed lip breathing (and return demonstrated well with RR decreasing from 37 to 22, sats increasing from 92% to 98%) Also educated in use of and frequency with incentive spirometer and flutter valve. Patient with good return demonstration with +productive cough. After education on multiple things she should be working on, she refused to attempt standing due to "rough time" getting to chair this morning and "finally feeling good." Pt currently with functional limitations due to the deficits listed below (see PT Problem List). Pt will benefit from skilled PT to increase their independence and safety with mobility to allow discharge to the venue listed below.       Follow Up Recommendations Home health PT    Equipment Recommendations  None recommended by PT    Recommendations for Other Services OT consult     Precautions / Restrictions        Mobility  Bed Mobility               General bed mobility comments: up in recliner  Transfers                 General transfer comment: pt refused to attempt standing; reports bed to chair earlier today the tech got her tangled up, she got tired and panicky and almost fell. "I feel good right now and would rather not."  Ambulation/Gait                 Stairs            Wheelchair Mobility    Modified Rankin (Stroke Patients Only)       Balance                                             Pertinent Vitals/Pain Pain Assessment: No/denies pain    Home Living Family/patient expects to be discharged to:: Private residence Living Arrangements: Children (70 yo daughter (tested negative)) Available Help at Discharge: Family;Available PRN/intermittently Type of Home: House Home Access: Stairs to enter   [employer] of Steps: 5 Home Layout: One level   Additional Comments: Full set-up TBA    Prior Function Level of Independence: Independent         Comments: works in [employer] under[employer] (has not worked from home; been in the office entire pandemic)     Journalist, newspaper        Extremity/Trunk Assessment   Upper Extremity Assessment Upper Extremity Assessment: Generalized weakness    Lower Extremity Assessment Lower Extremity Assessment: Generalized weakness       Communication   Communication: Other (comment) (low volume; one-two words at a time)  Cognition Arousal/Alertness: Awake/alert Behavior During Therapy: Flat affect (reports she feels anxious) Overall Cognitive Status: Within Functional Limits for tasks assessed (  not specifically tested)                                        General Comments General comments (skin integrity, edema, etc.): Educated on importance of positioning (she states she can get prone when in bed), fluid intake, and use of IS and flutter valve.     Exercises Other Exercises Other Exercises: Patient with order for flutter valve and IS. Obtained for pt and educated in proper use and frequency (up to 10x/hr--doing 2-4 at a time, multiple times per hour). Educated on purpose of each and importance for lung function/recovery. Pt able to pull 720ml   Assessment/Plan    PT Assessment Patient needs continued PT services  PT Problem List  Decreased strength;Decreased activity tolerance;Decreased mobility;Cardiopulmonary status limiting activity       PT Treatment Interventions DME instruction;Gait training;Functional mobility training;Therapeutic activities;Therapeutic exercise;Patient/family education    PT Goals (Current goals can be found in the Care Plan section)  Acute Rehab PT Goals Patient Stated Goal: get well and go home PT Goal Formulation: With patient Time For Goal Achievement: 01/07/20 Potential to Achieve Goals: Good    Frequency Min 3X/week   Barriers to discharge Decreased caregiver support lives with 34 yo daughter    Co-evaluation               AM-PAC PT "6 Clicks" Mobility  Outcome Measure Help needed turning from your back to your side while in a flat bed without using bedrails?: None Help needed moving from lying on your back to sitting on the side of a flat bed without using bedrails?: A Little Help needed moving to and from a bed to a chair (including a wheelchair)?: A Little Help needed standing up from a chair using your arms (e.g., wheelchair or bedside chair)?: A Little Help needed to walk in hospital room?: Total Help needed climbing 3-5 steps with a railing? : Total 6 Click Score: 15    End of Session Equipment Utilized During Treatment: Oxygen ([facility] 25L 80% + NRB 15L) Activity Tolerance: Patient tolerated treatment well (although limited activity) Patient left: in chair;with call bell/phone within reach Nurse Communication: Other (comment) (provided flutter valve and IS and educated) PT Visit Diagnosis: Difficulty in walking, not elsewhere classified (R26.2);Muscle weakness (generalized) (M62.81)    Time: 0277-4128 PT Time Calculation (min) (ACUTE ONLY): 33 min   Charges:   PT Evaluation $PT Eval Moderate Complexity: 1 Mod PT Treatments $Therapeutic Exercise: 8-22 mins         Arby Barrette, PT Pager [phone]   Rexanne Mano 12/24/2019, 2:33 PM

## 2019-12-24 NOTE — Progress Notes (Addendum)
PROGRESS NOTE                                                                                                                                                                                                             Patient Demographics:    Theresa [name], is a 49 y.o. female, DOB - [birth year], JQZ:[id number]  Outpatient Primary MD for the patient is Kathyrn Drown, MD    LOS - 3  Admit date - 12/21/2019    Chief Complaint  Patient presents with  . Shortness of Breath       Brief Narrative - Theresa [name]  is a 49 y.o. female, with history of seizures, osteoarthritis, obsessive-compulsive disorder, heart murmur, GERD, fibromyalgia, depression, anxiety presents to the ED at the recommendation of her PCP.  Patient reports that she had contacted her PCP regarding oxygen sats in the low 70s high 60s at home.  PCP told her that she was at risk of death if she did not return to the ER, she arrived to the [facility] ER and was diagnosed with Severe Hypoxic Resp. Failure and was admitted to the hospital.   Subjective:   Patient in bed, appears comfortable, denies any headache, no fever, no chest pain or pressure, improved shortness of breath , no abdominal pain. No focal weakness.   Assessment  & Plan :     1. Acute Hypoxic Resp. Failure due to Acute Covid 19 Viral Pneumonitis during the ongoing 2020 Covid 19 Pandemic - she has severe parenchymal injury with extreme hypoxia, on high-dose IV steroids, Solu-Medrol, she received Actemra at the previous hospital while she was in the ER.  She was initially on 15 L heated high flow with nonrebreather mask, now she is responding quite well to the treatment and down to 25 L of heated high flow only, will monitor closely, unfortunately she is not vaccinated.  Encouraged the patient to sit up in chair in the daytime use I-S and flutter valve for pulmonary toiletry and then prone in bed when  at night.  Will advance activity and titrate down oxygen as possible.   SpO2: 100 % O2 Flow Rate (L/min): 25 L/min FiO2 (%): 80 %  Recent Labs  Lab 12/18/19 1437 12/18/19 1801 12/18/19 1812 12/19/19 0537 12/21/19 1930 12/21/19 1958 12/21/19 2010 12/22/19 0529 12/23/19 0836 12/24/19 0401  WBC   < >  --   --  1.7*  --  7.3  --  6.0 7.4 7.2  PLT   < >  --   --  129*  --  243  --  218 345 357  CRP  --  7.9*  --  6.6* 10.7*  --   --   --  3.3* 1.4*  AST   < >  --   --  50*  --  90*  --  61* 53* 37  ALT   < >  --   --  41  --  86*  --  76* 85* 76*  ALKPHOS   < >  --   --  72  --  86  --  74 86 79  BILITOT   < >  --   --  0.6  --  0.7  --  0.5 1.1 0.7  ALBUMIN   < >  --   --  2.9*  --  3.0*  --  2.6* 2.6* 2.4*  DDIMER   < > 0.92*  --  0.92*  --  1.70*  --  6.15* 8.60* 5.82*  PROCALCITON  --  <0.10  --   --   --  <0.10  --   --  <0.10 <0.10  LATICACIDVEN  --  1.1  --   --   --   --  1.2  --   --   --   SARSCOV2NAA  --   --  POSITIVE*  --   --   --   --   --   --   --    < > = values in this interval not displayed.     2.  COVID-19 pneumonia related asymptomatic transaminitis.  Will trend and monitor.  3.  History of migraine headaches.  Imitrex as needed ordered.  4. Obesity - BMI 33, follow with PCP for weight loss.  5.  Extremely high D-dimer could be due to inflammation, on high-dose Lovenox and D-dimer trending down, will also check leg ultrasound.    Condition - Extremely Guarded  Family Communication  : Loistine Chance [phone]   on 12/23/2019 at 11:01 AM updated after call back, 12/24/19 at 8:15 AM.     Code Status :  Full  Consults  :  None  Procedures  :    TTE - . Left ventricular ejection fraction, by estimation, is 65 to 70%. The left ventricle has normal function. The left ventricle has no regional wall motion abnormalities. Left ventricular diastolic parameters were normal.  2. Right ventricular systolic function is normal. The right ventricular size is normal.  There is normal pulmonary artery systolic pressure. The estimated right ventricular systolic pressure is 81.8 mmHg.  3. The mitral valve is grossly normal. Trivial mitral valve regurgitation.  4. The aortic valve is tricuspid. Aortic valve regurgitation is not visualized.  5. The inferior vena cava is normal in size with greater than 50% respiratory variability, suggesting right atrial pressure of 3 mmHg.  PUD Prophylaxis : PPI  Disposition Plan  :    Status is: Inpatient  Remains inpatient appropriate because:IV treatments appropriate due to intensity of illness or inability to take PO   Dispo: The patient is from: Home              Anticipated d/c is to: Home              Anticipated d/c date is: >  3 days              Patient currently is not medically stable to d/c.  DVT Prophylaxis  :  Lovenox   Lab Results  Component Value Date   PLT 357 12/24/2019    Diet :  Diet Order            Diet Heart Room service appropriate? Yes; Fluid consistency: Thin  Diet effective now                  Inpatient Medications  Scheduled Meds: . vitamin C  500 mg Oral Daily  . enoxaparin (LOVENOX) injection  1 mg/kg Subcutaneous Q12H  . methylPREDNISolone (SOLU-MEDROL) injection  60 mg Intravenous Q12H  . pantoprazole  40 mg Oral Daily  . valACYclovir  500 mg Oral Daily  . zinc sulfate  220 mg Oral Daily   Continuous Infusions: . remdesivir 100 mg in NS 100 mL 100 mg (12/23/19 0808)  . tocilizumab (ACTEMRA) - non-COVID treatment     PRN Meds:.acetaminophen, albuterol, diazepam, guaiFENesin-dextromethorphan, Ipratropium-Albuterol, [DISCONTINUED] ondansetron **OR** ondansetron (ZOFRAN) IV, polyethylene glycol, SUMAtriptan  Antibiotics  :    Anti-infectives (From admission, onward)   Start     Dose/Rate Route Frequency Ordered Stop   12/23/19 1000  valACYclovir (VALTREX) tablet 500 mg        500 mg Oral Daily 12/23/19 0800     12/22/19 1000  remdesivir 100 mg in sodium chloride 0.9 %  100 mL IVPB        100 mg 200 mL/hr over 30 Minutes Intravenous Daily 12/21/19 2040 12/26/19 0959   12/21/19 2100  remdesivir 100 mg in sodium chloride 0.9 % 100 mL IVPB        100 mg 200 mL/hr over 30 Minutes Intravenous Every 30 min 12/21/19 2039 12/21/19 2230       Time Spent in minutes  30   Lala Lund M.D on 12/24/2019 at 8:11 AM  To page go to [email or web address] - password [facility]  [facility] -  Office  [phone]    See all Orders from today for further details    Objective:   Vitals:   12/24/19 0156 12/24/19 0404 12/24/19 0405 12/24/19 0802  BP:   106/66 117/81  Pulse: (!) 54 68 64 63  Resp: (!) 25 (!) 23 (!) 21 18  Temp:   97.8 F (36.6 C) 98.3 F (36.8 C)  TempSrc:   Axillary Oral  SpO2: 99% 98% 98% 100%  Weight:      Height:        Wt Readings from Last 3 Encounters:  12/22/19 91.9 kg  12/18/19 89.8 kg  11/15/19 94.7 kg     Intake/Output Summary (Last 24 hours) at 12/24/2019 0811 Last data filed at 12/23/2019 1300 Gross per 24 hour  Intake 240 ml  Output --  Net 240 ml     Physical Exam  Awake Alert, No new F.N deficits, Normal affect [place].AT,PERRAL Supple Neck,No JVD, No cervical lymphadenopathy appriciated.  Symmetrical Chest wall movement, Good air movement bilaterally, CTAB RRR,No Gallops, Rubs or new Murmurs, No Parasternal Heave +ve B.Sounds, Abd Soft, No tenderness, No organomegaly appriciated, No rebound - guarding or rigidity. No Cyanosis, Clubbing or edema, No new Rash or bruise    Data Review:    CBC Recent Labs  Lab 12/19/19 0537 12/21/19 1958 12/22/19 0529 12/23/19 0836 12/24/19 0401  WBC 1.7* 7.3 6.0 7.4 7.2  HGB 11.7* 12.7 11.6* 12.7 11.4*  HCT  34.7* 37.3 34.5* 37.4 33.9*  PLT 129* 243 218 345 357  MCV 89.7 90.1 90.8 90.8 88.3  MCH 30.2 30.7 30.5 30.8 29.7  MCHC 33.7 34.0 33.6 34.0 33.6  RDW 12.8 13.2 13.2 13.0 13.0  LYMPHSABS 0.1* 0.3* 0.4*  --  0.3*  MONOABS 0.1 0.3 0.1  --  0.3  EOSABS 0.0 0.0 0.0  --   0.0  BASOSABS 0.0 0.0 0.0  --  0.0    Recent Labs  Lab 12/18/19 1437 12/18/19 1801 12/19/19 0537 12/21/19 1930 12/21/19 1958 12/21/19 2010 12/22/19 0529 12/23/19 0836 12/24/19 0401  NA   < >  --  136  --  139  --  140 141 140  K   < >  --  3.5  --  3.5  --  3.7 4.1 3.8  CL   < >  --  108  --  107  --  111 108 109  CO2   < >  --  19*  --  22  --  21* 22 23  GLUCOSE   < >  --  222*  --  136*  --  152* 137* 153*  BUN   < >  --  11  --  12  --  15 22* 20  CREATININE   < >  --  0.68  --  0.62  --  0.64 0.77 0.73  CALCIUM   < >  --  7.5*  --  8.1*  --  7.7* 8.0* 8.0*  AST   < >  --  50*  --  90*  --  61* 53* 37  ALT   < >  --  41  --  86*  --  76* 85* 76*  ALKPHOS   < >  --  72  --  86  --  74 86 79  BILITOT   < >  --  0.6  --  0.7  --  0.5 1.1 0.7  ALBUMIN   < >  --  2.9*  --  3.0*  --  2.6* 2.6* 2.4*  MG  --   --  1.8  --   --   --  2.3 2.4 2.3  CRP  --  7.9* 6.6* 10.7*  --   --   --  3.3* 1.4*  DDIMER   < > 0.92* 0.92*  --  1.70*  --  6.15* 8.60* 5.82*  PROCALCITON  --  <0.10  --   --  <0.10  --   --  <0.10 <0.10  LATICACIDVEN  --  1.1  --   --   --  1.2  --   --   --   HGBA1C  --   --  5.8*  --   --   --   --   --   --   BNP  --   --   --   --   --   --   --  260.5* 140.6*   < > = values in this interval not displayed.    ------------------------------------------------------------------------------------------------------------------ Recent Labs    12/21/19 1930  TRIG 86    Lab Results  Component Value Date   HGBA1C 5.8 (H) 12/19/2019   ------------------------------------------------------------------------------------------------------------------ No results for input(s): TSH, T4TOTAL, T3FREE, THYROIDAB in the last 72 hours.  Invalid input(s): FREET3  Cardiac Enzymes No results for input(s): CKMB, TROPONINI, MYOGLOBIN in the last 168 hours.  Invalid input(s):  CK ------------------------------------------------------------------------------------------------------------------  Component Value Date/Time   BNP 140.6 (H) 12/24/2019 0401    Micro Results Recent Results (from the past 240 hour(s))  Blood Culture (routine x 2)     Status: None   Collection Time: 12/18/19  2:40 PM   Specimen: Left Antecubital; Blood  Result Value Ref Range Status   Specimen Description LEFT ANTECUBITAL  Final   Special Requests   Final    BOTTLES DRAWN AEROBIC ONLY Blood Culture adequate volume   Culture   Final    NO GROWTH 5 DAYS Performed at [facility], [address], [place], [place] [address]    Report Status 12/23/2019 FINAL  Final  Blood Culture (routine x 2)     Status: None   Collection Time: 12/18/19  6:01 PM   Specimen: Left Antecubital; Blood  Result Value Ref Range Status   Specimen Description LEFT ANTECUBITAL  Final   Special Requests   Final    BOTTLES DRAWN AEROBIC AND ANAEROBIC Blood Culture adequate volume   Culture   Final    NO GROWTH 5 DAYS Performed at [facility], [address], [place], [place] [address]    Report Status 12/23/2019 FINAL  Final  SARS Coronavirus 2 by RT PCR (hospital order, performed in [place] hospital lab) Nasopharyngeal Nasopharyngeal Swab     Status: Abnormal   Collection Time: 12/18/19  6:12 PM   Specimen: Nasopharyngeal Swab  Result Value Ref Range Status   SARS Coronavirus 2 POSITIVE (A) NEGATIVE Final    Comment: RESULT CALLED TO, READ BACK BY AND VERIFIED WITH: S WOODS,RN @1956  12/18/19 MKELLY (NOTE) SARS-CoV-2 target nucleic acids are DETECTED  SARS-CoV-2 RNA is generally detectable in upper respiratory specimens  during the acute phase of infection.  Positive results are indicative  of the presence of the identified virus, but do not rule out bacterial infection or co-infection with other pathogens not detected by the test.  Clinical correlation with patient history and  other diagnostic  information is necessary to determine patient infection status.  The expected result is negative.  Fact Sheet for Patients:   StrictlyIdeas.no   Fact Sheet for Healthcare Providers:   BankingDealers.co.za    This test is not yet approved or cleared by the [place] FDA and  has been authorized for detection and/or diagnosis of SARS-CoV-2 by FDA under an Emergency Use Authorization (EUA).  This EUA will remain in effect (meaning this test  can be used) for the duration of  the COVID-19 declaration under Section 564(b)(1) of the Act, 21 U.S.C. section 360-bbb-3(b)(1), unless the authorization is terminated or revoked sooner.  Performed at [facility], [address], [place], [place] [address]   Blood Culture (routine x 2)     Status: None (Preliminary result)   Collection Time: 12/21/19  8:10 PM   Specimen: Right Antecubital; Blood  Result Value Ref Range Status   Specimen Description RIGHT ANTECUBITAL  Final   Special Requests   Final    BOTTLES DRAWN AEROBIC AND ANAEROBIC Blood Culture adequate volume   Culture   Final    NO GROWTH 2 DAYS Performed at [facility], [address], [place], [place] [address]    Report Status PENDING  Incomplete  Blood Culture (routine x 2)     Status: None (Preliminary result)   Collection Time: 12/21/19  8:15 PM   Specimen: Left Antecubital; Blood  Result Value Ref Range Status   Specimen Description LEFT ANTECUBITAL  Final   Special Requests  Final    BOTTLES DRAWN AEROBIC AND ANAEROBIC Blood Culture adequate volume   Culture   Final    NO GROWTH 2 DAYS Performed at [facility], [address] [place], [place] [address]    Report Status PENDING  Incomplete    Radiology Reports DG Chest Port 1 View  Result Date: 12/23/2019 CLINICAL DATA:  COVID-19. EXAM: PORTABLE CHEST 1 VIEW COMPARISON:  December 21, 2019 FINDINGS: Bilateral pulmonary infiltrates are less patchy more diffuse in the  interval. The cardiomediastinal silhouette is stable. No pneumothorax. IMPRESSION: The patient's known COVID-19 pneumonia is less patchy and more diffuse in the interval, particularly on the left. No other changes. Electronically Signed   By: Dorise Bullion III M.D   On: 12/23/2019 08:39   DG Chest Port 1 View  Result Date: 12/21/2019 CLINICAL DATA:  COVID-19 positive patient who developed shortness of breath 3 days ago. EXAM: PORTABLE CHEST 1 VIEW COMPARISON:  PA and lateral chest 12/18/2019. FINDINGS: Bilateral airspace disease consistent with pneumonia has markedly worsened. Heart size is normal. No pneumothorax or pleural effusion. No acute or focal bony abnormality. IMPRESSION: Marked worsening bilateral pneumonia. Electronically Signed   By: Inge Rise M.D.   On: 12/21/2019 20:33   DG Chest Port 1 View  Result Date: 12/18/2019 CLINICAL DATA:  Hypotension, COVID-19 positive, shortness of breath and cough EXAM: PORTABLE CHEST 1 VIEW COMPARISON:  None. FINDINGS: Single frontal view of the chest demonstrates an unremarkable cardiac silhouette. There is mild diffuse interstitial prominence, with patchy bilateral ground-glass airspace disease consistent with multifocal pneumonia and diagnosis of COVID-19. No effusion or pneumothorax. No acute bony abnormalities. IMPRESSION: 1. Multifocal bilateral pneumonia consistent with COVID 19. Electronically Signed   By: Randa Ngo M.D.   On: 12/18/2019 17:20   ECHOCARDIOGRAM COMPLETE  Result Date: 12/19/2019    ECHOCARDIOGRAM REPORT   Patient Name:   CLORISSA GRUENBERG Date of Exam: 12/19/2019 Medical Rec #:  [id number]       Height:       65.0 in Accession #:    [id number]      Weight:       198.0 lb Date of Birth:  1971-04-25       BSA:          1.970 m Patient Age:    24 years        BP:           122/84 mmHg Patient Gender: F               HR:           83 bpm. Exam Location:  [facility] Procedure: 2D Echo Indications:    Syncope 780.2 / R55  History:         Patient has no prior history of Echocardiogram examinations.                 Risk Factors:Non-Smoker and Hypertension. Covid 19 positive.  Sonographer:    Leavy Cella RDCS (AE) Referring Phys: [place]  1. Left ventricular ejection fraction, by estimation, is 65 to 70%. The left ventricle has normal function. The left ventricle has no regional wall motion abnormalities. Left ventricular diastolic parameters were normal.  2. Right ventricular systolic function is normal. The right ventricular size is normal. There is normal pulmonary artery systolic pressure. The estimated right ventricular systolic pressure is 21.3 mmHg.  3. The mitral valve is grossly normal. Trivial mitral valve regurgitation.  4.  The aortic valve is tricuspid. Aortic valve regurgitation is not visualized.  5. The inferior vena cava is normal in size with greater than 50% respiratory variability, suggesting right atrial pressure of 3 mmHg. FINDINGS  Left Ventricle: Left ventricular ejection fraction, by estimation, is 65 to 70%. The left ventricle has normal function. The left ventricle has no regional wall motion abnormalities. The left ventricular internal cavity size was normal in size. There is  no left ventricular hypertrophy. Left ventricular diastolic parameters were normal. Right Ventricle: The right ventricular size is normal. No increase in right ventricular wall thickness. Right ventricular systolic function is normal. There is normal pulmonary artery systolic pressure. The tricuspid regurgitant velocity is 1.73 m/s, and  with an assumed right atrial pressure of 3 mmHg, the estimated right ventricular systolic pressure is 65.4 mmHg. Left Atrium: Left atrial size was normal in size. Right Atrium: Right atrial size was normal in size. Pericardium: There is no evidence of pericardial effusion. Mitral Valve: The mitral valve is grossly normal. Trivial mitral valve regurgitation. Tricuspid Valve: The  tricuspid valve is grossly normal. Tricuspid valve regurgitation is trivial. Aortic Valve: The aortic valve is tricuspid. Aortic valve regurgitation is not visualized. Mild aortic valve annular calcification. Pulmonic Valve: The pulmonic valve was grossly normal. Pulmonic valve regurgitation is trivial. Aorta: The aortic root is normal in size and structure. Venous: The inferior vena cava is normal in size with greater than 50% respiratory variability, suggesting right atrial pressure of 3 mmHg. IAS/Shunts: No atrial level shunt detected by color flow Doppler.  LEFT VENTRICLE PLAX 2D LVIDd:         4.77 cm  Diastology LVIDs:         2.80 cm  LV e' lateral:   10.00 cm/s LV PW:         1.19 cm  LV E/e' lateral: 7.8 LV IVS:        1.02 cm  LV e' medial:    8.38 cm/s LVOT diam:     2.00 cm  LV E/e' medial:  9.3 LVOT Area:     3.14 cm  RIGHT VENTRICLE RV S prime:     11.60 cm/s TAPSE (M-mode): 2.2 cm LEFT ATRIUM             Index       RIGHT ATRIUM           Index LA diam:        3.70 cm 1.88 cm/m  RA Area:     10.40 cm LA Vol (A2C):   43.0 ml 21.83 ml/m RA Volume:   21.60 ml  10.97 ml/m LA Vol (A4C):   40.8 ml 20.71 ml/m LA Biplane Vol: 44.0 ml 22.34 ml/m   AORTA Ao Root diam: 3.10 cm MITRAL VALVE               TRICUSPID VALVE MV Area (PHT): 3.28 cm    TR Peak grad:   12.0 mmHg MV Decel Time: 231 msec    TR Vmax:        173.00 cm/s MV E velocity: 78.00 cm/s MV A velocity: 56.10 cm/s  SHUNTS MV E/A ratio:  1.39        Systemic Diam: 2.00 cm Rozann Lesches MD Electronically signed by Rozann Lesches MD Signature Date/Time: 12/19/2019/10:26:56 AM    Final

## 2019-12-25 ENCOUNTER — Inpatient Hospital Stay (HOSPITAL_COMMUNITY): Payer: BC Managed Care – PPO

## 2019-12-25 DIAGNOSIS — U071 COVID-19: Secondary | ICD-10-CM

## 2019-12-25 DIAGNOSIS — R7989 Other specified abnormal findings of blood chemistry: Secondary | ICD-10-CM

## 2019-12-25 LAB — CBC WITH DIFFERENTIAL/PLATELET
Abs Immature Granulocytes: 0.35 10*3/uL — ABNORMAL HIGH (ref 0.00–0.07)
Basophils Absolute: 0 10*3/uL (ref 0.0–0.1)
Basophils Relative: 0 %
Eosinophils Absolute: 0 10*3/uL (ref 0.0–0.5)
Eosinophils Relative: 0 %
HCT: 36.9 % (ref 36.0–46.0)
Hemoglobin: 12.4 g/dL (ref 12.0–15.0)
Immature Granulocytes: 5 %
Lymphocytes Relative: 6 %
Lymphs Abs: 0.4 10*3/uL — ABNORMAL LOW (ref 0.7–4.0)
MCH: 29.7 pg (ref 26.0–34.0)
MCHC: 33.6 g/dL (ref 30.0–36.0)
MCV: 88.5 fL (ref 80.0–100.0)
Monocytes Absolute: 0.3 10*3/uL (ref 0.1–1.0)
Monocytes Relative: 4 %
Neutro Abs: 5.9 10*3/uL (ref 1.7–7.7)
Neutrophils Relative %: 85 %
Platelets: 411 10*3/uL — ABNORMAL HIGH (ref 150–400)
RBC: 4.17 MIL/uL (ref 3.87–5.11)
RDW: 12.7 % (ref 11.5–15.5)
WBC: 7 10*3/uL (ref 4.0–10.5)
nRBC: 0 % (ref 0.0–0.2)

## 2019-12-25 LAB — COMPREHENSIVE METABOLIC PANEL
ALT: 69 U/L — ABNORMAL HIGH (ref 0–44)
AST: 33 U/L (ref 15–41)
Albumin: 2.6 g/dL — ABNORMAL LOW (ref 3.5–5.0)
Alkaline Phosphatase: 80 U/L (ref 38–126)
Anion gap: 9 (ref 5–15)
BUN: 19 mg/dL (ref 6–20)
CO2: 22 mmol/L (ref 22–32)
Calcium: 7.9 mg/dL — ABNORMAL LOW (ref 8.9–10.3)
Chloride: 108 mmol/L (ref 98–111)
Creatinine, Ser: 0.65 mg/dL (ref 0.44–1.00)
GFR calc Af Amer: >60 mL/min (ref >60)
GFR calc non Af Amer: >60 mL/min (ref >60)
Glucose, Bld: 113 mg/dL — ABNORMAL HIGH (ref 70–99)
Potassium: 4 mmol/L (ref 3.5–5.1)
Sodium: 139 mmol/L (ref 135–145)
Total Bilirubin: 1.3 mg/dL — ABNORMAL HIGH (ref 0.3–1.2)
Total Protein: 5.7 g/dL — ABNORMAL LOW (ref 6.5–8.1)

## 2019-12-25 LAB — C-REACTIVE PROTEIN: CRP: 2.1 mg/dL — ABNORMAL HIGH (ref <1.0)

## 2019-12-25 LAB — D-DIMER, QUANTITATIVE: D-Dimer, Quant: 3.07 ug/mL-FEU — ABNORMAL HIGH (ref 0.00–0.50)

## 2019-12-25 LAB — PROCALCITONIN: Procalcitonin: <0.1 ng/mL

## 2019-12-25 LAB — MAGNESIUM: Magnesium: 2.2 mg/dL (ref 1.7–2.4)

## 2019-12-25 LAB — BRAIN NATRIURETIC PEPTIDE: B Natriuretic Peptide: 103.5 pg/mL — ABNORMAL HIGH (ref 0.0–100.0)

## 2019-12-25 NOTE — Progress Notes (Signed)
ANTICOAGULATION CONSULT NOTE -follow up Pharmacy Consult for Lovenox Indication: VTE treatment (rule out)  Allergies  Allergen Reactions  . Fentanyl Other (See Comments)    Doesn't like the way it feels     Patient Measurements: Height: 5\' 5"  (165.1 cm) Weight: 91.9 kg (202 lb 9.6 oz) IBW/kg (Calculated) : 57  Vital Signs: Temp: 98.1 F (36.7 C) (08/24 0738) Temp Source: Oral (08/24 0738) BP: 112/80 (08/24 0738) Pulse Rate: 52 (08/24 0738)  Labs: Recent Labs    12/23/19 0836 12/23/19 0836 12/24/19 0401 12/25/19 0748  HGB 12.7   < > 11.4* 12.4  HCT 37.4  --  33.9* 36.9  PLT 345  --  357 411*  CREATININE 0.77  --  0.73 0.65   < > = values in this interval not displayed.    Estimated Creatinine Clearance: 96.4 mL/min (by C-G formula based on SCr of 0.65 mg/dL).   Medical History: Past Medical History:  Diagnosis Date  . Anxiety   . Depression   . Fibromyalgia   . GERD (gastroesophageal reflux disease)   . Headache(784.0)   . Heart murmur   . Hyperplastic colon polyp 10/28/2019   Dr Sydell Axon recommends repeat colonoscopy in 2028.  Marland Kitchen IFG (impaired fasting glucose)   . Obsessive-compulsive disorder   . Osteoarthritis   . Seizures ([place])    last seizure at age 56-stressed induced seizure.    Medications:  See med rec  Assessment: 49 y.o. female, with history of seizures, osteoarthritis, obsessive-compulsive disorder, heart murmur, GERD, fibromyalgia, depression, anxiety presents to the ED at the recommendation of her PCP. She was diagnosed with COVID 12/11/19.  In ED 12/18/19,  and refused remdesivir. She is experiencing SOB, dry cough, fevers, body aches, N/V and some palpitations. Recently started on decadron but now requiring 35 L high flow [place] to maintain saturation at 91 %. Pharmacy consulted on 12/22/19 to start full dose lovenox for VTE due to elevated d-dimer. To continue lovenox  until D-dimer < 2.    D-dimer trend last 3 days:  8.6>5.82>down to 3.07  today. H/H 12.4/36.9, stable, pltc within normal , up to 411 today.  Scr <1 stable. No bleeding reported.   Goal of Therapy:  Monitor platelets by anticoagulation protocol: Yes   Plan:  Continue Lovenox 1mg /kg sq q12h (dose is 90mg ) Monitor for s/s bleeding    Nicole Cella, RPh Clinical Pharmacist [phone] Please check AMION for all [place] phone numbers After 10:00 PM, call [place] [phone] 12/25/2019,10:55 AM

## 2019-12-25 NOTE — Progress Notes (Signed)
PROGRESS NOTE                                                                                                                                                                                                             Patient Demographics:    Theresa [name], is a 49 y.o. female, DOB - [birth year], SKA:[id number]  Outpatient Primary MD for the patient is Kathyrn Drown, MD    LOS - 4  Admit date - 12/21/2019    Chief Complaint  Patient presents with  . Shortness of Breath       Brief Narrative - Theresa [name]  is a 49 y.o. female, with history of seizures, osteoarthritis, obsessive-compulsive disorder, heart murmur, GERD, fibromyalgia, depression, anxiety presents to the ED at the recommendation of her PCP.  Patient reports that she had contacted her PCP regarding oxygen sats in the low 70s high 60s at home.  PCP told her that she was at risk of death if she did not return to the ER, she arrived to the [facility], ER and was diagnosed with Severe Hypoxic Resp. Failure and was admitted to the hospital.  She is not vaccinated.   Subjective:   Patient in bed, appears comfortable, denies any headache, no fever, no chest pain or pressure, no shortness of breath , no abdominal pain. No focal weakness.   Assessment  & Plan :     1. Acute Hypoxic Resp. Failure due to Acute Covid 19 Viral Pneumonitis during the ongoing 2020 Covid 19 Pandemic - she has severe parenchymal injury with extreme hypoxia, on high-dose IV steroids, Solu-Medrol, she received Actemra at the previous hospital while she was in the ER.  She was initially on 15 L heated high flow with nonrebreather mask sequently went up to 45 L of heated high flow with nonrebreather, now she is responding quite well to the treatment and down to 20 L of heated high flow nonrebreather mask off, will monitor closely, Niccoli improving.  Encouraged the patient to sit up in chair in the  daytime use I-S and flutter valve for pulmonary toiletry and then prone in bed when at night.  Will advance activity and titrate down oxygen as possible.   SpO2: 92 % O2 Flow Rate (L/min): 20 L/min FiO2 (%): 60 %  Recent Labs  Lab 12/18/19 1437 12/18/19 1801  12/18/19 1812 12/19/19 0537 12/19/19 0537 12/21/19 1930 12/21/19 1958 12/21/19 2010 12/22/19 0529 12/23/19 0836 12/24/19 0401 12/25/19 0748  WBC   < >  --   --  1.7*   < >  --  7.3  --  6.0 7.4 7.2 7.0  PLT   < >  --   --  129*   < >  --  243  --  218 345 357 411*  CRP   < > 7.9*  --  6.6*  --  10.7*  --   --   --  3.3* 1.4* 2.1*  AST   < >  --   --  50*   < >  --  90*  --  61* 53* 37 33  ALT   < >  --   --  41   < >  --  86*  --  76* 85* 76* 69*  ALKPHOS   < >  --   --  72   < >  --  86  --  74 86 79 80  BILITOT   < >  --   --  0.6   < >  --  0.7  --  0.5 1.1 0.7 1.3*  ALBUMIN   < >  --   --  2.9*   < >  --  3.0*  --  2.6* 2.6* 2.4* 2.6*  DDIMER   < > 0.92*  --  0.92*   < >  --  1.70*  --  6.15* 8.60* 5.82* 3.07*  PROCALCITON  --  <0.10  --   --   --   --  <0.10  --   --  <0.10 <0.10 <0.10  LATICACIDVEN  --  1.1  --   --   --   --   --  1.2  --   --   --   --   SARSCOV2NAA  --   --  POSITIVE*  --   --   --   --   --   --   --   --   --    < > = values in this interval not displayed.     2.  COVID-19 pneumonia related asymptomatic transaminitis.  Will trend and monitor.  3.  History of migraine headaches.  Imitrex as needed ordered.  4. Obesity - BMI 33, follow with PCP for weight loss.  5.  Extremely high D-dimer could be due to inflammation, on high-dose Lovenox and D-dimer trending down, continue high dose till D-dimer drops below 2, will also check leg ultrasound.    Condition - Extremely Guarded  Family Communication  : Loistine Chance [phone]   on 12/23/2019 at 11:01 AM updated after call back, 12/24/19 at 8:15 AM.     Code Status :  Full  Consults  :  None  Procedures  :    TTE - . Left ventricular  ejection fraction, by estimation, is 65 to 70%. The left ventricle has normal function. The left ventricle has no regional wall motion abnormalities. Left ventricular diastolic parameters were normal.  2. Right ventricular systolic function is normal. The right ventricular size is normal. There is normal pulmonary artery systolic pressure. The estimated right ventricular systolic pressure is 56.3 mmHg.  3. The mitral valve is grossly normal. Trivial mitral valve regurgitation.  4. The aortic valve is tricuspid. Aortic valve regurgitation is not visualized.  5. The inferior vena cava is normal in  size with greater than 50% respiratory variability, suggesting right atrial pressure of 3 mmHg.  PUD Prophylaxis : PPI  Disposition Plan  :    Status is: Inpatient  Remains inpatient appropriate because:IV treatments appropriate due to intensity of illness or inability to take PO   Dispo: The patient is from: Home              Anticipated d/c is to: Home              Anticipated d/c date is: > 3 days              Patient currently is not medically stable to d/c.  DVT Prophylaxis  :  Lovenox   Lab Results  Component Value Date   PLT 411 (H) 12/25/2019    Diet :  Diet Order            Diet Heart Room service appropriate? Yes; Fluid consistency: Thin  Diet effective now                  Inpatient Medications  Scheduled Meds: . vitamin C  500 mg Oral Daily  . enoxaparin (LOVENOX) injection  1 mg/kg Subcutaneous Q12H  . methylPREDNISolone (SOLU-MEDROL) injection  60 mg Intravenous Q12H  . pantoprazole  40 mg Oral Daily  . valACYclovir  500 mg Oral Daily  . zinc sulfate  220 mg Oral Daily   Continuous Infusions: . tocilizumab (ACTEMRA) - non-COVID treatment     PRN Meds:.acetaminophen, albuterol, diazepam, guaiFENesin-dextromethorphan, Ipratropium-Albuterol, [DISCONTINUED] ondansetron **OR** ondansetron (ZOFRAN) IV, polyethylene glycol, SUMAtriptan  Antibiotics  :     Anti-infectives (From admission, onward)   Start     Dose/Rate Route Frequency Ordered Stop   12/23/19 1000  valACYclovir (VALTREX) tablet 500 mg        500 mg Oral Daily 12/23/19 0800     12/22/19 1000  remdesivir 100 mg in sodium chloride 0.9 % 100 mL IVPB        100 mg 200 mL/hr over 30 Minutes Intravenous Daily 12/21/19 2040 12/25/19 0943   12/21/19 2100  remdesivir 100 mg in sodium chloride 0.9 % 100 mL IVPB        100 mg 200 mL/hr over 30 Minutes Intravenous Every 30 min 12/21/19 2039 12/21/19 2230       Time Spent in minutes  30   Lala Lund M.D on 12/25/2019 at 11:31 AM  To page go to [email or web address] - password [facility]  [facility] -  Office  [phone]    See all Orders from today for further details    Objective:   Vitals:   12/25/19 0000 12/25/19 0400 12/25/19 0738 12/25/19 0817  BP: 110/63 114/69 112/80   Pulse: 63 (!) 57 (!) 52   Resp: (!) 24 16 14    Temp: 97.9 F (36.6 C) 98 F (36.7 C) 98.1 F (36.7 C)   TempSrc: Oral Oral Oral   SpO2: 99% 98%  92%  Weight:      Height:        Wt Readings from Last 3 Encounters:  12/22/19 91.9 kg  12/18/19 89.8 kg  11/15/19 94.7 kg     Intake/Output Summary (Last 24 hours) at 12/25/2019 1131 Last data filed at 12/24/2019 1847 Gross per 24 hour  Intake 200 ml  Output -  Net 200 ml     Physical Exam  Awake Alert, No new F.N deficits, Normal affect [place].AT,PERRAL Supple Neck,No JVD, No cervical lymphadenopathy appriciated.  Symmetrical  Chest wall movement, Good air movement bilaterally, few rales RRR,No Gallops, Rubs or new Murmurs, No Parasternal Heave +ve B.Sounds, Abd Soft, No tenderness, No organomegaly appriciated, No rebound - guarding or rigidity. No Cyanosis, Clubbing or edema, No new Rash or bruise     Data Review:    CBC Recent Labs  Lab 12/19/19 0537 12/19/19 0537 12/21/19 1958 12/22/19 0529 12/23/19 0836 12/24/19 0401 12/25/19 0748  WBC 1.7*   < > 7.3 6.0 7.4 7.2 7.0  HGB  11.7*   < > 12.7 11.6* 12.7 11.4* 12.4  HCT 34.7*   < > 37.3 34.5* 37.4 33.9* 36.9  PLT 129*   < > 243 218 345 357 411*  MCV 89.7   < > 90.1 90.8 90.8 88.3 88.5  MCH 30.2   < > 30.7 30.5 30.8 29.7 29.7  MCHC 33.7   < > 34.0 33.6 34.0 33.6 33.6  RDW 12.8   < > 13.2 13.2 13.0 13.0 12.7  LYMPHSABS 0.1*  --  0.3* 0.4*  --  0.3* 0.4*  MONOABS 0.1  --  0.3 0.1  --  0.3 0.3  EOSABS 0.0  --  0.0 0.0  --  0.0 0.0  BASOSABS 0.0  --  0.0 0.0  --  0.0 0.0   < > = values in this interval not displayed.    Recent Labs  Lab 12/18/19 1437 12/18/19 1801 12/19/19 0537 12/19/19 0537 12/21/19 1930 12/21/19 1958 12/21/19 2010 12/22/19 0529 12/23/19 0836 12/24/19 0401 12/25/19 0748  NA   < >  --  136   < >  --  139  --  140 141 140 139  K   < >  --  3.5   < >  --  3.5  --  3.7 4.1 3.8 4.0  CL   < >  --  108   < >  --  107  --  111 108 109 108  CO2   < >  --  19*   < >  --  22  --  21* 22 23 22   GLUCOSE   < >  --  222*   < >  --  136*  --  152* 137* 153* 113*  BUN   < >  --  11   < >  --  12  --  15 22* 20 19  CREATININE   < >  --  0.68   < >  --  0.62  --  0.64 0.77 0.73 0.65  CALCIUM   < >  --  7.5*   < >  --  8.1*  --  7.7* 8.0* 8.0* 7.9*  AST   < >  --  50*   < >  --  90*  --  61* 53* 37 33  ALT   < >  --  41   < >  --  86*  --  76* 85* 76* 69*  ALKPHOS   < >  --  72   < >  --  86  --  74 86 79 80  BILITOT   < >  --  0.6   < >  --  0.7  --  0.5 1.1 0.7 1.3*  ALBUMIN   < >  --  2.9*   < >  --  3.0*  --  2.6* 2.6* 2.4* 2.6*  MG  --   --  1.8  --   --   --   --  2.3 2.4 2.3 2.2  CRP   < > 7.9* 6.6*  --  10.7*  --   --   --  3.3* 1.4* 2.1*  DDIMER   < > 0.92* 0.92*   < >  --  1.70*  --  6.15* 8.60* 5.82* 3.07*  PROCALCITON  --  <0.10  --   --   --  <0.10  --   --  <0.10 <0.10 <0.10  LATICACIDVEN  --  1.1  --   --   --   --  1.2  --   --   --   --   HGBA1C  --   --  5.8*  --   --   --   --   --   --   --   --   BNP  --   --   --   --   --   --   --   --  260.5* 140.6* 103.5*   < > = values in  this interval not displayed.    ------------------------------------------------------------------------------------------------------------------ No results for input(s): CHOL, HDL, LDLCALC, TRIG, CHOLHDL, LDLDIRECT in the last 72 hours.  Lab Results  Component Value Date   HGBA1C 5.8 (H) 12/19/2019   ------------------------------------------------------------------------------------------------------------------ No results for input(s): TSH, T4TOTAL, T3FREE, THYROIDAB in the last 72 hours.  Invalid input(s): FREET3  Cardiac Enzymes No results for input(s): CKMB, TROPONINI, MYOGLOBIN in the last 168 hours.  Invalid input(s): CK ------------------------------------------------------------------------------------------------------------------    Component Value Date/Time   BNP 103.5 (H) 12/25/2019 6160    Micro Results Recent Results (from the past 240 hour(s))  Blood Culture (routine x 2)     Status: None   Collection Time: 12/18/19  2:40 PM   Specimen: Left Antecubital; Blood  Result Value Ref Range Status   Specimen Description LEFT ANTECUBITAL  Final   Special Requests   Final    BOTTLES DRAWN AEROBIC ONLY Blood Culture adequate volume   Culture   Final    NO GROWTH 5 DAYS Performed at [facility], [address] [place], [place] [address]    Report Status 12/23/2019 FINAL  Final  Blood Culture (routine x 2)     Status: None   Collection Time: 12/18/19  6:01 PM   Specimen: Left Antecubital; Blood  Result Value Ref Range Status   Specimen Description LEFT ANTECUBITAL  Final   Special Requests   Final    BOTTLES DRAWN AEROBIC AND ANAEROBIC Blood Culture adequate volume   Culture   Final    NO GROWTH 5 DAYS Performed at [facility], [address], [place], [place] [address]    Report Status 12/23/2019 FINAL  Final  SARS Coronavirus 2 by RT PCR (hospital order, performed in [place] hospital lab) Nasopharyngeal Nasopharyngeal Swab     Status: Abnormal    Collection Time: 12/18/19  6:12 PM   Specimen: Nasopharyngeal Swab  Result Value Ref Range Status   SARS Coronavirus 2 POSITIVE (A) NEGATIVE Final    Comment: RESULT CALLED TO, READ BACK BY AND VERIFIED WITH: S WOODS,RN @1956  12/18/19 MKELLY (NOTE) SARS-CoV-2 target nucleic acids are DETECTED  SARS-CoV-2 RNA is generally detectable in upper respiratory specimens  during the acute phase of infection.  Positive results are indicative  of the presence of the identified virus, but do not rule out bacterial infection or co-infection with other pathogens not detected by the test.  Clinical correlation with patient history and  other diagnostic information is necessary to  determine patient infection status.  The expected result is negative.  Fact Sheet for Patients:   StrictlyIdeas.no   Fact Sheet for Healthcare Providers:   BankingDealers.co.za    This test is not yet approved or cleared by the [place] FDA and  has been authorized for detection and/or diagnosis of SARS-CoV-2 by FDA under an Emergency Use Authorization (EUA).  This EUA will remain in effect (meaning this test  can be used) for the duration of  the COVID-19 declaration under Section 564(b)(1) of the Act, 21 U.S.C. section 360-bbb-3(b)(1), unless the authorization is terminated or revoked sooner.  Performed at [facility], [address], [place], [place] [address]   Blood Culture (routine x 2)     Status: None (Preliminary result)   Collection Time: 12/21/19  8:10 PM   Specimen: Right Antecubital; Blood  Result Value Ref Range Status   Specimen Description RIGHT ANTECUBITAL  Final   Special Requests   Final    BOTTLES DRAWN AEROBIC AND ANAEROBIC Blood Culture adequate volume   Culture   Final    NO GROWTH 2 DAYS Performed at [facility], [address] [place], [place] [address]    Report Status PENDING  Incomplete  Blood Culture (routine x 2)     Status: None  (Preliminary result)   Collection Time: 12/21/19  8:15 PM   Specimen: Left Antecubital; Blood  Result Value Ref Range Status   Specimen Description LEFT ANTECUBITAL  Final   Special Requests   Final    BOTTLES DRAWN AEROBIC AND ANAEROBIC Blood Culture adequate volume   Culture   Final    NO GROWTH 2 DAYS Performed at [facility], [address], [place], [place] [address]    Report Status PENDING  Incomplete    Radiology Reports DG Chest Port 1 View  Result Date: 12/24/2019 CLINICAL DATA:  Shortness of breath, cough, and intermittent oxygen desaturation. COVID-19 positive. EXAM: PORTABLE CHEST 1 VIEW COMPARISON:  12/23/2019 FINDINGS: The cardiomediastinal silhouette is unchanged. Lung volumes are unchanged with persistent mild elevation of the right hemidiaphragm. Interstitial and ill-defined airspace opacities in the left greater than right lungs have not significantly changed. No sizable pleural effusion or pneumothorax is identified. IMPRESSION: Unchanged bilateral lung opacities consistent with known COVID-19 pneumonia. Electronically Signed   By: Logan Bores M.D.   On: 12/24/2019 09:59   DG Chest Port 1 View  Result Date: 12/23/2019 CLINICAL DATA:  COVID-19. EXAM: PORTABLE CHEST 1 VIEW COMPARISON:  December 21, 2019 FINDINGS: Bilateral pulmonary infiltrates are less patchy more diffuse in the interval. The cardiomediastinal silhouette is stable. No pneumothorax. IMPRESSION: The patient's known COVID-19 pneumonia is less patchy and more diffuse in the interval, particularly on the left. No other changes. Electronically Signed   By: Dorise Bullion III M.D   On: 12/23/2019 08:39   DG Chest Port 1 View  Result Date: 12/21/2019 CLINICAL DATA:  COVID-19 positive patient who developed shortness of breath 3 days ago. EXAM: PORTABLE CHEST 1 VIEW COMPARISON:  PA and lateral chest 12/18/2019. FINDINGS: Bilateral airspace disease consistent with pneumonia has markedly worsened. Heart size is  normal. No pneumothorax or pleural effusion. No acute or focal bony abnormality. IMPRESSION: Marked worsening bilateral pneumonia. Electronically Signed   By: Inge Rise M.D.   On: 12/21/2019 20:33   DG Chest Port 1 View  Result Date: 12/18/2019 CLINICAL DATA:  Hypotension, COVID-19 positive, shortness of breath and cough EXAM: PORTABLE CHEST 1 VIEW COMPARISON:  None. FINDINGS: Single frontal  view of the chest demonstrates an unremarkable cardiac silhouette. There is mild diffuse interstitial prominence, with patchy bilateral ground-glass airspace disease consistent with multifocal pneumonia and diagnosis of COVID-19. No effusion or pneumothorax. No acute bony abnormalities. IMPRESSION: 1. Multifocal bilateral pneumonia consistent with COVID 19. Electronically Signed   By: Randa Ngo M.D.   On: 12/18/2019 17:20   ECHOCARDIOGRAM COMPLETE  Result Date: 12/19/2019    ECHOCARDIOGRAM REPORT   [name] Date of Exam: 12/19/2019 Medical Rec #:  [id number]       Height:       65.0 in Accession #:    [id number]      Weight:       198.0 lb Date of Birth:  03-06-1971       BSA:          1.970 m Patient Age:    46 years        BP:           122/84 mmHg Patient Gender: F               HR:           83 bpm. Exam Location:  [facility] Procedure: 2D Echo Indications:    Syncope 780.2 / R55  History:        Patient has no prior history of Echocardiogram examinations.                 Risk Factors:Non-Smoker and Hypertension. Covid 19 positive.  Sonographer:    Leavy Cella RDCS (AE) Referring Phys: [place]  1. Left ventricular ejection fraction, by estimation, is 65 to 70%. The left ventricle has normal function. The left ventricle has no regional wall motion abnormalities. Left ventricular diastolic parameters were normal.  2. Right ventricular systolic function is normal. The right ventricular size is normal. There is normal pulmonary artery systolic pressure. The  estimated right ventricular systolic pressure is 82.9 mmHg.  3. The mitral valve is grossly normal. Trivial mitral valve regurgitation.  4. The aortic valve is tricuspid. Aortic valve regurgitation is not visualized.  5. The inferior vena cava is normal in size with greater than 50% respiratory variability, suggesting right atrial pressure of 3 mmHg. FINDINGS  Left Ventricle: Left ventricular ejection fraction, by estimation, is 65 to 70%. The left ventricle has normal function. The left ventricle has no regional wall motion abnormalities. The left ventricular internal cavity size was normal in size. There is  no left ventricular hypertrophy. Left ventricular diastolic parameters were normal. Right Ventricle: The right ventricular size is normal. No increase in right ventricular wall thickness. Right ventricular systolic function is normal. There is normal pulmonary artery systolic pressure. The tricuspid regurgitant velocity is 1.73 m/s, and  with an assumed right atrial pressure of 3 mmHg, the estimated right ventricular systolic pressure is 56.2 mmHg. Left Atrium: Left atrial size was normal in size. Right Atrium: Right atrial size was normal in size. Pericardium: There is no evidence of pericardial effusion. Mitral Valve: The mitral valve is grossly normal. Trivial mitral valve regurgitation. Tricuspid Valve: The tricuspid valve is grossly normal. Tricuspid valve regurgitation is trivial. Aortic Valve: The aortic valve is tricuspid. Aortic valve regurgitation is not visualized. Mild aortic valve annular calcification. Pulmonic Valve: The pulmonic valve was grossly normal. Pulmonic valve regurgitation is trivial. Aorta: The aortic root is normal in size and structure. Venous: The inferior vena cava is normal in size with greater  than 50% respiratory variability, suggesting right atrial pressure of 3 mmHg. IAS/Shunts: No atrial level shunt detected by color flow Doppler.  LEFT VENTRICLE PLAX 2D LVIDd:         4.77  cm  Diastology LVIDs:         2.80 cm  LV e' lateral:   10.00 cm/s LV PW:         1.19 cm  LV E/e' lateral: 7.8 LV IVS:        1.02 cm  LV e' medial:    8.38 cm/s LVOT diam:     2.00 cm  LV E/e' medial:  9.3 LVOT Area:     3.14 cm  RIGHT VENTRICLE RV S prime:     11.60 cm/s TAPSE (M-mode): 2.2 cm LEFT ATRIUM             Index       RIGHT ATRIUM           Index LA diam:        3.70 cm 1.88 cm/m  RA Area:     10.40 cm LA Vol (A2C):   43.0 ml 21.83 ml/m RA Volume:   21.60 ml  10.97 ml/m LA Vol (A4C):   40.8 ml 20.71 ml/m LA Biplane Vol: 44.0 ml 22.34 ml/m   AORTA Ao Root diam: 3.10 cm MITRAL VALVE               TRICUSPID VALVE MV Area (PHT): 3.28 cm    TR Peak grad:   12.0 mmHg MV Decel Time: 231 msec    TR Vmax:        173.00 cm/s MV E velocity: 78.00 cm/s MV A velocity: 56.10 cm/s  SHUNTS MV E/A ratio:  1.39        Systemic Diam: 2.00 cm Rozann Lesches MD Electronically signed by Rozann Lesches MD Signature Date/Time: 12/19/2019/10:26:56 AM    Final

## 2019-12-25 NOTE — Progress Notes (Signed)
Occupational Therapy Evaluation Patient Details Name: Theresa [name] MRN: [id number] DOB: 1971/04/14 Today's Date: 12/25/2019    History of Present Illness 49 y.o. female, with history of seizures, osteoarthritis, obsessive-compulsive disorder, heart murmur, GERD, fibromyalgia, depression, anxiety presented to the ED 12/21/19 with severe hypoxia. 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 Pt seen on 20L [facility] 6% FiO2. Educated pt on the importance of mobility and participation with ADL in her recovery process. Pt then agreeable to mobilize OOB to chair. Began education on level 1 theraband and seated HEP. Completed flutter valve and incentive spirometer - able to pull 500 ml. Encouraged pt to spend increased time OOB in chair and to prone as able when she is in bed. VSS during session with SpO2 in mid 90s during session. Will follow acutely but do not anticipate need for OT follow up after DC. Pt states she will have good support to assist as needed after DC. Pt appreciative.     Follow Up Recommendations  No OT follow up;Supervision - Intermittent    Equipment Recommendations  3 in 1 bedside commode    Recommendations for Other Services       Precautions / Restrictions Precautions Precaution Comments: watch O2; anxiety      Mobility Bed Mobility Overal bed mobility: Modified Independent                Transfers Overall transfer level: Needs assistance   Transfers: Sit to/from Stand;Stand Pivot Transfers Sit to Stand: Min assist Stand pivot transfers: Min assist       General transfer comment: initially unsteady "my equilibrium is off"    Balance Overall balance assessment: Needs assistance   Sitting balance-Leahy Scale: Good       Standing balance-Leahy Scale: Fair                             ADL either performed or assessed with  clinical judgement   ADL Overall ADL's : Needs assistance/impaired     Grooming: Minimal assistance;Sitting Grooming Details (indicate cue type and reason): Difficulty maintaining arms elevated to comb hair Upper Body Bathing: Set up;Sitting   Lower Body Bathing: Minimal assistance;Sit to/from stand   Upper Body Dressing : Set up;Sitting   Lower Body Dressing: Minimal assistance;Sit to/from stand   Toilet Transfer: Min guard;Stand-pivot;BSC   Toileting- Water quality scientist and Hygiene: Supervision/safety;Set up       Functional mobility during ADLs: Minimal assistance General ADL Comments: SOB wtih activity; began education on energy conservation     Vision   Additional Comments: reading glasses     Perception     Praxis      Pertinent Vitals/Pain Pain Assessment: 0-10 Pain Score: 2  Pain Location: knees Pain Descriptors / Indicators: Aching Pain Intervention(s): Limited activity within patient's tolerance     Hand Dominance Right   Extremity/Trunk Assessment Upper Extremity Assessment Upper Extremity Assessment: Generalized weakness   Lower Extremity Assessment Lower Extremity Assessment: Defer to PT evaluation   Cervical / Trunk Assessment Cervical / Trunk Assessment: Normal   Communication Communication Communication: Other (comment)   Cognition Arousal/Alertness: Awake/alert Behavior During Therapy: Flat affect Overall Cognitive Status: Impaired/Different from baseline Area of Impairment: Attention;Memory;Problem solving                   Current Attention  Level: Selective         Problem Solving: Slow processing General Comments: very slow processing   General Comments       Exercises Other Exercises Other Exercises: incentive spirometer x 5 flutter valve x 5; pulls 572ml Other Exercises: pursed lip breathing Other Exercises: began education on level 1 theraband/seated HEP - written information provided   Shoulder  Instructions      Home Living Family/patient expects to be discharged to:: Private residence Living Arrangements: Children Available Help at Discharge: Family;Available PRN/intermittently Type of Home: House Home Access: Stairs to enter [employer] of Steps: 5 Entrance Stairs-Rails: Right Home Layout: One level     Bathroom Shower/Tub: Tub/shower unit;Curtain   Bathroom Toilet: Handicapped height Bathroom Accessibility: No   Home Equipment: Environmental consultant - 2 wheels;Cane - single point          Prior Functioning/Environment Level of Independence: Independent        Comments: 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; drives; independent with all IADL taks; enjoys spenig time with kids 59;24 yo        OT Problem List: Decreased strength;Decreased activity tolerance;Impaired balance (sitting and/or standing);Decreased safety awareness;Decreased knowledge of use of DME or AE;Cardiopulmonary status limiting activity;Obesity;Pain      OT Treatment/Interventions: Self-care/ADL training;Therapeutic exercise;Energy conservation;DME and/or AE instruction;Therapeutic activities;Cognitive remediation/compensation;Balance training;Patient/family education    OT Goals(Current goals can be found in the care plan section) Acute Rehab OT Goals Patient Stated Goal: get well and go home to her kids OT Goal Formulation: With patient Time For Goal Achievement: 01/08/20 Potential to Achieve Goals: Good  OT Frequency: Min 2X/week   Barriers to D/C:            Co-evaluation              AM-PAC OT "6 Clicks" Daily Activity     Outcome Measure Help from another person eating meals?: None Help from another person taking care of personal grooming?: A Little Help from another person toileting, which includes using toliet, bedpan, or urinal?: A Little Help from another person bathing (including washing, rinsing, drying)?: A Little Help from another person to put on and taking off  regular upper body clothing?: A Little Help from another person to put on and taking off regular lower body clothing?: A Little 6 Click Score: 19   End of Session Equipment Utilized During Treatment: Oxygen (20L 60%) Nurse Communication: Mobility status  Activity Tolerance: Patient tolerated treatment well Patient left: in chair;with call bell/phone within reach  OT Visit Diagnosis: Unsteadiness on feet (R26.81);Muscle weakness (generalized) (M62.81);Other symptoms and signs involving cognitive function;Pain Pain - Right/Left:  (B) Pain - part of body: Knee                Time: 8250-0370 OT Time Calculation (min): 50 min Charges:  OT General Charges $OT Visit: 1 Visit OT Evaluation $OT Eval Moderate Complexity: 1 Mod OT Treatments $Self Care/Home Management : 8-22 mins $Therapeutic Exercise: 8-22 mins  Maurie Boettcher, OT/L   Acute OT Clinical Specialist [place] Pager [phone] Office [phone]   [facility] 12/25/2019, 3:09 PM

## 2019-12-25 NOTE — Progress Notes (Signed)
Bilateral lower extremity venous duplex complete.  Please see CV proc tab for preliminary results. Lita Mains- RDMS, RVT 12:28 PM  12/25/2019

## 2019-12-25 NOTE — Plan of Care (Signed)
  Problem: Education: ?Goal: Knowledge of General Education information will improve ?Description: Including pain rating scale, medication(s)/side effects and non-pharmacologic comfort measures ?Outcome: Progressing ?  ?Problem: Health Behavior/Discharge Planning: ?Goal: Ability to manage health-related needs will improve ?Outcome: Progressing ?  ?Problem: Clinical Measurements: ?Goal: Ability to maintain clinical measurements within normal limits will improve ?Outcome: Progressing ?Goal: Will remain free from infection ?Outcome: Progressing ?Goal: Diagnostic test results will improve ?Outcome: Progressing ?Goal: Respiratory complications will improve ?Outcome: Progressing ?Goal: Cardiovascular complication will be avoided ?Outcome: Progressing ?  ?Problem: Activity: ?Goal: Risk for activity intolerance will decrease ?Outcome: Progressing ?  ?Problem: Nutrition: ?Goal: Adequate nutrition will be maintained ?Outcome: Progressing ?  ?Problem: Coping: ?Goal: Level of anxiety will decrease ?Outcome: Progressing ?  ?Problem: Elimination: ?Goal: Will not experience complications related to bowel motility ?Outcome: Progressing ?Goal: Will not experience complications related to urinary retention ?Outcome: Progressing ?  ?Problem: Pain Managment: ?Goal: General experience of comfort will improve ?Outcome: Progressing ?  ?Problem: Safety: ?Goal: Ability to remain free from injury will improve ?Outcome: Progressing ?  ?Problem: Skin Integrity: ?Goal: Risk for impaired skin integrity will decrease ?Outcome: Progressing ?  ?Problem: Education: ?Goal: Knowledge of risk factors and measures for prevention of condition will improve ?Outcome: Progressing ?  ?

## 2019-12-26 LAB — MAGNESIUM: Magnesium: 2.3 mg/dL (ref 1.7–2.4)

## 2019-12-26 LAB — CULTURE, BLOOD (ROUTINE X 2)
Culture: NO GROWTH
Culture: NO GROWTH
Special Requests: ADEQUATE
Special Requests: ADEQUATE

## 2019-12-26 LAB — COMPREHENSIVE METABOLIC PANEL
ALT: 60 U/L — ABNORMAL HIGH (ref 0–44)
AST: 24 U/L (ref 15–41)
Albumin: 2.6 g/dL — ABNORMAL LOW (ref 3.5–5.0)
Alkaline Phosphatase: 69 U/L (ref 38–126)
Anion gap: 9 (ref 5–15)
BUN: 18 mg/dL (ref 6–20)
CO2: 23 mmol/L (ref 22–32)
Calcium: 8.1 mg/dL — ABNORMAL LOW (ref 8.9–10.3)
Chloride: 106 mmol/L (ref 98–111)
Creatinine, Ser: 0.71 mg/dL (ref 0.44–1.00)
GFR calc Af Amer: >60 mL/min (ref >60)
GFR calc non Af Amer: >60 mL/min (ref >60)
Glucose, Bld: 156 mg/dL — ABNORMAL HIGH (ref 70–99)
Potassium: 3.7 mmol/L (ref 3.5–5.1)
Sodium: 138 mmol/L (ref 135–145)
Total Bilirubin: 1.1 mg/dL (ref 0.3–1.2)
Total Protein: 5.5 g/dL — ABNORMAL LOW (ref 6.5–8.1)

## 2019-12-26 LAB — CBC WITH DIFFERENTIAL/PLATELET
Abs Immature Granulocytes: 0.41 10*3/uL — ABNORMAL HIGH (ref 0.00–0.07)
Basophils Absolute: 0 10*3/uL (ref 0.0–0.1)
Basophils Relative: 1 %
Eosinophils Absolute: 0 10*3/uL (ref 0.0–0.5)
Eosinophils Relative: 0 %
HCT: 38.1 % (ref 36.0–46.0)
Hemoglobin: 12.9 g/dL (ref 12.0–15.0)
Immature Granulocytes: 5 %
Lymphocytes Relative: 7 %
Lymphs Abs: 0.6 10*3/uL — ABNORMAL LOW (ref 0.7–4.0)
MCH: 30.4 pg (ref 26.0–34.0)
MCHC: 33.9 g/dL (ref 30.0–36.0)
MCV: 89.6 fL (ref 80.0–100.0)
Monocytes Absolute: 0.4 10*3/uL (ref 0.1–1.0)
Monocytes Relative: 4 %
Neutro Abs: 7 10*3/uL (ref 1.7–7.7)
Neutrophils Relative %: 83 %
Platelets: 454 10*3/uL — ABNORMAL HIGH (ref 150–400)
RBC: 4.25 MIL/uL (ref 3.87–5.11)
RDW: 12.8 % (ref 11.5–15.5)
WBC: 8.4 10*3/uL (ref 4.0–10.5)
nRBC: 0 % (ref 0.0–0.2)

## 2019-12-26 LAB — C-REACTIVE PROTEIN: CRP: 0.9 mg/dL (ref <1.0)

## 2019-12-26 LAB — PROCALCITONIN: Procalcitonin: <0.1 ng/mL

## 2019-12-26 LAB — BRAIN NATRIURETIC PEPTIDE: B Natriuretic Peptide: 49.6 pg/mL (ref 0.0–100.0)

## 2019-12-26 LAB — D-DIMER, QUANTITATIVE: D-Dimer, Quant: 2.62 ug/mL-FEU — ABNORMAL HIGH (ref 0.00–0.50)

## 2019-12-26 MED ORDER — SALINE SPRAY 0.65 % NA SOLN
1.0000 | NASAL | Status: DC | PRN
  Filled 2019-12-26: qty 44

## 2019-12-26 NOTE — Progress Notes (Signed)
PROGRESS NOTE                                                                                                                                                                                                             Patient Demographics:    Theresa [name], is a 49 y.o. female, DOB - 1971-02-02, KXF:[id number]  Outpatient Primary MD for the patient is Kathyrn Drown, MD    LOS - 5  Admit date - 12/21/2019    Chief Complaint  Patient presents with  . Shortness of Breath       Brief Narrative - Theresa [name]  is a 49 y.o. female, with history of seizures, osteoarthritis, obsessive-compulsive disorder, heart murmur, GERD, fibromyalgia, depression, anxiety presents to the ED at the recommendation of her PCP.  Patient reports that she had contacted her PCP regarding oxygen sats in the low 70s high 60s at home.  PCP told her that she was at risk of death if she did not return to the ER, she arrived to the [facility], ER and was diagnosed with Severe Hypoxic Resp. Failure and was admitted to the hospital.  She is not vaccinated.   Subjective:   Patient in bed, appears comfortable, denies any headache, no fever, no chest pain or pressure, no shortness of breath , no abdominal pain. No focal weakness.   Assessment  & Plan :     Acute Hypoxic Resp. Failure due to Acute Covid 19 Viral Pneumonitis during the ongoing 2020 Covid 19 Pandemic - she has severe parenchymal injury with extreme hypoxia, on high-dose IV steroids, Solu-Medrol, she refused  Actemra at the previous hospital while she was in the ER.  She remains with significant oxygen requirement today, she is on 20 L heated high flow nasal cannula, I have discussed with her about importance of incentive spirometry, flutter valve, out of bed to chair and proning.   SpO2: 90 % O2 Flow Rate (L/min): 20 L/min FiO2 (%): 50 %  Recent Labs  Lab 12/21/19 1930 12/21/19 1958  12/21/19 1958 12/21/19 2010 12/22/19 0529 12/23/19 0836 12/24/19 0401 12/25/19 0748 12/26/19 0916  WBC  --  7.3   < >  --  6.0 7.4 7.2 7.0 8.4  PLT  --  243   < >  --  218 345 357 411* 454*  CRP  10.7*  --   --   --   --  3.3* 1.4* 2.1* 0.9  AST  --  90*   < >  --  61* 53* 37 33 24  ALT  --  86*   < >  --  76* 85* 76* 69* 60*  ALKPHOS  --  86   < >  --  74 86 79 80 69  BILITOT  --  0.7   < >  --  0.5 1.1 0.7 1.3* 1.1  ALBUMIN  --  3.0*   < >  --  2.6* 2.6* 2.4* 2.6* 2.6*  DDIMER  --  1.70*   < >  --  6.15* 8.60* 5.82* 3.07* 2.62*  PROCALCITON  --  <0.10  --   --   --  <0.10 <0.10 <0.10 <0.10  LATICACIDVEN  --   --   --  1.2  --   --   --   --   --    < > = values in this interval not displayed.     2.  COVID-19 pneumonia related asymptomatic transaminitis.  Will trend and monitor.  3.  History of migraine headaches.  Imitrex as needed ordered.  4. Obesity - BMI 33, follow with PCP for weight loss.  5.  Extremely high D-dimer could be due to inflammation, on high-dose Lovenox and D-dimer trending down, continue high dose till D-dimer drops below 2, venous Dopplers are negative for DVT.    Condition - Extremely Guarded  Family Communication  : Son Lissa Hoard [phone]   updated by phone.  Code Status :  Full  Consults  :  None  Procedures  :    TTE - . Left ventricular ejection fraction, by estimation, is 65 to 70%. The left ventricle has normal function. The left ventricle has no regional wall motion abnormalities. Left ventricular diastolic parameters were normal.  2. Right ventricular systolic function is normal. The right ventricular size is normal. There is normal pulmonary artery systolic pressure. The estimated right ventricular systolic pressure is 17.0 mmHg.  3. The mitral valve is grossly normal. Trivial mitral valve regurgitation.  4. The aortic valve is tricuspid. Aortic valve regurgitation is not visualized.  5. The inferior vena cava is normal in size with greater  than 50% respiratory variability, suggesting right atrial pressure of 3 mmHg.  PUD Prophylaxis : PPI  Disposition Plan  :    Status is: Inpatient  Remains inpatient appropriate because:IV treatments appropriate due to intensity of illness or inability to take PO   Dispo: The patient is from: Home              Anticipated d/c is to: Home              Anticipated d/c date is: > 3 days              Patient currently is not medically stable to d/c.  DVT Prophylaxis  :  Lovenox   Lab Results  Component Value Date   PLT 454 (H) 12/26/2019    Diet :  Diet Order            Diet Heart Room service appropriate? Yes; Fluid consistency: Thin  Diet effective now                  Inpatient Medications  Scheduled Meds: . vitamin C  500 mg Oral Daily  . enoxaparin (LOVENOX) injection  1 mg/kg Subcutaneous Q12H  .  methylPREDNISolone (SOLU-MEDROL) injection  60 mg Intravenous Q12H  . pantoprazole  40 mg Oral Daily  . valACYclovir  500 mg Oral Daily  . zinc sulfate  220 mg Oral Daily   Continuous Infusions: . tocilizumab (ACTEMRA) - non-COVID treatment     PRN Meds:.acetaminophen, albuterol, diazepam, guaiFENesin-dextromethorphan, Ipratropium-Albuterol, [DISCONTINUED] ondansetron **OR** ondansetron (ZOFRAN) IV, polyethylene glycol, sodium chloride, SUMAtriptan  Antibiotics  :    Anti-infectives (From admission, onward)   Start     Dose/Rate Route Frequency Ordered Stop   12/23/19 1000  valACYclovir (VALTREX) tablet 500 mg        500 mg Oral Daily 12/23/19 0800     12/22/19 1000  remdesivir 100 mg in sodium chloride 0.9 % 100 mL IVPB        100 mg 200 mL/hr over 30 Minutes Intravenous Daily 12/21/19 2040 12/25/19 1744   12/21/19 2100  remdesivir 100 mg in sodium chloride 0.9 % 100 mL IVPB        100 mg 200 mL/hr over 30 Minutes Intravenous Every 30 min 12/21/19 2039 12/21/19 2230       Dailynn Nancarrow M.D on 12/26/2019 at 2:48 PM  To page go to [email or web address]   [facility] -  Office  [phone]    See all Orders from today for further details    Objective:   Vitals:   12/26/19 0602 12/26/19 0824 12/26/19 1114 12/26/19 1133  BP:  92/68  (!) 85/63  Pulse: 64 73  71  Resp: 18 15  20   Temp:  98.6 F (37 C)  98.3 F (36.8 C)  TempSrc:  Axillary  Oral  SpO2: 97% 94% 94% 90%  Weight:      Height:        Wt Readings from Last 3 Encounters:  12/22/19 91.9 kg  12/18/19 89.8 kg  11/15/19 94.7 kg     Intake/Output Summary (Last 24 hours) at 12/26/2019 1448 Last data filed at 12/26/2019 1405 Gross per 24 hour  Intake 480 ml  Output 800 ml  Net -320 ml     Physical Exam  Awake Alert, Oriented X 3, No new F.N deficits, Normal affect Symmetrical Chest wall movement, Good air movement bilaterally, CTAB RRR,No Gallops,Rubs or new Murmurs, No Parasternal Heave +ve B.Sounds, Abd Soft, No tenderness, No rebound - guarding or rigidity. No Cyanosis, Clubbing or edema, No new Rash or bruise       Data Review:    CBC Recent Labs  Lab 12/21/19 1958 12/21/19 1958 12/22/19 0529 12/23/19 0836 12/24/19 0401 12/25/19 0748 12/26/19 0916  WBC 7.3   < > 6.0 7.4 7.2 7.0 8.4  HGB 12.7   < > 11.6* 12.7 11.4* 12.4 12.9  HCT 37.3   < > 34.5* 37.4 33.9* 36.9 38.1  PLT 243   < > 218 345 357 411* 454*  MCV 90.1   < > 90.8 90.8 88.3 88.5 89.6  MCH 30.7   < > 30.5 30.8 29.7 29.7 30.4  MCHC 34.0   < > 33.6 34.0 33.6 33.6 33.9  RDW 13.2   < > 13.2 13.0 13.0 12.7 12.8  LYMPHSABS 0.3*  --  0.4*  --  0.3* 0.4* 0.6*  MONOABS 0.3  --  0.1  --  0.3 0.3 0.4  EOSABS 0.0  --  0.0  --  0.0 0.0 0.0  BASOSABS 0.0  --  0.0  --  0.0 0.0 0.0   < > = values in this interval not displayed.  Recent Labs  Lab 12/21/19 1930 12/21/19 1958 12/21/19 1958 12/21/19 2010 12/22/19 0529 12/23/19 0836 12/24/19 0401 12/25/19 0748 12/26/19 0916  NA  --  139   < >  --  140 141 140 139 138  K  --  3.5   < >  --  3.7 4.1 3.8 4.0 3.7  CL  --  107   < >  --  111  108 109 108 106  CO2  --  22   < >  --  21* 22 23 22 23   GLUCOSE  --  136*   < >  --  152* 137* 153* 113* 156*  BUN  --  12   < >  --  15 22* 20 19 18   CREATININE  --  0.62   < >  --  0.64 0.77 0.73 0.65 0.71  CALCIUM  --  8.1*   < >  --  7.7* 8.0* 8.0* 7.9* 8.1*  AST  --  90*   < >  --  61* 53* 37 33 24  ALT  --  86*   < >  --  76* 85* 76* 69* 60*  ALKPHOS  --  86   < >  --  74 86 79 80 69  BILITOT  --  0.7   < >  --  0.5 1.1 0.7 1.3* 1.1  ALBUMIN  --  3.0*   < >  --  2.6* 2.6* 2.4* 2.6* 2.6*  MG  --   --   --   --  2.3 2.4 2.3 2.2 2.3  CRP 10.7*  --   --   --   --  3.3* 1.4* 2.1* 0.9  DDIMER  --  1.70*   < >  --  6.15* 8.60* 5.82* 3.07* 2.62*  PROCALCITON  --  <0.10  --   --   --  <0.10 <0.10 <0.10 <0.10  LATICACIDVEN  --   --   --  1.2  --   --   --   --   --   BNP  --   --   --   --   --  260.5* 140.6* 103.5* 49.6   < > = values in this interval not displayed.    ------------------------------------------------------------------------------------------------------------------ No results for input(s): CHOL, HDL, LDLCALC, TRIG, CHOLHDL, LDLDIRECT in the last 72 hours.  Lab Results  Component Value Date   HGBA1C 5.8 (H) 12/19/2019   ------------------------------------------------------------------------------------------------------------------ No results for input(s): TSH, T4TOTAL, T3FREE, THYROIDAB in the last 72 hours.  Invalid input(s): FREET3  Cardiac Enzymes No results for input(s): CKMB, TROPONINI, MYOGLOBIN in the last 168 hours.  Invalid input(s): CK ------------------------------------------------------------------------------------------------------------------    Component Value Date/Time   BNP 49.6 12/26/2019 0916    Micro Results Recent Results (from the past 240 hour(s))  Blood Culture (routine x 2)     Status: None   Collection Time: 12/18/19  2:40 PM   Specimen: Left Antecubital; Blood  Result Value Ref Range Status   Specimen Description LEFT  ANTECUBITAL  Final   Special Requests   Final    BOTTLES DRAWN AEROBIC ONLY Blood Culture adequate volume   Culture   Final    NO GROWTH 5 DAYS Performed at [facility], [address] [place], [place] [address]    Report Status 12/23/2019 FINAL  Final  Blood Culture (routine x 2)     Status: None   Collection Time: 12/18/19  6:01 PM  Specimen: Left Antecubital; Blood  Result Value Ref Range Status   Specimen Description LEFT ANTECUBITAL  Final   Special Requests   Final    BOTTLES DRAWN AEROBIC AND ANAEROBIC Blood Culture adequate volume   Culture   Final    NO GROWTH 5 DAYS Performed at [facility], [address] [place], [place] [address]    Report Status 12/23/2019 FINAL  Final  SARS Coronavirus 2 by RT PCR (hospital order, performed in [facility] hospital lab) Nasopharyngeal Nasopharyngeal Swab     Status: Abnormal   Collection Time: 12/18/19  6:12 PM   Specimen: Nasopharyngeal Swab  Result Value Ref Range Status   SARS Coronavirus 2 POSITIVE (A) NEGATIVE Final    Comment: RESULT CALLED TO, READ BACK BY AND VERIFIED WITH: S WOODS,RN @1956  12/18/19 MKELLY (NOTE) SARS-CoV-2 target nucleic acids are DETECTED  SARS-CoV-2 RNA is generally detectable in upper respiratory specimens  during the acute phase of infection.  Positive results are indicative  of the presence of the identified virus, but do not rule out bacterial infection or co-infection with other pathogens not detected by the test.  Clinical correlation with patient history and  other diagnostic information is necessary to determine patient infection status.  The expected result is negative.  Fact Sheet for Patients:   StrictlyIdeas.no   Fact Sheet for Healthcare Providers:   BankingDealers.co.za    This test is not yet approved or cleared by the [place] FDA and  has been authorized for detection and/or diagnosis of SARS-CoV-2 by FDA under an Emergency  Use Authorization (EUA).  This EUA will remain in effect (meaning this test  can be used) for the duration of  the COVID-19 declaration under Section 564(b)(1) of the Act, 21 U.S.C. section 360-bbb-3(b)(1), unless the authorization is terminated or revoked sooner.  Performed at [facility], [address], [place], [place] [address]   Blood Culture (routine x 2)     Status: None   Collection Time: 12/21/19  8:10 PM   Specimen: Right Antecubital; Blood  Result Value Ref Range Status   Specimen Description RIGHT ANTECUBITAL  Final   Special Requests   Final    BOTTLES DRAWN AEROBIC AND ANAEROBIC Blood Culture adequate volume   Culture   Final    NO GROWTH 5 DAYS Performed at [facility], [address] [place], [place] [address]    Report Status 12/26/2019 FINAL  Final  Blood Culture (routine x 2)     Status: None   Collection Time: 12/21/19  8:15 PM   Specimen: Left Antecubital; Blood  Result Value Ref Range Status   Specimen Description LEFT ANTECUBITAL  Final   Special Requests   Final    BOTTLES DRAWN AEROBIC AND ANAEROBIC Blood Culture adequate volume   Culture   Final    NO GROWTH 5 DAYS Performed at [facility], [address] [place], [place] [address]    Report Status 12/26/2019 FINAL  Final    Radiology Reports DG Chest Port 1 View  Result Date: 12/24/2019 CLINICAL DATA:  Shortness of breath, cough, and intermittent oxygen desaturation. COVID-19 positive. EXAM: PORTABLE CHEST 1 VIEW COMPARISON:  12/23/2019 FINDINGS: The cardiomediastinal silhouette is unchanged. Lung volumes are unchanged with persistent mild elevation of the right hemidiaphragm. Interstitial and ill-defined airspace opacities in the left greater than right lungs have not significantly changed. No sizable pleural effusion or pneumothorax is identified. IMPRESSION: Unchanged bilateral lung opacities consistent with known COVID-19 pneumonia. Electronically Signed  By: Logan Bores M.D.   On: 12/24/2019  09:59   DG Chest Port 1 View  Result Date: 12/23/2019 CLINICAL DATA:  COVID-19. EXAM: PORTABLE CHEST 1 VIEW COMPARISON:  December 21, 2019 FINDINGS: Bilateral pulmonary infiltrates are less patchy more diffuse in the interval. The cardiomediastinal silhouette is stable. No pneumothorax. IMPRESSION: The patient's known COVID-19 pneumonia is less patchy and more diffuse in the interval, particularly on the left. No other changes. Electronically Signed   By: Dorise Bullion III M.D   On: 12/23/2019 08:39   DG Chest Port 1 View  Result Date: 12/21/2019 CLINICAL DATA:  COVID-19 positive patient who developed shortness of breath 3 days ago. EXAM: PORTABLE CHEST 1 VIEW COMPARISON:  PA and lateral chest 12/18/2019. FINDINGS: Bilateral airspace disease consistent with pneumonia has markedly worsened. Heart size is normal. No pneumothorax or pleural effusion. No acute or focal bony abnormality. IMPRESSION: Marked worsening bilateral pneumonia. Electronically Signed   By: Inge Rise M.D.   On: 12/21/2019 20:33   DG Chest Port 1 View  Result Date: 12/18/2019 CLINICAL DATA:  Hypotension, COVID-19 positive, shortness of breath and cough EXAM: PORTABLE CHEST 1 VIEW COMPARISON:  None. FINDINGS: Single frontal view of the chest demonstrates an unremarkable cardiac silhouette. There is mild diffuse interstitial prominence, with patchy bilateral ground-glass airspace disease consistent with multifocal pneumonia and diagnosis of COVID-19. No effusion or pneumothorax. No acute bony abnormalities. IMPRESSION: 1. Multifocal bilateral pneumonia consistent with COVID 19. Electronically Signed   By: Randa Ngo M.D.   On: 12/18/2019 17:20   ECHOCARDIOGRAM COMPLETE  Result Date: 12/19/2019    ECHOCARDIOGRAM REPORT   Patient Name:   FRONIE HOLSTEIN Date of Exam: 12/19/2019 Medical Rec #:  [id number]       Height:       65.0 in Accession #:    [id number]      Weight:       198.0 lb Date of Birth:  Apr 09, 1971       BSA:           1.970 m Patient Age:    32 years        BP:           122/84 mmHg Patient Gender: F               HR:           83 bpm. Exam Location:  [facility] Procedure: 2D Echo Indications:    Syncope 780.2 / R55  History:        Patient has no prior history of Echocardiogram examinations.                 Risk Factors:Non-Smoker and Hypertension. Covid 19 positive.  Sonographer:    Leavy Cella RDCS (AE) Referring Phys: [place]  1. Left ventricular ejection fraction, by estimation, is 65 to 70%. The left ventricle has normal function. The left ventricle has no regional wall motion abnormalities. Left ventricular diastolic parameters were normal.  2. Right ventricular systolic function is normal. The right ventricular size is normal. There is normal pulmonary artery systolic pressure. The estimated right ventricular systolic pressure is 83.4 mmHg.  3. The mitral valve is grossly normal. Trivial mitral valve regurgitation.  4. The aortic valve is tricuspid. Aortic valve regurgitation is not visualized.  5. The inferior vena cava is normal in size with greater than 50% respiratory variability, suggesting right atrial pressure of 3 mmHg. FINDINGS  Left Ventricle:  Left ventricular ejection fraction, by estimation, is 65 to 70%. The left ventricle has normal function. The left ventricle has no regional wall motion abnormalities. The left ventricular internal cavity size was normal in size. There is  no left ventricular hypertrophy. Left ventricular diastolic parameters were normal. Right Ventricle: The right ventricular size is normal. No increase in right ventricular wall thickness. Right ventricular systolic function is normal. There is normal pulmonary artery systolic pressure. The tricuspid regurgitant velocity is 1.73 m/s, and  with an assumed right atrial pressure of 3 mmHg, the estimated right ventricular systolic pressure is 81.8 mmHg. Left Atrium: Left atrial size was normal in size. Right  Atrium: Right atrial size was normal in size. Pericardium: There is no evidence of pericardial effusion. Mitral Valve: The mitral valve is grossly normal. Trivial mitral valve regurgitation. Tricuspid Valve: The tricuspid valve is grossly normal. Tricuspid valve regurgitation is trivial. Aortic Valve: The aortic valve is tricuspid. Aortic valve regurgitation is not visualized. Mild aortic valve annular calcification. Pulmonic Valve: The pulmonic valve was grossly normal. Pulmonic valve regurgitation is trivial. Aorta: The aortic root is normal in size and structure. Venous: The inferior vena cava is normal in size with greater than 50% respiratory variability, suggesting right atrial pressure of 3 mmHg. IAS/Shunts: No atrial level shunt detected by color flow Doppler.  LEFT VENTRICLE PLAX 2D LVIDd:         4.77 cm  Diastology LVIDs:         2.80 cm  LV e' lateral:   10.00 cm/s LV PW:         1.19 cm  LV E/e' lateral: 7.8 LV IVS:        1.02 cm  LV e' medial:    8.38 cm/s LVOT diam:     2.00 cm  LV E/e' medial:  9.3 LVOT Area:     3.14 cm  RIGHT VENTRICLE RV S prime:     11.60 cm/s TAPSE (M-mode): 2.2 cm LEFT ATRIUM             Index       RIGHT ATRIUM           Index LA diam:        3.70 cm 1.88 cm/m  RA Area:     10.40 cm LA Vol (A2C):   43.0 ml 21.83 ml/m RA Volume:   21.60 ml  10.97 ml/m LA Vol (A4C):   40.8 ml 20.71 ml/m LA Biplane Vol: 44.0 ml 22.34 ml/m   AORTA Ao Root diam: 3.10 cm MITRAL VALVE               TRICUSPID VALVE MV Area (PHT): 3.28 cm    TR Peak grad:   12.0 mmHg MV Decel Time: 231 msec    TR Vmax:        173.00 cm/s MV E velocity: 78.00 cm/s MV A velocity: 56.10 cm/s  SHUNTS MV E/A ratio:  1.39        Systemic Diam: 2.00 cm Rozann Lesches MD Electronically signed by Rozann Lesches MD Signature Date/Time: 12/19/2019/10:26:56 AM    Final    VAS [place] LOWER EXTREMITY VENOUS (DVT)  Result Date: 12/25/2019  Lower Venous DVTStudy Indications: Rising D-dimer, Covid +.  Performing Technologist:  Antonieta Pert RDMS, RVT  Examination Guidelines: A complete evaluation includes B-mode imaging, spectral Doppler, color Doppler, and power Doppler as needed of all accessible portions of each vessel. Bilateral testing is considered an integral part of a complete  examination. Limited examinations for reoccurring indications may be performed as noted. The reflux portion of the exam is performed with the patient in reverse Trendelenburg.  +---------+---------------+---------+-----------+----------+--------------+ RIGHT    CompressibilityPhasicitySpontaneityPropertiesThrombus Aging +---------+---------------+---------+-----------+----------+--------------+ CFV      Full           Yes      Yes                                 +---------+---------------+---------+-----------+----------+--------------+ SFJ      Full                                                        +---------+---------------+---------+-----------+----------+--------------+ FV Prox  Full                                                        +---------+---------------+---------+-----------+----------+--------------+ FV Mid   Full                                                        +---------+---------------+---------+-----------+----------+--------------+ FV DistalFull                                                        +---------+---------------+---------+-----------+----------+--------------+ PFV      Full                                                        +---------+---------------+---------+-----------+----------+--------------+ POP      Full           Yes      Yes                                 +---------+---------------+---------+-----------+----------+--------------+ PTV      Full                                                        +---------+---------------+---------+-----------+----------+--------------+ PERO     Full                                                         +---------+---------------+---------+-----------+----------+--------------+ GSV      Full                                                        +---------+---------------+---------+-----------+----------+--------------+   +---------+---------------+---------+-----------+----------+--------------+  LEFT     CompressibilityPhasicitySpontaneityPropertiesThrombus Aging +---------+---------------+---------+-----------+----------+--------------+ CFV      Full           Yes      Yes                                 +---------+---------------+---------+-----------+----------+--------------+ SFJ      Full                                                        +---------+---------------+---------+-----------+----------+--------------+ FV Prox  Full                                                        +---------+---------------+---------+-----------+----------+--------------+ FV Mid   Full                                                        +---------+---------------+---------+-----------+----------+--------------+ FV DistalFull                                                        +---------+---------------+---------+-----------+----------+--------------+ PFV      Full                                                        +---------+---------------+---------+-----------+----------+--------------+ POP      Full           Yes      Yes                                 +---------+---------------+---------+-----------+----------+--------------+ PTV      Full                                                        +---------+---------------+---------+-----------+----------+--------------+ PERO     Full                                                        +---------+---------------+---------+-----------+----------+--------------+ GSV      Full                                                         +---------+---------------+---------+-----------+----------+--------------+  Summary: RIGHT: - There is no evidence of deep vein thrombosis in the lower extremity.  - No cystic structure found in the popliteal fossa.  LEFT: - There is no evidence of deep vein thrombosis in the lower extremity.  - No cystic structure found in the popliteal fossa.  *See table(s) above for measurements and observations. Electronically signed by Harold Barban MD on 12/25/2019 at 5:40:18 PM.    Final

## 2019-12-26 NOTE — Progress Notes (Signed)
Physical Therapy Treatment Patient Details Name: Theresa [name] MRN: [id number] DOB: [birth year] Today's Date: 12/26/2019    History of Present Illness Pt is a 49 y.o. female initially dx with COVID-19 on 12/11/19, now admitted 12/21/19 with severe hypoxia. Workup for acute hypoxic respiratory failure due to acute Covid 19 viral pneumonitis. PMH includes seizures, OA, OCD, fibromyalgia, depression, anxiety.   PT Comments    Pt progressing well with mobility. Today's session focused on seated and standing therex, pt demonstrates improved balance; remains limited by decreased activity tolerance requiring multiple seated rest breaks. SpO2 86-93% on 15L O2 HHFNC at 50% FiO2. Pt motivated to participate and regain PLOF.   Follow Up Recommendations  Home health PT     Equipment Recommendations  None recommended by PT    Recommendations for Other Services       Precautions / Restrictions Precautions Precautions: Other (comment) Precaution Comments: watch O2; anxiety Restrictions Weight Bearing Restrictions: No    Mobility  Bed Mobility               General bed mobility comments: up in recliner  Transfers Overall transfer level: Modified independent Equipment used: None Transfers: Sit to/from Stand           General transfer comment: multiple sit<>stands from recliner, good line management  Ambulation/Gait Ambulation/Gait assistance: Supervision;Min guard Gait Distance (Feet): 40 Feet Assistive device: None Gait Pattern/deviations: Step-through pattern;Decreased stride length Gait velocity: Decreased   General Gait Details: Amb 4' forwards and 4' backwards x3 (within confines of HHFNC lines), initial min guard for balance progressing to supervision; seated rest then amb 2x more while pt managing her own lines.   Stairs             Wheelchair Mobility    Modified Rankin (Stroke Patients Only)       Balance Overall balance assessment: Needs  assistance   Sitting balance-Leahy Scale: Good       Standing balance-Leahy Scale: Fair                              Cognition Arousal/Alertness: Awake/alert Behavior During Therapy: WFL for tasks assessed/performed Overall Cognitive Status: No family/caregiver present to determine baseline cognitive functioning Area of Impairment: Attention                   Current Attention Level: Selective           General Comments: did not note slowed processing this session      Exercises Other Exercises Other Exercises: 5x sit<>stand, walking backwards/forwards (within confines of HHFNC/lines), marching in place for ~20-sec bouts Other Exercises: Incentive spirometer (pt pulling 1250 mL with good technique)    General Comments General comments (skin integrity, edema, etc.): SpO2 86-93% on 15L O2 HHFNC at 50% FiO2, HR 76, BP 87/65 (has been low all morning; pt asymptomatic)      Pertinent Vitals/Pain Pain Assessment: No/denies pain    Home Living                      Prior Function            PT Goals (current goals can now be found in the care plan section) Progress towards PT goals: Progressing toward goals    Frequency    Min 3X/week      PT Plan Current plan remains appropriate    Co-evaluation  AM-PAC PT "6 Clicks" Mobility   Outcome Measure  Help needed turning from your back to your side while in a flat bed without using bedrails?: None Help needed moving from lying on your back to sitting on the side of a flat bed without using bedrails?: None Help needed moving to and from a bed to a chair (including a wheelchair)?: None Help needed standing up from a chair using your arms (e.g., wheelchair or bedside chair)?: None Help needed to walk in hospital room?: A Little Help needed climbing 3-5 steps with a railing? : A Little 6 Click Score: 22    End of Session Equipment Utilized During Treatment:  Oxygen Activity Tolerance: Patient tolerated treatment well Patient left: in chair;with call bell/phone within reach Nurse Communication: Mobility status PT Visit Diagnosis: Difficulty in walking, not elsewhere classified (R26.2);Muscle weakness (generalized) (M62.81)     Time: 6122-4497 PT Time Calculation (min) (ACUTE ONLY): 27 min  Charges:  $Therapeutic Exercise: 23-37 mins                     Mabeline Caras, PT, DPT Acute Rehabilitation Services  Pager [phone] Office [phone]  Derry Lory 12/26/2019, 2:09 PM

## 2019-12-27 LAB — COMPREHENSIVE METABOLIC PANEL
ALT: 59 U/L — ABNORMAL HIGH (ref 0–44)
AST: 27 U/L (ref 15–41)
Albumin: 2.7 g/dL — ABNORMAL LOW (ref 3.5–5.0)
Alkaline Phosphatase: 70 U/L (ref 38–126)
Anion gap: 11 (ref 5–15)
BUN: 16 mg/dL (ref 6–20)
CO2: 23 mmol/L (ref 22–32)
Calcium: 8.2 mg/dL — ABNORMAL LOW (ref 8.9–10.3)
Chloride: 104 mmol/L (ref 98–111)
Creatinine, Ser: 0.77 mg/dL (ref 0.44–1.00)
GFR calc Af Amer: >60 mL/min (ref >60)
GFR calc non Af Amer: >60 mL/min (ref >60)
Glucose, Bld: 189 mg/dL — ABNORMAL HIGH (ref 70–99)
Potassium: 4.2 mmol/L (ref 3.5–5.1)
Sodium: 138 mmol/L (ref 135–145)
Total Bilirubin: 1.2 mg/dL (ref 0.3–1.2)
Total Protein: 5.8 g/dL — ABNORMAL LOW (ref 6.5–8.1)

## 2019-12-27 LAB — CBC WITH DIFFERENTIAL/PLATELET
Abs Immature Granulocytes: 0.52 10*3/uL — ABNORMAL HIGH (ref 0.00–0.07)
Basophils Absolute: 0 10*3/uL (ref 0.0–0.1)
Basophils Relative: 0 %
Eosinophils Absolute: 0 10*3/uL (ref 0.0–0.5)
Eosinophils Relative: 0 %
HCT: 38.8 % (ref 36.0–46.0)
Hemoglobin: 12.8 g/dL (ref 12.0–15.0)
Immature Granulocytes: 6 %
Lymphocytes Relative: 4 %
Lymphs Abs: 0.4 10*3/uL — ABNORMAL LOW (ref 0.7–4.0)
MCH: 29.6 pg (ref 26.0–34.0)
MCHC: 33 g/dL (ref 30.0–36.0)
MCV: 89.6 fL (ref 80.0–100.0)
Monocytes Absolute: 0.3 10*3/uL (ref 0.1–1.0)
Monocytes Relative: 3 %
Neutro Abs: 8.1 10*3/uL — ABNORMAL HIGH (ref 1.7–7.7)
Neutrophils Relative %: 87 %
Platelets: 434 10*3/uL — ABNORMAL HIGH (ref 150–400)
RBC: 4.33 MIL/uL (ref 3.87–5.11)
RDW: 12.8 % (ref 11.5–15.5)
WBC: 9.3 10*3/uL (ref 4.0–10.5)
nRBC: 0 % (ref 0.0–0.2)

## 2019-12-27 LAB — D-DIMER, QUANTITATIVE: D-Dimer, Quant: 2.36 ug/mL-FEU — ABNORMAL HIGH (ref 0.00–0.50)

## 2019-12-27 LAB — C-REACTIVE PROTEIN: CRP: 0.7 mg/dL (ref <1.0)

## 2019-12-27 LAB — MAGNESIUM: Magnesium: 2.2 mg/dL (ref 1.7–2.4)

## 2019-12-27 LAB — PROCALCITONIN: Procalcitonin: <0.1 ng/mL

## 2019-12-27 LAB — BRAIN NATRIURETIC PEPTIDE: B Natriuretic Peptide: 43.4 pg/mL (ref 0.0–100.0)

## 2019-12-27 MED ORDER — ENOXAPARIN SODIUM 60 MG/0.6ML ~~LOC~~ SOLN
0.5000 mg/kg | Freq: Two times a day (BID) | SUBCUTANEOUS | Status: DC
  Administered 2019-12-27: 45 mg via SUBCUTANEOUS
  Filled 2019-12-27: qty 0.6

## 2019-12-27 NOTE — Progress Notes (Signed)
Patient's VS turn to yellow for respiration and BP. No acute change. Patient is alert and oriented. Patient's conditions discussed with CN Ronalee Belts. Will continue monitor.

## 2019-12-27 NOTE — Progress Notes (Signed)
Occupational Therapy Treatment Patient Details Name: Theresa [name] MRN: [id number] DOB: [birth year] Today's Date: 12/27/2019    History of present illness Pt is a 49 y.o. female initially dx with COVID-19 on 12/11/19, now admitted 12/21/19 with severe hypoxia. Workup for acute hypoxic respiratory failure due to acute Covid 19 viral pneumonitis. PMH includes seizures, OA, OCD, fibromyalgia, depression, anxiety.   OT comments  Pt seen for OT follow up session with focus on ECS education and implementation. As OT arrived, pt had recently finished showering- which she states she did independently and it went well. Noted poor safety/awareness with pt often overextending herself with being perseverative on going home. Educated pt on pursed lip breathing, pacing self, rest breaks, completing short bouts of activity, and how to modify home/workplace for ample ECS. Also educated pt on how to self monitor SpO2 sats with personal pulse ox. D/c recs remain appropriate, will continue to follow while acute.  Vitals: Pt required 6L of O2 via HFNC SpO2 >88% with ADL mobility.    Follow Up Recommendations  No OT follow up;Supervision - Intermittent    Equipment Recommendations  3 in 1 bedside commode    Recommendations for Other Services      Precautions / Restrictions Precautions Precautions: Other (comment) Precaution Comments: watch O2; anxiety Restrictions Weight Bearing Restrictions: No       Mobility Bed Mobility               General bed mobility comments: up in recliner  Transfers Overall transfer level: Modified independent               General transfer comment: multiple sit<>stands from recliner, good line management    Balance Overall balance assessment: Needs assistance Sitting-balance support: No upper extremity supported Sitting balance-Leahy Scale: Good     Standing balance support: No upper extremity supported;During functional activity Standing balance-Leahy  Scale: Fair                             ADL either performed or assessed with clinical judgement   ADL Overall ADL's : Needs assistance/impaired                                       General ADL Comments: session focused on ECS education with multistep BADL. Pt had just finished shower on OT arrival to room     Vision Patient Visual Report: No change from baseline     Perception     Praxis      Cognition Arousal/Alertness: Awake/alert Behavior During Therapy: WFL for tasks assessed/performed Overall Cognitive Status: No family/caregiver present to determine baseline cognitive functioning Area of Impairment: Safety/judgement                         Safety/Judgement: Decreased awareness of safety     General Comments: noted slight decreased awareness of safety. Pt often times overextends herself mobility wise and perseverative on getting home despite whether or not it is safe        Exercises Other Exercises Other Exercises: incentive spirometer: x5 pulls 1237mL   Shoulder Instructions       General Comments      Pertinent Vitals/ Pain       Pain Assessment: No/denies pain  Home Living  Prior Functioning/Environment              Frequency  Min 2X/week        Progress Toward Goals  OT Goals(current goals can now be found in the care plan section)  Progress towards OT goals: Progressing toward goals  Acute Rehab OT Goals Patient Stated Goal: get well and go home to her kids OT Goal Formulation: With patient Time For Goal Achievement: 01/08/20 Potential to Achieve Goals: Good  Plan Discharge plan remains appropriate    Co-evaluation                 AM-PAC OT "6 Clicks" Daily Activity     Outcome Measure   Help from another person eating meals?: None Help from another person taking care of personal grooming?: A Little Help from another  person toileting, which includes using toliet, bedpan, or urinal?: A Little Help from another person bathing (including washing, rinsing, drying)?: A Little Help from another person to put on and taking off regular upper body clothing?: A Little Help from another person to put on and taking off regular lower body clothing?: A Little 6 Click Score: 19    End of Session Equipment Utilized During Treatment: Oxygen  OT Visit Diagnosis: Unsteadiness on feet (R26.81);Muscle weakness (generalized) (M62.81);Other symptoms and signs involving cognitive function   Activity Tolerance Patient tolerated treatment well   Patient Left in chair;with call bell/phone within reach   Nurse Communication Mobility status        Time: 7412-8786 OT Time Calculation (min): 26 min  Charges: OT General Charges $OT Visit: 1 Visit OT Treatments $Self Care/Home Management : 23-37 mins  Zenovia Jarred, MSOT, OTR/L [facility] Office Number: [phone] Pager: [phone]  Zenovia Jarred 12/27/2019, 6:40 PM

## 2019-12-27 NOTE — TOC Initial Note (Signed)
Transition of Care [facility]) - Initial/Assessment Note    Patient Details  Name: Theresa [name] MRN: [id number] Date of Birth: Sep 28, 1970  [facility]) CM/SW Contact:    Verdell Carmine, RN Phone Number: 12/27/2019, 9:46 AM  Clinical Narrative:                 Admitted 5 days ago with COVID still on higher flow oxygen and decompensating when ambulating. PT recommended HH. Will see if still recommends as discharge gets closer. Follow up appointment made with primary care.   Expected Discharge Plan: [place] Barriers to Discharge: Continued Medical Work up   Patient Goals and CMS Choice        Expected Discharge Plan and Services Expected Discharge Plan: [place]       Living arrangements for the past 2 months: Single Family Home                                      Prior Living Arrangements/Services Living arrangements for the past 2 months: Single Family Home   Patient language and need for interpreter reviewed:: Yes        Need for Family Participation in Patient Care: Yes (Comment) Care giver support system in place?: Yes (comment)   Criminal Activity/Legal Involvement Pertinent to Current Situation/Hospitalization: No - Comment as needed  Activities of Daily Living      Permission Sought/Granted                  Emotional Assessment       Orientation: : Oriented to Self, Oriented to  Time, Oriented to Place, Oriented to Situation Alcohol / Substance Use: Not Applicable Psych Involvement: No (comment)  Admission diagnosis:  COVID-19 [U07.1] Patient Active Problem List   Diagnosis Date Noted  . Acute respiratory failure with hypoxia ([place]) 12/22/2019  . COVID-19 12/21/2019  . Pneumonia due to COVID-19 virus 12/18/2019  . Attention deficit disorder (ADD) without hyperactivity 11/15/2019  . Hyperplastic colon polyp 10/28/2019  . History of adenomatous polyp of colon 08/28/2019  . Dysphagia  08/28/2019  . Acute diverticulitis 08/13/2019  . Hypokalemia 08/13/2019  . Elevated liver transaminase level   . Vaginal discharge 07/11/2019  . Hair loss 07/11/2019  . Body aches 07/11/2019  . Brain fog 07/11/2019  . Rectocele 07/11/2019  . Sleep disturbance 07/11/2019  . Gastroesophageal reflux disease without esophagitis 05/21/2019  . Prediabetes 11/26/2017  . Migraine without aura and without status migrainosus, not intractable 10/04/2017  . Morbid obesity ([place]) 04/10/2016  . Diverticulosis of large intestine 12/05/2015  . BRCA negative 02/10/2015  . IFG (impaired fasting glucose) 07/05/2014  . Essential hypertension, benign 04/27/2014  . Anxiety 04/27/2014  . Abdominal pain, epigastric 02/07/2014  . Nausea without vomiting 02/07/2014  . Bloating 02/07/2014  . Fatigue 09/18/2012  . Hyperglycemia 09/18/2012  . Dysthymia 07/14/2012  . Insomnia due to mental disorder 07/14/2012  . Lower abdominal pain 04/12/2012  . Headache 04/12/2012  . Constipation 04/12/2012   PCP:  Kathyrn Drown, MD Pharmacy:   [place], [place] - [address] [place] [address] Phone: [phone] Fax: [phone]  [facility] [phone] - [place], [place] AT [place] [address] [place] [address] Phone: [phone] Fax: [phone]     Social  Determinants of Health (SDOH) Interventions    Readmission Risk Interventions No flowsheet data found.  

## 2019-12-27 NOTE — Plan of Care (Signed)
  Problem: Education: Goal: Knowledge of General Education information will improve Description Including pain rating scale, medication(s)/side effects and non-pharmacologic comfort measures Outcome: Progressing   

## 2019-12-27 NOTE — Progress Notes (Signed)
PROGRESS NOTE                                                                                                                                                                                                             Patient Demographics:    Theresa [name], is a 49 y.o. female, DOB - May 11, 1970, QBH:[id number]  Outpatient Primary MD for the patient is Kathyrn Drown, MD    LOS - 6  Admit date - 12/21/2019    Chief Complaint  Patient presents with  . Shortness of Breath       Brief Narrative - Theresa [name]  is a 49 y.o. female, with history of seizures, osteoarthritis, obsessive-compulsive disorder, heart murmur, GERD, fibromyalgia, depression, anxiety presents to the ED at the recommendation of her PCP.  Patient reports that she had contacted her PCP regarding oxygen sats in the low 70s high 60s at home.  PCP told her that she was at risk of death if she did not return to the ER, she arrived to the [facility], ER and was diagnosed with Severe Hypoxic Resp. Failure and was admitted to the hospital.  She is not vaccinated.   Subjective:   Patient in bed, appears comfortable, denies any headache, chest pain or shortness of breath today, she reports mild dyspnea with activity, reports she is able to go to the bathroom with oxygen via oxygen tank.  Assessment  & Plan :     Acute Hypoxic Resp. Failure due to Acute Covid 19 Viral Pneumonitis during the ongoing 2020 Covid 19 Pandemic  - she has severe parenchymal injury with extreme hypoxia,. - on high-dose IV steroids, Solu-Medrol, - She refused  Actemra at the previous hospital. -  She remains with significant oxygen requirement today, she is with improved oxygen requirement, but remains significant at 15 L heated hight flow, will try to transition to salter,I have encouraged her to use incentive spirometry, flutter valve, out of bed to chair and proning.   SpO2: (!) 85 % O2  Flow Rate (L/min): 15 L/min FiO2 (%): 50 %  Recent Labs  Lab 12/21/19 2010 12/22/19 0529 12/23/19 0836 12/24/19 0401 12/25/19 0748 12/26/19 0916 12/27/19 0257  WBC  --    < > 7.4 7.2 7.0 8.4 9.3  PLT  --    < > 345 357 411* 454*  434*  CRP  --   --  3.3* 1.4* 2.1* 0.9 0.7  AST  --    < > 53* 37 33 24 27  ALT  --    < > 85* 76* 69* 60* 59*  ALKPHOS  --    < > 86 79 80 69 70  BILITOT  --    < > 1.1 0.7 1.3* 1.1 1.2  ALBUMIN  --    < > 2.6* 2.4* 2.6* 2.6* 2.7*  DDIMER  --    < > 8.60* 5.82* 3.07* 2.62* 2.36*  PROCALCITON  --   --  <0.10 <0.10 <0.10 <0.10 <0.10  LATICACIDVEN 1.2  --   --   --   --   --   --    < > = values in this interval not displayed.     COVID-19 pneumonia related asymptomatic transaminitis.  Trending down  History of migraine headaches.  Imitrex as needed ordered.  Obesity - BMI 33, follow with PCP for weight loss.  Extremely high D-dimer could be due to inflammation, it is trending down, so I will change her Lovenox to 0.5 mg/kg every 12 hours, can transition to regular prophylaxis dose once D-dimer less than 2, on high-dose Lovenox and D-dimer trending, venous Dopplers are negative for DVT.    Condition - Extremely Guarded  Family Communication  : Son Theresa [name] [phone]   updated by phone 8/25  Code Status :  Full  Consults  :  None  Procedures  :    TTE - . Left ventricular ejection fraction, by estimation, is 65 to 70%. The left ventricle has normal function. The left ventricle has no regional wall motion abnormalities. Left ventricular diastolic parameters were normal.  2. Right ventricular systolic function is normal. The right ventricular size is normal. There is normal pulmonary artery systolic pressure. The estimated right ventricular systolic pressure is 56.2 mmHg.  3. The mitral valve is grossly normal. Trivial mitral valve regurgitation.  4. The aortic valve is tricuspid. Aortic valve regurgitation is not visualized.  5. The inferior vena cava  is normal in size with greater than 50% respiratory variability, suggesting right atrial pressure of 3 mmHg.  PUD Prophylaxis : PPI  Disposition Plan  :    Status is: Inpatient  Remains inpatient appropriate because:IV treatments appropriate due to intensity of illness or inability to take PO   Dispo: The patient is from: Home              Anticipated d/c is to: Home              Anticipated d/c date is: > 3 days              Patient currently is not medically stable to d/c.  DVT Prophylaxis  :  Lovenox   Lab Results  Component Value Date   PLT 434 (H) 12/27/2019    Diet :  Diet Order            Diet Heart Room service appropriate? Yes; Fluid consistency: Thin  Diet effective now                  Inpatient Medications  Scheduled Meds: . vitamin C  500 mg Oral Daily  . enoxaparin (LOVENOX) injection  1 mg/kg Subcutaneous Q12H  . methylPREDNISolone (SOLU-MEDROL) injection  60 mg Intravenous Q12H  . pantoprazole  40 mg Oral Daily  . valACYclovir  500 mg Oral Daily  . zinc sulfate  220 mg Oral Daily   Continuous Infusions: . tocilizumab (ACTEMRA) - non-COVID treatment     PRN Meds:.acetaminophen, albuterol, diazepam, guaiFENesin-dextromethorphan, Ipratropium-Albuterol, [DISCONTINUED] ondansetron **OR** ondansetron (ZOFRAN) IV, polyethylene glycol, sodium chloride, SUMAtriptan  Antibiotics  :    Anti-infectives (From admission, onward)   Start     Dose/Rate Route Frequency Ordered Stop   12/23/19 1000  valACYclovir (VALTREX) tablet 500 mg        500 mg Oral Daily 12/23/19 0800     12/22/19 1000  remdesivir 100 mg in sodium chloride 0.9 % 100 mL IVPB        100 mg 200 mL/hr over 30 Minutes Intravenous Daily 12/21/19 2040 12/25/19 1744   12/21/19 2100  remdesivir 100 mg in sodium chloride 0.9 % 100 mL IVPB        100 mg 200 mL/hr over 30 Minutes Intravenous Every 30 min 12/21/19 2039 12/21/19 2230       Joely Losier M.D on 12/27/2019 at 1:13 PM  To page  go to [email or web address]   [facility] -  Office  [phone]    See all Orders from today for further details    Objective:   Vitals:   12/27/19 0220 12/27/19 0227 12/27/19 0504 12/27/19 0745  BP:   110/78 102/70  Pulse: (!) 53  71 65  Resp: (!) 31 19 20 20   Temp:   98.2 F (36.8 C) 97.9 F (36.6 C)  TempSrc:   Oral Oral  SpO2: 91%  90% (!) 85%  Weight:      Height:        Wt Readings from Last 3 Encounters:  12/22/19 91.9 kg  12/18/19 89.8 kg  11/15/19 94.7 kg     Intake/Output Summary (Last 24 hours) at 12/27/2019 1313 Last data filed at 12/27/2019 0100 Gross per 24 hour  Intake 480 ml  Output --  Net 480 ml     Physical Exam  Awake Alert, Oriented X 3, No new F.N deficits, Normal affect Symmetrical Chest wall movement, Good air movement bilaterally, CTAB RRR,No Gallops,Rubs or new Murmurs, No Parasternal Heave +ve B.Sounds, Abd Soft, No tenderness, No rebound - guarding or rigidity. No Cyanosis, Clubbing or edema, No new Rash or bruise         Data Review:    CBC Recent Labs  Lab 12/22/19 0529 12/22/19 0529 12/23/19 0836 12/24/19 0401 12/25/19 0748 12/26/19 0916 12/27/19 0257  WBC 6.0   < > 7.4 7.2 7.0 8.4 9.3  HGB 11.6*   < > 12.7 11.4* 12.4 12.9 12.8  HCT 34.5*   < > 37.4 33.9* 36.9 38.1 38.8  PLT 218   < > 345 357 411* 454* 434*  MCV 90.8   < > 90.8 88.3 88.5 89.6 89.6  MCH 30.5   < > 30.8 29.7 29.7 30.4 29.6  MCHC 33.6   < > 34.0 33.6 33.6 33.9 33.0  RDW 13.2   < > 13.0 13.0 12.7 12.8 12.8  LYMPHSABS 0.4*  --   --  0.3* 0.4* 0.6* 0.4*  MONOABS 0.1  --   --  0.3 0.3 0.4 0.3  EOSABS 0.0  --   --  0.0 0.0 0.0 0.0  BASOSABS 0.0  --   --  0.0 0.0 0.0 0.0   < > = values in this interval not displayed.    Recent Labs  Lab 12/21/19 2010 12/22/19 5284 12/23/19 1324 12/24/19 0401 12/25/19 0748 12/26/19 0916 12/27/19 0257  NA  --    < >  141 140 139 138 138  K  --    < > 4.1 3.8 4.0 3.7 4.2  CL  --    < > 108 109 108 106 104   CO2  --    < > 22 23 22 23 23   GLUCOSE  --    < > 137* 153* 113* 156* 189*  BUN  --    < > 22* 20 19 18 16   CREATININE  --    < > 0.77 0.73 0.65 0.71 0.77  CALCIUM  --    < > 8.0* 8.0* 7.9* 8.1* 8.2*  AST  --    < > 53* 37 33 24 27  ALT  --    < > 85* 76* 69* 60* 59*  ALKPHOS  --    < > 86 79 80 69 70  BILITOT  --    < > 1.1 0.7 1.3* 1.1 1.2  ALBUMIN  --    < > 2.6* 2.4* 2.6* 2.6* 2.7*  MG  --    < > 2.4 2.3 2.2 2.3 2.2  CRP  --   --  3.3* 1.4* 2.1* 0.9 0.7  DDIMER  --    < > 8.60* 5.82* 3.07* 2.62* 2.36*  PROCALCITON  --   --  <0.10 <0.10 <0.10 <0.10 <0.10  LATICACIDVEN 1.2  --   --   --   --   --   --   BNP  --   --  260.5* 140.6* 103.5* 49.6 43.4   < > = values in this interval not displayed.    ------------------------------------------------------------------------------------------------------------------ No results for input(s): CHOL, HDL, LDLCALC, TRIG, CHOLHDL, LDLDIRECT in the last 72 hours.  Lab Results  Component Value Date   HGBA1C 5.8 (H) 12/19/2019   ------------------------------------------------------------------------------------------------------------------ No results for input(s): TSH, T4TOTAL, T3FREE, THYROIDAB in the last 72 hours.  Invalid input(s): FREET3  Cardiac Enzymes No results for input(s): CKMB, TROPONINI, MYOGLOBIN in the last 168 hours.  Invalid input(s): CK ------------------------------------------------------------------------------------------------------------------    Component Value Date/Time   BNP 43.4 12/27/2019 0257    Micro Results Recent Results (from the past 240 hour(s))  Blood Culture (routine x 2)     Status: None   Collection Time: 12/18/19  2:40 PM   Specimen: Left Antecubital; Blood  Result Value Ref Range Status   Specimen Description LEFT ANTECUBITAL  Final   Special Requests   Final    BOTTLES DRAWN AEROBIC ONLY Blood Culture adequate volume   Culture   Final    NO GROWTH 5 DAYS Performed at [facility], [address] [place], [place] [address]    Report Status 12/23/2019 FINAL  Final  Blood Culture (routine x 2)     Status: None   Collection Time: 12/18/19  6:01 PM   Specimen: Left Antecubital; Blood  Result Value Ref Range Status   Specimen Description LEFT ANTECUBITAL  Final   Special Requests   Final    BOTTLES DRAWN AEROBIC AND ANAEROBIC Blood Culture adequate volume   Culture   Final    NO GROWTH 5 DAYS Performed at [facility], [address] [place], [place] [address]    Report Status 12/23/2019 FINAL  Final  SARS Coronavirus 2 by RT PCR (hospital order, performed in [place] hospital lab) Nasopharyngeal Nasopharyngeal Swab     Status: Abnormal   Collection Time: 12/18/19  6:12 PM   Specimen: Nasopharyngeal Swab  Result Value Ref Range Status   SARS Coronavirus  2 POSITIVE (A) NEGATIVE Final    Comment: RESULT CALLED TO, READ BACK BY AND VERIFIED WITH: S WOODS,RN @1956  12/18/19 MKELLY (NOTE) SARS-CoV-2 target nucleic acids are DETECTED  SARS-CoV-2 RNA is generally detectable in upper respiratory specimens  during the acute phase of infection.  Positive results are indicative  of the presence of the identified virus, but do not rule out bacterial infection or co-infection with other pathogens not detected by the test.  Clinical correlation with patient history and  other diagnostic information is necessary to determine patient infection status.  The expected result is negative.  Fact Sheet for Patients:   StrictlyIdeas.no   Fact Sheet for Healthcare Providers:   BankingDealers.co.za    This test is not yet approved or cleared by the [place] FDA and  has been authorized for detection and/or diagnosis of SARS-CoV-2 by FDA under an Emergency Use Authorization (EUA).  This EUA will remain in effect (meaning this test  can be used) for the duration of  the COVID-19 declaration under Section 564(b)(1) of the Act,  21 U.S.C. section 360-bbb-3(b)(1), unless the authorization is terminated or revoked sooner.  Performed at [facility], [address], [place], [place] [address]   Blood Culture (routine x 2)     Status: None   Collection Time: 12/21/19  8:10 PM   Specimen: Right Antecubital; Blood  Result Value Ref Range Status   Specimen Description RIGHT ANTECUBITAL  Final   Special Requests   Final    BOTTLES DRAWN AEROBIC AND ANAEROBIC Blood Culture adequate volume   Culture   Final    NO GROWTH 5 DAYS Performed at [facility], [address] [place], [place] [address]    Report Status 12/26/2019 FINAL  Final  Blood Culture (routine x 2)     Status: None   Collection Time: 12/21/19  8:15 PM   Specimen: Left Antecubital; Blood  Result Value Ref Range Status   Specimen Description LEFT ANTECUBITAL  Final   Special Requests   Final    BOTTLES DRAWN AEROBIC AND ANAEROBIC Blood Culture adequate volume   Culture   Final    NO GROWTH 5 DAYS Performed at [facility], [address] [place], [place] [address]    Report Status 12/26/2019 FINAL  Final    Radiology Reports DG Chest Port 1 View  Result Date: 12/24/2019 CLINICAL DATA:  Shortness of breath, cough, and intermittent oxygen desaturation. COVID-19 positive. EXAM: PORTABLE CHEST 1 VIEW COMPARISON:  12/23/2019 FINDINGS: The cardiomediastinal silhouette is unchanged. Lung volumes are unchanged with persistent mild elevation of the right hemidiaphragm. Interstitial and ill-defined airspace opacities in the left greater than right lungs have not significantly changed. No sizable pleural effusion or pneumothorax is identified. IMPRESSION: Unchanged bilateral lung opacities consistent with known COVID-19 pneumonia. Electronically Signed   By: Logan Bores M.D.   On: 12/24/2019 09:59   DG Chest Port 1 View  Result Date: 12/23/2019 CLINICAL DATA:  COVID-19. EXAM: PORTABLE CHEST 1 VIEW COMPARISON:  December 21, 2019 FINDINGS: Bilateral pulmonary  infiltrates are less patchy more diffuse in the interval. The cardiomediastinal silhouette is stable. No pneumothorax. IMPRESSION: The patient's known COVID-19 pneumonia is less patchy and more diffuse in the interval, particularly on the left. No other changes. Electronically Signed   By: Dorise Bullion III M.D   On: 12/23/2019 08:39   DG Chest Port 1 View  Result Date: 12/21/2019 CLINICAL DATA:  COVID-19 positive patient who developed shortness of breath 3 days ago.  EXAM: PORTABLE CHEST 1 VIEW COMPARISON:  PA and lateral chest 12/18/2019. FINDINGS: Bilateral airspace disease consistent with pneumonia has markedly worsened. Heart size is normal. No pneumothorax or pleural effusion. No acute or focal bony abnormality. IMPRESSION: Marked worsening bilateral pneumonia. Electronically Signed   By: Inge Rise M.D.   On: 12/21/2019 20:33   DG Chest Port 1 View  Result Date: 12/18/2019 CLINICAL DATA:  Hypotension, COVID-19 positive, shortness of breath and cough EXAM: PORTABLE CHEST 1 VIEW COMPARISON:  None. FINDINGS: Single frontal view of the chest demonstrates an unremarkable cardiac silhouette. There is mild diffuse interstitial prominence, with patchy bilateral ground-glass airspace disease consistent with multifocal pneumonia and diagnosis of COVID-19. No effusion or pneumothorax. No acute bony abnormalities. IMPRESSION: 1. Multifocal bilateral pneumonia consistent with COVID 19. Electronically Signed   By: Randa Ngo M.D.   On: 12/18/2019 17:20   ECHOCARDIOGRAM COMPLETE  Result Date: 12/19/2019    ECHOCARDIOGRAM REPORT   Patient Name:   LESTINE RAHE Date of Exam: 12/19/2019 Medical Rec #:  [id number]       Height:       65.0 in Accession #:    [id number]      Weight:       198.0 lb Date of Birth:  1970-10-09       BSA:          1.970 m Patient Age:    20 years        BP:           122/84 mmHg Patient Gender: F               HR:           83 bpm. Exam Location:  [facility] Procedure: 2D Echo  Indications:    Syncope 780.2 / R55  History:        Patient has no prior history of Echocardiogram examinations.                 Risk Factors:Non-Smoker and Hypertension. Covid 19 positive.  Sonographer:    Leavy Cella RDCS (AE) Referring Phys: [place]  1. Left ventricular ejection fraction, by estimation, is 65 to 70%. The left ventricle has normal function. The left ventricle has no regional wall motion abnormalities. Left ventricular diastolic parameters were normal.  2. Right ventricular systolic function is normal. The right ventricular size is normal. There is normal pulmonary artery systolic pressure. The estimated right ventricular systolic pressure is 82.9 mmHg.  3. The mitral valve is grossly normal. Trivial mitral valve regurgitation.  4. The aortic valve is tricuspid. Aortic valve regurgitation is not visualized.  5. The inferior vena cava is normal in size with greater than 50% respiratory variability, suggesting right atrial pressure of 3 mmHg. FINDINGS  Left Ventricle: Left ventricular ejection fraction, by estimation, is 65 to 70%. The left ventricle has normal function. The left ventricle has no regional wall motion abnormalities. The left ventricular internal cavity size was normal in size. There is  no left ventricular hypertrophy. Left ventricular diastolic parameters were normal. Right Ventricle: The right ventricular size is normal. No increase in right ventricular wall thickness. Right ventricular systolic function is normal. There is normal pulmonary artery systolic pressure. The tricuspid regurgitant velocity is 1.73 m/s, and  with an assumed right atrial pressure of 3 mmHg, the estimated right ventricular systolic pressure is 56.2 mmHg. Left Atrium: Left atrial size was normal in size. Right Atrium: Right atrial size  was normal in size. Pericardium: There is no evidence of pericardial effusion. Mitral Valve: The mitral valve is grossly normal. Trivial mitral  valve regurgitation. Tricuspid Valve: The tricuspid valve is grossly normal. Tricuspid valve regurgitation is trivial. Aortic Valve: The aortic valve is tricuspid. Aortic valve regurgitation is not visualized. Mild aortic valve annular calcification. Pulmonic Valve: The pulmonic valve was grossly normal. Pulmonic valve regurgitation is trivial. Aorta: The aortic root is normal in size and structure. Venous: The inferior vena cava is normal in size with greater than 50% respiratory variability, suggesting right atrial pressure of 3 mmHg. IAS/Shunts: No atrial level shunt detected by color flow Doppler.  LEFT VENTRICLE PLAX 2D LVIDd:         4.77 cm  Diastology LVIDs:         2.80 cm  LV e' lateral:   10.00 cm/s LV PW:         1.19 cm  LV E/e' lateral: 7.8 LV IVS:        1.02 cm  LV e' medial:    8.38 cm/s LVOT diam:     2.00 cm  LV E/e' medial:  9.3 LVOT Area:     3.14 cm  RIGHT VENTRICLE RV S prime:     11.60 cm/s TAPSE (M-mode): 2.2 cm LEFT ATRIUM             Index       RIGHT ATRIUM           Index LA diam:        3.70 cm 1.88 cm/m  RA Area:     10.40 cm LA Vol (A2C):   43.0 ml 21.83 ml/m RA Volume:   21.60 ml  10.97 ml/m LA Vol (A4C):   40.8 ml 20.71 ml/m LA Biplane Vol: 44.0 ml 22.34 ml/m   AORTA Ao Root diam: 3.10 cm MITRAL VALVE               TRICUSPID VALVE MV Area (PHT): 3.28 cm    TR Peak grad:   12.0 mmHg MV Decel Time: 231 msec    TR Vmax:        173.00 cm/s MV E velocity: 78.00 cm/s MV A velocity: 56.10 cm/s  SHUNTS MV E/A ratio:  1.39        Systemic Diam: 2.00 cm Rozann Lesches MD Electronically signed by Rozann Lesches MD Signature Date/Time: 12/19/2019/10:26:56 AM    Final    VAS [place] LOWER EXTREMITY VENOUS (DVT)  Result Date: 12/25/2019  Lower Venous DVTStudy Indications: Rising D-dimer, Covid +.  Performing Technologist: Antonieta Pert RDMS, RVT  Examination Guidelines: A complete evaluation includes B-mode imaging, spectral Doppler, color Doppler, and power Doppler as needed of all  accessible portions of each vessel. Bilateral testing is considered an integral part of a complete examination. Limited examinations for reoccurring indications may be performed as noted. The reflux portion of the exam is performed with the patient in reverse Trendelenburg.  +---------+---------------+---------+-----------+----------+--------------+ RIGHT    CompressibilityPhasicitySpontaneityPropertiesThrombus Aging +---------+---------------+---------+-----------+----------+--------------+ CFV      Full           Yes      Yes                                 +---------+---------------+---------+-----------+----------+--------------+ SFJ      Full                                                        +---------+---------------+---------+-----------+----------+--------------+  FV Prox  Full                                                        +---------+---------------+---------+-----------+----------+--------------+ FV Mid   Full                                                        +---------+---------------+---------+-----------+----------+--------------+ FV DistalFull                                                        +---------+---------------+---------+-----------+----------+--------------+ PFV      Full                                                        +---------+---------------+---------+-----------+----------+--------------+ POP      Full           Yes      Yes                                 +---------+---------------+---------+-----------+----------+--------------+ PTV      Full                                                        +---------+---------------+---------+-----------+----------+--------------+ PERO     Full                                                        +---------+---------------+---------+-----------+----------+--------------+ GSV      Full                                                         +---------+---------------+---------+-----------+----------+--------------+   +---------+---------------+---------+-----------+----------+--------------+ LEFT     CompressibilityPhasicitySpontaneityPropertiesThrombus Aging +---------+---------------+---------+-----------+----------+--------------+ CFV      Full           Yes      Yes                                 +---------+---------------+---------+-----------+----------+--------------+ SFJ      Full                                                        +---------+---------------+---------+-----------+----------+--------------+  FV Prox  Full                                                        +---------+---------------+---------+-----------+----------+--------------+ FV Mid   Full                                                        +---------+---------------+---------+-----------+----------+--------------+ FV DistalFull                                                        +---------+---------------+---------+-----------+----------+--------------+ PFV      Full                                                        +---------+---------------+---------+-----------+----------+--------------+ POP      Full           Yes      Yes                                 +---------+---------------+---------+-----------+----------+--------------+ PTV      Full                                                        +---------+---------------+---------+-----------+----------+--------------+ PERO     Full                                                        +---------+---------------+---------+-----------+----------+--------------+ GSV      Full                                                        +---------+---------------+---------+-----------+----------+--------------+     Summary: RIGHT: - There is no evidence of deep vein thrombosis in the lower extremity.  - No cystic structure found in  the popliteal fossa.  LEFT: - There is no evidence of deep vein thrombosis in the lower extremity.  - No cystic structure found in the popliteal fossa.  *See table(s) above for measurements and observations. Electronically signed by Harold Barban MD on 12/25/2019 at 5:40:18 PM.    Final

## 2019-12-28 LAB — COMPREHENSIVE METABOLIC PANEL
ALT: 52 U/L — ABNORMAL HIGH (ref 0–44)
AST: 21 U/L (ref 15–41)
Albumin: 2.6 g/dL — ABNORMAL LOW (ref 3.5–5.0)
Alkaline Phosphatase: 64 U/L (ref 38–126)
Anion gap: 9 (ref 5–15)
BUN: 15 mg/dL (ref 6–20)
CO2: 22 mmol/L (ref 22–32)
Calcium: 8.3 mg/dL — ABNORMAL LOW (ref 8.9–10.3)
Chloride: 104 mmol/L (ref 98–111)
Creatinine, Ser: 0.59 mg/dL (ref 0.44–1.00)
GFR calc Af Amer: >60 mL/min (ref >60)
GFR calc non Af Amer: >60 mL/min (ref >60)
Glucose, Bld: 157 mg/dL — ABNORMAL HIGH (ref 70–99)
Potassium: 4.1 mmol/L (ref 3.5–5.1)
Sodium: 135 mmol/L (ref 135–145)
Total Bilirubin: 0.7 mg/dL (ref 0.3–1.2)
Total Protein: 5.5 g/dL — ABNORMAL LOW (ref 6.5–8.1)

## 2019-12-28 LAB — CBC
HCT: 36.7 % (ref 36.0–46.0)
Hemoglobin: 12.6 g/dL (ref 12.0–15.0)
MCH: 30.6 pg (ref 26.0–34.0)
MCHC: 34.3 g/dL (ref 30.0–36.0)
MCV: 89.1 fL (ref 80.0–100.0)
Platelets: 398 10*3/uL (ref 150–400)
RBC: 4.12 MIL/uL (ref 3.87–5.11)
RDW: 13 % (ref 11.5–15.5)
WBC: 9.5 10*3/uL (ref 4.0–10.5)
nRBC: 0 % (ref 0.0–0.2)

## 2019-12-28 LAB — D-DIMER, QUANTITATIVE: D-Dimer, Quant: 1.82 ug/mL-FEU — ABNORMAL HIGH (ref 0.00–0.50)

## 2019-12-28 MED ORDER — ENOXAPARIN SODIUM 40 MG/0.4ML ~~LOC~~ SOLN: 40.0000 mg | SUBCUTANEOUS | Status: DC

## 2019-12-28 MED ORDER — DEXAMETHASONE 6 MG PO TABS: 6.0000 mg | ORAL_TABLET | Freq: Every day | ORAL | 0 refills | Status: AC

## 2019-12-28 NOTE — Progress Notes (Signed)
Theresa [name] to be D/C'd home per MD order. Discussed with the patient and all questions fully answered.   VVS, Skin clean, dry and intact without evidence of skin break down, no evidence of skin tears noted.  IV catheter discontinued intact. Site without signs and symptoms of complications. Dressing and pressure applied.  An After Visit Summary was printed and given to the patient.  Patient escorted via [place], and D/C home via private auto.  Theresa [name]  12/28/2019 6:19 PM

## 2019-12-28 NOTE — Progress Notes (Signed)
SATURATION QUALIFICATIONS: (This note is used to comply with regulatory documentation for home oxygen)  Patient Saturations on Room Air at Rest = 95%  Patient Saturations on Room Air while Ambulating = 96%  Patient Saturations on 0 Liters of oxygen while Ambulating = 0%  Please briefly explain why patient needs home oxygen:

## 2019-12-28 NOTE — Discharge Summary (Signed)
RAMONA RUARK, is a 49 y.o. female  DOB Feb 09, 1971  MRN [id number].  Admission date:  12/21/2019  Admitting Physician  Rolla Plate, DO  Discharge Date:  12/28/2019   Primary MD  Kathyrn Drown, MD  Recommendations for primary care physician for things to follow:  -Please check CBC, CMP during next visit.   Admission Diagnosis  COVID-19 [U07.1]   Discharge Diagnosis  COVID-19 [U07.1]    Principal Problem:   Pneumonia due to COVID-19 virus Active Problems:   Headache   Prediabetes   Gastroesophageal reflux disease without esophagitis   COVID-19   Acute respiratory failure with hypoxia (HCC)      Past Medical History:  Diagnosis Date  . Anxiety   . Depression   . Fibromyalgia   . GERD (gastroesophageal reflux disease)   . Headache(784.0)   . Heart murmur   . Hyperplastic colon polyp 10/28/2019   Dr Sydell Axon recommends repeat colonoscopy in 2028.  Marland Kitchen IFG (impaired fasting glucose)   . Obsessive-compulsive disorder   . Osteoarthritis   . Seizures ([place])    last seizure at age 69-stressed induced seizure.    Past Surgical History:  Procedure Laterality Date  . ABDOMINAL HYSTERECTOMY     still has right ovary  . BIOPSY N/A 03/05/2014   Procedure: DUODENAL AND GASTRIC BIOPSY;  Surgeon: Danie Binder, MD;  Location: [facility];  Service: Endoscopy;  Laterality: N/A;  . bladder sling X 3    . CHOLECYSTECTOMY    . COLONOSCOPY  04/24/2012   QTM:AUQJFHL polyp measuring 5 mm in size was found in the proximal transverse colon; polypectomy was performed with cold forceps Pedunculated polyp measuring 1.1 cm in size was found in the sigmoid colon; polypectomy was performed using snare cautery Moderate diverticulosis was noted in the descending colon and sigmoid colon small internal hemorrhoids. next tcs 04/2017  . COLONOSCOPY WITH PROPOFOL N/A 10/23/2019   Procedure: COLONOSCOPY WITH PROPOFOL;   Surgeon: Daneil Dolin, MD;  Location: [facility];  Service: Endoscopy;  Laterality: N/A;  12:30pm  . ESOPHAGOGASTRODUODENOSCOPY N/A 02/18/2014   Procedure: ESOPHAGOGASTRODUODENOSCOPY (EGD);  Surgeon: Danie Binder, MD;  Location: [facility];  Service: Endoscopy;  Laterality: N/A;  115  . ESOPHAGOGASTRODUODENOSCOPY (EGD) WITH PROPOFOL N/A 03/05/2014   SLF: 1. dyspepsia due to constipation, gastritis, and GERD 2. Multiple Gastric polyps 3. Moderate non-erosive gastritis 4. Diverticulum in the 2nd part of the duodenum  . ESOPHAGOGASTRODUODENOSCOPY (EGD) WITH PROPOFOL N/A 10/23/2019   Procedure: ESOPHAGOGASTRODUODENOSCOPY (EGD) WITH PROPOFOL;  Surgeon: Daneil Dolin, MD;  [facility];  Service: Endoscopy;  Laterality: N/A;  Venia Minks DILATION N/A 10/23/2019   Procedure: Venia Minks DILATION;  Surgeon: Daneil Dolin, MD;  [facility];  Service: Endoscopy;  Laterality: N/A;  . POLYPECTOMY  10/23/2019   Procedure: POLYPECTOMY;  Surgeon: Daneil Dolin, MD;  Location: [facility];  Service: Endoscopy;;  . TUBAL LIGATION         History of present illness  and  Hospital Course:     Kindly see H&P for history of present illness and admission details, please review complete Labs, Consult reports and Test reports for all details in brief  HPI  from the history and physical done on the day of admission 12/21/2019  Nansi Birmingham  is a 49 y.o. female, with history of seizures, osteoarthritis, obsessive-compulsive disorder, heart murmur, GERD, fibromyalgia, depression, anxiety presents to the ED at the recommendation of her PCP.  Patient reports that she had contacted her PCP regarding oxygen sats in the low 70s high 60s at home.  PCP told her that she was at risk of death if she did not return to the ER.  Patient had been in the ER on 12/18/2019, and refused remdesivir at that time.  She reports that she refused it because there is so much misinformation she did not know  anything about the medication and therefore did not want to take it.  She has agreed remdesivir this time.  Patient was diagnosed with Covid on 10 August.  She reports that she has been experiencing shortness of breath, worse with exertion, better with rest.  With exertion she feels presyncopal.  She reports a dry cough.  She had fevers and body aches initially, but they have resolved.  She also had nausea and vomiting last time she was in the ER.  She reports poor p.o. intake.  She has palpitations as well that are worse with exertion, better with rest.  Patient reports that she has not been vaccinated for Covid.  She has been taking Decadron, and inhalers in the outpatient setting.  She reports that the inhaler helps initially, but her symptoms resume after a short interval.  In the ED patient is requiring 35 L high flow nasal cannula to maintain his saturation at 91%.  In the ED Temp 98.3, respiratory rate 39, pulse 62, blood pressure 132/81 satting at 91% on 35 L No leukocytosis with a white blood cell count of 7.3 Basic CHEM is mostly unremarkable with a hyperglycemia of 136 AST 90, ALT 86, albumin 3.0 Covid markers show D-dimer 1.70, fibrinogen 516, lactic acid 1.2, pro-Cal less than 0.10, ferritin 209 Chest x-ray shows worsening bilateral pneumonia Patient started on remdesivir, albuterol, Decadron  Hospital Course       Acute Hypoxic Resp. Failure due to Acute Covid 19 Viral Pneumonitis during the ongoing 2020 Covid 19 Pandemic  - she has severe parenchymal injury with extreme hypoxia on presentation, she was treated on high-dose IV Solu-Medrol, she was treated on IV remdesivir, refused Actemra on admission, she was treated with remdesivir as well, at one point she did require heated high flow nasal cannula, but this has significantly improved, she is currently on room air, tolerating ambulation today in the hallway at 96% on room air with no oxygen requirement, I have discussed with her  to take incentive spirometry and flutter valve at home and keep using it, and she will be discharged another 3 days of oral Decadron.  COVID-19 pneumonia related asymptomatic transaminitis.  Trending down, almost normalized  History of migraine headaches.    Continue with home medications  Obesity - BMI 33, follow with PCP for weight loss.  Extremely high D-dimer could be due to inflammation,  5.8, she was kept on full dose anticoagulation, she was transitioned to DVT prophylaxis dose, had venous Dopplers negative for DVT, no further anticoagulation indicated on discharge.  Marland Kitchen   Discharge Condition:  stable   Follow UP  Follow-up Information    Kathyrn Drown, MD. Go on 01/09/2020.   Specialty: Family Medicine Why: 1100  Contact information: [address] [place] [address] [phone]                 Discharge Instructions  and  Discharge Medications    Discharge Instructions    Discharge instructions   Complete by: As directed    Follow with Primary MD Kathyrn Drown, MD   Get CBC, CMP,  checked  by Primary MD next visit.    Activity: As tolerated with Full fall precautions use walker/cane & assistance as needed   Disposition Home   Diet: Heart Healthy .   On your next visit with your primary care physician please Get Medicines reviewed and adjusted.   Please request your Prim.MD to go over all Hospital Tests and Procedure/Radiological results at the follow up, please get all Hospital records sent to your Prim MD by signing hospital release before you go home.   If you experience worsening of your admission symptoms, develop shortness of breath, life threatening emergency, suicidal or homicidal thoughts you must seek medical attention immediately by calling 911 or calling your MD immediately  if symptoms less severe.  You Must read complete instructions/literature along with all the possible adverse reactions/side effects for all the  Medicines you take and that have been prescribed to you. Take any new Medicines after you have completely understood and accpet all the possible adverse reactions/side effects.   Do not drive, operating heavy machinery, perform activities at heights, swimming or participation in water activities or provide baby sitting services if your were admitted for syncope or siezures until you have seen by Primary MD or a Neurologist and advised to do so again.  Do not drive when taking Pain medications.    Do not take more than prescribed Pain, Sleep and Anxiety Medications  Special Instructions: If you have smoked or chewed Tobacco  in the last 2 yrs please stop smoking, stop any regular Alcohol  and or any Recreational drug use.  Wear Seat belts while driving.   Please note  You were cared for by a hospitalist during your hospital stay. If you have any questions about your discharge medications or the care you received while you were in the hospital after you are discharged, you can call the unit and asked to speak with the hospitalist on call if the hospitalist that took care of you is not available. Once you are discharged, your primary care physician will handle any further medical issues. Please note that NO REFILLS for any discharge medications will be authorized once you are discharged, as it is imperative that you return to your primary care physician (or establish a relationship with a primary care physician if you do not have one) for your aftercare needs so that they can reassess your need for medications and monitor your lab values.   Increase activity slowly   Complete by: As directed      Allergies as of 12/28/2019      Reactions   Fentanyl Other (See Comments)   Doesn't like the way it feels       Medication List    STOP taking these medications   acetaminophen 500 MG tablet Commonly known as: TYLENOL   estradiol 1 MG tablet Commonly known as: ESTRACE   GOODY HEADACHE PO    valACYclovir 1000 MG tablet Commonly known as: VALTREX     TAKE  these medications   albuterol 108 (90 Base) MCG/ACT inhaler Commonly known as: VENTOLIN HFA Inhale 2 puffs into the lungs every 4 (four) hours as needed for wheezing or shortness of breath.   amphetamine-dextroamphetamine 10 MG 24 hr capsule Commonly known as: ADDERALL XR Take 1 capsule (10 mg total) by mouth daily.   ascorbic acid 500 MG tablet Commonly known as: VITAMIN C Take 1 tablet (500 mg total) by mouth daily.   dexamethasone 6 MG tablet Commonly known as: DECADRON Take 1 tablet (6 mg total) by mouth daily for 3 days. Start taking on: December 29, 2019   diazepam 5 MG tablet Commonly known as: VALIUM TAKE 1/2 TO 1 TABLET BY MOUTH ONCE DAILY AS NEEDED FOR ANXIETY What changed: See the new instructions.   guaiFENesin-dextromethorphan 100-10 MG/5ML syrup Commonly known as: ROBITUSSIN DM Take 10 mLs by mouth every 4 (four) hours as needed for cough.   Magnesium 400 MG Caps Take 400 mg by mouth daily.   multivitamin with minerals Tabs tablet Take 1 tablet by mouth daily.   pantoprazole 40 MG tablet Commonly known as: PROTONIX Take 1 tablet (40 mg total) by mouth daily.   promethazine 25 MG tablet Commonly known as: PHENERGAN Take one half to one whole tablet every 8 hours prn. Caution drowiness What changed:   how much to take  how to take this  when to take this  reasons to take this  additional instructions   rizatriptan 10 MG disintegrating tablet Commonly known as: MAXALT-MLT TAKE 1 TABLET AS NEEDED FOR MIGRAIN, MAY REPEAT IN 2 HOURS MAX 2/24 HOURS What changed:   how much to take  how to take this  when to take this  additional instructions   zinc sulfate 220 (50 Zn) MG capsule Take 1 capsule (220 mg total) by mouth daily.         Diet and Activity recommendation: See Discharge Instructions above   Consults obtained -  none   Major procedures and Radiology  Reports - PLEASE review detailed and final reports for all details, in brief -      DG Chest Port 1 View  Result Date: 12/24/2019 CLINICAL DATA:  Shortness of breath, cough, and intermittent oxygen desaturation. COVID-19 positive. EXAM: PORTABLE CHEST 1 VIEW COMPARISON:  12/23/2019 FINDINGS: The cardiomediastinal silhouette is unchanged. Lung volumes are unchanged with persistent mild elevation of the right hemidiaphragm. Interstitial and ill-defined airspace opacities in the left greater than right lungs have not significantly changed. No sizable pleural effusion or pneumothorax is identified. IMPRESSION: Unchanged bilateral lung opacities consistent with known COVID-19 pneumonia. Electronically Signed   By: Logan Bores M.D.   On: 12/24/2019 09:59   DG Chest Port 1 View  Result Date: 12/23/2019 CLINICAL DATA:  COVID-19. EXAM: PORTABLE CHEST 1 VIEW COMPARISON:  December 21, 2019 FINDINGS: Bilateral pulmonary infiltrates are less patchy more diffuse in the interval. The cardiomediastinal silhouette is stable. No pneumothorax. IMPRESSION: The patient's known COVID-19 pneumonia is less patchy and more diffuse in the interval, particularly on the left. No other changes. Electronically Signed   By: Dorise Bullion III M.D   On: 12/23/2019 08:39   DG Chest Port 1 View  Result Date: 12/21/2019 CLINICAL DATA:  COVID-19 positive patient who developed shortness of breath 3 days ago. EXAM: PORTABLE CHEST 1 VIEW COMPARISON:  PA and lateral chest 12/18/2019. FINDINGS: Bilateral airspace disease consistent with pneumonia has markedly worsened. Heart size is normal. No pneumothorax or pleural effusion. No acute or  focal bony abnormality. IMPRESSION: Marked worsening bilateral pneumonia. Electronically Signed   By: Inge Rise M.D.   On: 12/21/2019 20:33   DG Chest Port 1 View  Result Date: 12/18/2019 CLINICAL DATA:  Hypotension, COVID-19 positive, shortness of breath and cough EXAM: PORTABLE CHEST 1 VIEW  COMPARISON:  None. FINDINGS: Single frontal view of the chest demonstrates an unremarkable cardiac silhouette. There is mild diffuse interstitial prominence, with patchy bilateral ground-glass airspace disease consistent with multifocal pneumonia and diagnosis of COVID-19. No effusion or pneumothorax. No acute bony abnormalities. IMPRESSION: 1. Multifocal bilateral pneumonia consistent with COVID 19. Electronically Signed   By: Randa Ngo M.D.   On: 12/18/2019 17:20   ECHOCARDIOGRAM COMPLETE  Result Date: 12/19/2019    ECHOCARDIOGRAM REPORT   Patient Name:   JAMITA MCKELVIN Date of Exam: 12/19/2019 Medical Rec #:  [id number]       Height:       65.0 in Accession #:    [id number]      Weight:       198.0 lb Date of Birth:  [birth year]       BSA:          1.970 m Patient Age:    3 years        BP:           122/84 mmHg Patient Gender: F               HR:           83 bpm. Exam Location:  [facility] Procedure: 2D Echo Indications:    Syncope 780.2 / R55  History:        Patient has no prior history of Echocardiogram examinations.                 Risk Factors:Non-Smoker and Hypertension. Covid 19 positive.  Sonographer:    Leavy Cella RDCS (AE) Referring Phys: [place]  1. Left ventricular ejection fraction, by estimation, is 65 to 70%. The left ventricle has normal function. The left ventricle has no regional wall motion abnormalities. Left ventricular diastolic parameters were normal.  2. Right ventricular systolic function is normal. The right ventricular size is normal. There is normal pulmonary artery systolic pressure. The estimated right ventricular systolic pressure is 02.5 mmHg.  3. The mitral valve is grossly normal. Trivial mitral valve regurgitation.  4. The aortic valve is tricuspid. Aortic valve regurgitation is not visualized.  5. The inferior vena cava is normal in size with greater than 50% respiratory variability, suggesting right atrial pressure of 3 mmHg. FINDINGS   Left Ventricle: Left ventricular ejection fraction, by estimation, is 65 to 70%. The left ventricle has normal function. The left ventricle has no regional wall motion abnormalities. The left ventricular internal cavity size was normal in size. There is  no left ventricular hypertrophy. Left ventricular diastolic parameters were normal. Right Ventricle: The right ventricular size is normal. No increase in right ventricular wall thickness. Right ventricular systolic function is normal. There is normal pulmonary artery systolic pressure. The tricuspid regurgitant velocity is 1.73 m/s, and  with an assumed right atrial pressure of 3 mmHg, the estimated right ventricular systolic pressure is 85.2 mmHg. Left Atrium: Left atrial size was normal in size. Right Atrium: Right atrial size was normal in size. Pericardium: There is no evidence of pericardial effusion. Mitral Valve: The mitral valve is grossly normal. Trivial mitral valve regurgitation. Tricuspid Valve: The tricuspid valve is grossly normal. Tricuspid  valve regurgitation is trivial. Aortic Valve: The aortic valve is tricuspid. Aortic valve regurgitation is not visualized. Mild aortic valve annular calcification. Pulmonic Valve: The pulmonic valve was grossly normal. Pulmonic valve regurgitation is trivial. Aorta: The aortic root is normal in size and structure. Venous: The inferior vena cava is normal in size with greater than 50% respiratory variability, suggesting right atrial pressure of 3 mmHg. IAS/Shunts: No atrial level shunt detected by color flow Doppler.  LEFT VENTRICLE PLAX 2D LVIDd:         4.77 cm  Diastology LVIDs:         2.80 cm  LV e' lateral:   10.00 cm/s LV PW:         1.19 cm  LV E/e' lateral: 7.8 LV IVS:        1.02 cm  LV e' medial:    8.38 cm/s LVOT diam:     2.00 cm  LV E/e' medial:  9.3 LVOT Area:     3.14 cm  RIGHT VENTRICLE RV S prime:     11.60 cm/s TAPSE (M-mode): 2.2 cm LEFT ATRIUM             Index       RIGHT ATRIUM            Index LA diam:        3.70 cm 1.88 cm/m  RA Area:     10.40 cm LA Vol (A2C):   43.0 ml 21.83 ml/m RA Volume:   21.60 ml  10.97 ml/m LA Vol (A4C):   40.8 ml 20.71 ml/m LA Biplane Vol: 44.0 ml 22.34 ml/m   AORTA Ao Root diam: 3.10 cm MITRAL VALVE               TRICUSPID VALVE MV Area (PHT): 3.28 cm    TR Peak grad:   12.0 mmHg MV Decel Time: 231 msec    TR Vmax:        173.00 cm/s MV E velocity: 78.00 cm/s MV A velocity: 56.10 cm/s  SHUNTS MV E/A ratio:  1.39        Systemic Diam: 2.00 cm Rozann Lesches MD Electronically signed by Rozann Lesches MD Signature Date/Time: 12/19/2019/10:26:56 AM    Final    VAS [place] LOWER EXTREMITY VENOUS (DVT)  Result Date: 12/25/2019  Lower Venous DVTStudy Indications: Rising D-dimer, Covid +.  Performing Technologist: Antonieta Pert RDMS, RVT  Examination Guidelines: A complete evaluation includes B-mode imaging, spectral Doppler, color Doppler, and power Doppler as needed of all accessible portions of each vessel. Bilateral testing is considered an integral part of a complete examination. Limited examinations for reoccurring indications may be performed as noted. The reflux portion of the exam is performed with the patient in reverse Trendelenburg.  +---------+---------------+---------+-----------+----------+--------------+ RIGHT    CompressibilityPhasicitySpontaneityPropertiesThrombus Aging +---------+---------------+---------+-----------+----------+--------------+ CFV      Full           Yes      Yes                                 +---------+---------------+---------+-----------+----------+--------------+ SFJ      Full                                                        +---------+---------------+---------+-----------+----------+--------------+  FV Prox  Full                                                        +---------+---------------+---------+-----------+----------+--------------+ FV Mid   Full                                                         +---------+---------------+---------+-----------+----------+--------------+ FV DistalFull                                                        +---------+---------------+---------+-----------+----------+--------------+ PFV      Full                                                        +---------+---------------+---------+-----------+----------+--------------+ POP      Full           Yes      Yes                                 +---------+---------------+---------+-----------+----------+--------------+ PTV      Full                                                        +---------+---------------+---------+-----------+----------+--------------+ PERO     Full                                                        +---------+---------------+---------+-----------+----------+--------------+ GSV      Full                                                        +---------+---------------+---------+-----------+----------+--------------+   +---------+---------------+---------+-----------+----------+--------------+ LEFT     CompressibilityPhasicitySpontaneityPropertiesThrombus Aging +---------+---------------+---------+-----------+----------+--------------+ CFV      Full           Yes      Yes                                 +---------+---------------+---------+-----------+----------+--------------+ SFJ      Full                                                        +---------+---------------+---------+-----------+----------+--------------+  FV Prox  Full                                                        +---------+---------------+---------+-----------+----------+--------------+ FV Mid   Full                                                        +---------+---------------+---------+-----------+----------+--------------+ FV DistalFull                                                         +---------+---------------+---------+-----------+----------+--------------+ PFV      Full                                                        +---------+---------------+---------+-----------+----------+--------------+ POP      Full           Yes      Yes                                 +---------+---------------+---------+-----------+----------+--------------+ PTV      Full                                                        +---------+---------------+---------+-----------+----------+--------------+ PERO     Full                                                        +---------+---------------+---------+-----------+----------+--------------+ GSV      Full                                                        +---------+---------------+---------+-----------+----------+--------------+     Summary: RIGHT: - There is no evidence of deep vein thrombosis in the lower extremity.  - No cystic structure found in the popliteal fossa.  LEFT: - There is no evidence of deep vein thrombosis in the lower extremity.  - No cystic structure found in the popliteal fossa.  *See table(s) above for measurements and observations. Electronically signed by Harold Barban MD on 12/25/2019 at 5:40:18 PM.    Final     Micro Results    Recent Results (from the past 240 hour(s))  Blood Culture (routine x 2)     Status: None   Collection Time: 12/18/19  6:01  PM   Specimen: Left Antecubital; Blood  Result Value Ref Range Status   Specimen Description LEFT ANTECUBITAL  Final   Special Requests   Final    BOTTLES DRAWN AEROBIC AND ANAEROBIC Blood Culture adequate volume   Culture   Final    NO GROWTH 5 DAYS Performed at [facility], [address], [place], [place] [address]    Report Status 12/23/2019 FINAL  Final  SARS Coronavirus 2 by RT PCR (hospital order, performed in [place] hospital lab) Nasopharyngeal Nasopharyngeal Swab     Status: Abnormal   Collection Time: 12/18/19  6:12 PM    Specimen: Nasopharyngeal Swab  Result Value Ref Range Status   SARS Coronavirus 2 POSITIVE (A) NEGATIVE Final    Comment: RESULT CALLED TO, READ BACK BY AND VERIFIED WITH: S WOODS,RN @1956  12/18/19 MKELLY (NOTE) SARS-CoV-2 target nucleic acids are DETECTED  SARS-CoV-2 RNA is generally detectable in upper respiratory specimens  during the acute phase of infection.  Positive results are indicative  of the presence of the identified virus, but do not rule out bacterial infection or co-infection with other pathogens not detected by the test.  Clinical correlation with patient history and  other diagnostic information is necessary to determine patient infection status.  The expected result is negative.  Fact Sheet for Patients:   StrictlyIdeas.no   Fact Sheet for Healthcare Providers:   BankingDealers.co.za    This test is not yet approved or cleared by the [place] FDA and  has been authorized for detection and/or diagnosis of SARS-CoV-2 by FDA under an Emergency Use Authorization (EUA).  This EUA will remain in effect (meaning this test  can be used) for the duration of  the COVID-19 declaration under Section 564(b)(1) of the Act, 21 U.S.C. section [id number](b)(1), unless the authorization is terminated or revoked sooner.  Performed at [facility], [address] [place], [place] [address]   Blood Culture (routine x 2)     Status: None   Collection Time: 12/21/19  8:10 PM   Specimen: Right Antecubital; Blood  Result Value Ref Range Status   Specimen Description RIGHT ANTECUBITAL  Final   Special Requests   Final    BOTTLES DRAWN AEROBIC AND ANAEROBIC Blood Culture adequate volume   Culture   Final    NO GROWTH 5 DAYS Performed at [facility], [address], [place], [place] [address]    Report Status 12/26/2019 FINAL  Final  Blood Culture (routine x 2)     Status: None   Collection Time: 12/21/19  8:15 PM   Specimen:  Left Antecubital; Blood  Result Value Ref Range Status   Specimen Description LEFT ANTECUBITAL  Final   Special Requests   Final    BOTTLES DRAWN AEROBIC AND ANAEROBIC Blood Culture adequate volume   Culture   Final    NO GROWTH 5 DAYS Performed at [facility], [address] [place], [place] [address]    Report Status 12/26/2019 FINAL  Final       Today   Subjective:   Josilynn Losh today has no headache,no chest abdominal pain,no new weakness tingling or numbness, feels much better wants to go home today.   Objective:   Blood pressure 102/73, pulse 87, temperature 98.2 F (36.8 C), temperature source Oral, resp. rate 10, height 5\' 5"  (1.651 m), weight 91.9 kg, SpO2 93 %.   Intake/Output Summary (Last 24 hours) at 12/28/2019 1444 Last data filed at 12/28/2019 0900 Gross per 24 hour  Intake 1070 ml  Output --  Net 1070 ml    Exam Awake Alert, Oriented x 3, No new F.N deficits, Normal affect Symmetrical Chest wall movement, Good air movement bilaterally, CTAB RRR,No Gallops,Rubs or new Murmurs, No Parasternal Heave +ve B.Sounds, Abd Soft, Non tender, No organomegaly appriciated, No rebound -guarding or rigidity. No Cyanosis, Clubbing or edema, No new Rash or bruise  Data Review   CBC w Diff:  Lab Results  Component Value Date   WBC 9.5 12/28/2019   HGB 12.6 12/28/2019   HGB 12.9 05/18/2019   HCT 36.7 12/28/2019   HCT 38.7 05/18/2019   PLT 398 12/28/2019   PLT 275 05/18/2019   LYMPHOPCT 4 12/27/2019   MONOPCT 3 12/27/2019   EOSPCT 0 12/27/2019   BASOPCT 0 12/27/2019    CMP:  Lab Results  Component Value Date   NA 135 12/28/2019   NA 142 12/20/2018   K 4.1 12/28/2019   CL 104 12/28/2019   CO2 22 12/28/2019   BUN 15 12/28/2019   BUN 8 12/20/2018   CREATININE 0.59 12/28/2019   CREATININE 0.80 10/21/2015   PROT 5.5 (L) 12/28/2019   PROT 6.9 05/18/2019   ALBUMIN 2.6 (L) 12/28/2019   ALBUMIN 4.2 05/18/2019   BILITOT 0.7 12/28/2019   BILITOT 0.5  05/18/2019   ALKPHOS 64 12/28/2019   AST 21 12/28/2019   ALT 52 (H) 12/28/2019  .   Total Time in preparing paper work, data evaluation and todays exam - 27 minutes  Phillips Climes M.D on 12/28/2019 at 2:44 PM  [facility]   Office  [phone]

## 2019-12-28 NOTE — Progress Notes (Signed)
SATURATION QUALIFICATIONS: (This note is used to comply with regulatory documentation for home oxygen)  Patient Saturations on Room Air at Rest = 93%  Patient Saturations on Room Air while Ambulating = 87%  Patient Saturations on 2 Liters of oxygen while Ambulating = 90%  Please briefly explain why patient needs home oxygen: Pt requires supplemental oxygen to maintain SpO2 >/88% with activity.  Mabeline Caras, PT, DPT Acute Rehabilitation Services  Pager [phone] Office [phone]

## 2019-12-28 NOTE — Progress Notes (Signed)
Physical Therapy Treatment Patient Details Name: Theresa [name] MRN: [id number] DOB: 1971/02/02 Today's Date: 12/28/2019    History of Present Illness Pt is a 49 y.o. female initially dx with COVID-19 on 12/11/19, now admitted 12/21/19 with severe hypoxia. Workup for acute hypoxic respiratory failure due to acute Covid 19 viral pneumonitis. PMH includes seizures, OA, OCD, fibromyalgia, depression, anxiety.   PT Comments    Pt progressing well with mobility. Moving well, able to perform hallway ambulation at supervision-level to assess O2 needs. SpO2 >/90% on 2L O2 [place] with ambulation (see SATURATIONS QUALIFICATIONS note). Pt hopeful for d/c home today. Reviewed educ re: current conditions, O2 needs, pulse ox monitoring, activity recommendations, positioning, therex, energy conservation, and importance of mobility. If to remain admitted, will continue to follow acutely.   Follow Up Recommendations  Home health PT     Equipment Recommendations  None recommended by PT    Recommendations for Other Services       Precautions / Restrictions Precautions Precautions: None Restrictions Weight Bearing Restrictions: No    Mobility  Bed Mobility Overal bed mobility: Modified Independent             General bed mobility comments: HOB elevated  Transfers Overall transfer level: Independent Equipment used: None Transfers: Sit to/from Stand              Ambulation/Gait Ambulation/Gait assistance: Scientist, forensic (Feet): 250 Feet Assistive device: None Gait Pattern/deviations: Step-through pattern;Decreased stride length   Gait velocity interpretation: 1.31 - 2.62 ft/sec, indicative of limited community ambulator General Gait Details: Stability improving with distance, pt c/o "lightheadness" (BP stable); multiple bouts of ambulation assessing O2 needs. SpO2 down to 87% on RA, maintaining >/90% on 2L O2 [place]   Stairs             Wheelchair Mobility     Modified Rankin (Stroke Patients Only)       Balance Overall balance assessment: Needs assistance Sitting-balance support: No upper extremity supported Sitting balance-Leahy Scale: Good     Standing balance support: No upper extremity supported;During functional activity Standing balance-Leahy Scale: Good               High level balance activites: Side stepping;Backward walking;Direction changes;Turns;Sudden stops;Head turns High Level Balance Comments: No overt instability or LOB with higher level balance tasks            Cognition Arousal/Alertness: Awake/alert Behavior During Therapy: WFL for tasks assessed/performed Overall Cognitive Status: Within Functional Limits for tasks assessed                                 General Comments: WFL for majority of tasks      Exercises      General Comments General comments (skin integrity, edema, etc.): Standing BP 101/70, post-ambulation BP 108/76      Pertinent Vitals/Pain Pain Assessment: No/denies pain    Home Living                      Prior Function            PT Goals (current goals can now be found in the care plan section) Progress towards PT goals: Progressing toward goals    Frequency           PT Plan Current plan remains appropriate    Co-evaluation  AM-PAC PT "6 Clicks" Mobility   Outcome Measure  Help needed turning from your back to your side while in a flat bed without using bedrails?: None Help needed moving from lying on your back to sitting on the side of a flat bed without using bedrails?: None Help needed moving to and from a bed to a chair (including a wheelchair)?: None Help needed standing up from a chair using your arms (e.g., wheelchair or bedside chair)?: None Help needed to walk in hospital room?: None Help needed climbing 3-5 steps with a railing? : None 6 Click Score: 24    End of Session Equipment Utilized During  Treatment: Oxygen Activity Tolerance: Patient tolerated treatment well Patient left: in chair;with call bell/phone within reach Nurse Communication: Mobility status PT Visit Diagnosis: Difficulty in walking, not elsewhere classified (R26.2);Muscle weakness (generalized) (M62.81)     Time: 8527-7824 PT Time Calculation (min) (ACUTE ONLY): 29 min  Charges:  $Gait Training: 8-22 mins $Self Care/Home Management: [place], PT, DPT Acute Rehabilitation Services  Pager [phone] Office [phone]  Theresa [name] 12/28/2019, 11:50 AM

## 2019-12-28 NOTE — Discharge Instructions (Signed)
Person Under Monitoring Name: Theresa [name]  Location: [address] [place] [address]   Infection Prevention Recommendations for Individuals Confirmed to have, or Being Evaluated for, 2019 Novel Coronavirus (COVID-19) Infection Who Receive Care at Home  Individuals who are confirmed to have, or are being evaluated for, COVID-19 should follow the prevention steps below until a healthcare provider or local or state health department says they can return to normal activities.  Stay home except to get medical care You should restrict activities outside your home, except for getting medical care. Do not go to work, school, or public areas, and do not use public transportation or taxis.  Call ahead before visiting your doctor Before your medical appointment, call the healthcare provider and tell them that you have, or are being evaluated for, COVID-19 infection. This will help the healthcare provider's office take steps to keep other people from getting infected. Ask your healthcare provider to call the local or state health department.  Monitor your symptoms Seek prompt medical attention if your illness is worsening (e.g., difficulty breathing). Before going to your medical appointment, call the healthcare provider and tell them that you have, or are being evaluated for, COVID-19 infection. Ask your healthcare provider to call the local or state health department.  Wear a facemask You should wear a facemask that covers your nose and mouth when you are in the same room with other people and when you visit a healthcare provider. People who live with or visit you should also wear a facemask while they are in the same room with you.  Separate yourself from other people in your home As much as possible, you should stay in a different room from other people in your home. Also, you should use a separate bathroom, if available.  Avoid sharing household items You should not share  dishes, drinking glasses, cups, eating utensils, towels, bedding, or other items with other people in your home. After using these items, you should wash them thoroughly with soap and water.  Cover your coughs and sneezes Cover your mouth and nose with a tissue when you cough or sneeze, or you can cough or sneeze into your sleeve. Throw used tissues in a lined trash can, and immediately wash your hands with soap and water for at least 20 seconds or use an alcohol-based hand rub.  Wash your [facility] your hands often and thoroughly with soap and water for at least 20 seconds. You can use an alcohol-based hand sanitizer if soap and water are not available and if your hands are not visibly dirty. Avoid touching your eyes, nose, and mouth with unwashed hands.   Prevention Steps for Caregivers and Household Members of Individuals Confirmed to have, or Being Evaluated for, COVID-19 Infection Being Cared for in the Home  If you live with, or provide care at home for, a person confirmed to have, or being evaluated for, COVID-19 infection please follow these guidelines to prevent infection:  Follow healthcare provider's instructions Make sure that you understand and can help the patient follow any healthcare provider instructions for all care.  Provide for the patient's basic needs You should help the patient with basic needs in the home and provide support for getting groceries, prescriptions, and other personal needs.  Monitor the patient's symptoms If they are getting sicker, call his or her medical provider and tell them that the patient has, or is being evaluated for, COVID-19 infection. This will help the healthcare provider's office  take steps to keep other people from getting infected. Ask the healthcare provider to call the local or state health department.  Limit the number of people who have contact with the patient  If possible, have only one caregiver for the patient.  Other  household members should stay in another home or place of residence. If this is not possible, they should stay  in another room, or be separated from the patient as much as possible. Use a separate bathroom, if available.  Restrict visitors who do not have an essential need to be in the home.  Keep older adults, very young children, and other sick people away from the patient Keep older adults, very young children, and those who have compromised immune systems or chronic health conditions away from the patient. This includes people with chronic heart, lung, or kidney conditions, diabetes, and cancer.  Ensure good ventilation Make sure that shared spaces in the home have good air flow, such as from an air conditioner or an opened window, weather permitting.  Wash your hands often  Wash your hands often and thoroughly with soap and water for at least 20 seconds. You can use an alcohol based hand sanitizer if soap and water are not available and if your hands are not visibly dirty.  Avoid touching your eyes, nose, and mouth with unwashed hands.  Use disposable paper towels to dry your hands. If not available, use dedicated cloth towels and replace them when they become wet.  Wear a facemask and gloves  Wear a disposable facemask at all times in the room and gloves when you touch or have contact with the patient's blood, body fluids, and/or secretions or excretions, such as sweat, saliva, sputum, nasal mucus, vomit, urine, or feces.  Ensure the mask fits over your nose and mouth tightly, and do not touch it during use.  Throw out disposable facemasks and gloves after using them. Do not reuse.  Wash your hands immediately after removing your facemask and gloves.  If your personal clothing becomes contaminated, carefully remove clothing and launder. Wash your hands after handling contaminated clothing.  Place all used disposable facemasks, gloves, and other waste in a lined container before  disposing them with other household waste.  Remove gloves and wash your hands immediately after handling these items.  Do not share dishes, glasses, or other household items with the patient  Avoid sharing household items. You should not share dishes, drinking glasses, cups, eating utensils, towels, bedding, or other items with a patient who is confirmed to have, or being evaluated for, COVID-19 infection.  After the person uses these items, you should wash them thoroughly with soap and water.  Wash laundry thoroughly  Immediately remove and wash clothes or bedding that have blood, body fluids, and/or secretions or excretions, such as sweat, saliva, sputum, nasal mucus, vomit, urine, or feces, on them.  Wear gloves when handling laundry from the patient.  Read and follow directions on labels of laundry or clothing items and detergent. In general, wash and dry with the warmest temperatures recommended on the label.  Clean all areas the individual has used often  Clean all touchable surfaces, such as counters, tabletops, doorknobs, bathroom fixtures, toilets, phones, keyboards, tablets, and bedside tables, every day. Also, clean any surfaces that may have blood, body fluids, and/or secretions or excretions on them.  Wear gloves when cleaning surfaces the patient has come in contact with.  Use a diluted bleach solution (e.g., dilute bleach with 1 part  bleach and 10 parts water) or a household disinfectant with a label that says EPA-registered for coronaviruses. To make a bleach solution at home, add 1 tablespoon of bleach to 1 quart (4 cups) of water. For a larger supply, add  cup of bleach to 1 gallon (16 cups) of water.  Read labels of cleaning products and follow recommendations provided on product labels. Labels contain instructions for safe and effective use of the cleaning product including precautions you should take when applying the product, such as wearing gloves or eye protection  and making sure you have good ventilation during use of the product.  Remove gloves and wash hands immediately after cleaning.  Monitor yourself for signs and symptoms of illness Caregivers and household members are considered close contacts, should monitor their health, and will be asked to limit movement outside of the home to the extent possible. Follow the monitoring steps for close contacts listed on the symptom monitoring form.   ? If you have additional questions, contact your local health department or call the epidemiologist on call at [phone] (available 24/7). ? This guidance is subject to change. For the most up-to-date guidance from [facility], please refer to their website: [email or web address]

## 2019-12-31 ENCOUNTER — Telehealth: Payer: Self-pay | Admitting: Family Medicine

## 2019-12-31 NOTE — Telephone Encounter (Signed)
Patient was in the hospital with severe Covid There is no studies to show that ongoing testing of blood work for D-dimer is helpful Taking a half a aspirin daily may be beneficial to some degree but once again there is no hard studies on this  Please see how the patient is doing in regards to her breathing The follow-up is on 8 September (If we need to see the patient before then it will be at outside visit)

## 2019-12-31 NOTE — Telephone Encounter (Signed)
Patient notified and stated she is on the mend. Patient states she still has the tightness in her chest but no sob, and o2 sats 97% Patient states she is doing her 3 breathing exercises and walking every 15 minutes to try to regain her strength.she wants to return to work as soon as possible.  Patient states she was given a combivent inhaler when she was discharged from the hospital but it wasn't listed on her discharge instruction and haven't started it and wants to know if you want her to take it and if so how many puffs -how many times a day (combivent respules)

## 2019-12-31 NOTE — Telephone Encounter (Signed)
Patient notified and verbalized understanding. 

## 2019-12-31 NOTE — Telephone Encounter (Signed)
The Combivent can be used as needed but it is not mandatory Maximum use 4 times daily

## 2019-12-31 NOTE — Telephone Encounter (Signed)
Pt needs to have blood work done numbers are to high  from blood clotting. Pt was released from hospital on Friday. HFU is scheduled on 09/08. Pt is wanting to know if she has to wait until the 8th does she need to take a Asprin.   Pt call back [phone]

## 2020-01-03 ENCOUNTER — Telehealth: Payer: Self-pay | Admitting: Family Medicine

## 2020-01-03 ENCOUNTER — Ambulatory Visit: Payer: BC Managed Care – PPO | Admitting: Family Medicine

## 2020-01-03 DIAGNOSIS — Z029 Encounter for administrative examinations, unspecified: Secondary | ICD-10-CM

## 2020-01-03 NOTE — Telephone Encounter (Signed)
Patient dropped off FMLA to be completed. In your folder.

## 2020-01-04 NOTE — Telephone Encounter (Signed)
Checking on FMLA form told will call when ready for pick up

## 2020-01-06 NOTE — Telephone Encounter (Signed)
So I am holding this until her office visit on September 8 in order to determine when she can safely return to work so that we do not have to keep filling out the FMLA over and over

## 2020-01-09 ENCOUNTER — Ambulatory Visit (INDEPENDENT_AMBULATORY_CARE_PROVIDER_SITE_OTHER): Payer: BC Managed Care – PPO | Admitting: Family Medicine

## 2020-01-09 ENCOUNTER — Other Ambulatory Visit: Payer: Self-pay

## 2020-01-09 VITALS — BP 118/76 | Ht 65.0 in

## 2020-01-09 DIAGNOSIS — U071 COVID-19: Secondary | ICD-10-CM

## 2020-01-09 DIAGNOSIS — R079 Chest pain, unspecified: Secondary | ICD-10-CM | POA: Diagnosis not present

## 2020-01-09 NOTE — Progress Notes (Signed)
   Subjective:    Patient ID: Theresa [name], female    DOB: April 03, 1971, 49 y.o.   MRN: [id number]  HPI Very nice patient Patient arrives for a follow up from a recent hospitalization for covid pneumonia. Patient states she is feeling better Patient unfortunately has severe Covid She went to the hospital 2 separate times Please review those records regarding x-rays lab work etc. Patient was on oxygen for a significant length of time Was treated with remdesivir Patient is now having significant trouble with brain fog, leg discomfort, fatigue tiredness, lack of stamina, decreased appetite, lack of sense of taste and smell.  She is making slow progress. Review of Systems  Constitutional: Negative for activity change, appetite change and fatigue.  HENT: Negative for congestion.   Respiratory: Negative for cough.   Cardiovascular: Negative for chest pain.  Gastrointestinal: Negative for abdominal pain.  Skin: Negative for color change.  Neurological: Negative for headaches.  Psychiatric/Behavioral: Negative for behavioral problems.       Objective:   Physical Exam  Lungs clear heart regular HEENT benign pulse normal  Patient does not appear in respiratory distress because of her chest pain need to rule out cardiomyopathy need to rule out blood clots likelihood of this is low but does need to be looked into    Assessment & Plan:  Severe Covid Having some symptomatology prolonged after Covid Not quite qualifies as long hauler but is heading in that direction FMLA papers were filled out patient will need to stay out through 15th.  Patient not capable go back to work currently She was encouraged to get the vaccine in 3 months. Because of the chest pain it is recommended for her to do a chest x-ray as well as D-dimer and troponin Hold off on echo currently Hold off on CT angio currently

## 2020-01-10 NOTE — Telephone Encounter (Signed)
Patient picked up forms at her appointment on 9/8

## 2020-01-14 ENCOUNTER — Telehealth: Payer: Self-pay | Admitting: Family Medicine

## 2020-01-14 DIAGNOSIS — R079 Chest pain, unspecified: Secondary | ICD-10-CM

## 2020-01-14 DIAGNOSIS — R0602 Shortness of breath: Secondary | ICD-10-CM

## 2020-01-14 DIAGNOSIS — U071 COVID-19: Secondary | ICD-10-CM | POA: Diagnosis not present

## 2020-01-14 DIAGNOSIS — R7401 Elevation of levels of liver transaminase levels: Secondary | ICD-10-CM | POA: Diagnosis not present

## 2020-01-14 NOTE — Telephone Encounter (Signed)
Pt dropped Blood work chart off for Dr Nicki Reaper placed in his folder.  Pt call back [phone]

## 2020-01-15 LAB — D-DIMER, QUANTITATIVE: D-DIMER: 2.15 mg/L FEU — ABNORMAL HIGH (ref 0.00–0.49)

## 2020-01-15 LAB — BASIC METABOLIC PANEL
BUN/Creatinine Ratio: 14 (ref 9–23)
BUN: 10 mg/dL (ref 6–24)
CO2: 23 mmol/L (ref 20–29)
Calcium: 9.2 mg/dL (ref 8.7–10.2)
Chloride: 105 mmol/L (ref 96–106)
Creatinine, Ser: 0.69 mg/dL (ref 0.57–1.00)
GFR calc Af Amer: 119 mL/min/{1.73_m2} (ref >59)
GFR calc non Af Amer: 103 mL/min/{1.73_m2} (ref >59)
Glucose: 106 mg/dL — ABNORMAL HIGH (ref 65–99)
Potassium: 4.4 mmol/L (ref 3.5–5.2)
Sodium: 142 mmol/L (ref 134–144)

## 2020-01-15 LAB — CBC WITH DIFFERENTIAL/PLATELET
Basophils Absolute: 0 10*3/uL (ref 0.0–0.2)
Basos: 1 %
EOS (ABSOLUTE): 0.5 10*3/uL — ABNORMAL HIGH (ref 0.0–0.4)
Eos: 9 %
Hematocrit: 36.2 % (ref 34.0–46.6)
Hemoglobin: 11.6 g/dL (ref 11.1–15.9)
Immature Grans (Abs): 0 10*3/uL (ref 0.0–0.1)
Immature Granulocytes: 0 %
Lymphocytes Absolute: 1.1 10*3/uL (ref 0.7–3.1)
Lymphs: 23 %
MCH: 30.1 pg (ref 26.6–33.0)
MCHC: 32 g/dL (ref 31.5–35.7)
MCV: 94 fL (ref 79–97)
Monocytes Absolute: 0.5 10*3/uL (ref 0.1–0.9)
Monocytes: 11 %
Neutrophils Absolute: 2.7 10*3/uL (ref 1.4–7.0)
Neutrophils: 56 %
Platelets: 210 10*3/uL (ref 150–450)
RBC: 3.86 x10E6/uL (ref 3.77–5.28)
RDW: 13.7 % (ref 11.7–15.4)
WBC: 4.9 10*3/uL (ref 3.4–10.8)

## 2020-01-15 NOTE — Telephone Encounter (Signed)
Nurses I was able to review over these labs Any particular questions that the patient has regarding these?

## 2020-01-15 NOTE — Telephone Encounter (Signed)
Wants to know Did D dimer come down and did liver enzymes come down and also worried about her sugar. She know she has to wait for steroids to get out of system for sugar to come down. Pt wants to know what she needs to do. Has had a headache for 6 days, wants to know if she can do anything to get back to normal. If there is any vitamins she can take. Pt states she does not need a response til the bloodwork she done yesterday comes back.

## 2020-01-16 ENCOUNTER — Other Ambulatory Visit: Payer: Self-pay | Admitting: *Deleted

## 2020-01-17 LAB — SPECIMEN STATUS REPORT

## 2020-01-17 NOTE — Telephone Encounter (Signed)
Liver enzymes look good Electrolytes look good CBC looks good D-dimer slightly elevated but some individuals can have elevated D-dimer 4 weeks after this type of infection Also some individuals tend to have elevated D-dimers anyways I would recommend repeating the D-dimer in 4 weeks Currently the studies do not show that taking any type of special supplements help the body get rid of side effects of Covid anytime quickly.  It is more than likely something that would just gradually improve over time.  For some individuals this can take weeks.

## 2020-01-17 NOTE — Telephone Encounter (Signed)
Patient notified of results and verbalized understanding. Patient is concerned that the D Dimer has went up since the last time it was checked on 8/27. Was 1.80 and now 2.15. She also wants to let Dr. Nicki Reaper know over the last week there has been a few times where she is sitting still not doing anything and her heart rate jumps up over 140 and she is able to rest and get it back down.

## 2020-01-17 NOTE — Telephone Encounter (Signed)
So if patient is suffering with shortness of breath please order CT scan chest pulmonary embolus protocol  As for the heart rate jumping up and going back down this is a common post Covid issue and can last for quite some time But if patient keeps suffering with this we would want to do EKG and consider referring to cardiology for telemetry It would be smart for the patient to do a follow-up visit somewhere in the next 2 to 3 weeks

## 2020-01-18 ENCOUNTER — Ambulatory Visit (HOSPITAL_COMMUNITY): Payer: BC Managed Care – PPO

## 2020-01-18 ENCOUNTER — Other Ambulatory Visit: Payer: Self-pay | Admitting: *Deleted

## 2020-01-18 ENCOUNTER — Other Ambulatory Visit: Payer: Self-pay

## 2020-01-18 ENCOUNTER — Ambulatory Visit (HOSPITAL_COMMUNITY)
Admission: RE | Admit: 2020-01-18 | Discharge: 2020-01-18 | Disposition: A | Discharge status: Home / Self Care | Payer: BC Managed Care – PPO | Source: Ambulatory Visit | Attending: Family Medicine | Admitting: Family Medicine

## 2020-01-18 ENCOUNTER — Telehealth: Payer: Self-pay | Admitting: Family Medicine

## 2020-01-18 DIAGNOSIS — R0602 Shortness of breath: Secondary | ICD-10-CM | POA: Diagnosis not present

## 2020-01-18 DIAGNOSIS — R079 Chest pain, unspecified: Secondary | ICD-10-CM | POA: Insufficient documentation

## 2020-01-18 MED ORDER — IOHEXOL 350 MG/ML SOLN
100.0000 mL | Freq: Once | INTRAVENOUS | Status: AC | PRN
  Administered 2020-01-18: 100 mL via INTRAVENOUS

## 2020-01-18 MED ORDER — AZITHROMYCIN 250 MG PO TABS: ORAL_TABLET | ORAL | 0 refills | Status: DC

## 2020-01-18 MED ORDER — AZITHROMYCIN 250 MG PO TABS
ORAL_TABLET | ORAL | 0 refills | Status: DC
Start: 2020-01-18 — End: 2020-01-31

## 2020-01-18 NOTE — Telephone Encounter (Signed)
Please advise. Thank you

## 2020-01-18 NOTE — Telephone Encounter (Signed)
Discussed all with pt. Pt states she is having sob off and on. Working on setting up stat ct angio.

## 2020-01-18 NOTE — Telephone Encounter (Signed)
CT Angio Chest requires peer to peer review (CPT [phone])   Please call [phone]  Pt's insurance ID# [id number]

## 2020-01-18 NOTE — Telephone Encounter (Signed)
This was completed please see results

## 2020-01-22 LAB — HEPATIC FUNCTION PANEL
ALT: 29 IU/L (ref 0–32)
AST: 16 IU/L (ref 0–40)
Albumin: 3.9 g/dL (ref 3.8–4.8)
Alkaline Phosphatase: 89 IU/L (ref 44–121)
Bilirubin Total: 0.6 mg/dL (ref 0.0–1.2)
Bilirubin, Direct: 0.13 mg/dL (ref 0.00–0.40)
Total Protein: 6.5 g/dL (ref 6.0–8.5)

## 2020-01-22 LAB — SPECIMEN STATUS REPORT

## 2020-01-23 ENCOUNTER — Encounter: Payer: Self-pay | Admitting: Family Medicine

## 2020-01-23 ENCOUNTER — Other Ambulatory Visit: Payer: Self-pay

## 2020-01-23 ENCOUNTER — Ambulatory Visit (INDEPENDENT_AMBULATORY_CARE_PROVIDER_SITE_OTHER): Payer: BC Managed Care – PPO | Admitting: Family Medicine

## 2020-01-23 VITALS — BP 120/72 | Temp 97.7°F | Ht 65.0 in | Wt 203.8 lb

## 2020-01-23 DIAGNOSIS — U071 COVID-19: Secondary | ICD-10-CM | POA: Diagnosis not present

## 2020-01-23 DIAGNOSIS — R5383 Other fatigue: Secondary | ICD-10-CM | POA: Diagnosis not present

## 2020-01-23 DIAGNOSIS — R079 Chest pain, unspecified: Secondary | ICD-10-CM

## 2020-01-23 DIAGNOSIS — R000 Tachycardia, unspecified: Secondary | ICD-10-CM | POA: Diagnosis not present

## 2020-01-23 DIAGNOSIS — J1282 Pneumonia due to coronavirus disease 2019: Secondary | ICD-10-CM

## 2020-01-23 MED ORDER — ONDANSETRON HCL 8 MG PO TABS: 8.0000 mg | ORAL_TABLET | Freq: Three times a day (TID) | ORAL | 2 refills | Status: DC | PRN

## 2020-01-23 NOTE — Progress Notes (Signed)
   Subjective:    Patient ID: Theresa [name], female    DOB: November 14, 1970, 49 y.o.   MRN: [id number]  HPI  Patient arrives for a follow up on recent hospitalization for covid and hypoxia. Patient no longer on oxygen She is actually doing a better job with her breathing She does find herself feeling fatigued and tired and low on energy  Review of Systems  Constitutional: Negative for activity change, appetite change and fatigue.  HENT: Negative for congestion.   Respiratory: Negative for cough.   Cardiovascular: Negative for chest pain.  Gastrointestinal: Negative for abdominal pain.  Skin: Negative for color change.  Neurological: Negative for headaches.  Psychiatric/Behavioral: Negative for behavioral problems.       Objective:   Physical Exam Vitals reviewed.  Constitutional:      General: She is not in acute distress. HENT:     Head: Normocephalic.  Cardiovascular:     Rate and Rhythm: Normal rate and regular rhythm.     Heart sounds: Normal heart sounds. No murmur heard.   Pulmonary:     Effort: Pulmonary effort is normal.     Breath sounds: Normal breath sounds.  Lymphadenopathy:     Cervical: No cervical adenopathy.  Neurological:     Mental Status: She is alert.  Psychiatric:        Behavior: Behavior normal.           Assessment & Plan:  1. Other fatigue Most likely due to post covid, pt requests labs - T4, free - TSH - Vitamin B12  2. Tachycardia More likely these spells due to post covid issues - T4, free - TSH  3. Chest pain, unspecified type Pt requests to follow her d dimer, I'm not really sure what to do with this issue because this could stay elevated for weeks and some people are always up. Warnings regarding DVT sx discussed Recent ct reviewed - T4, free - TSH - D-dimer, quantitative (not at [facility])  4. Pneumonia due to COVID-19 virus Doing better 02 sat 98

## 2020-01-24 ENCOUNTER — Ambulatory Visit: Payer: BC Managed Care – PPO | Admitting: Nurse Practitioner

## 2020-01-31 ENCOUNTER — Other Ambulatory Visit: Payer: Self-pay

## 2020-01-31 ENCOUNTER — Ambulatory Visit (INDEPENDENT_AMBULATORY_CARE_PROVIDER_SITE_OTHER): Payer: BC Managed Care – PPO | Admitting: Family Medicine

## 2020-01-31 ENCOUNTER — Encounter: Payer: Self-pay | Admitting: Family Medicine

## 2020-01-31 VITALS — BP 132/88 | HR 76 | Temp 98.2°F | Wt 204.4 lb

## 2020-01-31 DIAGNOSIS — J019 Acute sinusitis, unspecified: Secondary | ICD-10-CM

## 2020-01-31 MED ORDER — CEFPROZIL 500 MG PO TABS: 500.0000 mg | ORAL_TABLET | Freq: Two times a day (BID) | ORAL | 0 refills | Status: DC

## 2020-01-31 MED ORDER — PREDNISONE 20 MG PO TABS: ORAL_TABLET | ORAL | 0 refills | Status: DC

## 2020-01-31 NOTE — Progress Notes (Signed)
   Subjective:    Patient ID: Theresa [name], female    DOB: 12-Dec-1970, 49 y.o.   MRN: [id number]  Cough This is a new problem. The current episode started in the past 7 days. Associated symptoms include nasal congestion and rhinorrhea. Pertinent negatives include no chest pain, ear pain, fever, shortness of breath or wheezing.   SOB and coughing up discolored mucus  Review of Systems  Constitutional: Negative for activity change and fever.  HENT: Positive for congestion and rhinorrhea. Negative for ear pain.   Eyes: Negative for discharge.  Respiratory: Positive for cough. Negative for shortness of breath and wheezing.   Cardiovascular: Negative for chest pain.       Objective:   Physical Exam Vitals and nursing note reviewed.  Constitutional:      Appearance: She is well-developed.  HENT:     Head: Normocephalic.     Nose: Nose normal.     Mouth/Throat:     Pharynx: No oropharyngeal exudate.  Cardiovascular:     Rate and Rhythm: Normal rate.     Heart sounds: Normal heart sounds. No murmur heard.   Pulmonary:     Effort: Pulmonary effort is normal.     Breath sounds: Normal breath sounds. No wheezing.  Musculoskeletal:     Cervical back: Neck supple.  Lymphadenopathy:     Cervical: No cervical adenopathy.  Skin:    General: Skin is warm and dry.     She does relate tight cough when she takes a deep breath then she has albuterol she can use but we will go ahead with low-dose prednisone for 5 days      Assessment & Plan:  O2 saturation 97% Lungs clear No sign of pneumonia X-rays lab work not indicated currently Antibiotic sent in for possible sinusitis If progressive troubles or worse follow-up Patient will be doing a follow-up chest x-ray in several weeks She will call us if any setbacks or problems warning signs were discussed in detail

## 2020-01-31 NOTE — Progress Notes (Signed)
   Subjective:    Patient ID: Theresa [name], female    DOB: 21-Jan-1971, 49 y.o.   MRN: [id number]  HPI Patient comes in today for follow up on viral and bacterial pneumonia following covid.  Patient reports SOB, worse at night and coughing up yellow sputum.    Review of Systems     Objective:   Physical Exam        Assessment & Plan:

## 2020-02-04 ENCOUNTER — Telehealth: Payer: Self-pay

## 2020-02-04 ENCOUNTER — Encounter: Payer: Self-pay | Admitting: Family Medicine

## 2020-02-04 ENCOUNTER — Other Ambulatory Visit: Payer: Self-pay | Admitting: *Deleted

## 2020-02-04 ENCOUNTER — Encounter: Payer: Self-pay | Admitting: *Deleted

## 2020-02-04 NOTE — Telephone Encounter (Signed)
Patient notified and verbalized understanding. 

## 2020-02-04 NOTE — Telephone Encounter (Signed)
So I understand what the patient is stating.  We will try to avoid prednisone but there are some situations where prednisone is the only option so she needs to understand that we can mark it as a sensitivity but I would not call this a drug allergy

## 2020-02-04 NOTE — Telephone Encounter (Signed)
Adaya will not be able to take the Prednisone oxygen to 83 on it unable to sleep and muscle aches. Pt has not taken since Friday morning. She felt light headed and could not get her balance. Pt is still taking antibiotic. Pt wants chart update to not put her back on Prednisone.  Pt call back [phone]

## 2020-02-04 NOTE — Telephone Encounter (Signed)
Patient states once she stopped taking the prednisone she has felt better- no more light headedness or low 02 stats. She is still doing breathing exercises and trying to walk around more. Breathing seems to get worse at night but otherwise doing ok.

## 2020-02-08 ENCOUNTER — Encounter: Payer: Self-pay | Admitting: Nurse Practitioner

## 2020-02-08 DIAGNOSIS — R5383 Other fatigue: Secondary | ICD-10-CM | POA: Diagnosis not present

## 2020-02-08 DIAGNOSIS — R000 Tachycardia, unspecified: Secondary | ICD-10-CM | POA: Diagnosis not present

## 2020-02-08 DIAGNOSIS — M79606 Pain in leg, unspecified: Secondary | ICD-10-CM | POA: Diagnosis not present

## 2020-02-08 DIAGNOSIS — L659 Nonscarring hair loss, unspecified: Secondary | ICD-10-CM | POA: Diagnosis not present

## 2020-02-09 LAB — TSH: TSH: 1.32 u[IU]/mL (ref 0.450–4.500)

## 2020-02-09 LAB — T4, FREE: Free T4: 1.01 ng/dL (ref 0.82–1.77)

## 2020-02-09 LAB — D-DIMER, QUANTITATIVE: D-DIMER: 0.49 mg/L FEU (ref 0.00–0.49)

## 2020-02-09 LAB — VITAMIN B12: Vitamin B-12: 642 pg/mL (ref 232–1245)

## 2020-02-15 ENCOUNTER — Other Ambulatory Visit: Payer: Self-pay | Admitting: Nurse Practitioner

## 2020-02-15 ENCOUNTER — Encounter: Payer: Self-pay | Admitting: Nurse Practitioner

## 2020-02-15 MED ORDER — DIAZEPAM 5 MG PO TABS: 2.5000 mg | ORAL_TABLET | Freq: Every day | ORAL | 0 refills | Status: DC | PRN

## 2020-02-15 MED ORDER — AMPHETAMINE-DEXTROAMPHET ER 10 MG PO CP24: 10.0000 mg | ORAL_CAPSULE | Freq: Every day | ORAL | 0 refills | Status: DC

## 2020-02-15 NOTE — Addendum Note (Signed)
Addended by: Dairl Ponder on: 02/15/2020 04:43 PM   Modules accepted: Orders

## 2020-02-18 DIAGNOSIS — M79675 Pain in left toe(s): Secondary | ICD-10-CM | POA: Diagnosis not present

## 2020-02-18 DIAGNOSIS — M205X2 Other deformities of toe(s) (acquired), left foot: Secondary | ICD-10-CM | POA: Diagnosis not present

## 2020-02-20 ENCOUNTER — Other Ambulatory Visit: Payer: Self-pay | Admitting: Podiatry

## 2020-03-05 ENCOUNTER — Telehealth: Payer: Self-pay | Admitting: Family Medicine

## 2020-03-05 MED ORDER — ONDANSETRON HCL 8 MG PO TABS: ORAL_TABLET | ORAL | 3 refills | Status: DC

## 2020-03-05 NOTE — Telephone Encounter (Signed)
Zofran 8 mg tablet, one taken three times daily as needed, #15, three refills

## 2020-03-05 NOTE — Telephone Encounter (Signed)
Zofran sent to pharmacy. Left message to return call

## 2020-03-05 NOTE — Telephone Encounter (Signed)
Left detailed message on pt voicemail (DPR).

## 2020-03-05 NOTE — Telephone Encounter (Signed)
Pt needing something for nausea called into [facility]. Pt states she has been dealing with nausea off and on since having Covid. Pt is having foot surgery next week also. Please advise. Thank you

## 2020-03-07 NOTE — Patient Instructions (Signed)
Theresa [name]  03/07/2020     @PREFPERIOPPHARMACY @   Your procedure is scheduled on  03/12/2020  Report to [facility] at  [place].M.  Call this number if you have problems the morning of surgery:  [phone]   Remember:  Do not eat or drink after midnight.                         Take these medicines the morning of surgery with A SIP OF WATER  Valium(if needed), zofran(if needed), protonix, maxalt(if needed).    Do not wear jewelry, make-up or nail polish.  Do not wear lotions, powders, or perfumes. Please wear deodorant and brush your teeth.  Do not shave 48 hours prior to surgery.  Men may shave face and neck.  Do not bring valuables to the hospital.  [facility] is not responsible for any belongings or valuables.  Contacts, dentures or bridgework may not be worn into surgery.  Leave your suitcase in the car.  After surgery it may be brought to your room.  For patients admitted to the hospital, discharge time will be determined by your treatment team.  Patients discharged the day of surgery will not be allowed to drive home.   Name and phone number of your driver:   family Special instructions:  DO NOT smoke the morning of your procedure.  Please read over the following fact sheets that you were given. Anesthesia Post-op Instructions and Care and Recovery After Surgery       Metatarsal Osteotomy, Care After This sheet gives you information about how to care for yourself after your procedure. Your health care provider may also give you more specific instructions. If you have problems or questions, contact your health care provider. What can I expect after the procedure? After the procedure, it is common to have:  Soreness.  Pain.  Stiffness.  Swelling. Follow these instructions at home: Medicines  Take over-the-counter and prescription medicines only as told by your health care provider.  Ask your health care provider if the medicine  prescribed to you can cause constipation. You may need to take steps to prevent or treat constipation, such as: ? Drink enough fluid to keep your urine pale yellow. ? Take over-the-counter or prescription medicines. ? Eat foods that are high in fiber, such as beans, whole grains, and fresh fruits and vegetables. ? Limit foods that are high in fat and processed sugars, such as fried or sweet foods. If you have a splint or walking boot:  Wear it as told by your health care provider. Remove it only as told by your health care provider.  Loosen it if your toes tingle, become numb, or turn cold and blue.  Keep it clean.  If it is not waterproof: ? Do not let it get wet. ? Cover it with a watertight covering when you take a bath or shower. Bathing  Do not take baths, swim, or use a hot tub until your health care provider approves. Ask your health care provider if you may take showers. You may only be allowed to take sponge baths.  Keep the bandage (dressing) dry. Incision care   Follow instructions from your health care provider about how to take care of your incision. Make sure you: ? Wash your hands with soap and water before you change your bandage (dressing). If soap and water are not available, use hand  sanitizer. ? Change your dressing as told by your health care provider. ? Leave stitches (sutures), skin glue, or adhesive strips in place. These skin closures may need to stay in place for 2 weeks or longer. If adhesive strip edges start to loosen and curl up, you may trim the loose edges. Do not remove adhesive strips completely unless your health care provider tells you to do that.  Check your incision area every day for signs of infection. Check for: ? More redness, swelling, or pain. ? Fluid or blood. ? Warmth. ? Pus or a bad smell. Managing pain, stiffness, and swelling   If directed, put ice on the injured area. ? If you have a removable splint, remove it as told by your  health care provider. ? Put ice in a plastic bag. ? Place a towel between your skin and the bag. ? Leave the ice on for 20 minutes, 2-3 times a day.  Move your toes often to avoid stiffness and to lessen swelling.  Raise (elevate) the injured area above the level of your heart while you are sitting or lying down. Driving  Do not drive or use heavy machinery while taking prescription pain medicine.  Do not drive for 24 hours if you were given a sedative during your procedure.  Ask your health care provider when it is safe to drive if you have a dressing, splint, special shoe, or walking boot on your foot. General instructions  Do not remove the bandage (dressing) around your foot until directed by your health care provider.  If you were given a splint, special shoe, or walking boot, wear it as told by your health care provider.  Do not use the injured limb to support your body weight until your health care provider says that you can. Use crutches or a walker as told by your health care provider.  Return to your normal activities as told by your health care provider. Ask your health care provider what activities are safe for you.  Do not use any products that contain nicotine or tobacco, such as cigarettes, e-cigarettes, and chewing tobacco. These can delay bone healing. If you need help quitting, ask your health care provider.  Keep all follow-up visits as told by your health care provider. This is important. Contact a health care provider if:  You have a fever.  Your dressing becomes wet, loose, or stained with blood or discharge.  You have pus or a bad smell coming from your incision or bandage.  Your foot becomes red, swollen, or tender.  You have pain or stiffness that does not get better or gets worse.  You have tingling or numbness in your foot that does not get better or gets worse. Get help right away if:  You develop a warm and tender swelling in your leg.  You  have chest pain.  You have trouble breathing. Summary  After the procedure, it is common to have soreness, pain, stiffness, and swelling.  Follow instructions on caring for your incision, changing your dressing, and using weight support to protect your foot.  Contact your health care provider if you have a fever, pus or a bad smell coming from your wound or dressing, or pain and stiffness do not get better.  Get help right away if you develop a warm and tender swelling in your leg, have chest pain, or trouble breathing. This information is not intended to replace advice given to you by your health care provider. Make sure  you discuss any questions you have with your health care provider. Document Revised: 03/03/2018 Document Reviewed: 03/03/2018 Elsevier Patient Education  2020 [place] After These instructions provide you with information about caring for yourself after your procedure. Your health care provider may also give you more specific instructions. Your treatment has been planned according to current medical practices, but problems sometimes occur. Call your health care provider if you have any problems or questions after your procedure. What can I expect after the procedure? After your procedure, you may:  Feel sleepy for several hours.  Feel clumsy and have poor balance for several hours.  Feel forgetful about what happened after the procedure.  Have poor judgment for several hours.  Feel nauseous or vomit.  Have a sore throat if you had a breathing tube during the procedure. Follow these instructions at home: For at least 24 hours after the procedure:      Have a responsible adult stay with you. It is important to have someone help care for you until you are awake and alert.  Rest as needed.  Do not: ? Participate in activities in which you could fall or become injured. ? Drive. ? Use heavy machinery. ? Drink  alcohol. ? Take sleeping pills or medicines that cause drowsiness. ? Make important decisions or sign legal documents. ? Take care of children on your own. Eating and drinking  Follow the diet that is recommended by your health care provider.  If you vomit, drink water, juice, or soup when you can drink without vomiting.  Make sure you have little or no nausea before eating solid foods. General instructions  Take over-the-counter and prescription medicines only as told by your health care provider.  If you have sleep apnea, surgery and certain medicines can increase your risk for breathing problems. Follow instructions from your health care provider about wearing your sleep device: ? Anytime you are sleeping, including during daytime naps. ? While taking prescription pain medicines, sleeping medicines, or medicines that make you drowsy.  If you smoke, do not smoke without supervision.  Keep all follow-up visits as told by your health care provider. This is important. Contact a health care provider if:  You keep feeling nauseous or you keep vomiting.  You feel light-headed.  You develop a rash.  You have a fever. Get help right away if:  You have trouble breathing. Summary  For several hours after your procedure, you may feel sleepy and have poor judgment.  Have a responsible adult stay with you for at least 24 hours or until you are awake and alert. This information is not intended to replace advice given to you by your health care provider. Make sure you discuss any questions you have with your health care provider. Document Revised: 07/18/2017 Document Reviewed: 08/10/2015 Elsevier Patient Education  [place]. How to Use Chlorhexidine for Bathing Chlorhexidine gluconate (CHG) is a germ-killing (antiseptic) solution that is used to clean the skin. It can get rid of the bacteria that normally live on the skin and can keep them away for about 24 hours. To clean  your skin with CHG, you may be given:  A CHG solution to use in the shower or as part of a sponge bath.  A prepackaged cloth that contains CHG. Cleaning your skin with CHG may help lower the risk for infection:  While you are staying in the intensive care unit of the hospital.  If you have a  vascular access, such as a central line, to provide short-term or long-term access to your veins.  If you have a catheter to drain urine from your bladder.  If you are on a ventilator. A ventilator is a machine that helps you breathe by moving air in and out of your lungs.  After surgery. What are the risks? Risks of using CHG include:  A skin reaction.  Hearing loss, if CHG gets in your ears.  Eye injury, if CHG gets in your eyes and is not rinsed out.  The CHG product catching fire. Make sure that you avoid smoking and flames after applying CHG to your skin. Do not use CHG:  If you have a chlorhexidine allergy or have previously reacted to chlorhexidine.  On babies younger than 29 months of age. How to use CHG solution  Use CHG only as told by your health care provider, and follow the instructions on the label.  Use the full amount of CHG as directed. Usually, this is one bottle. During a shower Follow these steps when using CHG solution during a shower (unless your health care provider gives you different instructions): 1. Start the shower. 2. Use your normal soap and shampoo to wash your face and hair. 3. Turn off the shower or move out of the shower stream. 4. Pour the CHG onto a clean washcloth. Do not use any type of brush or rough-edged sponge. 5. Starting at your neck, lather your body down to your toes. Make sure you follow these instructions: ? If you will be having surgery, pay special attention to the part of your body where you will be having surgery. Scrub this area for at least 1 minute. ? Do not use CHG on your head or face. If the solution gets into your ears or eyes,  rinse them well with water. ? Avoid your genital area. ? Avoid any areas of skin that have broken skin, cuts, or scrapes. ? Scrub your back and under your arms. Make sure to wash skin folds. 6. Let the lather sit on your skin for 1-2 minutes or as long as told by your health care provider. 7. Thoroughly rinse your entire body in the shower. Make sure that all body creases and crevices are rinsed well. 8. Dry off with a clean towel. Do not put any substances on your body afterward--such as powder, lotion, or perfume--unless you are told to do so by your health care provider. Only use lotions that are recommended by the manufacturer. 9. Put on clean clothes or pajamas. 10. If it is the night before your surgery, sleep in clean sheets.  During a sponge bath Follow these steps when using CHG solution during a sponge bath (unless your health care provider gives you different instructions): 1. Use your normal soap and shampoo to wash your face and hair. 2. Pour the CHG onto a clean washcloth. 3. Starting at your neck, lather your body down to your toes. Make sure you follow these instructions: ? If you will be having surgery, pay special attention to the part of your body where you will be having surgery. Scrub this area for at least 1 minute. ? Do not use CHG on your head or face. If the solution gets into your ears or eyes, rinse them well with water. ? Avoid your genital area. ? Avoid any areas of skin that have broken skin, cuts, or scrapes. ? Scrub your back and under your arms. Make sure to wash skin  folds. 4. Let the lather sit on your skin for 1-2 minutes or as long as told by your health care provider. 5. Using a different clean, wet washcloth, thoroughly rinse your entire body. Make sure that all body creases and crevices are rinsed well. 6. Dry off with a clean towel. Do not put any substances on your body afterward--such as powder, lotion, or perfume--unless you are told to do so by your  health care provider. Only use lotions that are recommended by the manufacturer. 7. Put on clean clothes or pajamas. 8. If it is the night before your surgery, sleep in clean sheets. How to use CHG prepackaged cloths  Only use CHG cloths as told by your health care provider, and follow the instructions on the label.  Use the CHG cloth on clean, dry skin.  Do not use the CHG cloth on your head or face unless your health care provider tells you to.  When washing with the CHG cloth: ? Avoid your genital area. ? Avoid any areas of skin that have broken skin, cuts, or scrapes. Before surgery Follow these steps when using a CHG cloth to clean before surgery (unless your health care provider gives you different instructions): 1. Using the CHG cloth, vigorously scrub the part of your body where you will be having surgery. Scrub using a back-and-forth motion for 3 minutes. The area on your body should be completely wet with CHG when you are done scrubbing. 2. Do not rinse. Discard the cloth and let the area air-dry. Do not put any substances on the area afterward, such as powder, lotion, or perfume. 3. Put on clean clothes or pajamas. 4. If it is the night before your surgery, sleep in clean sheets.  For general bathing Follow these steps when using CHG cloths for general bathing (unless your health care provider gives you different instructions). 1. Use a separate CHG cloth for each area of your body. Make sure you wash between any folds of skin and between your fingers and toes. Wash your body in the following order, switching to a new cloth after each step: ? The front of your neck, shoulders, and chest. ? Both of your arms, under your arms, and your hands. ? Your stomach and groin area, avoiding the genitals. ? Your right leg and foot. ? Your left leg and foot. ? The back of your neck, your back, and your buttocks. 2. Do not rinse. Discard the cloth and let the area air-dry. Do not put any  substances on your body afterward--such as powder, lotion, or perfume--unless you are told to do so by your health care provider. Only use lotions that are recommended by the manufacturer. 3. Put on clean clothes or pajamas. Contact a health care provider if:  Your skin gets irritated after scrubbing.  You have questions about using your solution or cloth. Get help right away if:  Your eyes become very red or swollen.  Your eyes itch badly.  Your skin itches badly and is red or swollen.  Your hearing changes.  You have trouble seeing.  You have swelling or tingling in your mouth or throat.  You have trouble breathing.  You swallow any chlorhexidine. Summary  Chlorhexidine gluconate (CHG) is a germ-killing (antiseptic) solution that is used to clean the skin. Cleaning your skin with CHG may help to lower your risk for infection.  You may be given CHG to use for bathing. It may be in a bottle or in a prepackaged  cloth to use on your skin. Carefully follow your health care provider's instructions and the instructions on the product label.  Do not use CHG if you have a chlorhexidine allergy.  Contact your health care provider if your skin gets irritated after scrubbing. This information is not intended to replace advice given to you by your health care provider. Make sure you discuss any questions you have with your health care provider. Document Revised: 07/06/2018 Document Reviewed: 03/17/2017 Elsevier Patient Education  [place].

## 2020-03-10 ENCOUNTER — Ambulatory Visit (HOSPITAL_COMMUNITY)
Admission: RE | Admit: 2020-03-10 | Discharge: 2020-03-10 | Disposition: A | Discharge status: Home / Self Care | Payer: BC Managed Care – PPO | Source: Ambulatory Visit | Attending: Podiatry | Admitting: Podiatry

## 2020-03-10 ENCOUNTER — Other Ambulatory Visit (HOSPITAL_COMMUNITY)
Admission: RE | Admit: 2020-03-10 | Discharge: 2020-03-10 | Disposition: A | Discharge status: Home / Self Care | Payer: BC Managed Care – PPO | Source: Ambulatory Visit | Attending: Podiatry | Admitting: Podiatry

## 2020-03-10 ENCOUNTER — Encounter (HOSPITAL_COMMUNITY)
Admission: RE | Admit: 2020-03-10 | Discharge: 2020-03-10 | Disposition: A | Discharge status: Home / Self Care | Payer: BC Managed Care – PPO | Source: Ambulatory Visit | Attending: Podiatry | Admitting: Podiatry

## 2020-03-10 ENCOUNTER — Encounter (HOSPITAL_COMMUNITY): Payer: Self-pay

## 2020-03-10 ENCOUNTER — Ambulatory Visit (HOSPITAL_COMMUNITY): Payer: BC Managed Care – PPO

## 2020-03-10 ENCOUNTER — Other Ambulatory Visit: Payer: Self-pay

## 2020-03-10 DIAGNOSIS — G8929 Other chronic pain: Secondary | ICD-10-CM | POA: Insufficient documentation

## 2020-03-10 DIAGNOSIS — M205X2 Other deformities of toe(s) (acquired), left foot: Secondary | ICD-10-CM | POA: Diagnosis not present

## 2020-03-10 DIAGNOSIS — Z79899 Other long term (current) drug therapy: Secondary | ICD-10-CM | POA: Diagnosis not present

## 2020-03-10 DIAGNOSIS — M19072 Primary osteoarthritis, left ankle and foot: Secondary | ICD-10-CM | POA: Insufficient documentation

## 2020-03-10 DIAGNOSIS — Z8616 Personal history of COVID-19: Secondary | ICD-10-CM | POA: Diagnosis not present

## 2020-03-10 DIAGNOSIS — M79675 Pain in left toe(s): Secondary | ICD-10-CM | POA: Diagnosis not present

## 2020-03-10 DIAGNOSIS — Z01818 Encounter for other preprocedural examination: Secondary | ICD-10-CM | POA: Insufficient documentation

## 2020-03-10 DIAGNOSIS — Z791 Long term (current) use of non-steroidal anti-inflammatories (NSAID): Secondary | ICD-10-CM | POA: Diagnosis not present

## 2020-03-10 DIAGNOSIS — Z882 Allergy status to sulfonamides status: Secondary | ICD-10-CM | POA: Diagnosis not present

## 2020-03-10 DIAGNOSIS — Z20822 Contact with and (suspected) exposure to covid-19: Secondary | ICD-10-CM | POA: Insufficient documentation

## 2020-03-11 LAB — SARS CORONAVIRUS 2 (TAT 6-24 HRS): SARS Coronavirus 2: NEGATIVE

## 2020-03-12 ENCOUNTER — Ambulatory Visit (HOSPITAL_COMMUNITY): Payer: BC Managed Care – PPO

## 2020-03-12 ENCOUNTER — Encounter (HOSPITAL_COMMUNITY): Payer: Self-pay

## 2020-03-12 ENCOUNTER — Other Ambulatory Visit: Payer: Self-pay

## 2020-03-12 ENCOUNTER — Ambulatory Visit (HOSPITAL_COMMUNITY): Payer: BC Managed Care – PPO | Admitting: Anesthesiology

## 2020-03-12 ENCOUNTER — Ambulatory Visit (HOSPITAL_COMMUNITY)
Admission: RE | Admit: 2020-03-12 | Discharge: 2020-03-12 | Disposition: A | Discharge status: Home / Self Care | Payer: BC Managed Care – PPO | Attending: Podiatry | Admitting: Podiatry

## 2020-03-12 ENCOUNTER — Encounter (HOSPITAL_COMMUNITY)
Admission: RE | Disposition: A | Discharge status: Home / Self Care | Payer: Self-pay | Source: Home / Self Care | Attending: Podiatry

## 2020-03-12 DIAGNOSIS — M79675 Pain in left toe(s): Secondary | ICD-10-CM | POA: Diagnosis not present

## 2020-03-12 DIAGNOSIS — Z79899 Other long term (current) drug therapy: Secondary | ICD-10-CM | POA: Insufficient documentation

## 2020-03-12 DIAGNOSIS — M19072 Primary osteoarthritis, left ankle and foot: Secondary | ICD-10-CM | POA: Diagnosis not present

## 2020-03-12 DIAGNOSIS — Z791 Long term (current) use of non-steroidal anti-inflammatories (NSAID): Secondary | ICD-10-CM | POA: Diagnosis not present

## 2020-03-12 DIAGNOSIS — M205X2 Other deformities of toe(s) (acquired), left foot: Secondary | ICD-10-CM | POA: Insufficient documentation

## 2020-03-12 DIAGNOSIS — Z8616 Personal history of COVID-19: Secondary | ICD-10-CM | POA: Diagnosis not present

## 2020-03-12 DIAGNOSIS — Z20822 Contact with and (suspected) exposure to covid-19: Secondary | ICD-10-CM | POA: Diagnosis not present

## 2020-03-12 DIAGNOSIS — Z9889 Other specified postprocedural states: Secondary | ICD-10-CM

## 2020-03-12 DIAGNOSIS — M7989 Other specified soft tissue disorders: Secondary | ICD-10-CM | POA: Diagnosis not present

## 2020-03-12 DIAGNOSIS — Z882 Allergy status to sulfonamides status: Secondary | ICD-10-CM | POA: Diagnosis not present

## 2020-03-12 HISTORY — PX: CHILECTOMY: SHX6287

## 2020-03-12 SURGERY — CHILECTOMY
Anesthesia: General | Laterality: Left

## 2020-03-12 MED ORDER — PROPOFOL 10 MG/ML IV BOLUS
INTRAVENOUS | Status: DC | PRN
  Administered 2020-03-12: 30 mg via INTRAVENOUS
  Administered 2020-03-12 (×3): 20 mg via INTRAVENOUS
  Administered 2020-03-12: 30 mg via INTRAVENOUS
  Administered 2020-03-12 (×2): 20 mg via INTRAVENOUS
  Administered 2020-03-12: 30 mg via INTRAVENOUS
  Administered 2020-03-12: 20 mg via INTRAVENOUS

## 2020-03-12 MED ORDER — BUPIVACAINE HCL (PF) 0.5 % IJ SOLN
INTRAMUSCULAR | Status: AC
  Filled 2020-03-12: qty 30

## 2020-03-12 MED ORDER — FENTANYL CITRATE (PF) 100 MCG/2ML IJ SOLN
INTRAMUSCULAR | Status: AC
  Filled 2020-03-12: qty 2

## 2020-03-12 MED ORDER — HYDROMORPHONE HCL 1 MG/ML IJ SOLN
0.2500 mg | INTRAMUSCULAR | Status: DC | PRN
  Administered 2020-03-12: 0.5 mg via INTRAVENOUS
  Filled 2020-03-12: qty 0.5

## 2020-03-12 MED ORDER — ORAL CARE MOUTH RINSE: 15.0000 mL | Freq: Once | OROMUCOSAL | Status: AC

## 2020-03-12 MED ORDER — ONDANSETRON HCL 4 MG/2ML IJ SOLN
INTRAMUSCULAR | Status: DC | PRN
  Administered 2020-03-12: 4 mg via INTRAVENOUS

## 2020-03-12 MED ORDER — 0.9 % SODIUM CHLORIDE (POUR BTL) OPTIME
TOPICAL | Status: DC | PRN
  Administered 2020-03-12: 1000 mL

## 2020-03-12 MED ORDER — MIDAZOLAM HCL 2 MG/2ML IJ SOLN
INTRAMUSCULAR | Status: AC
  Filled 2020-03-12: qty 2

## 2020-03-12 MED ORDER — LACTATED RINGERS IV SOLN: INTRAVENOUS | Status: DC | PRN

## 2020-03-12 MED ORDER — LACTATED RINGERS IV SOLN
Freq: Once | INTRAVENOUS | Status: AC
  Administered 2020-03-12: 1000 mL via INTRAVENOUS

## 2020-03-12 MED ORDER — ONDANSETRON HCL 4 MG/2ML IJ SOLN
INTRAMUSCULAR | Status: AC
  Filled 2020-03-12: qty 2

## 2020-03-12 MED ORDER — MIDAZOLAM HCL 5 MG/5ML IJ SOLN
INTRAMUSCULAR | Status: DC | PRN
  Administered 2020-03-12: 2 mg via INTRAVENOUS

## 2020-03-12 MED ORDER — LIDOCAINE HCL (CARDIAC) PF 100 MG/5ML IV SOSY
PREFILLED_SYRINGE | INTRAVENOUS | Status: DC | PRN
  Administered 2020-03-12: 50 mg via INTRATRACHEAL

## 2020-03-12 MED ORDER — PROPOFOL 10 MG/ML IV BOLUS
INTRAVENOUS | Status: AC
  Filled 2020-03-12: qty 20

## 2020-03-12 MED ORDER — PROPOFOL 500 MG/50ML IV EMUL
INTRAVENOUS | Status: DC | PRN
  Administered 2020-03-12: 100 ug/kg/min via INTRAVENOUS

## 2020-03-12 MED ORDER — CEFAZOLIN SODIUM-DEXTROSE 2-4 GM/100ML-% IV SOLN
2.0000 g | INTRAVENOUS | Status: DC
  Filled 2020-03-12: qty 100

## 2020-03-12 MED ORDER — CHLORHEXIDINE GLUCONATE 0.12 % MT SOLN
15.0000 mL | Freq: Once | OROMUCOSAL | Status: AC
  Administered 2020-03-12: 15 mL via OROMUCOSAL

## 2020-03-12 MED ORDER — PROPOFOL 10 MG/ML IV BOLUS
INTRAVENOUS | Status: AC
  Filled 2020-03-12: qty 40

## 2020-03-12 MED ORDER — LIDOCAINE HCL (PF) 1 % IJ SOLN
INTRAMUSCULAR | Status: DC | PRN
  Administered 2020-03-12: 7 mL via INTRADERMAL

## 2020-03-12 MED ORDER — CHLORHEXIDINE GLUCONATE CLOTH 2 % EX PADS: 6.0000 | MEDICATED_PAD | Freq: Once | CUTANEOUS | Status: DC

## 2020-03-12 MED ORDER — BUPIVACAINE HCL (PF) 0.5 % IJ SOLN
INTRAMUSCULAR | Status: DC | PRN
  Administered 2020-03-12: 7 mL

## 2020-03-12 MED ORDER — LIDOCAINE HCL (PF) 1 % IJ SOLN
INTRAMUSCULAR | Status: AC
  Filled 2020-03-12: qty 30

## 2020-03-12 MED ORDER — PROMETHAZINE HCL 25 MG/ML IJ SOLN: 6.2500 mg | INTRAMUSCULAR | Status: DC | PRN

## 2020-03-12 SURGICAL SUPPLY — 51 items
APL SKNCLS STERI-STRIP NONHPOA (GAUZE/BANDAGES/DRESSINGS) ×1
BANDAGE ELASTIC 4 VELCRO NS (GAUZE/BANDAGES/DRESSINGS) ×2 IMPLANT
BANDAGE ESMARK 4X12 BL STRL LF (DISPOSABLE) ×1 IMPLANT
BENZOIN TINCTURE PRP APPL 2/3 (GAUZE/BANDAGES/DRESSINGS) ×2 IMPLANT
BLADE AVERAGE 25X9 (BLADE) ×4 IMPLANT
BLADE SURG 15 STRL LF DISP TIS (BLADE) ×2 IMPLANT
BLADE SURG 15 STRL SS (BLADE) ×4
BNDG CMPR 12X4 ELC STRL LF (DISPOSABLE) ×1
BNDG CMPR STD VLCR NS LF 5.8X4 (GAUZE/BANDAGES/DRESSINGS) ×1
BNDG CONFORM 2 STRL LF (GAUZE/BANDAGES/DRESSINGS) ×2 IMPLANT
BNDG ELASTIC 4X5.8 VLCR NS LF (GAUZE/BANDAGES/DRESSINGS) ×2 IMPLANT
BNDG ESMARK 4X12 BLUE STRL LF (DISPOSABLE) ×2
BNDG GAUZE ELAST 4 BULKY (GAUZE/BANDAGES/DRESSINGS) ×2 IMPLANT
BOOT STEPPER DURA LG (SOFTGOODS) IMPLANT
BOOT STEPPER DURA MED (SOFTGOODS) IMPLANT
BOOT STEPPER DURA SM (SOFTGOODS) IMPLANT
BOOT STEPPER DURA XLG (SOFTGOODS) IMPLANT
CLOTH BEACON ORANGE TIMEOUT ST (SAFETY) ×2 IMPLANT
COVER LIGHT HANDLE STERIS (MISCELLANEOUS) ×4 IMPLANT
COVER WAND RF STERILE (DRAPES) ×2 IMPLANT
CUFF TOURN SGL QUICK 18X4 (TOURNIQUET CUFF) IMPLANT
DECANTER SPIKE VIAL GLASS SM (MISCELLANEOUS) ×2 IMPLANT
DRAPE OEC MINIVIEW 54X84 (DRAPES) ×2 IMPLANT
DRSG ADAPTIC 3X8 NADH LF (GAUZE/BANDAGES/DRESSINGS) ×2 IMPLANT
DURAPREP 26ML APPLICATOR (WOUND CARE) ×2 IMPLANT
ELECT REM PT RETURN 9FT ADLT (ELECTROSURGICAL) ×2
ELECTRODE REM PT RTRN 9FT ADLT (ELECTROSURGICAL) ×1 IMPLANT
GAUZE SPONGE 4X4 12PLY STRL (GAUZE/BANDAGES/DRESSINGS) ×2 IMPLANT
GLOVE BIOGEL PI IND STRL 7.0 (GLOVE) ×2 IMPLANT
GLOVE BIOGEL PI IND STRL 7.5 (GLOVE) ×1 IMPLANT
GLOVE BIOGEL PI INDICATOR 7.0 (GLOVE) ×2
GLOVE BIOGEL PI INDICATOR 7.5 (GLOVE) ×1
GLOVE ECLIPSE 7.0 STRL STRAW (GLOVE) ×2 IMPLANT
GOWN STRL REUS W/ TWL LRG LVL3 (GOWN DISPOSABLE) ×1 IMPLANT
GOWN STRL REUS W/TWL LRG LVL3 (GOWN DISPOSABLE) ×6 IMPLANT
K-WIRE 6 (WIRE) ×2
KWIRE 6 (WIRE) ×1 IMPLANT
MANIFOLD NEPTUNE II (INSTRUMENTS) ×2 IMPLANT
NEEDLE HYPO 27GX1-1/4 (NEEDLE) ×6 IMPLANT
NS IRRIG 1000ML POUR BTL (IV SOLUTION) ×2 IMPLANT
PACK BASIC LIMB (CUSTOM PROCEDURE TRAY) ×2 IMPLANT
PAD ARMBOARD 7.5X6 YLW CONV (MISCELLANEOUS) ×2 IMPLANT
RASP SM TEAR CROSS CUT (RASP) ×2 IMPLANT
SET BASIN LINEN APH (SET/KITS/TRAYS/PACK) ×4 IMPLANT
SPONGE LAP 18X18 RF (DISPOSABLE) ×2 IMPLANT
STRIP CLOSURE SKIN 1/2X4 (GAUZE/BANDAGES/DRESSINGS) ×2 IMPLANT
SUT PROLENE 4 0 PS 2 18 (SUTURE) ×2 IMPLANT
SUT VIC AB 2-0 CT2 27 (SUTURE) ×2 IMPLANT
SUT VIC AB 4-0 PS2 27 (SUTURE) ×4 IMPLANT
SUT VICRYL AB 3-0 FS1 BRD 27IN (SUTURE) ×4 IMPLANT
SYR CONTROL 10ML LL (SYRINGE) ×4 IMPLANT

## 2020-03-12 NOTE — Transfer of Care (Signed)
Immediate Anesthesia Transfer of Care Note  Patient: Theresa [name]  Procedure(s) Performed: CHILECTOMY HALLUX (Left )  Patient Location: PACU  Anesthesia Type:General  Level of Consciousness: awake  Airway & Oxygen Therapy: Patient Spontanous Breathing  Post-op Assessment: Report given to RN  Post vital signs: Reviewed and stable  Last Vitals:  Vitals Value Taken Time  BP 127/87 03/12/20 0852  Temp    Pulse 69 03/12/20 0856  Resp 19 03/12/20 0856  SpO2 100 % 03/12/20 0856  Vitals shown include unvalidated device data.  Last Pain:  Vitals:   03/12/20 0707  TempSrc: Oral  PainSc:       Patients Stated Pain Goal: 8 (58/94/83 [id number])  Complications: No complications documented.

## 2020-03-12 NOTE — Anesthesia Preprocedure Evaluation (Signed)
Anesthesia Evaluation  Patient identified by MRN, date of birth, ID band Patient awake    Reviewed: Allergy & Precautions, NPO status , Patient's Chart, lab work & pertinent test results  History of Anesthesia Complications Negative for: history of anesthetic complications  Airway Mallampati: II  TM Distance: >3 FB Neck ROM: Full    Dental  (+) Dental Advisory Given, Caps   Pulmonary pneumonia (COVID19 - 2 months ago, was in ICU, was on high flow O2), resolved,    Pulmonary exam normal breath sounds clear to auscultation       Cardiovascular Exercise Tolerance: Good (-) hypertensionNormal cardiovascular exam+ dysrhythmias (h/o palpitations and skipped beats) + Valvular Problems/Murmurs AS  Rhythm:Regular Rate:Normal     Neuro/Psych  Headaches, Seizures - (childhood seizures, not on meds), Well Controlled,  PSYCHIATRIC DISORDERS Anxiety Depression  Neuromuscular disease    GI/Hepatic Neg liver ROS, GERD  Medicated and Controlled,  Endo/Other  negative endocrine ROS  Renal/GU negative Renal ROS     Musculoskeletal  (+) Arthritis , Fibromyalgia -  Abdominal   Peds  Hematology   Anesthesia Other Findings   Reproductive/Obstetrics                            Anesthesia Physical  Anesthesia Plan  ASA: II  Anesthesia Plan: General   Post-op Pain Management:    Induction: Intravenous  PONV Risk Score and Plan: 2 and TIVA  Airway Management Planned: Nasal Cannula, Natural Airway and Simple Face Mask  Additional Equipment:   Intra-op Plan:   Post-operative Plan:   Informed Consent: I have reviewed the patients History and Physical, chart, labs and discussed the procedure including the risks, benefits and alternatives for the proposed anesthesia with the patient or authorized representative who has indicated his/her understanding and acceptance.     Dental advisory given  Plan  Discussed with: CRNA and Surgeon  Anesthesia Plan Comments: (Possible GA with airway was explained.)       Anesthesia Quick Evaluation

## 2020-03-12 NOTE — Anesthesia Postprocedure Evaluation (Signed)
Anesthesia Post Note  Patient: Theresa [name]  Procedure(s) Performed: CHILECTOMY HALLUX (Left )  Anesthesia Type: General Level of consciousness: awake and alert and oriented Pain management: satisfactory to patient Vital Signs Assessment: post-procedure vital signs reviewed and stable Respiratory status: spontaneous breathing Cardiovascular status: blood pressure returned to baseline and stable Postop Assessment: no apparent nausea or vomiting Anesthetic complications: no   No complications documented.   Last Vitals:  Vitals:   03/12/20 0915 03/12/20 0934  BP: 134/85 (!) 148/80  Pulse: 63 62  Resp: 17 16  Temp:  36.5 C  SpO2: 100% 100%    Last Pain:  Vitals:   03/12/20 0934  TempSrc: Oral  PainSc: 6                  Antionio Negron

## 2020-03-12 NOTE — H&P (Signed)
.   HISTORY AND PHYSICAL INTERVAL NOTE:  03/12/2020  7:14 AM  Theresa [name]  has presented today for surgery, with the diagnosis of HALLUX LIMITUS LEFT FOOT AND HALLUX PAIN LEFT FOOT.  The various methods of treatment have been discussed with the patient.  No guarantees were given.  After consideration of risks, benefits and other options for treatment, the patient has consented to surgery.  I have reviewed the patients' chart and labs.    Patient Vitals for the past 24 hrs:  Height Weight  03/12/20 0648 5\' 5"  (1.651 m) 95.7 kg    A history and physical examination was performed in my office.  The patient was reexamined.  There have been no changes to this history and physical examination.  Theresa [name], DPM

## 2020-03-12 NOTE — Brief Op Note (Signed)
03/12/2020  8:48 AM  PATIENT:  Theresa [name]  49 y.o. female  PRE-OPERATIVE DIAGNOSIS:  HALLUX LIMITUS LEFT FOOT AND HALLUX PAIN LEFT FOOT  POST-OPERATIVE DIAGNOSIS:  HALLUX LIMITUS LEFT FOOT AND HALLUX PAIN LEFT FOOT  PROCEDURE:  Cheilectomy of the left hallux.   SURGEON:  Surgeon(s) and Role:    * Waqas Bruhl, Dance movement psychotherapist, DPM - Primary  PHYSICIAN ASSISTANT: None  ANESTHESIA:   local and MAC  EBL:  0 mL   BLOOD ADMINISTERED:none  DRAINS: none   LOCAL MEDICATIONS USED:  MARCAINE   , LIDOCAINE  and Amount: 17 ml  SPECIMEN:  No Specimen  DISPOSITION OF SPECIMEN:  N/A  COUNTS:  YES  TOURNIQUET:   Total Tourniquet Time Documented: Calf (Left) - 43 minutes Total: Calf (Left) - 43 minutes   DICTATION: .Viviann Spare Dictation  PLAN OF CARE: Discharge to home after PACU  PATIENT DISPOSITION:  PACU - hemodynamically stable.   Delay start of Pharmacological VTE agent (>24hrs) due to surgical blood loss or risk of bleeding: not applicable

## 2020-03-12 NOTE — Op Note (Signed)
8:48 AM  PATIENT:  Theresa [name]  49 y.o. female  PRE-OPERATIVE DIAGNOSIS:  HALLUX LIMITUS LEFT FOOT AND HALLUX PAIN LEFT FOOT  POST-OPERATIVE DIAGNOSIS:  HALLUX LIMITUS LEFT FOOT AND HALLUX PAIN LEFT FOOT  PROCEDURE:  Cheilectomy of the left hallux.   SURGEON:  Surgeon(s) and Role:    * Jessica Seidman, Dance movement psychotherapist, DPM - Primary  PHYSICIAN ASSISTANT: None  ANESTHESIA:   local and MAC  EBL:  0 mL   LOCAL MEDICATIONS USED:  MARCAINE   , LIDOCAINE  and Amount: 17 ml  SPECIMEN:  No Specimen  DISPOSITION OF SPECIMEN:  N/A  COUNTS:  YES  TOURNIQUET:   Total Tourniquet Time Documented: Calf (Left) - 43 minutes Total: Calf (Left) - 43 minutes  Patient was brought into the operating room laid supine on the operating table. Ankle tourniquet was applied to the surgical extremity. Following IV sedation, a local block was achieved using 17 cc of mixture of 1% plain lidocaine with 0.5% marcaine. The foot was the prepped, scrubbed and draped in aseptic manner. Using an esmarch band the tourniquet on the surgical site was inflatted at 254mHG.   Attention was directed towards the first MPJ left foot. There is prominent dorsal medial bump with limited range of motion of the first MPJ. Attention was directed to the dorsomedial aspect of the first metatarsophalangeal joint where a 6-cm linear incision was made medial and parallel to the course of the extensor hallucis longus tendon.  The incision was deepened through subcutaneous tissues.  Vital neurovascular structures were identified and retracted.  All bleeders were identified and cauterized.  The incision was deepened to the level of the first metatarsophalangeal joint capsule. There was dorsal osteophyte encountered at the head of the first metatarsal and base of the proximal phalanx, right above the joint line.   The capsular and periosteal structures were dissected free of their osseous attachments and reflected medially and laterally thus  exposing the head of the first metatarsal at the operative site.  Utilizing a sagittal saw, the medial and dorsal prominence was resected and passed from the operative field.  All rough edges were smoothed using power rasp.The surgical site was then irrigated with normal saline. The first MP joint noted to be 35% eroded on the first metatarsal head. The head of the first metatarsal was fenestrated using 4'5 inch K wire to help fibrocartilage. Range of motion of the first MPJ was tested and was significantly better and acceptable than pre-op.    The capsule was closed using 2-0 Vicryl. The subcutaneous tissue was closed using 3-0 Vicryl. The skin was reapproximated using 4-0 Nylon. Dry sterile dressing was applied. Tourniquet was deflated.Capillary refill time was brisk to all lesser toes. Patient will be in surgical shoe with limited weightbearing to the left heel.

## 2020-03-12 NOTE — Discharge Instructions (Signed)

## 2020-03-13 ENCOUNTER — Encounter: Payer: Self-pay | Admitting: Family Medicine

## 2020-03-13 ENCOUNTER — Encounter (HOSPITAL_COMMUNITY): Payer: Self-pay | Admitting: Podiatry

## 2020-03-13 NOTE — Progress Notes (Signed)
Satisfied with care

## 2020-03-20 ENCOUNTER — Ambulatory Visit: Payer: BC Managed Care – PPO | Admitting: Nurse Practitioner

## 2020-03-31 DIAGNOSIS — D225 Melanocytic nevi of trunk: Secondary | ICD-10-CM | POA: Diagnosis not present

## 2020-03-31 DIAGNOSIS — L600 Ingrowing nail: Secondary | ICD-10-CM | POA: Diagnosis not present

## 2020-03-31 DIAGNOSIS — D485 Neoplasm of uncertain behavior of skin: Secondary | ICD-10-CM | POA: Diagnosis not present

## 2020-03-31 DIAGNOSIS — M79675 Pain in left toe(s): Secondary | ICD-10-CM | POA: Diagnosis not present

## 2020-03-31 DIAGNOSIS — D2239 Melanocytic nevi of other parts of face: Secondary | ICD-10-CM | POA: Diagnosis not present

## 2020-03-31 DIAGNOSIS — L650 Telogen effluvium: Secondary | ICD-10-CM | POA: Diagnosis not present

## 2020-04-04 ENCOUNTER — Other Ambulatory Visit: Payer: Self-pay

## 2020-04-04 ENCOUNTER — Telehealth: Payer: Self-pay

## 2020-04-04 ENCOUNTER — Encounter: Payer: Self-pay | Admitting: Nurse Practitioner

## 2020-04-04 ENCOUNTER — Ambulatory Visit (INDEPENDENT_AMBULATORY_CARE_PROVIDER_SITE_OTHER): Payer: BC Managed Care – PPO | Admitting: Nurse Practitioner

## 2020-04-04 VITALS — BP 130/84 | Temp 97.5°F | Ht 65.0 in | Wt 222.0 lb

## 2020-04-04 DIAGNOSIS — K581 Irritable bowel syndrome with constipation: Secondary | ICD-10-CM

## 2020-04-04 DIAGNOSIS — F419 Anxiety disorder, unspecified: Secondary | ICD-10-CM | POA: Diagnosis not present

## 2020-04-04 DIAGNOSIS — R140 Abdominal distension (gaseous): Secondary | ICD-10-CM | POA: Diagnosis not present

## 2020-04-04 DIAGNOSIS — G479 Sleep disorder, unspecified: Secondary | ICD-10-CM | POA: Diagnosis not present

## 2020-04-04 DIAGNOSIS — K219 Gastro-esophageal reflux disease without esophagitis: Secondary | ICD-10-CM

## 2020-04-04 DIAGNOSIS — R52 Pain, unspecified: Secondary | ICD-10-CM

## 2020-04-04 DIAGNOSIS — F32A Depression, unspecified: Secondary | ICD-10-CM

## 2020-04-04 DIAGNOSIS — L659 Nonscarring hair loss, unspecified: Secondary | ICD-10-CM | POA: Diagnosis not present

## 2020-04-04 MED ORDER — CLONAZEPAM 0.5 MG PO TABS: 0.5000 mg | ORAL_TABLET | Freq: Two times a day (BID) | ORAL | 2 refills | Status: DC | PRN

## 2020-04-04 MED ORDER — DULOXETINE HCL 60 MG PO CPEP: 60.0000 mg | ORAL_CAPSULE | Freq: Every day | ORAL | 2 refills | Status: DC

## 2020-04-04 NOTE — Telephone Encounter (Signed)
Please advise. Thank you

## 2020-04-04 NOTE — Telephone Encounter (Signed)
Just sent in. Thanks.

## 2020-04-04 NOTE — Progress Notes (Signed)
Subjective:    Patient ID: Theresa [name], female    DOB: [birth year], 49 y.o.   MRN: [id number]  HPI  Patient arrives to discuss anxiety and nerves. Patient also needs a new script for Adderall-it was stopped for her foot surgery.  Has noticed extreme stress over the past 2 months. Sold her house and moved which she describes as a horrible experience. Had major surgery on her left foot which caused her to miss work. Just found out they did not do the proper paperwork so she will not get paid. Major family issues involving her son which is the greatest source of her mental stress. Has noticed severe hair loss enough that she will be wearing a wig. Has seen dermatology. They have talked about Rogaine. Got much worse about 2 months ago. GERD and IBS-C have been flaring up for the past 2 weeks. No fever or bloody stools. Some epigastric and  LLQ discomfort. History of diverticulosis. Has to manually remove the stool at times. Significant sleep disturbance. Trouble going to sleep and staying asleep. Gets up at least 2-3 times per night, sometimes unable to go back to sleep. Drinking large amounts of caffeine. No NSAIDs, spicy foods. Limited citrus. No tobacco use. Has been "stress eating" which has caused weight gain. Denies suicidal or homicidal thoughts or ideation. Has not been taking Valium, states it does not help.  GAD 7 : Generalized Anxiety Score 04/04/2020  Nervous, Anxious, on Edge 2  Control/stop worrying 3  Worry too much - different things 3  Trouble relaxing 3  Restless 1  Easily annoyed or irritable 2  Afraid - awful might happen 2  Total GAD 7 Score 16  Anxiety Difficulty Very difficult   Depression screen [facility] 2/9 04/04/2020 07/11/2019 11/24/2017  Decreased Interest 3 0 0  Down, Depressed, Hopeless 2 0 0  PHQ - 2 Score 5 0 0  Altered sleeping 3 - -  Tired, decreased energy 3 - -  Change in appetite 3 - -  Feeling bad or failure about yourself  2 - -  Trouble concentrating 3 - -    Moving slowly or fidgety/restless 0 - -  Suicidal thoughts 0 - -  PHQ-9 Score 19 - -  Difficult doing work/chores Very difficult - -  Some recent data might be hidden     Review of Systems     Objective:   Physical Exam NAD. Alert, oriented. Lungs clear. Heart RRR. Abdomen soft, mildly obese, with active BS x 4. Moderate epigastric tenderness. Mild LLQ abd tenderness. No rebound or guarding. No obvious masses.  Alert, oriented.  Mildly anxious affect.  Fatigued in appearance.  Speech clear.  Making good eye contact.  Tearful at times.  Thoughts logical coherent and relevant.       Assessment & Plan:   Problem List Items Addressed This Visit      Digestive   Gastroesophageal reflux disease without esophagitis   Irritable bowel syndrome with constipation     Other   Anxiety - Primary   Relevant Medications   DULoxetine (CYMBALTA) 60 MG capsule   Bloating   Body aches   Hair loss   Sleep disturbance     Meds ordered this encounter  Medications  . DULoxetine (CYMBALTA) 60 MG capsule    Sig: Take 1 capsule (60 mg total) by mouth daily.    Dispense:  30 capsule    Refill:  2    Order Specific Question:   Supervising  Provider    Answer:   Sallee Lange A [[id number]]  . clonazePAM (KLONOPIN) 0.5 MG tablet    Sig: Take 1 tablet (0.5 mg total) by mouth 2 (two) times daily as needed for anxiety.    Dispense:  30 tablet    Refill:  2    Order Specific Question:   Supervising Provider    Answer:   Sallee Lange A [[id number]]   Hold on further Valium. Has taken Cymbalta in the past without side effects.  Restart Cymbalta as directed. Patient requests starting back on 60 mg dose. DC med and contact office if any problems.  Klonopin as needed for anxiety. Drowsiness precautions.  Recommend counseling. She will look into programs online. Will contact office if we can be of assistance.  Discussed how her physical symptoms are associated mostly with her level of stress/anxiety.   Discussed importance of stress reduction. Return in about 1 month (around 05/05/2020) for work excuse for today. Call back sooner if any problems. 30 minutes was spent with the patient including previsit chart review, time spent with patient, discussion of health issues, review of data including medical record, and documentation of the visit.

## 2020-04-04 NOTE — Patient Instructions (Signed)
Probiotic for bowels

## 2020-04-04 NOTE — Telephone Encounter (Signed)
Patient was seen by Hoyle Sauer this morning and she is calling back to see why medication had been called in. I ask which medication was it. She states cant disclosure that.Please advise

## 2020-04-15 ENCOUNTER — Other Ambulatory Visit: Payer: Self-pay | Admitting: Nurse Practitioner

## 2020-04-15 ENCOUNTER — Other Ambulatory Visit: Payer: Self-pay | Admitting: Adult Health

## 2020-04-15 ENCOUNTER — Other Ambulatory Visit: Payer: Self-pay | Admitting: Family Medicine

## 2020-04-21 DIAGNOSIS — H60392 Other infective otitis externa, left ear: Secondary | ICD-10-CM | POA: Diagnosis not present

## 2020-04-21 DIAGNOSIS — E669 Obesity, unspecified: Secondary | ICD-10-CM | POA: Diagnosis not present

## 2020-04-21 DIAGNOSIS — Z20822 Contact with and (suspected) exposure to covid-19: Secondary | ICD-10-CM | POA: Diagnosis not present

## 2020-04-21 DIAGNOSIS — Z6835 Body mass index (BMI) 35.0-35.9, adult: Secondary | ICD-10-CM | POA: Diagnosis not present

## 2020-05-01 ENCOUNTER — Encounter: Payer: Self-pay | Admitting: Nurse Practitioner

## 2020-05-01 ENCOUNTER — Ambulatory Visit (INDEPENDENT_AMBULATORY_CARE_PROVIDER_SITE_OTHER): Payer: BC Managed Care – PPO | Admitting: Nurse Practitioner

## 2020-05-01 ENCOUNTER — Other Ambulatory Visit: Payer: Self-pay

## 2020-05-01 VITALS — BP 148/92 | HR 77 | Temp 97.4°F | Wt 220.4 lb

## 2020-05-01 DIAGNOSIS — H73893 Other specified disorders of tympanic membrane, bilateral: Secondary | ICD-10-CM

## 2020-05-01 DIAGNOSIS — G479 Sleep disorder, unspecified: Secondary | ICD-10-CM

## 2020-05-01 DIAGNOSIS — R7301 Impaired fasting glucose: Secondary | ICD-10-CM

## 2020-05-01 DIAGNOSIS — U099 Post covid-19 condition, unspecified: Secondary | ICD-10-CM

## 2020-05-01 DIAGNOSIS — F32A Depression, unspecified: Secondary | ICD-10-CM

## 2020-05-01 DIAGNOSIS — F419 Anxiety disorder, unspecified: Secondary | ICD-10-CM | POA: Diagnosis not present

## 2020-05-01 DIAGNOSIS — M2550 Pain in unspecified joint: Secondary | ICD-10-CM | POA: Diagnosis not present

## 2020-05-01 DIAGNOSIS — R7401 Elevation of levels of liver transaminase levels: Secondary | ICD-10-CM

## 2020-05-01 DIAGNOSIS — G8929 Other chronic pain: Secondary | ICD-10-CM

## 2020-05-01 NOTE — Progress Notes (Signed)
Subjective:    Patient ID: Theresa [name], female    DOB: [birth year], 49 y.o.   MRN: [id number]  HPI Patient following up on depression and anxiety since starting cymbalta.   Patient also complains of constant pain from head to toe since having covid but seems to be getting worse and keeping her up at night. Pain seems to move locations. Taking ibuprofen and vicodin.  Cymbalta working well for fibromyalgia.  Having migratory joint pain which is been going on for a long time.  Having slight swelling in the fingers with difficulty getting her rings off.  States she has had a work-up with rheumatology in the past and was told she has osteoarthritis.  Pain is become more intense lately.  Recently worse in her low back area.  Had some old Vicodin 5 mg at home, taking this occasionally in the morning for severe pain which helps.  Has taken 2 doses of her Klonopin since getting that filled. Complaints of itching and a creepy crawly feeling in her left ear for about a month.  No fever.  Mild head congestion.  Tested negative for Covid 2 weeks ago. Note that patient had a severe Covid infection which required hospitalization in August. States the joint pain has become worse since then.  Has cut her hair and avoid using chemicals.  Hair loss is slowly improving.  Continues to have sleep disturbance which she relates to her joint pain and back pain. Review of Systems     Objective:   Physical Exam NAD.  Alert, oriented.  Mildly fatigued in appearance.  TMs very retracted, no erythema.  Pharynx nonerythematous.  Neck supple with minimal soft anterior adenopathy.  Lungs clear. Heart regular rate rhythm.  Calm affect.  Some nodularity noted in her fingers.  No erythema or warmth.  Slight hair growth noted on the scalp in areas that are thin.  Abdomen soft nondistended nontender. Note the liver enzymes were normal 05/18/2019 before her illness.      Assessment & Plan:   Problem List Items Addressed This  Visit      Endocrine   IFG (impaired fasting glucose)   Relevant Orders   Hemoglobin A1c (Completed)   ANA (Completed)   Rheumatoid Factor (Completed)   CYCLIC CITRUL PEPTIDE ANTIBODY, IGG/IGA (Completed)     Other   Elevated liver transaminase level   Relevant Orders   Hepatic function panel (Completed)   Post-COVID chronic joint pain   Relevant Medications   HYDROcodone-acetaminophen (NORCO/VICODIN) 5-325 MG tablet   Sleep disturbance   Relevant Orders   ANA (Completed)   Rheumatoid Factor (Completed)   CYCLIC CITRUL PEPTIDE ANTIBODY, IGG/IGA (Completed)    Other Visit Diagnoses    Anxiety and depression    -  Primary   Relevant Orders   ANA (Completed)   Rheumatoid Factor (Completed)   CYCLIC CITRUL PEPTIDE ANTIBODY, IGG/IGA (Completed)   Multiple joint pain       Relevant Orders   ANA (Completed)   Rheumatoid Factor (Completed)   CYCLIC CITRUL PEPTIDE ANTIBODY, IGG/IGA (Completed)   Tympanic membrane retraction, bilateral          Discussed importance of stress reduction and adequate rest.  Advised patient to only take her Vicodin with extreme pain.  Discussed risk of respiratory issues associated with use of Vicodin and benzodiazepines.  Do not recommend any further Vicodin prescriptions at this time.  Use anti-inflammatories sparingly due to history of reflux. Labs pending.  Recommend OTC steroid  nasal spray as directed and OTC antihistamine as directed. Call back if your symptoms persist.  Return in about 3 months (around 07/30/2020) for routine follow up. 30 minutes was spent with the patient including previsit chart review, time spent with patient, discussion of health issues, review of data including medical record, and documentation of the visit.

## 2020-05-01 NOTE — Patient Instructions (Signed)
USP Tumeric

## 2020-05-02 ENCOUNTER — Encounter: Payer: Self-pay | Admitting: Nurse Practitioner

## 2020-05-02 DIAGNOSIS — U099 Post covid-19 condition, unspecified: Secondary | ICD-10-CM | POA: Insufficient documentation

## 2020-05-02 DIAGNOSIS — G8929 Other chronic pain: Secondary | ICD-10-CM | POA: Insufficient documentation

## 2020-05-05 LAB — HEPATIC FUNCTION PANEL
ALT: 32 IU/L (ref 0–32)
AST: 27 IU/L (ref 0–40)
Albumin: 4.6 g/dL (ref 3.8–4.8)
Alkaline Phosphatase: 103 IU/L (ref 44–121)
Bilirubin Total: 1 mg/dL (ref 0.0–1.2)
Bilirubin, Direct: 0.23 mg/dL (ref 0.00–0.40)
Total Protein: 6.7 g/dL (ref 6.0–8.5)

## 2020-05-05 LAB — CYCLIC CITRUL PEPTIDE ANTIBODY, IGG/IGA: Cyclic Citrullin Peptide Ab: 2 units (ref 0–19)

## 2020-05-05 LAB — HEMOGLOBIN A1C
Est. average glucose Bld gHb Est-mCnc: 120 mg/dL
Hgb A1c MFr Bld: 5.8 % — ABNORMAL HIGH (ref 4.8–5.6)

## 2020-05-05 LAB — ANA: ANA Titer 1: NEGATIVE

## 2020-05-05 LAB — RHEUMATOID FACTOR: Rheumatoid fact SerPl-aCnc: <10 IU/mL (ref <14.0)

## 2020-05-20 ENCOUNTER — Other Ambulatory Visit: Payer: Self-pay | Admitting: Adult Health

## 2020-05-20 ENCOUNTER — Other Ambulatory Visit: Payer: Self-pay | Admitting: Family Medicine

## 2020-06-03 ENCOUNTER — Encounter: Payer: Self-pay | Admitting: Nurse Practitioner

## 2020-06-05 ENCOUNTER — Encounter: Payer: Self-pay | Admitting: Nurse Practitioner

## 2020-06-06 ENCOUNTER — Encounter: Payer: Self-pay | Admitting: Nurse Practitioner

## 2020-06-06 ENCOUNTER — Other Ambulatory Visit: Payer: Self-pay | Admitting: Nurse Practitioner

## 2020-06-06 DIAGNOSIS — G8929 Other chronic pain: Secondary | ICD-10-CM

## 2020-06-06 DIAGNOSIS — M2550 Pain in unspecified joint: Secondary | ICD-10-CM

## 2020-06-06 DIAGNOSIS — U099 Post covid-19 condition, unspecified: Secondary | ICD-10-CM

## 2020-06-10 ENCOUNTER — Encounter: Payer: Self-pay | Admitting: *Deleted

## 2020-06-13 ENCOUNTER — Telehealth: Payer: Self-pay | Admitting: Nurse Practitioner

## 2020-06-13 ENCOUNTER — Encounter: Payer: Self-pay | Admitting: Nurse Practitioner

## 2020-06-13 ENCOUNTER — Other Ambulatory Visit: Payer: Self-pay | Admitting: Nurse Practitioner

## 2020-06-13 MED ORDER — AMOXICILLIN-POT CLAVULANATE 875-125 MG PO TABS: 1.0000 | ORAL_TABLET | Freq: Two times a day (BID) | ORAL | 0 refills | Status: DC

## 2020-06-13 NOTE — Telephone Encounter (Signed)
Pt is wanting to know if she can have an antibiotic for her ear. Pt is having earache for about one month. Pt states that she has been to Urgent Care and Hoyle Sauer looked at ear at most recent visit. States she felt some mucus come out last night in sleep but today is having a constant pain. States it has also went into her neck below her ear. Please advise. Thank you  Pt is fine to have MyChart sent to her if antibiotic can be sent in.  [employer] [address]

## 2020-06-13 NOTE — Telephone Encounter (Signed)
Done. See mychart message.

## 2020-06-16 DIAGNOSIS — H5203 Hypermetropia, bilateral: Secondary | ICD-10-CM | POA: Diagnosis not present

## 2020-06-16 DIAGNOSIS — H524 Presbyopia: Secondary | ICD-10-CM | POA: Diagnosis not present

## 2020-06-17 ENCOUNTER — Other Ambulatory Visit: Payer: Self-pay | Admitting: Nurse Practitioner

## 2020-07-18 ENCOUNTER — Other Ambulatory Visit: Payer: Self-pay | Admitting: Nurse Practitioner

## 2020-07-22 NOTE — Progress Notes (Signed)
Office Visit Note  Patient: Theresa [name]             Date of Birth: 12/03/1970           MRN: [id number]             PCP: Kathyrn Drown, MD Referring: Nilda Simmer, NP Visit Date: 08/05/2020 Occupation: _0 @  Subjective:  No chief complaint on file.   History of Present Illness: Theresa [name] is a 50 y.o. female seen in consultation per request of Ms.Hoskins.  According to patient her symptoms started in 2009 with increased pain and fatigue.  She came to see me in 2014 when she was referred by her GYN.  She had recent diagnosis at the time of IBS, diverticulitis, colon polyps, hyperglycemia, diabetes, migraines, carpal tunnel syndrome, heel spurs, plantar fasciitis and frequent urinary tract infections.  She was taking different anti-inflammatories, Vicodin, antidepressants, Klonopin and using eyedrops.  She discontinued all those medications as she did not like the side effects of the medications and wanted to find out the cause of the discomfort and fatigue.  She was experiencing generalized pain and stiffness.  She described her pain on the scale of 0-10 about 49 and fatigue on 0-10 about 5-10 at the time.  She gave history of morning stiffness and nocturnal pain.  At the time all the labs were unremarkable.  She was given a diagnosis of fibromyalgia syndrome.  She was also having neck stiffness in the cervical spine x-ray showed only posterior spurring.  She stated that she was given a diagnosis of degenerative disease of lumbar spine based on her prior x-rays.  She states over the years she has tried chiropractic care stretching , cold compresses and hot baths.  She was hospitalized in August 2021 with respiratory failure from COVID-19 per patient.  She states she was on oxygen while in the hospital and was given IV medications and eventually recovered.  Although since her COVID-19 infection she has been experiencing increased pain and increased fatigue.  She has been taking  Vicodin on as needed basis to get some relief.  In 2020 she also injured her right shoulder while she was working on the [employer].  She states the pain moved to her left shoulder and she was seen by orthopedic surgeons at the time.  Physical therapy for her shoulders and the shoulder joints improved.  The fall 5 months later in December 2020 which caused some bruising and increased pain.  Activities of Daily Living:  Patient reports morning stiffness for 2-3 hours.   Patient Reports nocturnal pain.  Difficulty dressing/grooming: Reports Difficulty climbing stairs: Reports Difficulty getting out of chair: Reports Difficulty using hands for taps, buttons, cutlery, and/or writing: Reports  Review of Systems  Constitutional: Positive for fatigue. Negative for night sweats, weight gain and weight loss.  HENT: Positive for mouth dryness and nose dryness. Negative for mouth sores, trouble swallowing and trouble swallowing.   Eyes: Positive for dryness. Negative for pain, redness, itching and visual disturbance.  Respiratory: Negative for cough, shortness of breath and difficulty breathing.   Cardiovascular: Positive for palpitations. Negative for chest pain, hypertension, irregular heartbeat and swelling in legs/feet.  Gastrointestinal: Positive for constipation. Negative for blood in stool and diarrhea.  Endocrine: Negative for increased urination.  Genitourinary: Negative for difficulty urinating and vaginal dryness.  Musculoskeletal: Positive for arthralgias, joint pain, myalgias, muscle weakness, morning stiffness, muscle tenderness and myalgias. Negative for joint swelling.  Skin: Positive  for color change. Negative for rash, hair loss, redness, skin tightness, ulcers and sensitivity to sunlight.  Allergic/Immunologic: Negative for susceptible to infections.  Neurological: Positive for dizziness, numbness, headaches and weakness. Negative for memory loss and night sweats.  Hematological:  Negative for bruising/bleeding tendency and swollen glands.  Psychiatric/Behavioral: Negative for depressed mood, confusion and sleep disturbance. The patient is not nervous/anxious.     PMFS History:  Patient Active Problem List   Diagnosis Date Noted  . 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/02/2020  . Acute respiratory failure with hypoxia ([place]) 12/22/2019  . COVID-19 12/21/2019  . Pneumonia due to COVID-19 virus 12/18/2019  . Attention deficit disorder (ADD) without hyperactivity 11/15/2019  . Hyperplastic colon polyp 10/28/2019  . History of adenomatous polyp of colon 08/28/2019  . Dysphagia 08/28/2019  . Acute diverticulitis 08/13/2019  . Hypokalemia 08/13/2019  . Elevated liver transaminase level   . Vaginal discharge 07/11/2019  . Hair loss 07/11/2019  . Body aches 07/11/2019  . Brain fog 07/11/2019  . Rectocele 07/11/2019  . Sleep disturbance 07/11/2019  . Gastroesophageal reflux disease without esophagitis 05/21/2019  . Prediabetes 11/26/2017  . Migraine without aura and without status migrainosus, not intractable 10/04/2017  . Morbid obesity ([place]) 04/10/2016  . Diverticulosis of large intestine 12/05/2015  . BRCA negative 02/10/2015  . IFG (impaired fasting glucose) 07/05/2014  . Essential hypertension, benign 04/27/2014  . Anxiety 04/27/2014  . Abdominal pain, epigastric 02/07/2014  . Nausea without vomiting 02/07/2014  . Bloating 02/07/2014  . Other fatigue 09/18/2012  . Hyperglycemia 09/18/2012  . Depression 07/14/2012  . Insomnia due to mental disorder 07/14/2012  . Lower abdominal pain 04/12/2012  . Headache 04/12/2012  . Irritable bowel syndrome with constipation 04/12/2012    Past Medical History:  Diagnosis Date  . Anxiety   . Depression   . Fibromyalgia   . GERD (gastroesophageal reflux disease)   . Headache(784.0)   . Heart murmur   . Hyperplastic colon polyp 10/28/2019   Dr Sydell Axon recommends repeat colonoscopy in 2028.  Marland Kitchen IFG (impaired fasting  glucose)   . Obsessive-compulsive disorder   . Osteoarthritis   . Seizures ([place])    last seizure at age 47-stressed induced seizure.    Family History  Problem Relation Age of Onset  . Alcohol abuse Mother   . Depression Mother   . Anxiety disorder Mother   . Paranoid behavior Mother   . Hypertension Mother   . Diabetes Mother   . Breast cancer Mother   . Alcohol abuse Father   . Hypertension Father   . Diabetes Father   . Physical abuse Brother   . Healthy Daughter   . GER disease Son   . Healthy Son   . Colon cancer Neg Hx   . ADD / ADHD Neg Hx   . Bipolar disorder Neg Hx   . Dementia Neg Hx   . OCD Neg Hx   . Drug abuse Neg Hx   . Schizophrenia Neg Hx   . Seizures Neg Hx   . Sexual abuse Neg Hx    Past Surgical History:  Procedure Laterality Date  . ABDOMINAL HYSTERECTOMY     still has right ovary  . BIOPSY N/A 03/05/2014   Procedure: DUODENAL AND GASTRIC BIOPSY;  Surgeon: Danie Binder, MD;  Location: [facility];  Service: Endoscopy;  Laterality: N/A;  . bladder sling X 3    . CHILECTOMY Left 03/12/2020   Procedure: CHILECTOMY HALLUX;  Surgeon: Tyson Babinski, DPM;  Location:  [facility];  Service: Podiatry;  Laterality: Left;  . CHOLECYSTECTOMY    . COLONOSCOPY  04/24/2012   NUU:VOZDGUY polyp measuring 5 mm in size was found in the proximal transverse colon; polypectomy was performed with cold forceps Pedunculated polyp measuring 1.1 cm in size was found in the sigmoid colon; polypectomy was performed using snare cautery Moderate diverticulosis was noted in the descending colon and sigmoid colon small internal hemorrhoids. next tcs 04/2017  . COLONOSCOPY WITH PROPOFOL N/A 10/23/2019   Procedure: COLONOSCOPY WITH PROPOFOL;  Surgeon: Daneil Dolin, MD;  Location: [facility];  Service: Endoscopy;  Laterality: N/A;  12:30pm  . ESOPHAGOGASTRODUODENOSCOPY N/A 02/18/2014   Procedure: ESOPHAGOGASTRODUODENOSCOPY (EGD);  Surgeon: Danie Binder, MD;  Location: [facility];   Service: Endoscopy;  Laterality: N/A;  115  . ESOPHAGOGASTRODUODENOSCOPY (EGD) WITH PROPOFOL N/A 03/05/2014   SLF: 1. dyspepsia due to constipation, gastritis, and GERD 2. Multiple Gastric polyps 3. Moderate non-erosive gastritis 4. Diverticulum in the 2nd part of the duodenum  . ESOPHAGOGASTRODUODENOSCOPY (EGD) WITH PROPOFOL N/A 10/23/2019   Procedure: ESOPHAGOGASTRODUODENOSCOPY (EGD) WITH PROPOFOL;  Surgeon: Daneil Dolin, MD;  Location: [facility];  Service: Endoscopy;  Laterality: N/A;  Venia Minks DILATION N/A 10/23/2019   Procedure: Venia Minks DILATION;  Surgeon: Daneil Dolin, MD;  Location: [facility];  Service: Endoscopy;  Laterality: N/A;  . POLYPECTOMY  10/23/2019   Procedure: POLYPECTOMY;  Surgeon: Daneil Dolin, MD;  Location: [facility];  Service: Endoscopy;;  . TUBAL LIGATION     Social History   Social History Narrative  . Not on file   Immunization History  Administered Date(s) Administered  . Hepatitis B 01/02/1999  . Influenza-Unspecified 03/03/2006  . PPD Test 04/06/2016  . Td 02/06/2007     Objective: Vital Signs: BP (!) 132/91 (BP Location: Right Arm, Patient Position: Sitting, Cuff Size: Normal)   Pulse 69   Resp 15   Ht _0  (1.651 m)   Wt 228 lb 12.8 oz (103.8 kg)   BMI 38.07 kg/m    Physical Exam Vitals and nursing note reviewed.  Constitutional:      Appearance: She is well-developed.  HENT:     Head: Normocephalic and atraumatic.  Eyes:     Conjunctiva/sclera: Conjunctivae normal.  Cardiovascular:     Rate and Rhythm: Normal rate and regular rhythm.     Heart sounds: Normal heart sounds.  Pulmonary:     Effort: Pulmonary effort is normal.     Breath sounds: Normal breath sounds.  Abdominal:     General: Bowel sounds are normal.     Palpations: Abdomen is soft.  Musculoskeletal:     Cervical back: Normal range of motion.  Lymphadenopathy:     Cervical: No cervical adenopathy.  Skin:    General: Skin is warm and dry.      Capillary Refill: Capillary refill takes less than 2 seconds.  Neurological:     Mental Status: She is alert and oriented to person, place, and time.  Psychiatric:        Behavior: Behavior normal.      Musculoskeletal Exam: C-spine was in good range of motion.  She discomfort range of motion for lumbar spine.  Shoulder joints, elbow joints, wrist joints with good range of motion.  She had no synovitis over MCP joints.  PIP and DIP prominence was noted.  Hip joints with good range of motion.  She had tenderness over bilateral trochanteric bursa.  Knee joints  in good range of motion without any warmth swelling or effusion.  She has postsurgical change in her left great toe with thickening.  PIP and DIP prominence was noted.  No synovitis was noted.  She has generalized hyperalgesia and positive tender points.  CDAI Exam: CDAI Score: -- Patient Global: --; Provider Global: -- Swollen: --; Tender: -- Joint Exam 08/05/2020   No joint exam has been documented for this visit   There is currently no information documented on the homunculus. Go to the Rheumatology activity and complete the homunculus joint exam.  Investigation: No additional findings.  Imaging: No results found.  Recent Labs: Lab Results  Component Value Date   WBC 4.9 01/14/2020   HGB 11.6 01/14/2020   PLT 210 01/14/2020   NA 142 01/14/2020   K 4.4 01/14/2020   CL 105 01/14/2020   CO2 23 01/14/2020   GLUCOSE 106 (H) 01/14/2020   BUN 10 01/14/2020   CREATININE 0.69 01/14/2020   BILITOT 1.0 05/01/2020   ALKPHOS 103 05/01/2020   AST 27 05/01/2020   ALT 32 05/01/2020   PROT 6.7 05/01/2020   ALBUMIN 4.6 05/01/2020   CALCIUM 9.2 01/14/2020   GFRAA 119 01/14/2020    March 01, 2013 CBC normal, CMP normal, sed rate 4, hepatitis panel negative, CK 52, TSH normal, immunoglobulins normal, SPEP normal, RF negative, anti-CCP negative, vitamin D 37, ANA negative   Speciality Comments: No specialty comments  available.  Procedures:  No procedures performed Allergies: Fentanyl and Prednisone   Assessment / Plan:     Visit Diagnoses: Polyarthralgia -patient complains of increased pain and discomfort in her joints and she had COVID-19 infection.  Although she has had generalized pain for many years.  All autoimmune work-up was negative in 2014.  Her recent autoimmune work-up was negative as well.  I do not see any synovitis on examination.  05/01/20: ANA negative, RF<10, anti-CCP 2  Fibromyalgia-she has longstanding history of fibromyalgia.  She has generalized pain and positive tender points.  She has been also under a lot of stress.  I believe her fibromyalgia most likely flared after COVID-19 infection and deconditioning.  She would benefit from restarting Cymbalta.  Have advised her to discuss that further with her PCP.  I have also referred her to integrative therapies.  I encouraged her to gradually increase exercise.  I advised water aerobics and swimming which will be helpful.  Stretching exercises will be helpful.  Other insomnia-she has chronic insomnia.  Good sleep hygiene was discussed.  Other fatigue-most likely related to fatigue and fibromyalgia.  Primary osteoarthritis of both hands-clinical findings are consistent with osteoarthritis.  She has bilateral DIP and PIP thickening.  No synovitis was noted.  All autoimmune work-up is negative.  Joint protection muscle strengthening was discussed.  A handout on hand exercises was given.  Trochanteric bursitis of both hips-she had tenderness over bilateral trochanteric bursa.  IT band stretches handout was given.  Primary osteoarthritis of both feet-DIP and PIP prominence was noted in her feet consistent with osteoarthritis.  She continues to have discomfort in her left great to.  She had surgery.  Post-COVID chronic joint pain-she has been experiencing increased pain and fatigue since she had COVID-19.  All autoimmune work-up was  negative.  Anxiety and depression-she believes her depression is better but she still have significant anxiety.  She may benefit from counseling.  Essential hypertension, benign-blood pressure is mildly elevated today.  Other medical problems are listed as follows:  Migraine without  aura and without status migrainosus, not intractable  Irritable bowel syndrome with constipation  Gastroesophageal reflux disease without esophagitis  Rectocele  History of diverticulitis  Hx of colonic polyp  Attention deficit disorder (ADD) without hyperactivity  Pneumonia due to COVID-19 virus  Orders: No orders of the defined types were placed in this encounter.  No orders of the defined types were placed in this encounter.    Follow-Up Instructions: Return if symptoms worsen or fail to improve, for Osteoarthritis.   Bo Merino, MD  Note - This record has been created using Editor, commissioning.  Chart creation errors have been sought, but may not always  have been located. Such creation errors do not reflect on  the standard of medical care.

## 2020-07-24 ENCOUNTER — Encounter: Payer: Self-pay | Admitting: Nurse Practitioner

## 2020-07-25 ENCOUNTER — Other Ambulatory Visit: Payer: Self-pay | Admitting: Nurse Practitioner

## 2020-07-25 MED ORDER — LISINOPRIL-HYDROCHLOROTHIAZIDE 20-25 MG PO TABS: 1.0000 | ORAL_TABLET | Freq: Every day | ORAL | 0 refills | Status: DC

## 2020-08-05 ENCOUNTER — Other Ambulatory Visit: Payer: Self-pay

## 2020-08-05 ENCOUNTER — Ambulatory Visit: Payer: BC Managed Care – PPO | Admitting: Rheumatology

## 2020-08-05 ENCOUNTER — Encounter: Payer: Self-pay | Admitting: Rheumatology

## 2020-08-05 VITALS — BP 132/91 | HR 69 | Resp 15 | Ht 65.0 in | Wt 228.8 lb

## 2020-08-05 DIAGNOSIS — N816 Rectocele: Secondary | ICD-10-CM

## 2020-08-05 DIAGNOSIS — U071 COVID-19: Secondary | ICD-10-CM

## 2020-08-05 DIAGNOSIS — M2550 Pain in unspecified joint: Secondary | ICD-10-CM

## 2020-08-05 DIAGNOSIS — G4709 Other insomnia: Secondary | ICD-10-CM | POA: Diagnosis not present

## 2020-08-05 DIAGNOSIS — M19071 Primary osteoarthritis, right ankle and foot: Secondary | ICD-10-CM

## 2020-08-05 DIAGNOSIS — M7062 Trochanteric bursitis, left hip: Secondary | ICD-10-CM

## 2020-08-05 DIAGNOSIS — F5105 Insomnia due to other mental disorder: Secondary | ICD-10-CM

## 2020-08-05 DIAGNOSIS — G43009 Migraine without aura, not intractable, without status migrainosus: Secondary | ICD-10-CM

## 2020-08-05 DIAGNOSIS — M797 Fibromyalgia: Secondary | ICD-10-CM

## 2020-08-05 DIAGNOSIS — M19072 Primary osteoarthritis, left ankle and foot: Secondary | ICD-10-CM

## 2020-08-05 DIAGNOSIS — Z86010 Personal history of colonic polyps: Secondary | ICD-10-CM

## 2020-08-05 DIAGNOSIS — Z8719 Personal history of other diseases of the digestive system: Secondary | ICD-10-CM

## 2020-08-05 DIAGNOSIS — F419 Anxiety disorder, unspecified: Secondary | ICD-10-CM

## 2020-08-05 DIAGNOSIS — M19041 Primary osteoarthritis, right hand: Secondary | ICD-10-CM

## 2020-08-05 DIAGNOSIS — G8929 Other chronic pain: Secondary | ICD-10-CM

## 2020-08-05 DIAGNOSIS — J1282 Pneumonia due to coronavirus disease 2019: Secondary | ICD-10-CM

## 2020-08-05 DIAGNOSIS — M7061 Trochanteric bursitis, right hip: Secondary | ICD-10-CM

## 2020-08-05 DIAGNOSIS — K219 Gastro-esophageal reflux disease without esophagitis: Secondary | ICD-10-CM

## 2020-08-05 DIAGNOSIS — K581 Irritable bowel syndrome with constipation: Secondary | ICD-10-CM

## 2020-08-05 DIAGNOSIS — R5383 Other fatigue: Secondary | ICD-10-CM | POA: Diagnosis not present

## 2020-08-05 DIAGNOSIS — M19042 Primary osteoarthritis, left hand: Secondary | ICD-10-CM

## 2020-08-05 DIAGNOSIS — K5792 Diverticulitis of intestine, part unspecified, without perforation or abscess without bleeding: Secondary | ICD-10-CM

## 2020-08-05 DIAGNOSIS — F32A Depression, unspecified: Secondary | ICD-10-CM

## 2020-08-05 DIAGNOSIS — F988 Other specified behavioral and emotional disorders with onset usually occurring in childhood and adolescence: Secondary | ICD-10-CM

## 2020-08-05 DIAGNOSIS — U099 Post covid-19 condition, unspecified: Secondary | ICD-10-CM

## 2020-08-05 DIAGNOSIS — I10 Essential (primary) hypertension: Secondary | ICD-10-CM

## 2020-08-05 NOTE — Patient Instructions (Signed)
Iliotibial Band Syndrome Rehab Ask your health care provider which exercises are safe for you. Do exercises exactly as told by your health care provider and adjust them as directed. It is normal to feel mild stretching, pulling, tightness, or discomfort as you do these exercises. Stop right away if you feel sudden pain or your pain gets significantly worse. Do not begin these exercises until told by your health care provider. Stretching and range-of-motion exercises These exercises warm up your muscles and joints and improve the movement and flexibility of your hip and pelvis. Quadriceps stretch, prone 1. Lie on your abdomen (prone position) on a firm surface, such as a bed or padded floor. 2. Bend your left / right knee and reach back to hold your ankle or pant leg. If you cannot reach your ankle or pant leg, loop a belt around your foot and grab the belt instead. 3. Gently pull your heel toward your buttocks. Your knee should not slide out to the side. You should feel a stretch in the front of your thigh and knee (quadriceps). 4. Hold this position for __________ seconds. Repeat __________ times. Complete this exercise __________ times a day.   Iliotibial band stretch An iliotibial band is a strong band of muscle tissue that runs from the outer side of your hip to the outer side of your thigh and knee. 1. Lie on your side with your left / right leg in the top position. 2. Bend both of your knees and grab your left / right ankle. Stretch out your bottom arm to help you balance. 3. Slowly bring your top knee back so your thigh goes behind your trunk. 4. Slowly lower your top leg toward the floor until you feel a gentle stretch on the outside of your left / right hip and thigh. If you do not feel a stretch and your knee will not fall farther, place the heel of your other foot on top of your knee and pull your knee down toward the floor with your foot. 5. Hold this position for __________  seconds. Repeat __________ times. Complete this exercise __________ times a day.   Strengthening exercises These exercises build strength and endurance in your hip and pelvis. Endurance is the ability to use your muscles for a long time, even after they get tired. Straight leg raises, side-lying This exercise strengthens the muscles that rotate the leg at the hip and move it away from your body (hip abductors). 1. Lie on your side with your left / right leg in the top position. Lie so your head, shoulder, hip, and knee line up. You may bend your bottom knee to help you balance. 2. Roll your hips slightly forward so your hips are stacked directly over each other and your left / right knee is facing forward. 3. Tense the muscles in your outer thigh and lift your top leg 4-6 inches (10-15 cm). 4. Hold this position for __________ seconds. 5. Slowly lower your leg to return to the starting position. Let your muscles relax completely before doing another repetition. Repeat __________ times. Complete this exercise __________ times a day.   Leg raises, prone This exercise strengthens the muscles that move the hips backward (hip extensors). 1. Lie on your abdomen (prone position) on your bed or a firm surface. You can put a pillow under your hips if that is more comfortable for your lower back. 2. Bend your left / right knee so your foot is straight up in the air.   3. Squeeze your buttocks muscles and lift your left / right thigh off the bed. Do not let your back arch. 4. Tense your thigh muscle as hard as you can without increasing any knee pain. 5. Hold this position for __________ seconds. 6. Slowly lower your leg to return to the starting position and allow it to relax completely. Repeat __________ times. Complete this exercise __________ times a day. Hip hike 1. Stand sideways on a bottom step. Stand on your left / right leg with your other foot unsupported next to the step. You can hold on to a  railing or wall for balance if needed. 2. Keep your knees straight and your torso square. Then lift your left / right hip up toward the ceiling. 3. Slowly let your left / right hip lower toward the floor, past the starting position. Your foot should get closer to the floor. Do not lean or bend your knees. Repeat __________ times. Complete this exercise __________ times a day. This information is not intended to replace advice given to you by your health care provider. Make sure you discuss any questions you have with your health care provider. Document Revised: 06/27/2019 Document Reviewed: 06/27/2019 Elsevier Patient Education  2021 [place]. Hand Exercises Hand exercises can be helpful for almost anyone. These exercises can strengthen the hands, improve flexibility and movement, and increase blood flow to the hands. These results can make work and daily tasks easier. Hand exercises can be especially helpful for people who have joint pain from arthritis or have nerve damage from overuse (carpal tunnel syndrome). These exercises can also help people who have injured a hand. Exercises Most of these hand exercises are gentle stretching and motion exercises. It is usually safe to do them often throughout the day. Warming up your hands before exercise may help to reduce stiffness. You can do this with gentle massage or by placing your hands in warm water for 10-15 minutes. It is normal to feel some stretching, pulling, tightness, or mild discomfort as you begin new exercises. This will gradually improve. Stop an exercise right away if you feel sudden, severe pain or your pain gets worse. Ask your health care provider which exercises are best for you. Knuckle bend or "claw" fist 1. Stand or sit with your arm, hand, and all five fingers pointed straight up. Make sure to keep your wrist straight during the exercise. 2. Gently bend your fingers down toward your palm until the tips of your fingers are  touching the top of your palm. Keep your big knuckle straight and just bend the small knuckles in your fingers. 3. Hold this position for __________ seconds. 4. Straighten (extend) your fingers back to the starting position. Repeat this exercise 5-10 times with each hand. Full finger fist 1. Stand or sit with your arm, hand, and all five fingers pointed straight up. Make sure to keep your wrist straight during the exercise. 2. Gently bend your fingers into your palm until the tips of your fingers are touching the middle of your palm. 3. Hold this position for __________ seconds. 4. Extend your fingers back to the starting position, stretching every joint fully. Repeat this exercise 5-10 times with each hand. Straight fist 1. Stand or sit with your arm, hand, and all five fingers pointed straight up. Make sure to keep your wrist straight during the exercise. 2. Gently bend your fingers at the big knuckle, where your fingers meet your hand, and the middle knuckle. Keep the knuckle  at the tips of your fingers straight and try to touch the bottom of your palm. 3. Hold this position for __________ seconds. 4. Extend your fingers back to the starting position, stretching every joint fully. Repeat this exercise 5-10 times with each hand. Tabletop 1. Stand or sit with your arm, hand, and all five fingers pointed straight up. Make sure to keep your wrist straight during the exercise. 2. Gently bend your fingers at the big knuckle, where your fingers meet your hand, as far down as you can while keeping the small knuckles in your fingers straight. Think of forming a tabletop with your fingers. 3. Hold this position for __________ seconds. 4. Extend your fingers back to the starting position, stretching every joint fully. Repeat this exercise 5-10 times with each hand. Finger spread 1. Place your hand flat on a table with your palm facing down. Make sure your wrist stays straight as you do this  exercise. 2. Spread your fingers and thumb apart from each other as far as you can until you feel a gentle stretch. Hold this position for __________ seconds. 3. Bring your fingers and thumb tight together again. Hold this position for __________ seconds. Repeat this exercise 5-10 times with each hand. Making circles 1. Stand or sit with your arm, hand, and all five fingers pointed straight up. Make sure to keep your wrist straight during the exercise. 2. Make a circle by touching the tip of your thumb to the tip of your index finger. 3. Hold for __________ seconds. Then open your hand wide. 4. Repeat this motion with your thumb and each finger on your hand. Repeat this exercise 5-10 times with each hand. Thumb motion 1. Sit with your forearm resting on a table and your wrist straight. Your thumb should be facing up toward the ceiling. Keep your fingers relaxed as you move your thumb. 2. Lift your thumb up as high as you can toward the ceiling. Hold for __________ seconds. 3. Bend your thumb across your palm as far as you can, reaching the tip of your thumb for the small finger (pinkie) side of your palm. Hold for __________ seconds. Repeat this exercise 5-10 times with each hand. Grip strengthening 1. Hold a stress ball or other soft ball in the middle of your hand. 2. Slowly increase the pressure, squeezing the ball as much as you can without causing pain. Think of bringing the tips of your fingers into the middle of your palm. All of your finger joints should bend when doing this exercise. 3. Hold your squeeze for __________ seconds, then relax. Repeat this exercise 5-10 times with each hand.   Contact a health care provider if:  Your hand pain or discomfort gets much worse when you do an exercise.  Your hand pain or discomfort does not improve within 2 hours after you exercise. If you have any of these problems, stop doing these exercises right away. Do not do them again unless your  health care provider says that you can. Get help right away if:  You develop sudden, severe hand pain or swelling. If this happens, stop doing these exercises right away. Do not do them again unless your health care provider says that you can. This information is not intended to replace advice given to you by your health care provider. Make sure you discuss any questions you have with your health care provider. Document Revised: 08/10/2018 Document Reviewed: 04/20/2018 Elsevier Patient Education  2021 [employer].

## 2020-08-05 NOTE — Addendum Note (Signed)
Addended by: Earnestine Mealing on: 08/05/2020 09:53 AM   Modules accepted: Orders

## 2020-09-05 ENCOUNTER — Ambulatory Visit: Payer: BC Managed Care – PPO | Admitting: Rheumatology

## 2020-09-19 ENCOUNTER — Other Ambulatory Visit: Payer: Self-pay | Admitting: Nurse Practitioner

## 2020-10-05 ENCOUNTER — Encounter: Payer: Self-pay | Admitting: Nurse Practitioner

## 2020-10-08 ENCOUNTER — Other Ambulatory Visit (HOSPITAL_COMMUNITY): Payer: Self-pay | Admitting: Family Medicine

## 2020-10-08 DIAGNOSIS — R0609 Other forms of dyspnea: Secondary | ICD-10-CM

## 2020-10-08 DIAGNOSIS — M545 Low back pain, unspecified: Secondary | ICD-10-CM | POA: Diagnosis not present

## 2020-10-08 DIAGNOSIS — R0600 Dyspnea, unspecified: Secondary | ICD-10-CM

## 2020-10-08 DIAGNOSIS — M791 Myalgia, unspecified site: Secondary | ICD-10-CM | POA: Diagnosis not present

## 2020-10-09 ENCOUNTER — Other Ambulatory Visit: Payer: Self-pay | Admitting: Family Medicine

## 2020-10-20 ENCOUNTER — Ambulatory Visit (HOSPITAL_COMMUNITY)
Admission: RE | Admit: 2020-10-20 | Discharge: 2020-10-20 | Disposition: A | Discharge status: Home / Self Care | Payer: BC Managed Care – PPO | Source: Ambulatory Visit | Attending: Family Medicine | Admitting: Family Medicine

## 2020-10-20 ENCOUNTER — Other Ambulatory Visit: Payer: Self-pay

## 2020-10-20 ENCOUNTER — Telehealth: Payer: Self-pay | Admitting: Family Medicine

## 2020-10-20 DIAGNOSIS — R0609 Other forms of dyspnea: Secondary | ICD-10-CM

## 2020-10-20 DIAGNOSIS — J189 Pneumonia, unspecified organism: Secondary | ICD-10-CM | POA: Diagnosis not present

## 2020-10-20 DIAGNOSIS — R0600 Dyspnea, unspecified: Secondary | ICD-10-CM | POA: Insufficient documentation

## 2020-10-20 NOTE — Telephone Encounter (Signed)
May have order for diagnostic mammogram and ultrasound due to right breast pain keep follow-up visit with Hoyle Sauer

## 2020-10-20 NOTE — Telephone Encounter (Signed)
Patient states she called back in the beginning of June requesting a diagnostic mammogram due to right breast pain. [facility] And was told that she had to have an order since it will be for a diagnostic mammogram. She did make an appt in July to see Hoyle Sauer.  CB# [phone]

## 2020-10-21 ENCOUNTER — Other Ambulatory Visit: Payer: Self-pay

## 2020-10-21 DIAGNOSIS — N644 Mastodynia: Secondary | ICD-10-CM

## 2020-10-22 ENCOUNTER — Other Ambulatory Visit: Payer: Self-pay | Admitting: Family Medicine

## 2020-10-22 DIAGNOSIS — N644 Mastodynia: Secondary | ICD-10-CM

## 2020-11-11 DIAGNOSIS — J019 Acute sinusitis, unspecified: Secondary | ICD-10-CM | POA: Diagnosis not present

## 2020-11-19 ENCOUNTER — Encounter: Payer: Self-pay | Admitting: Nurse Practitioner

## 2020-11-19 ENCOUNTER — Ambulatory Visit: Payer: BC Managed Care – PPO | Admitting: Nurse Practitioner

## 2020-11-19 ENCOUNTER — Other Ambulatory Visit: Payer: Self-pay

## 2020-11-19 VITALS — BP 105/76 | HR 84 | Temp 97.7°F | Ht 65.0 in | Wt 227.0 lb

## 2020-11-19 DIAGNOSIS — U099 Post covid-19 condition, unspecified: Secondary | ICD-10-CM

## 2020-11-19 DIAGNOSIS — R5383 Other fatigue: Secondary | ICD-10-CM

## 2020-11-19 DIAGNOSIS — M2550 Pain in unspecified joint: Secondary | ICD-10-CM

## 2020-11-19 DIAGNOSIS — Z23 Encounter for immunization: Secondary | ICD-10-CM | POA: Diagnosis not present

## 2020-11-19 DIAGNOSIS — G8929 Other chronic pain: Secondary | ICD-10-CM

## 2020-11-19 DIAGNOSIS — I10 Essential (primary) hypertension: Secondary | ICD-10-CM | POA: Diagnosis not present

## 2020-11-19 DIAGNOSIS — F988 Other specified behavioral and emotional disorders with onset usually occurring in childhood and adolescence: Secondary | ICD-10-CM

## 2020-11-19 DIAGNOSIS — R0789 Other chest pain: Secondary | ICD-10-CM

## 2020-11-19 DIAGNOSIS — R7301 Impaired fasting glucose: Secondary | ICD-10-CM

## 2020-11-19 MED ORDER — AMPHETAMINE-DEXTROAMPHET ER 10 MG PO CP24: 10.0000 mg | ORAL_CAPSULE | Freq: Every day | ORAL | 0 refills | Status: DC

## 2020-11-19 NOTE — Progress Notes (Signed)
Subjective:    Patient ID: Theresa [name], female    DOB: [birth year], 50 y.o.   MRN: [id number]  HPI Presents for routine follow-up on her health conditions.  States she is feeling better emotionally, has stopped her sertraline.  Struggling to focus and concentrate while at work, has been treated for adult ADD in the past, would like to restart her Adderall.  Had a long a short period of time of palpitations after having severe COVID and hospitalization but this has resolved. Is followed by GI for her acid reflux disease. Her main concern is about 2 months ago she pulled a muscle in the right axillary area towards the right breast.  Remembers when this happened.  Continues to have off-and-on pain mainly when she lays on the right side.  Her self breast exams have been normal.  Has an appointment for diagnostic mammogram and ultrasound scheduled.  Has also had a chest x-ray which showed some mild scarring most likely from her severe COVID infection.  Otherwise no chest discomfort or unusual shortness of breath.  Has been working on improving her diet, currently on an Atkins diet.  Limited activity because she "works all the time".  Checks her BP at home which has been running similar to what we are getting today at times.  Denies any excessively high blood pressures.  No edema. Has a problem with chronic joint pain especially in the knees since her COVID infection.  Has been taking naturals including turmeric and dandelion tea which has greatly helped her symptoms.  Defers referral to orthopedics at this time.     Objective:   Physical Exam NAD.  Alert, oriented.  Cheerful calm affect.  Making good eye contact.  Dressed appropriately.  Thoughts logical coherent and relevant.  Lungs clear.  Heart regular rate rhythm.  Several areas of density noted in both breast but no dominant masses.  No edema or skin change.  Tenderness noted with deep palpation on the outer aspect of the right breast near the  border at 8:00.  Axilla no adenopathy.  Abdomen obese soft nondistended nontender.  Lower extremities no edema. Today's Vitals   11/19/20 0902  BP: 105/76  Pulse: 84  Temp: 97.7 F (36.5 C)  SpO2: 96%  Weight: 227 lb (103 kg)  Height: 5\' 5"  (1.651 m)   Body mass index is 37.77 kg/m.        Assessment & Plan:   Problem List Items Addressed This Visit       Cardiovascular and Mediastinum   Essential hypertension, benign - Primary   Relevant Orders   CBC with Differential/Platelet   Lipid panel   Comprehensive metabolic panel     Endocrine   IFG (impaired fasting glucose)   Relevant Orders   CBC with Differential/Platelet   Lipid panel   Comprehensive metabolic panel     Other   Attention deficit disorder (ADD) without hyperactivity   Other fatigue   Relevant Orders   CBC with Differential/Platelet   Comprehensive metabolic panel   Post-COVID chronic joint pain   Other Visit Diagnoses     Encounter for immunization       Relevant Orders   Pneumococcal polysaccharide vaccine 23-valent greater than or equal to 2yo subcutaneous/IM (Completed)   Right-sided chest wall pain          Mammogram and ultrasound pending. Routine labs ordered. Pneumovax 23 today due to high risk status post COVID. Defers COVID-vaccine at this time. Patient to continue  to monitor BP at home, especially with any weight loss or activity.  If blood pressure is dropping well below 120/80 cut her Zestoretic dose in half and continue to monitor BP.  Patient verbalizes understanding and agrees with this plan. Meds ordered this encounter  Medications   amphetamine-dextroamphetamine (ADDERALL XR) 10 MG 24 hr capsule    Sig: Take 1 capsule (10 mg total) by mouth daily.    Dispense:  30 capsule    Refill:  0    Order Specific Question:   Supervising Provider    Answer:   Sallee Lange A [[id number]]   Restart Adderall at previous dose.  Discontinue medication and contact office if any adverse  effects including palpitations.  No further Klonopin or Zoloft prescribed at this time. Return in about 3 months (around 02/19/2021). 30 minutes was spent with the patient including previsit chart review, time spent with patient, discussion of health issues, review of data including medical record, and documentation of the visit.

## 2020-12-03 ENCOUNTER — Ambulatory Visit
Admission: RE | Admit: 2020-12-03 | Discharge: 2020-12-03 | Disposition: A | Discharge status: Home / Self Care | Payer: BC Managed Care – PPO | Source: Ambulatory Visit | Attending: Family Medicine | Admitting: Family Medicine

## 2020-12-03 ENCOUNTER — Other Ambulatory Visit: Payer: Self-pay

## 2020-12-03 ENCOUNTER — Other Ambulatory Visit: Payer: BC Managed Care – PPO

## 2020-12-03 DIAGNOSIS — R922 Inconclusive mammogram: Secondary | ICD-10-CM | POA: Diagnosis not present

## 2020-12-03 DIAGNOSIS — N644 Mastodynia: Secondary | ICD-10-CM | POA: Diagnosis not present

## 2020-12-12 ENCOUNTER — Encounter: Payer: Self-pay | Admitting: Nurse Practitioner

## 2020-12-24 ENCOUNTER — Encounter: Payer: Self-pay | Admitting: Nurse Practitioner

## 2020-12-26 ENCOUNTER — Other Ambulatory Visit: Payer: Self-pay | Admitting: Family Medicine

## 2020-12-26 NOTE — Telephone Encounter (Signed)
This med was discontinued per request of the patient. Does she want a refill? Thanks.

## 2020-12-26 NOTE — Telephone Encounter (Signed)
Left message to return call 

## 2020-12-27 ENCOUNTER — Other Ambulatory Visit: Payer: Self-pay | Admitting: Nurse Practitioner

## 2020-12-27 MED ORDER — AMPHETAMINE-DEXTROAMPHET ER 20 MG PO CP24: 20.0000 mg | ORAL_CAPSULE | ORAL | 0 refills | Status: DC

## 2021-01-20 DIAGNOSIS — J4521 Mild intermittent asthma with (acute) exacerbation: Secondary | ICD-10-CM | POA: Diagnosis not present

## 2021-01-21 DIAGNOSIS — R059 Cough, unspecified: Secondary | ICD-10-CM | POA: Diagnosis not present

## 2021-02-06 ENCOUNTER — Encounter: Payer: Self-pay | Admitting: Nurse Practitioner

## 2021-02-13 ENCOUNTER — Other Ambulatory Visit: Payer: Self-pay | Admitting: Nurse Practitioner

## 2021-02-13 ENCOUNTER — Other Ambulatory Visit: Payer: Self-pay | Admitting: Family Medicine

## 2021-02-18 NOTE — Telephone Encounter (Signed)
Sent my chart message on 02/18/21

## 2021-02-19 DIAGNOSIS — R52 Pain, unspecified: Secondary | ICD-10-CM | POA: Diagnosis not present

## 2021-02-19 DIAGNOSIS — I10 Essential (primary) hypertension: Secondary | ICD-10-CM | POA: Diagnosis not present

## 2021-02-19 DIAGNOSIS — R7301 Impaired fasting glucose: Secondary | ICD-10-CM | POA: Diagnosis not present

## 2021-02-20 LAB — CBC WITH DIFFERENTIAL/PLATELET
Basophils Absolute: 0 10*3/uL (ref 0.0–0.2)
Basos: 1 %
EOS (ABSOLUTE): 0.2 10*3/uL (ref 0.0–0.4)
Eos: 4 %
Hematocrit: 38.2 % (ref 34.0–46.6)
Hemoglobin: 12.7 g/dL (ref 11.1–15.9)
Immature Grans (Abs): 0 10*3/uL (ref 0.0–0.1)
Immature Granulocytes: 0 %
Lymphocytes Absolute: 1.7 10*3/uL (ref 0.7–3.1)
Lymphs: 29 %
MCH: 29.7 pg (ref 26.6–33.0)
MCHC: 33.2 g/dL (ref 31.5–35.7)
MCV: 89 fL (ref 79–97)
Monocytes Absolute: 0.5 10*3/uL (ref 0.1–0.9)
Monocytes: 9 %
Neutrophils Absolute: 3.3 10*3/uL (ref 1.4–7.0)
Neutrophils: 57 %
Platelets: 247 10*3/uL (ref 150–450)
RBC: 4.28 x10E6/uL (ref 3.77–5.28)
RDW: 12.6 % (ref 11.7–15.4)
WBC: 5.8 10*3/uL (ref 3.4–10.8)

## 2021-02-20 LAB — COMPREHENSIVE METABOLIC PANEL
ALT: 22 IU/L (ref 0–32)
AST: 24 IU/L (ref 0–40)
Albumin/Globulin Ratio: 1.6 (ref 1.2–2.2)
Albumin: 4.3 g/dL (ref 3.8–4.8)
Alkaline Phosphatase: 99 IU/L (ref 44–121)
BUN/Creatinine Ratio: 10 (ref 9–23)
BUN: 9 mg/dL (ref 6–24)
Bilirubin Total: 0.8 mg/dL (ref 0.0–1.2)
CO2: 25 mmol/L (ref 20–29)
Calcium: 10 mg/dL (ref 8.7–10.2)
Chloride: 104 mmol/L (ref 96–106)
Creatinine, Ser: 0.86 mg/dL (ref 0.57–1.00)
Globulin, Total: 2.7 g/dL (ref 1.5–4.5)
Glucose: 99 mg/dL (ref 70–99)
Potassium: 4.3 mmol/L (ref 3.5–5.2)
Sodium: 140 mmol/L (ref 134–144)
Total Protein: 7 g/dL (ref 6.0–8.5)
eGFR: 83 mL/min/{1.73_m2} (ref >59)

## 2021-02-20 LAB — LIPID PANEL
Chol/HDL Ratio: 2.8 ratio (ref 0.0–4.4)
Cholesterol, Total: 206 mg/dL — ABNORMAL HIGH (ref 100–199)
HDL: 73 mg/dL (ref >39)
LDL Chol Calc (NIH): 118 mg/dL — ABNORMAL HIGH (ref 0–99)
Triglycerides: 86 mg/dL (ref 0–149)
VLDL Cholesterol Cal: 15 mg/dL (ref 5–40)

## 2021-02-20 MED ORDER — LISINOPRIL-HYDROCHLOROTHIAZIDE 20-25 MG PO TABS: 1.0000 | ORAL_TABLET | Freq: Every day | ORAL | 0 refills | Status: DC

## 2021-02-20 MED ORDER — AMPHETAMINE-DEXTROAMPHET ER 20 MG PO CP24: 20.0000 mg | ORAL_CAPSULE | ORAL | 0 refills | Status: DC

## 2021-02-27 ENCOUNTER — Other Ambulatory Visit: Payer: Self-pay

## 2021-02-27 ENCOUNTER — Ambulatory Visit: Payer: BC Managed Care – PPO | Admitting: Nurse Practitioner

## 2021-02-27 ENCOUNTER — Encounter: Payer: Self-pay | Admitting: Nurse Practitioner

## 2021-02-27 VITALS — Temp 97.3°F | Ht 65.0 in | Wt 234.0 lb

## 2021-02-27 DIAGNOSIS — R5383 Other fatigue: Secondary | ICD-10-CM | POA: Diagnosis not present

## 2021-02-27 DIAGNOSIS — R739 Hyperglycemia, unspecified: Secondary | ICD-10-CM

## 2021-02-27 DIAGNOSIS — F988 Other specified behavioral and emotional disorders with onset usually occurring in childhood and adolescence: Secondary | ICD-10-CM

## 2021-02-27 DIAGNOSIS — K219 Gastro-esophageal reflux disease without esophagitis: Secondary | ICD-10-CM | POA: Diagnosis not present

## 2021-02-27 DIAGNOSIS — I10 Essential (primary) hypertension: Secondary | ICD-10-CM | POA: Diagnosis not present

## 2021-02-27 MED ORDER — AMPHETAMINE-DEXTROAMPHET ER 20 MG PO CP24: 20.0000 mg | ORAL_CAPSULE | ORAL | 0 refills | Status: DC

## 2021-02-27 MED ORDER — AMITRIPTYLINE HCL 10 MG PO TABS: 10.0000 mg | ORAL_TABLET | Freq: Every day | ORAL | 2 refills | Status: DC

## 2021-02-27 MED ORDER — DEXLANSOPRAZOLE 60 MG PO CPDR: 60.0000 mg | DELAYED_RELEASE_CAPSULE | Freq: Every day | ORAL | 2 refills | Status: DC

## 2021-02-27 NOTE — Progress Notes (Signed)
Subjective:    Patient ID: Theresa [name], female    DOB: 03-15-1971, 50 y.o.   MRN: [id number]  HPI Patient presents for several issues.  Has been having major flareups of her acid reflux and heartburn symptoms.  States she stopped her pantoprazole since it was no longer working.  Has seen GI specialist in the past.  Her last visit was in June 2021 for colonoscopy.  States she has had an endoscopy in the past as well.  Did well on Dexilant in the past.  Having frequent nausea states that is unassociated with any particular foods.  States she is fine after eating, nausea mainly begins when she stands up.  Occasional mild constipation, no diarrhea.  No bloody stools or change in color.  This morning ate an Egg McMuffin and had nausea for about 25 minutes afterwards.  States her blood sugar during this time was 142.  Her average sugars are 120s to 130s.  BP at home running averaging 140/90.  Adherent to blood pressure medication.  No chest pain/ischemic type pain or unusual shortness of breath.  Had 1 episode of falling recently.  No feelings of syncope and no loss of consciousness.  Denies any seizure activity.  States she does not understand why she fell.  No other incidences since then. In July 2022 patient pulled a muscle in her right mid back area while picking up debris.  Has had some issues with some pain in that area since then.  Is now radiating from there into the lower to mid right breast area.  Worse with certain movements.  Has had a chest x-ray and normal diagnostic mammogram/ultrasound since June. Patient ran out of her Adderall prescription about 2 weeks ago.  Denies any adverse effects including palpitations and difficulty sleeping.  Would like to restart her Adderall at the 20 mg dose which was working well. No personal history of pancreatitis.  No family history of endocrine cancers including medullary thyroid cancer.       Objective:   Physical Exam NAD.  Alert, oriented.  Calm  cheerful affect.  Making good eye contact.  Dressed appropriately.  Speech clear.  Thyroid nontender to palpation, no mass or goiter noted.  Lungs clear.  Heart regular rate rhythm.  Abdomen soft nondistended with distinct epigastric area tenderness to deep palpation.  Tenderness noted with palpation along the dermatomal line from the mid thoracic area into the flank area along the lower breast.  Right breast exam: No masses noted.  Recent Results (from the past 2160 hour(s))  CBC with Differential/Platelet     Status: None   Collection Time: 02/19/21 10:49 AM  Result Value Ref Range   WBC 5.8 3.4 - 10.8 x10E3/uL   RBC 4.28 3.77 - 5.28 x10E6/uL   Hemoglobin 12.7 11.1 - 15.9 g/dL   Hematocrit 38.2 34.0 - 46.6 %   MCV 89 79 - 97 fL   MCH 29.7 26.6 - 33.0 pg   MCHC 33.2 31.5 - 35.7 g/dL   RDW 12.6 11.7 - 15.4 %   Platelets 247 150 - 450 x10E3/uL   Neutrophils 57 Not Estab. %   Lymphs 29 Not Estab. %   Monocytes 9 Not Estab. %   Eos 4 Not Estab. %   Basos 1 Not Estab. %   Neutrophils Absolute 3.3 1.4 - 7.0 x10E3/uL   Lymphocytes Absolute 1.7 0.7 - 3.1 x10E3/uL   Monocytes Absolute 0.5 0.1 - 0.9 x10E3/uL   EOS (ABSOLUTE) 0.2 0.0 -  0.4 x10E3/uL   Basophils Absolute 0.0 0.0 - 0.2 x10E3/uL   Immature Granulocytes 0 Not Estab. %   Immature Grans (Abs) 0.0 0.0 - 0.1 x10E3/uL  Lipid panel     Status: Abnormal   Collection Time: 02/19/21 10:49 AM  Result Value Ref Range   Cholesterol, Total 206 (H) 100 - 199 mg/dL   Triglycerides 86 0 - 149 mg/dL   HDL 73 >39 mg/dL   VLDL Cholesterol Cal 15 5 - 40 mg/dL   LDL Chol Calc (NIH) 118 (H) 0 - 99 mg/dL   Chol/HDL Ratio 2.8 0.0 - 4.4 ratio    Comment:                                   T. Chol/HDL Ratio                                             Men  Women                               1/2 Avg.Risk  3.4    3.3                                   Avg.Risk  5.0    4.4                                2X Avg.Risk  9.6    7.1                                 3X Avg.Risk 23.4   11.0   Comprehensive metabolic panel     Status: None   Collection Time: 02/19/21 10:49 AM  Result Value Ref Range   Glucose 99 70 - 99 mg/dL   BUN 9 6 - 24 mg/dL   Creatinine, Ser 0.86 0.57 - 1.00 mg/dL   eGFR 83 >59 mL/min/1.73   BUN/Creatinine Ratio 10 9 - 23   Sodium 140 134 - 144 mmol/L   Potassium 4.3 3.5 - 5.2 mmol/L   Chloride 104 96 - 106 mmol/L   CO2 25 20 - 29 mmol/L   Calcium 10.0 8.7 - 10.2 mg/dL   Total Protein 7.0 6.0 - 8.5 g/dL   Albumin 4.3 3.8 - 4.8 g/dL   Globulin, Total 2.7 1.5 - 4.5 g/dL   Albumin/Globulin Ratio 1.6 1.2 - 2.2   Bilirubin Total 0.8 0.0 - 1.2 mg/dL   Alkaline Phosphatase 99 44 - 121 IU/L   AST 24 0 - 40 IU/L   ALT 22 0 - 32 IU/L   Reviewed labs with patient during visit.  Today's Vitals   02/27/21 1355  Temp: (!) 97.3 F (36.3 C)  SpO2: 98%  Weight: 234 lb (106.1 kg)  Height: '5\' 5"'  (1.651 m)   Body mass index is 38.94 kg/m. Has gained 14 pounds since December.       Assessment & Plan:   Problem List Items Addressed This Visit       Cardiovascular and Mediastinum   Essential hypertension,  benign - Primary     Digestive   Gastroesophageal reflux disease without esophagitis   Relevant Medications   dexlansoprazole (DEXILANT) 60 MG capsule     Other   Attention deficit disorder (ADD) without hyperactivity   Hyperglycemia   Relevant Orders   Hemoglobin A1c (Completed)   TSH (Completed)   Morbid obesity (HCC)   Relevant Medications   amphetamine-dextroamphetamine (ADDERALL XR) 20 MG 24 hr capsule   Other Visit Diagnoses     Fatigue, unspecified type       Relevant Orders   Hemoglobin A1c (Completed)   TSH (Completed)      Meds ordered this encounter  Medications   DISCONTD: amphetamine-dextroamphetamine (ADDERALL XR) 20 MG 24 hr capsule    Sig: Take 1 capsule (20 mg total) by mouth every morning.    Dispense:  30 capsule    Refill:  0    Order Specific Question:   Supervising Provider     Answer:   Sallee Lange A [[id number]]   DISCONTD: amphetamine-dextroamphetamine (ADDERALL XR) 20 MG 24 hr capsule    Sig: Take 1 capsule (20 mg total) by mouth every morning.    Dispense:  30 capsule    Refill:  0    May fill 30 days from 02/27/21    Order Specific Question:   Supervising Provider    Answer:   Sallee Lange A [[id number]]   amphetamine-dextroamphetamine (ADDERALL XR) 20 MG 24 hr capsule    Sig: Take 1 capsule (20 mg total) by mouth every morning.    Dispense:  30 capsule    Refill:  0    May fill 60 days from 02/27/21    Order Specific Question:   Supervising Provider    Answer:   Sallee Lange A [[id number]]   dexlansoprazole (DEXILANT) 60 MG capsule    Sig: Take 1 capsule (60 mg total) by mouth daily. For acid reflux    Dispense:  30 capsule    Refill:  2    Order Specific Question:   Supervising Provider    Answer:   Sallee Lange A [[id number]]   amitriptyline (ELAVIL) 10 MG tablet    Sig: Take 1 tablet (10 mg total) by mouth at bedtime. For nerve pain    Dispense:  30 tablet    Refill:  2    Order Specific Question:   Supervising Provider    Answer:   Sallee Lange A [[id number]]   Labs pending.  Discussed importance of regular activity, healthy diet and weight loss goal of 5 to 10 pounds. Encourage patient to look into the cost of a GLP-1 such as [place] to help with weight loss. Restart Adderall at 20 mg dose.  DC medicine and contact office if any problems. Restart Dexilant as directed if this will be covered by her insurance.  Patient to schedule appointment with GI specialist for recheck.  Contact the office if she needs our assistance. Start amitriptyline low-dose for neuropathic pain along the back area.  Call back if worsens or persist. Return in about 3 months (around 05/30/2021) for ADD check up.

## 2021-02-28 ENCOUNTER — Encounter: Payer: Self-pay | Admitting: Nurse Practitioner

## 2021-02-28 LAB — HEMOGLOBIN A1C
Est. average glucose Bld gHb Est-mCnc: 126 mg/dL
Hgb A1c MFr Bld: 6 % — ABNORMAL HIGH (ref 4.8–5.6)

## 2021-02-28 LAB — TSH: TSH: 1.98 u[IU]/mL (ref 0.450–4.500)

## 2021-03-02 NOTE — Telephone Encounter (Signed)
Patient was seen 02/27/21 by Hoyle Sauer

## 2021-03-03 ENCOUNTER — Telehealth: Payer: Self-pay | Admitting: Family Medicine

## 2021-03-03 NOTE — Telephone Encounter (Signed)
Please advise. Thank you

## 2021-03-03 NOTE — Telephone Encounter (Signed)
Patient is requesting a prescription for xanax to be called into [facility]

## 2021-03-17 ENCOUNTER — Other Ambulatory Visit: Payer: Self-pay | Admitting: Nurse Practitioner

## 2021-03-18 DIAGNOSIS — N390 Urinary tract infection, site not specified: Secondary | ICD-10-CM | POA: Diagnosis not present

## 2021-03-25 ENCOUNTER — Encounter: Payer: Self-pay | Admitting: Nurse Practitioner

## 2021-03-30 ENCOUNTER — Other Ambulatory Visit: Payer: Self-pay | Admitting: Nurse Practitioner

## 2021-03-30 MED ORDER — ALPRAZOLAM 0.5 MG PO TABS: ORAL_TABLET | ORAL | 0 refills | Status: DC

## 2021-03-31 DIAGNOSIS — S93492A Sprain of other ligament of left ankle, initial encounter: Secondary | ICD-10-CM | POA: Diagnosis not present

## 2021-03-31 DIAGNOSIS — S93491A Sprain of other ligament of right ankle, initial encounter: Secondary | ICD-10-CM | POA: Diagnosis not present

## 2021-04-06 NOTE — Telephone Encounter (Signed)
Patient stated she can not take the Xanax and function or work because it knocks her out

## 2021-04-07 ENCOUNTER — Other Ambulatory Visit: Payer: Self-pay

## 2021-04-07 ENCOUNTER — Ambulatory Visit: Payer: BC Managed Care – PPO | Admitting: Family Medicine

## 2021-04-07 ENCOUNTER — Encounter: Payer: Self-pay | Admitting: Family Medicine

## 2021-04-07 VITALS — BP 128/76 | HR 78 | Temp 97.3°F | Ht 65.0 in | Wt 234.0 lb

## 2021-04-07 DIAGNOSIS — K5900 Constipation, unspecified: Secondary | ICD-10-CM | POA: Diagnosis not present

## 2021-04-07 DIAGNOSIS — N390 Urinary tract infection, site not specified: Secondary | ICD-10-CM

## 2021-04-07 DIAGNOSIS — K5792 Diverticulitis of intestine, part unspecified, without perforation or abscess without bleeding: Secondary | ICD-10-CM | POA: Diagnosis not present

## 2021-04-07 LAB — POCT URINALYSIS DIPSTICK
Appearance: NORMAL
Bilirubin, UA: NEGATIVE
Blood, UA: NEGATIVE
Glucose, UA: NEGATIVE
Ketones, UA: NEGATIVE
Leukocytes, UA: NEGATIVE
Nitrite, UA: NEGATIVE
Odor: NORMAL
Protein, UA: NEGATIVE
Spec Grav, UA: 1.01 (ref 1.010–1.025)
Urobilinogen, UA: 0.2 E.U./dL
pH, UA: 6 (ref 5.0–8.0)

## 2021-04-07 MED ORDER — METRONIDAZOLE 500 MG PO TABS: 500.0000 mg | ORAL_TABLET | Freq: Three times a day (TID) | ORAL | 0 refills | Status: AC

## 2021-04-07 MED ORDER — CIPROFLOXACIN HCL 500 MG PO TABS: 500.0000 mg | ORAL_TABLET | Freq: Two times a day (BID) | ORAL | 0 refills | Status: AC

## 2021-04-07 MED ORDER — SUCRALFATE 1 G PO TABS: 1.0000 g | ORAL_TABLET | Freq: Three times a day (TID) | ORAL | 0 refills | Status: DC

## 2021-04-07 NOTE — Progress Notes (Signed)
   Subjective:    Patient ID: Theresa [name], female    DOB: [birth year], 50 y.o.   MRN: [id number]  HPI Follow up for dysuria currently on 3rd ABX Tx macrobid, still experiencing frequeqncy and urgency   Patient experiencing anxiety and possible diverticulitis flare, constipation, gerd Epigastric pain and discomfort some nausea in addition to this not feeling good denies high fever chills does relate abdominal pain discomfort left side abdominal pain in the lower groin area consistent with previous cases of diverticulitis Fairly resistant UTI on her third round of antibiotic.  Review of Systems     Objective:   Physical Exam Lungs are clear heart is regular flanks nontender lower abdomen tenderness on the left side no guarding or rebound   Urinalysis looks good under the microscope    Assessment & Plan:  Prolonged UTI with resistance to antibiotics now doing better Diverticulitis recommend antibiotics Cipro and Flagyl Cipro for the next 10 days Flagyl next 7 days follow-up if progressive troubles or problems warning signs discussed Carafate to add to her PPI if ongoing troubles referral to GI

## 2021-04-14 ENCOUNTER — Other Ambulatory Visit: Payer: Self-pay | Admitting: Family Medicine

## 2021-05-06 ENCOUNTER — Other Ambulatory Visit: Payer: Self-pay | Admitting: Family Medicine

## 2021-05-06 DIAGNOSIS — R109 Unspecified abdominal pain: Secondary | ICD-10-CM | POA: Diagnosis not present

## 2021-05-06 DIAGNOSIS — Z6838 Body mass index (BMI) 38.0-38.9, adult: Secondary | ICD-10-CM | POA: Diagnosis not present

## 2021-05-06 DIAGNOSIS — E669 Obesity, unspecified: Secondary | ICD-10-CM | POA: Diagnosis not present

## 2021-05-20 ENCOUNTER — Other Ambulatory Visit: Payer: Self-pay | Admitting: Family Medicine

## 2021-05-23 ENCOUNTER — Encounter: Payer: Self-pay | Admitting: Nurse Practitioner

## 2021-05-25 NOTE — Telephone Encounter (Signed)
This issue was handled via phone message on the patient's chart.  Additional clinical information regarding Theresa [name] will be on his chart cannot be on mother's MyChart account

## 2021-05-26 ENCOUNTER — Other Ambulatory Visit: Payer: Self-pay

## 2021-05-26 ENCOUNTER — Encounter: Payer: Self-pay | Admitting: Obstetrics and Gynecology

## 2021-05-26 ENCOUNTER — Ambulatory Visit (INDEPENDENT_AMBULATORY_CARE_PROVIDER_SITE_OTHER): Payer: BC Managed Care – PPO | Admitting: Obstetrics and Gynecology

## 2021-05-26 ENCOUNTER — Other Ambulatory Visit (HOSPITAL_COMMUNITY)
Admission: RE | Admit: 2021-05-26 | Discharge: 2021-05-26 | Disposition: A | Discharge status: Home / Self Care | Payer: BC Managed Care – PPO | Source: Ambulatory Visit | Attending: Obstetrics and Gynecology | Admitting: Obstetrics and Gynecology

## 2021-05-26 VITALS — BP 95/58 | HR 67 | Ht 65.0 in | Wt 221.0 lb

## 2021-05-26 DIAGNOSIS — N3281 Overactive bladder: Secondary | ICD-10-CM

## 2021-05-26 DIAGNOSIS — R350 Frequency of micturition: Secondary | ICD-10-CM | POA: Insufficient documentation

## 2021-05-26 DIAGNOSIS — R82998 Other abnormal findings in urine: Secondary | ICD-10-CM

## 2021-05-26 DIAGNOSIS — N390 Urinary tract infection, site not specified: Secondary | ICD-10-CM | POA: Diagnosis not present

## 2021-05-26 DIAGNOSIS — N393 Stress incontinence (female) (male): Secondary | ICD-10-CM

## 2021-05-26 LAB — POCT URINALYSIS DIPSTICK
Appearance: NORMAL
Bilirubin, UA: NEGATIVE
Blood, UA: NEGATIVE
Glucose, UA: NEGATIVE
Ketones, UA: NEGATIVE
Nitrite, UA: POSITIVE
Protein, UA: NEGATIVE
Spec Grav, UA: 1.01 (ref 1.010–1.025)
Urobilinogen, UA: 0.2 E.U./dL
pH, UA: 5.5 (ref 5.0–8.0)

## 2021-05-26 MED ORDER — ESTRADIOL 0.1 MG/GM VA CREA: 0.5000 g | TOPICAL_CREAM | VAGINAL | 11 refills | Status: DC

## 2021-05-26 MED ORDER — MIRABEGRON ER 25 MG PO TB24: 25.0000 mg | ORAL_TABLET | Freq: Every day | ORAL | 5 refills | Status: DC

## 2021-05-26 NOTE — Patient Instructions (Signed)
We discussed the symptoms of overactive bladder (OAB), which include urinary urgency, urinary frequency, night-time urination, with or without urge incontinence.  We discussed management including behavioral therapy (decreasing bladder irritants by following a bladder diet, urge suppression strategies, timed voids, bladder retraining), physical therapy, medication.  For Beta-3 agonist medication, we discussed the potential side effect of elevated blood pressure which is more likely to occur in individuals with uncontrolled hypertension. You were given  Myrbetriq 25 mg.  It can take a month to start working so give it time, but if you have bothersome side effects call sooner and we can try a different medication.  Call us if you have trouble filling the prescription or if it's not covered by your insurance.  Start vaginal estrogen therapy nightly for two weeks then 2 times weekly at night for treatment of vaginal atrophy (dryness of the vaginal tissues).  Please let us know if the prescription is too expensive and we can look for alternative options.    For treatment of recurrent urinary tract infections, we discussed management of recurrent UTIs including prophylaxis with a daily low dose antibiotic, transvaginal estrogen therapy, D-mannose, and cranberry supplements.

## 2021-05-26 NOTE — Progress Notes (Signed)
Urogynecology New Patient Evaluation and Consultation  Referring Provider: Kathyrn Drown, MD PCP: Kathyrn Drown, MD Date of Service: 05/26/2021  SUBJECTIVE Chief Complaint: New Patient (Initial Visit)  History of Present Illness: Theresa [name] is a 51 y.o. White or Caucasian female seen in consultation at the request of Dr. Wolfgang Phoenix for evaluation of recurrent UTI.    Review of records from Dr Wolfgang Phoenix significant for: Has had resistant urinary tract infection requiring multiple antibiotics. No cultures noted in the system.   Urinary Symptoms: Leaks urine with laughing, exercise, with movement to the bathroom, and with urgency. UUI> SUI (depends on if she is coughing) Leaks- occasionally, not every day She is bothered by her UI symptoms. Has had 3 bladder sling surgeries- mesh placement, urethral injury and mesh removal.   Day time voids 6-8.  Nocturia: 1-2 times per night to void. Voiding dysfunction: she empties her bladder well.  does not use a catheter to empty bladder.  When urinating, she feels a weak stream, difficulty starting urine stream, and dribbling after finishing Drinks: 1-4 cups coffee, water   UTIs: 5 UTI's in the last year.  Had a positive urine culture from [facility] with E.Coli. Notices that when she does not drink a lot of water then she starts having burning with urination.  Reports history of blood in urine and pyelonephritis  Pelvic Organ Prolapse Symptoms:                  She Denies a feeling of a bulge the vaginal area.   Bowel Symptom: Bowel movements: variable Stool consistency: hard Straining: yes.  Splinting: yes.  Incomplete evacuation: yes.  She Denies accidental bowel leakage / fecal incontinence Bowel regimen: diet, fiber, stool softener (100mg  x2), and miralax   Sexual Function Sexually active: yes.  Sexual orientation:  heterosexual Pain with sex: Yes, deep in the pelvis, has discomfort due to  prolapse  Pelvic Pain Denies pelvic pain   Past Medical History:  Past Medical History:  Diagnosis Date   Anxiety    Depression    Fibromyalgia    GERD (gastroesophageal reflux disease)    Headache(784.0)    Heart murmur    Hyperplastic colon polyp 10/28/2019   Dr Sydell Axon recommends repeat colonoscopy in 2028.   IFG (impaired fasting glucose)    Obsessive-compulsive disorder    Osteoarthritis    Seizures ([place])    last seizure at age 61-stressed induced seizure.     Past Surgical History:   Past Surgical History:  Procedure Laterality Date   ABDOMINAL HYSTERECTOMY     still has right ovary   BIOPSY N/A 03/05/2014   Procedure: DUODENAL AND GASTRIC BIOPSY;  Surgeon: Danie Binder, MD;  Location: [facility];  Service: Endoscopy;  Laterality: N/A;   bladder sling X 3     CHILECTOMY Left 03/12/2020   Procedure: CHILECTOMY HALLUX;  Surgeon: Tyson Babinski, DPM;  Location: [facility];  Service: Podiatry;  Laterality: Left;   CHOLECYSTECTOMY     COLONOSCOPY  04/24/2012   GNF:AOZHYQM polyp measuring 5 mm in size was found in the proximal transverse colon; polypectomy was performed with cold forceps Pedunculated polyp measuring 1.1 cm in size was found in the sigmoid colon; polypectomy was performed using snare cautery Moderate diverticulosis was noted in the descending colon and sigmoid colon small internal hemorrhoids. next tcs 04/2017   COLONOSCOPY WITH PROPOFOL N/A 10/23/2019   Procedure: COLONOSCOPY WITH PROPOFOL;  Surgeon: Daneil Dolin, MD;  Location: [facility];  Service: Endoscopy;  Laterality: N/A;  12:30pm   ESOPHAGOGASTRODUODENOSCOPY N/A 02/18/2014   Procedure: ESOPHAGOGASTRODUODENOSCOPY (EGD);  Surgeon: Danie Binder, MD;  Location: [facility];  Service: Endoscopy;  Laterality: N/A;  115   ESOPHAGOGASTRODUODENOSCOPY (EGD) WITH PROPOFOL N/A 03/05/2014   SLF: 1. dyspepsia due to constipation, gastritis, and GERD 2. Multiple Gastric polyps 3. Moderate non-erosive gastritis  4. Diverticulum in the 2nd part of the duodenum   ESOPHAGOGASTRODUODENOSCOPY (EGD) WITH PROPOFOL N/A 10/23/2019   Procedure: ESOPHAGOGASTRODUODENOSCOPY (EGD) WITH PROPOFOL;  Surgeon: Daneil Dolin, MD;  Location: [facility];  Service: Endoscopy;  Laterality: N/A;   MALONEY DILATION N/A 10/23/2019   Procedure: Venia Minks DILATION;  Surgeon: Daneil Dolin, MD;  Location: [facility];  Service: Endoscopy;  Laterality: N/A;   POLYPECTOMY  10/23/2019   Procedure: POLYPECTOMY;  Surgeon: Daneil Dolin, MD;  Location: [facility];  Service: Endoscopy;;   TUBAL LIGATION       Past OB/GYN History: OB History  Gravida Para Term Preterm AB Living  2 2     0 2  SAB IAB Ectopic Multiple Live Births      0   2    # Outcome Date GA Lbr Len/2nd Weight Sex Delivery Anes PTL Lv  2 Para           1 Para            S/p hysterectomy   Medications: She has a current medication list which includes the following prescription(s): amitriptyline, calcium-vitamin d-vitamin k, dexlansoprazole, docusate sodium, [START ON 05/28/2021] estradiol, lisinopril-hydrochlorothiazide, magnesium, mirabegron er, ondansetron, OVER THE COUNTER MEDICATION, polyethylene glycol 3350, potassium, turmeric, valacyclovir, sucralfate, and [DISCONTINUED] fluticasone.   Allergies: Patient is allergic to fentanyl and prednisone.   Social History:  Social History   Tobacco Use   Smoking status: Never   Smokeless tobacco: Never  Vaping Use   Vaping Use: Never used  Substance Use Topics   Alcohol use: Yes    Comment: occas   Drug use: No    Relationship status: single She lives alone She is employed as a Psychologist, educational.   Family History:   Family History  Problem Relation Age of Onset   Alcohol abuse Mother    Depression Mother    Anxiety disorder Mother    Paranoid behavior Mother    Hypertension Mother    Diabetes Mother    Breast cancer Mother    Alcohol abuse Father    Hypertension Father    Diabetes Father     Physical abuse Brother    Healthy Daughter    GER disease Son    Healthy Son    Colon cancer Neg Hx    ADD / ADHD Neg Hx    Bipolar disorder Neg Hx    Dementia Neg Hx    OCD Neg Hx    Drug abuse Neg Hx    Schizophrenia Neg Hx    Seizures Neg Hx    Sexual abuse Neg Hx      Review of Systems: Review of Systems  Constitutional:  Positive for malaise/fatigue. Negative for fever and weight loss.  Respiratory:  Positive for cough. Negative for shortness of breath and wheezing.   Cardiovascular:  Negative for chest pain, palpitations and leg swelling.  Gastrointestinal:  Negative for abdominal pain and blood in stool.  Genitourinary:  Negative for dysuria.  Musculoskeletal:  Negative for myalgias.  Skin:  Negative for rash.  Neurological:  Negative for dizziness and headaches.  Endo/Heme/Allergies:  Does not bruise/bleed easily.  Psychiatric/Behavioral:  Negative for depression. The patient is not nervous/anxious.     OBJECTIVE Physical Exam: Vitals:   05/26/21 1603  BP: (!) 95/58  Pulse: 67  Weight: 221 lb (100.2 kg)  Height: 5\' 5"  (1.651 m)    Physical Exam Constitutional:      General: She is not in acute distress. Pulmonary:     Effort: Pulmonary effort is normal.  Abdominal:     General: There is no distension.     Palpations: Abdomen is soft.     Tenderness: There is no abdominal tenderness. There is no rebound.  Musculoskeletal:        General: No swelling. Normal range of motion.  Skin:    General: Skin is warm and dry.     Findings: No rash.  Neurological:     Mental Status: She is alert and oriented to person, place, and time.  Psychiatric:        Mood and Affect: Mood normal.        Behavior: Behavior normal.     GU / Detailed Urogynecologic Evaluation:  Pelvic Exam: Normal external female genitalia; Bartholin's and Skene's glands normal in appearance; urethral meatus normal in appearance, no urethral masses or discharge.   CST: negative   s/p  hysterectomy: Speculum exam reveals normal vaginal mucosa with  atrophy and normal vaginal cuff.  Adnexa no mass, fullness, tenderness.    Pelvic floor strength II/V  Pelvic floor musculature: Right levator non-tender, Right obturator non-tender, Left levator non-tender, Left obturator non-tender  POP-Q:   POP-Q  -2                                            Aa   -2                                           Ba  7.5                                              C   4                                            Gh  4                                            Pb  8.5                                            tvl   -0.5  Ap  -0.5                                            Bp                                                 D     Rectal Exam:  Normal external rectum  Post-Void Residual (PVR) by Bladder Scan: In order to evaluate bladder emptying, we discussed obtaining a postvoid residual and she agreed to this procedure.  Procedure: The ultrasound unit was placed on the patient's abdomen in the suprapubic region after the patient had voided. A PVR of 35 ml was obtained by bladder scan.  Laboratory Results: POC urine: trace leukocytes, positive nitrites  ASSESSMENT AND PLAN Ms. Sarchet is a 51 y.o. with:  1. Recurrent UTI   2. Leukocytes in urine   3. Overactive bladder   4. Urinary frequency   5. SUI (stress urinary incontinence, female)     Recurrent UTI - For treatment of recurrent urinary tract infections, we discussed management of recurrent UTIs including prophylaxis with a daily low dose antibiotic, transvaginal estrogen therapy, D-mannose, and cranberry supplements.   - She prefers not to start antibiotics at this time. Will start estrace cream 0.5g nightly for two weeks then twice a week after for prophylaxis - Will also look into D-mannose and cranberry  2. OAB - We discussed the symptoms of overactive bladder  (OAB), which include urinary urgency, urinary frequency, nocturia, with or without urge incontinence.  While we do not know the exact etiology of OAB, several treatment options exist. We discussed management including behavioral therapy (decreasing bladder irritants, urge suppression strategies, timed voids, bladder retraining), physical therapy, medication; for refractory cases posterior tibial nerve stimulation, sacral neuromodulation, and intravesical botulinum toxin injection.  - Prescribed myrbetriq 25mg . For Beta-3 agonist medication, we discussed the potential side effect of elevated blood pressure which is more likely to occur in individuals with uncontrolled hypertension.   3. SUI - history of sling and sling revision/ mesh removal -For treatment of stress urinary incontinence,  non-surgical options include expectant management, weight loss, physical therapy, as well as a pessary.  Surgical options include a Burch urethropexy, and transurethral injection of a bulking agent. - She would like to defer any treatment at this time  Return 6 weeks for follow up   Jaquita Folds, MD   Medical Decision Making:  - Reviewed/ ordered a clinical laboratory test - Review and summation of prior records

## 2021-05-27 ENCOUNTER — Encounter: Payer: Self-pay | Admitting: Obstetrics and Gynecology

## 2021-05-28 LAB — URINE CULTURE: Culture: >=100000 — AB

## 2021-05-28 MED ORDER — NITROFURANTOIN MONOHYD MACRO 100 MG PO CAPS: 100.0000 mg | ORAL_CAPSULE | Freq: Two times a day (BID) | ORAL | 0 refills | Status: AC

## 2021-05-28 NOTE — Progress Notes (Signed)
Theresa [name] is a 51 y.o. female was contacted and notified of the message below. Pt verbalized understanding.

## 2021-05-28 NOTE — Addendum Note (Signed)
Addended by: Jaquita Folds on: 05/28/2021 01:08 PM   Modules accepted: Orders

## 2021-06-03 DIAGNOSIS — Z6838 Body mass index (BMI) 38.0-38.9, adult: Secondary | ICD-10-CM | POA: Diagnosis not present

## 2021-06-03 DIAGNOSIS — K5909 Other constipation: Secondary | ICD-10-CM | POA: Diagnosis not present

## 2021-06-23 ENCOUNTER — Other Ambulatory Visit: Payer: Self-pay | Admitting: Nurse Practitioner

## 2021-06-26 ENCOUNTER — Encounter: Payer: Self-pay | Admitting: Nurse Practitioner

## 2021-06-26 ENCOUNTER — Ambulatory Visit: Payer: BC Managed Care – PPO | Admitting: Nurse Practitioner

## 2021-06-26 ENCOUNTER — Other Ambulatory Visit: Payer: Self-pay

## 2021-06-26 VITALS — BP 117/85 | HR 75 | Temp 98.5°F | Ht 65.0 in | Wt 219.8 lb

## 2021-06-26 DIAGNOSIS — F419 Anxiety disorder, unspecified: Secondary | ICD-10-CM

## 2021-06-26 DIAGNOSIS — G4762 Sleep related leg cramps: Secondary | ICD-10-CM | POA: Diagnosis not present

## 2021-06-26 DIAGNOSIS — K581 Irritable bowel syndrome with constipation: Secondary | ICD-10-CM

## 2021-06-26 DIAGNOSIS — F32A Depression, unspecified: Secondary | ICD-10-CM

## 2021-06-26 DIAGNOSIS — Z79899 Other long term (current) drug therapy: Secondary | ICD-10-CM

## 2021-06-26 NOTE — Progress Notes (Signed)
Subjective:    Patient ID: Theresa [name], female    DOB: 1970/10/25, 51 y.o.   MRN: [id number]  HPI  Patient says she gets in a "funk" this time of the year. She says she goes on an antidepressant for a few months then will be fine.  States she has taken medication in the past, unsure about which one but this has helped for a few months and then symptoms were resolved.  Remains any good relationship with her boyfriend.  Has a long-term issue with chronic constipation due to IBS-C.  Is interested in taking medication to help.  Had a colonoscopy in June 2021.  Is currently on a modified Atkins diet and has been successful in losing some weight.  Also complaints of bilateral leg cramps in the lower extremities at times.  Has been taking a dose of mustard which has helped.  Denies any new or repetitive activity adding to her symptoms.  Review of Systems  Respiratory:  Negative for cough, chest tightness and shortness of breath.   Cardiovascular:  Negative for chest pain and leg swelling.  Gastrointestinal:  Positive for constipation. Negative for blood in stool, diarrhea, nausea and vomiting.  Depression screen [facility] 2/9 06/26/2021  Decreased Interest 2  Down, Depressed, Hopeless 2  PHQ - 2 Score 4  Altered sleeping 3  Tired, decreased energy 3  Change in appetite 0  Feeling bad or failure about yourself  0  Trouble concentrating 3  Moving slowly or fidgety/restless 2  Suicidal thoughts 0  PHQ-9 Score 15  Difficult doing work/chores Somewhat difficult  Some recent data might be hidden   GAD 7 : Generalized Anxiety Score 06/26/2021 04/07/2021 04/04/2020  Nervous, Anxious, on Edge 2 1 2   Control/stop worrying 3 3 3   Worry too much - different things 3 3 3   Trouble relaxing 2 2 3   Restless 1 0 1  Easily annoyed or irritable 2 1 2   Afraid - awful might happen 2 1 2   Total GAD 7 Score 15 11 16   Anxiety Difficulty Very difficult Somewhat difficult Very difficult        Objective:    Physical Exam NAD.  Alert, oriented.  Lungs clear.  Heart regular rate rhythm.  Abdomen soft nondistended nontender.  No tenderness with palpation of the calf area.  Gait normal.  Lower extremities no edema.  Calm cheerful affect.  Making good eye contact.  Dressed appropriately.  Thoughts logical coherent and relevant.  Speech clear. Today's Vitals   06/26/21 1043  BP: 117/85  Pulse: 75  Temp: 98.5 F (36.9 C)  TempSrc: Oral  SpO2: 97%  Weight: 219 lb 12.8 oz (99.7 kg)  Height: 5\' 5"  (1.651 m)   Body mass index is 36.58 kg/m.       Assessment & Plan:   Problem List Items Addressed This Visit       Digestive   Irritable bowel syndrome with constipation     Other   Anxiety and depression - Primary   Nocturnal leg cramps   Other Visit Diagnoses     High risk medication use       Relevant Orders   Comprehensive Metabolic Panel (CMET) (Completed)   Magnesium (Completed)      Chart was reviewed and message sent to patient regarding restarting Zoloft and Linzess.  Response pending. Labs ordered due to leg cramps. Discussed importance of regular activity and stress reduction. Continue weight loss efforts. Medications will be prescribed once we hear  back from the patient. Return in about 3 months (around 09/23/2021). Call back sooner if needed.

## 2021-06-27 LAB — COMPREHENSIVE METABOLIC PANEL
ALT: 32 IU/L (ref 0–32)
AST: 29 IU/L (ref 0–40)
Albumin/Globulin Ratio: 1.8 (ref 1.2–2.2)
Albumin: 4.5 g/dL (ref 3.8–4.8)
Alkaline Phosphatase: 87 IU/L (ref 44–121)
BUN/Creatinine Ratio: 18 (ref 9–23)
BUN: 13 mg/dL (ref 6–24)
Bilirubin Total: 0.6 mg/dL (ref 0.0–1.2)
CO2: 24 mmol/L (ref 20–29)
Calcium: 9.8 mg/dL (ref 8.7–10.2)
Chloride: 100 mmol/L (ref 96–106)
Creatinine, Ser: 0.74 mg/dL (ref 0.57–1.00)
Globulin, Total: 2.5 g/dL (ref 1.5–4.5)
Glucose: 111 mg/dL — ABNORMAL HIGH (ref 70–99)
Potassium: 4.6 mmol/L (ref 3.5–5.2)
Sodium: 139 mmol/L (ref 134–144)
Total Protein: 7 g/dL (ref 6.0–8.5)
eGFR: 99 mL/min/{1.73_m2} (ref >59)

## 2021-06-27 LAB — MAGNESIUM: Magnesium: 1.9 mg/dL (ref 1.6–2.3)

## 2021-06-28 ENCOUNTER — Other Ambulatory Visit: Payer: Self-pay | Admitting: Nurse Practitioner

## 2021-06-28 DIAGNOSIS — G4762 Sleep related leg cramps: Secondary | ICD-10-CM | POA: Insufficient documentation

## 2021-06-28 MED ORDER — SERTRALINE HCL 50 MG PO TABS: 50.0000 mg | ORAL_TABLET | Freq: Every day | ORAL | 2 refills | Status: DC

## 2021-06-28 MED ORDER — LINACLOTIDE 290 MCG PO CAPS: 290.0000 ug | ORAL_CAPSULE | Freq: Every day | ORAL | 2 refills | Status: DC

## 2021-06-29 ENCOUNTER — Telehealth: Payer: Self-pay | Admitting: *Deleted

## 2021-06-29 NOTE — Telephone Encounter (Signed)
Linzess is non formulary under patient's insurance plan. Patient's insurance states they will not cover Linzess unless the patient has tried and failed Trulance and has a dx of IBS with constipation. See no record of recent trial and failure on Trulance- Please advise

## 2021-07-01 DIAGNOSIS — Z6838 Body mass index (BMI) 38.0-38.9, adult: Secondary | ICD-10-CM | POA: Diagnosis not present

## 2021-07-03 ENCOUNTER — Other Ambulatory Visit: Payer: Self-pay | Admitting: Nurse Practitioner

## 2021-07-03 MED ORDER — TRULANCE 3 MG PO TABS: ORAL_TABLET | ORAL | 2 refills | Status: DC

## 2021-07-03 NOTE — Telephone Encounter (Signed)
Mychart message sent.

## 2021-07-07 ENCOUNTER — Ambulatory Visit: Payer: BC Managed Care – PPO | Admitting: Obstetrics and Gynecology

## 2021-07-15 DIAGNOSIS — H524 Presbyopia: Secondary | ICD-10-CM | POA: Diagnosis not present

## 2021-07-15 DIAGNOSIS — H04123 Dry eye syndrome of bilateral lacrimal glands: Secondary | ICD-10-CM | POA: Diagnosis not present

## 2021-07-15 DIAGNOSIS — H2513 Age-related nuclear cataract, bilateral: Secondary | ICD-10-CM | POA: Diagnosis not present

## 2021-07-15 DIAGNOSIS — H5203 Hypermetropia, bilateral: Secondary | ICD-10-CM | POA: Diagnosis not present

## 2021-07-19 ENCOUNTER — Other Ambulatory Visit: Payer: Self-pay | Admitting: Nurse Practitioner

## 2021-07-20 MED ORDER — DEXLANSOPRAZOLE 60 MG PO CPDR: DELAYED_RELEASE_CAPSULE | ORAL | 0 refills | Status: DC

## 2021-07-27 DIAGNOSIS — E669 Obesity, unspecified: Secondary | ICD-10-CM | POA: Diagnosis not present

## 2021-08-03 DIAGNOSIS — K5792 Diverticulitis of intestine, part unspecified, without perforation or abscess without bleeding: Secondary | ICD-10-CM | POA: Diagnosis not present

## 2021-08-04 ENCOUNTER — Ambulatory Visit: Payer: BC Managed Care – PPO | Admitting: Nurse Practitioner

## 2021-08-21 ENCOUNTER — Emergency Department (HOSPITAL_COMMUNITY): Payer: BC Managed Care – PPO

## 2021-08-21 ENCOUNTER — Emergency Department (HOSPITAL_COMMUNITY)
Admission: EM | Admit: 2021-08-21 | Discharge: 2021-08-21 | Disposition: A | Discharge status: Home / Self Care | Payer: BC Managed Care – PPO | Attending: Emergency Medicine | Admitting: Emergency Medicine

## 2021-08-21 DIAGNOSIS — M79605 Pain in left leg: Secondary | ICD-10-CM | POA: Diagnosis not present

## 2021-08-21 DIAGNOSIS — M545 Low back pain, unspecified: Secondary | ICD-10-CM | POA: Diagnosis not present

## 2021-08-21 DIAGNOSIS — X501XXA Overexertion from prolonged static or awkward postures, initial encounter: Secondary | ICD-10-CM | POA: Diagnosis not present

## 2021-08-21 MED ORDER — HYDROCODONE-ACETAMINOPHEN 5-325 MG PO TABS
2.0000 | ORAL_TABLET | Freq: Once | ORAL | Status: AC
  Administered 2021-08-21: 2 via ORAL
  Filled 2021-08-21: qty 2

## 2021-08-21 MED ORDER — NAPROXEN 375 MG PO TABS: 375.0000 mg | ORAL_TABLET | Freq: Two times a day (BID) | ORAL | 0 refills | Status: DC

## 2021-08-21 MED ORDER — KETOROLAC TROMETHAMINE 30 MG/ML IJ SOLN
30.0000 mg | Freq: Once | INTRAMUSCULAR | Status: AC
  Administered 2021-08-21: 30 mg via INTRAMUSCULAR
  Filled 2021-08-21: qty 1

## 2021-08-21 MED ORDER — LIDOCAINE 5 % EX PTCH
1.0000 | MEDICATED_PATCH | Freq: Once | CUTANEOUS | Status: DC
  Administered 2021-08-21: 1 via TRANSDERMAL
  Filled 2021-08-21: qty 1

## 2021-08-21 MED ORDER — METHOCARBAMOL 500 MG PO TABS: 500.0000 mg | ORAL_TABLET | Freq: Every evening | ORAL | 0 refills | Status: AC

## 2021-08-21 NOTE — ED Triage Notes (Signed)
Pt in c/o Lower back pain onset today after bending over, pt denies injury, pt ambulatory with pain, denies bowel & bladder incontinence, A&O x4 ?

## 2021-08-21 NOTE — ED Provider Notes (Signed)
?Welcome ?Provider Note ? ? ?CSN: [id number] ?Arrival date & time: 08/21/21  1554 ? ?  ? ?History ? ?Chief Complaint  ?Patient presents with  ? Back Pain  ? ? ?Theresa [name] is a 51 y.o. female with chief complaint of lower back pain started today after she bent over to pick something up.  Denies blunt trauma.  Denies urinary/bowel incontinence.  Denies saddle anesthesia.  Endorses mild diminished sensation of the left outer thigh that she felt before when she had an exacerbation of lower back pain like this years ago.  Hx of prior low back pain few years ago due to skiing injury.  No prior back or orthopedic surgeries.  Pain is worse with movement, described as sharp.  Tried heating, anti-inflammatories, and rest, but the pain is only worsened.  Denies recent fever, IV drug use, or hemoptysis. ? ?The history is provided by the patient and medical records.  ?Back Pain ? ?  ? ?Home Medications ?Prior to Admission medications   ?Medication Sig Start Date End Date Taking? Authorizing Provider  ?methocarbamol (ROBAXIN) 500 MG tablet Take 1 tablet (500 mg total) by mouth at bedtime for 7 days. 08/21/21 08/28/21 Yes Prince Rome, PA-C  ?naproxen (NAPROSYN) 375 MG tablet Take 1 tablet (375 mg total) by mouth 2 (two) times daily. 08/21/21  Yes Prince Rome, PA-C  ?CALCIUM-VITAMIN D-VITAMIN K PO Take 2 tablets by mouth daily.    [provider]  ?dexlansoprazole (DEXILANT) 60 MG capsule TAKE 1 CAPSULE BY MOUTH ONCE DAILY 07/20/21   Kathyrn Drown, MD  ?docusate sodium (COLACE) 100 MG capsule Take 100 mg by mouth 2 (two) times daily.    [provider]  ?lisinopril-hydrochlorothiazide (ZESTORETIC) 20-25 MG tablet TAKE 1 TABLET BY MOUTH ONCE DAILY. 05/21/21   Kathyrn Drown, MD  ?Magnesium 250 MG TABS 1 tablet with a meal    [provider]  ?ondansetron (ZOFRAN) 8 MG tablet TAKE  (1)  TABLET  THREE TIMES DAILY AS NEEDED FOR NAUSEA. 05/06/21   Kathyrn Drown, MD   ?Plecanatide (TRULANCE) 3 MG TABS Take one tab po qd for constipation 07/03/21   Nilda Simmer, NP  ?Polyethylene Glycol 3350 (MIRALAX PO) Take by mouth.    [provider]  ?Potassium (POTASSIMIN PO) Take by mouth.    [provider]  ?Prenatal Vit-Fe Fumarate-FA (PRENATAL VITAMIN PO) Take by mouth.    [provider]  ?sertraline (ZOLOFT) 50 MG tablet Take 1 tablet (50 mg total) by mouth daily. 06/28/21   Nilda Simmer, NP  ?TURMERIC PO Take by mouth daily. Tea with ginger    [provider]  ?valACYclovir (VALTREX) 1000 MG tablet TAKE 2 TABLETS BY MOUTH NOW AND TAKE 2 MORE TABLETS 12 HOURS LATER. 04/14/21   Kathyrn Drown, MD  ?fluticasone (FLONASE) 50 MCG/ACT nasal spray Place 2 sprays into both nostrils daily. ?Patient not taking: Reported on 10/11/2019 09/29/19 10/14/19  Lestine Box, PA-C  ?   ? ?Allergies    ?Fentanyl and Prednisone   ? ?Review of Systems   ?Review of Systems  ?Musculoskeletal:  Positive for back pain.  ? ?Physical Exam ?Updated Vital Signs ?BP (!) 132/98 (BP Location: Left Arm)   Pulse 83   Temp 98.1 ?F (36.7 ?C) (Oral)   Resp 14   Ht '5\' 5"'$  (1.651 m)   SpO2 98%   BMI 36.58 kg/m?  ?Physical Exam ?Vitals and nursing note reviewed.  ?Constitutional:   ?  General: She is not in acute distress. ?   Appearance: Normal appearance. She is well-developed. She is not ill-appearing or diaphoretic.  ?HENT:  ?   Head: Normocephalic and atraumatic.  ?   Mouth/Throat:  ?   Mouth: Mucous membranes are moist.  ?   Pharynx: Oropharynx is clear.  ?Eyes:  ?   Conjunctiva/sclera: Conjunctivae normal.  ?Cardiovascular:  ?   Rate and Rhythm: Normal rate and regular rhythm.  ?   Pulses: Normal pulses.  ?Pulmonary:  ?   Effort: Pulmonary effort is normal. No respiratory distress.  ?   Breath sounds: Normal breath sounds.  ?Abdominal:  ?   General: Bowel sounds are normal.  ?   Palpations: Abdomen is soft.  ?   Tenderness: There is no abdominal tenderness.   ?Musculoskeletal:     ?   General: No swelling.  ?     Arms: ? ?   Cervical back: Neck supple. No rigidity.  ?   Comments: Lower back tenderness as depicted above ?Without evidence of obvious deformity, crepitus, open or obvious trauma, laceration, edema, discoloration or rash ?Unable to adequately assess for SLR or deep tendon reflexes due to subjective pain ?Lower extremities appear neurovascularly intact with exception of the mild subjective diminished sensation of the left lateral thigh  ?Skin: ?   General: Skin is warm and dry.  ?   Capillary Refill: Capillary refill takes less than 2 seconds.  ?Neurological:  ?   Mental Status: She is alert and oriented to person, place, and time.  ?   GCS: GCS eye subscore is 4. GCS verbal subscore is 5. GCS motor subscore is 6.  ?   Cranial Nerves: No dysarthria.  ?Psychiatric:     ?   Mood and Affect: Mood normal.  ? ? ?ED Results / Procedures / Treatments   ?Labs ?(all labs ordered are listed, but only abnormal results are displayed) ?Labs Reviewed - No data to display ? ?EKG ?None ? ?Radiology ?DG Lumbar Spine Complete ? ?Result Date: 08/21/2021 ?CLINICAL DATA:  Acute lower back pain after bending over. EXAM: LUMBAR SPINE - COMPLETE 4+ VIEW COMPARISON:  None. FINDINGS: A fracture deformity of indeterminate age is suspected along the inferior endplate of the L5 vertebral body. Alignment is normal. Mild to moderate severity endplate sclerosis is seen at L4-L5 and L5-S1. Mild to moderate severity intervertebral disc space narrowing is noted at L5-S1. IMPRESSION: 1. Fracture deformity of indeterminate age along the inferior endplate of the L5 vertebral body. MRI correlation is recommended. 2. Mild to moderate degenerative disc disease at L4-L5 and L5-S1. Electronically Signed   By: Virgina Norfolk M.D.   On: 08/21/2021 17:21  ? ?MR Lumbar Spine Wo Contrast ? ?Result Date: 08/21/2021 ?CLINICAL DATA:  Low back pain after bending over EXAM: MRI LUMBAR SPINE WITHOUT CONTRAST  TECHNIQUE: Multiplanar, multisequence MR imaging of the lumbar spine was performed. No intravenous contrast was administered. COMPARISON:  No prior MRI, correlation is made with lumbar spine radiographs 08/21/2021 and CT abdomen pelvis 08/13/2019 FINDINGS: Segmentation:  Standard. Alignment: Straightening of the normal lumbar lordosis. Trace retrolisthesis of L5 on S1. Vertebrae:  No acute fracture or suspicious osseous lesion. Conus medullaris and cauda equina: Conus extends to the L2 level. Conus and cauda equina appear normal. Paraspinal and other soft tissues: Negative. Disc levels: T12-L1: No significant disc bulge. No spinal canal stenosis or neural foraminal narrowing. L1-L2: No significant disc bulge. No spinal canal stenosis or neural foraminal narrowing. L2-L3:  Mild disc bulge. No spinal canal stenosis or neural foraminal narrowing. L3-L4: Mild disc bulge. No spinal canal stenosis or neural foraminal narrowing. L4-L5: Mild disc bulge with central/right paracentral annular fissure. No spinal canal stenosis or neural foraminal narrowing. L5-S1: Mild disc bulge with left foraminal disc protrusion. This narrows the left lateral recess. No spinal canal stenosis or neural foraminal narrowing. IMPRESSION: 1. Mild degenerative changes, without spinal canal stenosis or neural foraminal narrowing. 2. Narrowing of the left lateral recess at L5-S1, which could affect the descending left S1 nerve roots. Electronically Signed   By: Merilyn Baba M.D.   On: 08/21/2021 18:40   ? ?Procedures ?Procedures  ? ? ?Medications Ordered in ED ?Medications  ?ketorolac (TORADOL) 30 MG/ML injection 30 mg (30 mg Intramuscular Given 08/21/21 1727)  ?HYDROcodone-acetaminophen (NORCO/VICODIN) 5-325 MG per tablet 2 tablet (2 tablets Oral Given 08/21/21 1842)  ? ? ?ED Course/ Medical Decision Making/ A&P ?  ?                        ?Medical Decision Making ?Amount and/or Complexity of Data Reviewed ?External Data Reviewed: notes. ?Labs:   Decision-making details documented in ED Course. ?Radiology: ordered and independent interpretation performed. Decision-making details documented in ED Course. ?ECG/medicine tests: ordered and independent interpretation per

## 2021-08-21 NOTE — ED Notes (Signed)
Pt's son Davonne Baby) called for ride home due to discharge.  ?

## 2021-08-21 NOTE — Discharge Instructions (Addendum)
Several prescriptions have been called into your pharmacy, which include: ?Naproxen-take 1 tablet twice a day for the next 10 to 14 days for pain relief ?Robaxin-take 1 tablet at night before you go to bed as needed for additional pain relief.  Do not take this before driving or operating machinery ? ?MRI imaging of the lumbar region did not indicate any acute fracture.  Please continue your follow-up with orthopedics. ? ?The contact information for a local orthopedics office, Dr. Amedeo Kinsman, has been provided for you.  Please contact them to schedule appointment as soon as possible, preferably within the next 3-4 days for consultation and possible treatment. ? ?You may also utilize over-the-counter lidocaine patches for targeted local relief, and the percentage of 3% - 4%.  These may be applied directly to the area of concern.  Read product packaging for further information. ? ?You may also utilize over-the-counter Voltaren/diclofenac gel for additional targeted local relief.  This may be applied 2-3 times per day to the affected area. ? ?Follow-up with your primary care within the next week for holistic approach and overall management as discussed. ? ?Return to the ED for new or worsening symptoms as discussed. ?

## 2021-08-21 NOTE — ED Notes (Signed)
Patient transported to X-ray 

## 2021-08-21 NOTE — ED Notes (Signed)
ED Provider at bedside. 

## 2021-08-25 ENCOUNTER — Ambulatory Visit: Payer: BC Managed Care – PPO | Admitting: Orthopaedic Surgery

## 2021-08-25 ENCOUNTER — Encounter: Payer: Self-pay | Admitting: Orthopaedic Surgery

## 2021-08-25 ENCOUNTER — Encounter: Payer: Self-pay | Admitting: Nurse Practitioner

## 2021-08-25 VITALS — BP 132/60 | HR 83 | Ht 65.0 in | Wt 217.0 lb

## 2021-08-25 DIAGNOSIS — M5442 Lumbago with sciatica, left side: Secondary | ICD-10-CM | POA: Diagnosis not present

## 2021-08-25 DIAGNOSIS — M5441 Lumbago with sciatica, right side: Secondary | ICD-10-CM | POA: Diagnosis not present

## 2021-08-25 MED ORDER — HYDROCODONE-ACETAMINOPHEN 5-325 MG PO TABS: ORAL_TABLET | ORAL | 0 refills | Status: DC

## 2021-08-25 NOTE — Progress Notes (Signed)
? ?Subjective:  ? ? Patient ID: Theresa [name], female    DOB: 02/05/1971, 51 y.o.   MRN: [id number] ? ?HPI ?She bent over to pick up a paper clip at work on 08-21-21 and had acute pain of the lower back and was unable to move well.  She went to the ER. She was examined and had MRI of the lower back as her pain was so severe. ? ?I have reviewed the ER notes and the MRI. ? ?MRI report states: ?IMPRESSION: ?1. Mild degenerative changes, without spinal canal stenosis or ?neural foraminal narrowing. ?2. Narrowing of the left lateral recess at L5-S1, which could affect ?the descending left S1 nerve roots. ?  ?I have independently reviewed the MRI.   ? ?She was given pain medicine and told to come here. ? ?She is a little better.  She can sit for about 10 to 15 minutes at a time.  She has pain moving about.  She has no numbness and no bowel or bladder problems, no weakness.  She has pain but she says it is tolerable.  Pain is more on the left side to the foot. ? ?She has bilateral knee pain she has had for a while but I told her that will need to wait. ? ?She is on Naprosyn.  It helps.  I will give pain medicine also today. ? ?I will have her see neurosurgery.  She is agreeable. ? ?I told her it is unusual for the ER to get MRI when first seen and the results of the MRI have justified the getting the study early. ? ? ? ? ?Review of Systems  ?Constitutional:  Positive for activity change.  ?Musculoskeletal:  Positive for arthralgias, back pain and gait problem.  ?Psychiatric/Behavioral:  The patient is nervous/anxious.   ?All other systems reviewed and are negative. ?For Review of Systems, all other systems reviewed and are negative. ? ?The following is a summary of the past history medically, past history surgically, known current medicines, social history and family history.  This information is gathered electronically by the computer from prior information and documentation.  I review this each visit and have found  including this information at this point in the chart is beneficial and informative.  ? ?Past Medical History:  ?Diagnosis Date  ? Anxiety   ? Depression   ? Fibromyalgia   ? GERD (gastroesophageal reflux disease)   ? Headache(784.0)   ? Heart murmur   ? Hyperplastic colon polyp 10/28/2019  ? Dr Sydell Axon recommends repeat colonoscopy in 2028.  ? IFG (impaired fasting glucose)   ? Obsessive-compulsive disorder   ? Osteoarthritis   ? Seizures ([place])   ? last seizure at age 68-stressed induced seizure.  ? ? ?Past Surgical History:  ?Procedure Laterality Date  ? ABDOMINAL HYSTERECTOMY    ? still has right ovary  ? BIOPSY N/A 03/05/2014  ? Procedure: DUODENAL AND GASTRIC BIOPSY;  Surgeon: Danie Binder, MD;  Location: [facility];  Service: Endoscopy;  Laterality: N/A;  ? bladder sling X 3    ? CHILECTOMY Left 03/12/2020  ? Procedure: CHILECTOMY HALLUX;  Surgeon: Tyson Babinski, DPM;  Location: [facility];  Service: Podiatry;  Laterality: Left;  ? CHOLECYSTECTOMY    ? COLONOSCOPY  04/24/2012  ? FYB:OFBPZWC polyp measuring 5 mm in size was found in the proximal transverse colon; polypectomy was performed with cold forceps Pedunculated polyp measuring 1.1 cm in size was found in the sigmoid colon; polypectomy was performed using snare  cautery Moderate diverticulosis was noted in the descending colon and sigmoid colon small internal hemorrhoids. next tcs 04/2017  ? COLONOSCOPY WITH PROPOFOL N/A 10/23/2019  ? Procedure: COLONOSCOPY WITH PROPOFOL;  Surgeon: Daneil Dolin, MD;  Location: [facility];  Service: Endoscopy;  Laterality: N/A;  12:30pm  ? ESOPHAGOGASTRODUODENOSCOPY N/A 02/18/2014  ? Procedure: ESOPHAGOGASTRODUODENOSCOPY (EGD);  Surgeon: Danie Binder, MD;  Location: [facility];  Service: Endoscopy;  Laterality: N/A;  115  ? ESOPHAGOGASTRODUODENOSCOPY (EGD) WITH PROPOFOL N/A 03/05/2014  ? SLF: 1. dyspepsia due to constipation, gastritis, and GERD 2. Multiple Gastric polyps 3. Moderate non-erosive gastritis 4.  Diverticulum in the 2nd part of the duodenum  ? ESOPHAGOGASTRODUODENOSCOPY (EGD) WITH PROPOFOL N/A 10/23/2019  ? Procedure: ESOPHAGOGASTRODUODENOSCOPY (EGD) WITH PROPOFOL;  Surgeon: Daneil Dolin, MD;  Location: [facility];  Service: Endoscopy;  Laterality: N/A;  ? MALONEY DILATION N/A 10/23/2019  ? Procedure: MALONEY DILATION;  Surgeon: Daneil Dolin, MD;  Location: [facility];  Service: Endoscopy;  Laterality: N/A;  ? POLYPECTOMY  10/23/2019  ? Procedure: POLYPECTOMY;  Surgeon: Daneil Dolin, MD;  Location: [facility];  Service: Endoscopy;;  ? TUBAL LIGATION    ? ? ?Current Outpatient Medications on File Prior to Visit  ?Medication Sig Dispense Refill  ? CALCIUM-VITAMIN D-VITAMIN K PO Take 2 tablets by mouth daily.    ? dexlansoprazole (DEXILANT) 60 MG capsule TAKE 1 CAPSULE BY MOUTH ONCE DAILY 30 capsule 0  ? lisinopril-hydrochlorothiazide (ZESTORETIC) 20-25 MG tablet TAKE 1 TABLET BY MOUTH ONCE DAILY. 90 tablet 0  ? Magnesium 250 MG TABS 1 tablet with a meal    ? methocarbamol (ROBAXIN) 500 MG tablet Take 1 tablet (500 mg total) by mouth at bedtime for 7 days. 7 tablet 0  ? naproxen (NAPROSYN) 375 MG tablet Take 1 tablet (375 mg total) by mouth 2 (two) times daily. 20 tablet 0  ? ondansetron (ZOFRAN) 8 MG tablet TAKE  (1)  TABLET  THREE TIMES DAILY AS NEEDED FOR NAUSEA. 15 tablet 3  ? Plecanatide (TRULANCE) 3 MG TABS Take one tab po qd for constipation 30 tablet 2  ? Polyethylene Glycol 3350 (MIRALAX PO) Take by mouth.    ? Potassium (POTASSIMIN PO) Take by mouth.    ? Prenatal Vit-Fe Fumarate-FA (PRENATAL VITAMIN PO) Take by mouth.    ? sertraline (ZOLOFT) 50 MG tablet Take 1 tablet (50 mg total) by mouth daily. 30 tablet 2  ? TURMERIC PO Take by mouth daily. Tea with ginger    ? valACYclovir (VALTREX) 1000 MG tablet TAKE 2 TABLETS BY MOUTH NOW AND TAKE 2 MORE TABLETS 12 HOURS LATER. 4 tablet 6  ? [DISCONTINUED] fluticasone (FLONASE) 50 MCG/ACT nasal spray Place 2 sprays into both nostrils daily.  (Patient not taking: Reported on 10/11/2019) 16 g 0  ? ?No current facility-administered medications on file prior to visit.  ? ? ?Social History  ? ?Socioeconomic History  ? Marital status: Divorced  ?  Spouse name: Not on file  ? Number of children: 2  ? Years of education: Not on file  ? Highest education level: Not on file  ?Occupational History  ? Occupation: OFFICE M[employer]  ?  Employer: [employer]  ?  Comment: Radio producer  ?Tobacco Use  ? Smoking status: Never  ? Smokeless tobacco: Never  ?Vaping Use  ? Vaping Use: Never used  ?Substance and Sexual Activity  ? Alcohol use: Yes  ?  Comment: occas  ?  Drug use: No  ? Sexual activity: Yes  ?  Birth control/protection: Surgical  ?Other Topics Concern  ? Not on file  ?Social History Narrative  ? Not on file  ? ?Social Determinants of Health  ? ?Financial Resource Strain: Not on file  ?Food Insecurity: Not on file  ?Transportation Needs: Not on file  ?Physical Activity: Not on file  ?Stress: Not on file  ?Social Connections: Not on file  ?Intimate Partner Violence: Not on file  ? ? ?Family History  ?Problem Relation Age of Onset  ? Alcohol abuse Mother   ? Depression Mother   ? Anxiety disorder Mother   ? Paranoid behavior Mother   ? Hypertension Mother   ? Diabetes Mother   ? Breast cancer Mother   ? Alcohol abuse Father   ? Hypertension Father   ? Diabetes Father   ? Physical abuse Brother   ? Healthy Daughter   ? GER disease Son   ? Healthy Son   ? Colon cancer Neg Hx   ? ADD / ADHD Neg Hx   ? Bipolar disorder Neg Hx   ? Dementia Neg Hx   ? OCD Neg Hx   ? Drug abuse Neg Hx   ? Schizophrenia Neg Hx   ? Seizures Neg Hx   ? Sexual abuse Neg Hx   ? ? ?BP 132/60   Pulse 83   Ht '5\' 5"'$  (1.651 m)   Wt 217 lb (98.4 kg)   BMI 36.11 kg/m?  ? ?Body mass index is 36.11 kg/m?. ? ?   ?Objective:  ? Physical Exam ?Vitals and nursing note reviewed. Exam conducted with a chaperone present.  ?Constitutional:   ?   Appearance: She is well-developed.  ?HENT:  ?    Head: Normocephalic and atraumatic.  ?Eyes:  ?   Conjunctiva/sclera: Conjunctivae normal.  ?   Pupils: Pupils are equal, round, and reactive to light.  ?Cardiovascular:  ?   Rate and Rhythm: Normal rate and regular rhy

## 2021-09-02 DIAGNOSIS — Z6838 Body mass index (BMI) 38.0-38.9, adult: Secondary | ICD-10-CM | POA: Diagnosis not present

## 2021-09-02 DIAGNOSIS — E559 Vitamin D deficiency, unspecified: Secondary | ICD-10-CM | POA: Diagnosis not present

## 2021-09-02 DIAGNOSIS — D509 Iron deficiency anemia, unspecified: Secondary | ICD-10-CM | POA: Diagnosis not present

## 2021-09-02 DIAGNOSIS — R399 Unspecified symptoms and signs involving the genitourinary system: Secondary | ICD-10-CM | POA: Diagnosis not present

## 2021-09-02 DIAGNOSIS — R42 Dizziness and giddiness: Secondary | ICD-10-CM | POA: Diagnosis not present

## 2021-09-04 DIAGNOSIS — M5416 Radiculopathy, lumbar region: Secondary | ICD-10-CM | POA: Diagnosis not present

## 2021-09-04 DIAGNOSIS — M461 Sacroiliitis, not elsewhere classified: Secondary | ICD-10-CM | POA: Diagnosis not present

## 2021-09-04 DIAGNOSIS — Z6835 Body mass index (BMI) 35.0-35.9, adult: Secondary | ICD-10-CM | POA: Diagnosis not present

## 2021-09-15 ENCOUNTER — Other Ambulatory Visit: Payer: Self-pay | Admitting: Nurse Practitioner

## 2021-09-15 MED ORDER — SEMAGLUTIDE-WEIGHT MANAGEMENT 0.25 MG/0.5ML ~~LOC~~ SOAJ: 0.2500 mg | SUBCUTANEOUS | 0 refills | Status: DC

## 2021-10-01 DIAGNOSIS — Z6838 Body mass index (BMI) 38.0-38.9, adult: Secondary | ICD-10-CM | POA: Diagnosis not present

## 2021-10-01 DIAGNOSIS — K5792 Diverticulitis of intestine, part unspecified, without perforation or abscess without bleeding: Secondary | ICD-10-CM | POA: Diagnosis not present

## 2021-10-02 ENCOUNTER — Telehealth: Payer: Self-pay | Admitting: *Deleted

## 2021-10-02 NOTE — Telephone Encounter (Signed)
Patient says the Zoloft that she is on is making her ill.  She would like to change to a different medication. She said she was on Lexapro once before. Please advise. Thank you.

## 2021-10-03 ENCOUNTER — Other Ambulatory Visit: Payer: Self-pay | Admitting: Nurse Practitioner

## 2021-10-03 MED ORDER — ESCITALOPRAM OXALATE 10 MG PO TABS: 10.0000 mg | ORAL_TABLET | Freq: Every day | ORAL | 2 refills | Status: DC

## 2021-10-03 NOTE — Telephone Encounter (Signed)
Switched to [employer]

## 2021-10-05 ENCOUNTER — Other Ambulatory Visit: Payer: Self-pay | Admitting: Nurse Practitioner

## 2021-10-27 ENCOUNTER — Ambulatory Visit: Payer: BC Managed Care – PPO | Admitting: Internal Medicine

## 2021-10-27 ENCOUNTER — Encounter: Payer: Self-pay | Admitting: Internal Medicine

## 2021-10-27 VITALS — BP 136/88 | HR 72 | Temp 97.3°F | Ht 65.0 in | Wt 212.0 lb

## 2021-10-27 DIAGNOSIS — K5909 Other constipation: Secondary | ICD-10-CM | POA: Diagnosis not present

## 2021-10-27 DIAGNOSIS — K219 Gastro-esophageal reflux disease without esophagitis: Secondary | ICD-10-CM | POA: Diagnosis not present

## 2021-10-27 DIAGNOSIS — K5730 Diverticulosis of large intestine without perforation or abscess without bleeding: Secondary | ICD-10-CM

## 2021-11-20 ENCOUNTER — Encounter: Payer: Self-pay | Admitting: Nurse Practitioner

## 2021-11-24 ENCOUNTER — Telehealth: Payer: Self-pay

## 2021-11-24 ENCOUNTER — Other Ambulatory Visit: Payer: Self-pay

## 2021-11-24 MED ORDER — LINACLOTIDE 72 MCG PO CAPS
72.0000 ug | ORAL_CAPSULE | Freq: Every day | ORAL | 11 refills | Status: DC
Start: 2021-11-24 — End: 2022-07-09

## 2021-11-24 NOTE — Telephone Encounter (Signed)
Pt states that the Linzess 72 mcg is working. Pt also states that she has had some break though GERD but that it is tolerable. Pt would like to have an rx sent in for the linzess. Pt states that she is also having trouble with a lot of UTI's that are coming back with ecoli. Pt is wondering if it is possible that her bowels could be leaking in to her bladder.

## 2021-11-24 NOTE — Telephone Encounter (Signed)
Lmom for pt.   

## 2021-11-26 NOTE — Telephone Encounter (Signed)
Pt was made aware.  

## 2021-12-04 ENCOUNTER — Other Ambulatory Visit: Payer: Self-pay | Admitting: Family Medicine

## 2021-12-04 MED ORDER — DEXLANSOPRAZOLE 60 MG PO CPDR: DELAYED_RELEASE_CAPSULE | ORAL | 0 refills | Status: DC

## 2021-12-11 ENCOUNTER — Ambulatory Visit: Payer: BC Managed Care – PPO | Admitting: Nurse Practitioner

## 2021-12-11 VITALS — BP 138/88 | HR 83 | Ht 65.0 in | Wt 212.0 lb

## 2021-12-11 DIAGNOSIS — R3915 Urgency of urination: Secondary | ICD-10-CM | POA: Diagnosis not present

## 2021-12-11 DIAGNOSIS — N3281 Overactive bladder: Secondary | ICD-10-CM | POA: Diagnosis not present

## 2021-12-11 DIAGNOSIS — N393 Stress incontinence (female) (male): Secondary | ICD-10-CM | POA: Diagnosis not present

## 2021-12-11 DIAGNOSIS — N390 Urinary tract infection, site not specified: Secondary | ICD-10-CM | POA: Diagnosis not present

## 2021-12-11 DIAGNOSIS — E2839 Other primary ovarian failure: Secondary | ICD-10-CM

## 2021-12-11 LAB — POCT URINALYSIS DIPSTICK
Spec Grav, UA: 1.015 (ref 1.010–1.025)
pH, UA: 7 (ref 5.0–8.0)

## 2021-12-11 MED ORDER — ESTRADIOL 0.1 MG/GM VA CREA: TOPICAL_CREAM | VAGINAL | 5 refills | Status: DC

## 2021-12-11 MED ORDER — AMPHETAMINE-DEXTROAMPHET ER 20 MG PO CP24: 20.0000 mg | ORAL_CAPSULE | ORAL | 0 refills | Status: DC

## 2021-12-11 NOTE — Progress Notes (Unsigned)
Subjective:    Patient ID: [name], female    DOB: 1970-08-06, 51 y.o.   MRN: [id number]  HPI  Patient arrives to discuss restarting her ADD medication. Patient also stated she is having problems with frequent UTIs. Was off her Adderall for several months, continued to have difficulty focusing but her job has become much more stressful and busy lately for several reasons.  Would like to restart Adderall for now.  Denies any previous adverse effects including headache insomnia chest pain or palpitations. Also has questions regarding her visit with urogynecology on 05/26/2021.  Does not feel findings and plan were adequately communicated at that time.  Patient states she had UTIs off and on about every 2 weeks for 6 months, none recently.  Was seen at local urgent care near where she works.  According to patient urine cultures were done which showed E. coli. Her diagnoses from the urogynecologist include recurrent UTI overactive bladder urinary frequency and SUI.   Review of Systems  Respiratory:  Negative for chest tightness and shortness of breath.   Cardiovascular:  Negative for chest pain and palpitations.  Genitourinary:  Positive for enuresis, frequency and urgency.  Denies any UTI symptoms today.     Objective:   Physical Exam NAD.  Alert, oriented.  Lungs clear.  Heart regular rate rhythm.  Abdomen soft nondistended nontender. Results for orders placed or performed in visit on 12/11/21  POCT urinalysis dipstick  Result Value Ref Range   Color, UA     Clarity, UA     Glucose, UA     Bilirubin, UA     Ketones, UA     Spec Grav, UA 1.015 1.010 - 1.025   Blood, UA     pH, UA 7.0 5.0 - 8.0   Protein, UA     Urobilinogen, UA     Nitrite, UA     Leukocytes, UA     Appearance     Odor     See detailed note from specialist 05/26/2021.       Assessment & Plan:   Problem List Items Addressed This Visit       Genitourinary   Overactive bladder   Recurrent UTI -  Primary   Relevant Orders   POCT urinalysis dipstick (Completed)     Other   Hypoestrogenism   SUI (stress urinary incontinence, female)   Urinary urgency   Relevant Orders   POCT urinalysis dipstick (Completed)   Meds ordered this encounter  Medications   estradiol (ESTRACE VAGINAL) 0.1 MG/GM vaginal cream    Sig: Apply one applicator intravaginally at bedtime x 2 weeks then once nightly twice a week    Dispense:  42.5 g    Refill:  5    Order Specific Question:   Supervising Provider    Answer:   Sallee Lange A [[id number]]   amphetamine-dextroamphetamine (ADDERALL XR) 20 MG 24 hr capsule    Sig: Take 1 capsule (20 mg total) by mouth every morning.    Dispense:  30 capsule    Refill:  0    Order Specific Question:   Supervising Provider    Answer:   Sallee Lange A [[id number]]   Restart Adderall XR 20 mg daily.  Discontinue medication if any adverse effects. Lengthy review of assessment and plan from the specialist with the patient.  Patient is not comfortable taking low-dose antibiotic daily, also we would not be prescribing this in primary care.  Offered referral back to  urogynecology.  At this time prefers this to be handled through our office.  Agrees to start topical estrogen therapy as recommended.  She will do this for several weeks and then consider adding on Myrbetriq for her OAB.  If patient continues to experience significant issues, recommend referral back to her specialist.  In the meantime we will see how these recommendations work. Return in about 3 months (around 03/13/2022).

## 2021-12-12 ENCOUNTER — Encounter: Payer: Self-pay | Admitting: Nurse Practitioner

## 2022-01-06 ENCOUNTER — Ambulatory Visit: Payer: BC Managed Care – PPO | Admitting: Nurse Practitioner

## 2022-01-08 ENCOUNTER — Encounter: Payer: Self-pay | Admitting: Nurse Practitioner

## 2022-01-11 NOTE — Telephone Encounter (Signed)
Patient scheduled follow up office visit this morning with Dr Lacinda Axon

## 2022-01-13 ENCOUNTER — Encounter: Payer: Self-pay | Admitting: Nurse Practitioner

## 2022-01-14 NOTE — Telephone Encounter (Signed)
Certainly sorry she is having such difficult time when we see her on Friday we can assess and figure out if we need to run any tests or provide any type of other interventions thanks-Dr. Nicki Reaper

## 2022-01-15 ENCOUNTER — Encounter: Payer: Self-pay | Admitting: Nurse Practitioner

## 2022-01-18 ENCOUNTER — Encounter: Payer: Self-pay | Admitting: *Deleted

## 2022-01-22 ENCOUNTER — Encounter: Payer: Self-pay | Admitting: Nurse Practitioner

## 2022-01-26 ENCOUNTER — Ambulatory Visit (INDEPENDENT_AMBULATORY_CARE_PROVIDER_SITE_OTHER): Payer: BC Managed Care – PPO

## 2022-01-26 ENCOUNTER — Encounter: Payer: Self-pay | Admitting: Orthopaedic Surgery

## 2022-01-26 ENCOUNTER — Ambulatory Visit: Payer: BC Managed Care – PPO | Admitting: Orthopaedic Surgery

## 2022-01-26 VITALS — BP 141/95 | HR 73 | Ht 65.0 in | Wt 223.0 lb

## 2022-01-26 DIAGNOSIS — M25561 Pain in right knee: Secondary | ICD-10-CM

## 2022-01-26 DIAGNOSIS — M25571 Pain in right ankle and joints of right foot: Secondary | ICD-10-CM

## 2022-01-26 DIAGNOSIS — G8929 Other chronic pain: Secondary | ICD-10-CM | POA: Diagnosis not present

## 2022-01-26 MED ORDER — NAPROXEN 500 MG PO TABS: 500.0000 mg | ORAL_TABLET | Freq: Two times a day (BID) | ORAL | 5 refills | Status: DC

## 2022-01-26 MED ORDER — METHYLPREDNISOLONE ACETATE 40 MG/ML IJ SUSP
40.0000 mg | Freq: Once | INTRAMUSCULAR | Status: AC
  Administered 2022-01-26: 40 mg via INTRA_ARTICULAR

## 2022-01-26 NOTE — Patient Instructions (Addendum)
While we are working on your approval please go ahead and call to schedule your appointment with [facility] in at least 2 weeks.    Central Scheduling [phone]  **LET THEM KNOW YOU WANT TO SCHEDULE YOUR IMAGING APPOINTMENT FOR [facility] IF YOU CAN GET A SOONER APPT THERE**  AFTER you have made your imaging appointment, please call our office back at [phone] to schedule an appointment to review your results. Your follow up appointment will need to be 3-4 days after your imaging is performed to allow [place] time to get the report. If you are having your imaging performed in a facility not associated with [facility], you will need to ask for a CD with your images on them.   Address: [address] [place], [place] [address] Phone: [phone]  Call and schedule your appointment. We will work on the authorization.

## 2022-01-26 NOTE — Progress Notes (Signed)
My knee hurts and my ankle hurts.  She has been having pain in the right knee with popping, swelling and giving way.  It has gotten worse over the last few weeks.  It has been a problem for a while but much worse recently.  She has no known injury.  She has no redness, no numbness.  She has right anterior ankle pain that can occur even when standing.  She has had problems with the right ankle for years.  She cannot wear a tennis shoe.  With the weather getting colder, she is concerned she will not be able to wear conventional shoes.  Right knee has pain, effusion, medial side pain, crepitus, ROM 0 to 110, limp to right, positive medial McMurray.  Right ankle has full ROM with only slight tenderness over the anterior talofibular ligament.  No swelling is present.  NV intact.  Encounter Diagnoses  Name Primary?   Chronic pain of right knee Yes   Pain in right ankle and joints of right foot    X-rays were done of the right knee and right ankle, reported separately.  I am concerned about a medical meniscus tear as she is having pain and swelling and giving way and not getting better.  I will begin Naprosyn 500 po bid pc.  PROCEDURE NOTE:  The patient requests injections of the right knee , verbal consent was obtained.  The right knee was prepped appropriately after time out was performed.   Sterile technique was observed and injection of 1 cc of DepoMedrol '40mg'$  with several cc's of plain xylocaine. Anesthesia was provided by ethyl chloride and a 20-gauge needle was used to inject the knee area. The injection was tolerated well.  A band aid dressing was applied.  The patient was advised to apply ice later today and tomorrow to the injection sight as needed.  Return in one week.  I would like to get a MRI on the right knee as she may need arthroscopy.  I hope insurance will approve this at this point in time.  Call if any problem.  Precautions discussed.  Electronically Signed Sanjuana Kava, MD 9/26/20233:35 PM  Addendum: She said she was in more pain in the knee.  I will call in pain medicine.  I have reviewed the [facility] web site prior to prescribing narcotic medicine for this patient.  Electronically Signed Sanjuana Kava, MD 9/27/20236:07 PM

## 2022-01-27 ENCOUNTER — Encounter: Payer: Self-pay | Admitting: Orthopaedic Surgery

## 2022-01-27 MED ORDER — HYDROCODONE-ACETAMINOPHEN 5-325 MG PO TABS: ORAL_TABLET | ORAL | 0 refills | Status: DC

## 2022-01-27 NOTE — Telephone Encounter (Signed)
Patient called back; left a voice message, to relay she received Abby's message; mentioned still same problems; left pharmacy information, [facility]. Please call or message back.

## 2022-01-27 NOTE — Addendum Note (Signed)
Addended by: Willette Pa on: 01/27/2022 06:07 PM   Modules accepted: Orders

## 2022-01-27 NOTE — Telephone Encounter (Signed)
Called patient to discuss a prescription for pain medication. No answer. Left message asking for a call back or she can send a mychart message if that is easier. Provider aware call was placed and waiting on reply.

## 2022-01-29 ENCOUNTER — Ambulatory Visit (HOSPITAL_BASED_OUTPATIENT_CLINIC_OR_DEPARTMENT_OTHER)
Admission: RE | Admit: 2022-01-29 | Discharge: 2022-01-29 | Disposition: A | Discharge status: Home / Self Care | Payer: BC Managed Care – PPO | Source: Ambulatory Visit | Attending: Orthopaedic Surgery | Admitting: Orthopaedic Surgery

## 2022-01-29 ENCOUNTER — Encounter: Payer: Self-pay | Admitting: Orthopaedic Surgery

## 2022-01-29 DIAGNOSIS — M1711 Unilateral primary osteoarthritis, right knee: Secondary | ICD-10-CM | POA: Insufficient documentation

## 2022-01-29 DIAGNOSIS — X58XXXA Exposure to other specified factors, initial encounter: Secondary | ICD-10-CM | POA: Diagnosis not present

## 2022-01-29 DIAGNOSIS — M25561 Pain in right knee: Secondary | ICD-10-CM | POA: Diagnosis not present

## 2022-01-29 DIAGNOSIS — M25461 Effusion, right knee: Secondary | ICD-10-CM | POA: Insufficient documentation

## 2022-01-29 DIAGNOSIS — G8929 Other chronic pain: Secondary | ICD-10-CM

## 2022-01-29 DIAGNOSIS — S83241A Other tear of medial meniscus, current injury, right knee, initial encounter: Secondary | ICD-10-CM | POA: Insufficient documentation

## 2022-02-02 ENCOUNTER — Encounter: Payer: Self-pay | Admitting: Orthopaedic Surgery

## 2022-02-02 ENCOUNTER — Ambulatory Visit: Payer: BC Managed Care – PPO | Admitting: Orthopaedic Surgery

## 2022-02-02 DIAGNOSIS — G8929 Other chronic pain: Secondary | ICD-10-CM

## 2022-02-02 DIAGNOSIS — M25561 Pain in right knee: Secondary | ICD-10-CM

## 2022-02-02 MED ORDER — HYDROCODONE-ACETAMINOPHEN 5-325 MG PO TABS
ORAL_TABLET | ORAL | 0 refills | Status: DC
Start: 2022-02-02 — End: 2022-04-12

## 2022-02-02 NOTE — Progress Notes (Signed)
My knee is still hurting  She had MRI of the right knee.  It showed: IMPRESSION: 1. Early root tear of the posterior horn of the medial meniscus with incomplete meniscal detachment. 2. The lateral meniscus, cruciate and collateral ligaments are intact. 3. Moderate patellofemoral and mild medial compartment degenerative changes. No acute osseous findings. 4. Small to moderate knee joint effusion.  I have explained the findings to her and showed her a model.  I have recommended she see Dr. Aline Brochure for consideration of arthroscopy.  Right knee has medial pain, slight effusion, crepitus and ROM 0 to 110, slight limp right, positive medial McMurray.  NV intact.  No distal edema.  Encounter Diagnosis  Name Primary?   Chronic pain of right knee Yes   To see Dr. Lemmie Evens.  Call if any problem.  Precautions discussed.  The pharmacy did not have her medicine.  I will recall it in.  Electronically Signed Sanjuana Kava, MD 10/3/20233:08 PM

## 2022-02-02 NOTE — Patient Instructions (Signed)
We will get you scheduled to see Dr.Harrison to discuss surgery for your torn meniscus.

## 2022-02-09 ENCOUNTER — Encounter: Payer: Self-pay | Admitting: Nurse Practitioner

## 2022-02-09 NOTE — Telephone Encounter (Signed)
Telephone note created in patient chart and sent to provider

## 2022-03-17 ENCOUNTER — Ambulatory Visit: Payer: BC Managed Care – PPO | Admitting: Orthopedic Surgery

## 2022-03-17 DIAGNOSIS — S83241A Other tear of medial meniscus, current injury, right knee, initial encounter: Secondary | ICD-10-CM | POA: Diagnosis not present

## 2022-03-17 DIAGNOSIS — M2242 Chondromalacia patellae, left knee: Secondary | ICD-10-CM | POA: Diagnosis not present

## 2022-03-17 DIAGNOSIS — M2241 Chondromalacia patellae, right knee: Secondary | ICD-10-CM | POA: Diagnosis not present

## 2022-03-18 ENCOUNTER — Encounter: Payer: Self-pay | Admitting: Orthopedic Surgery

## 2022-03-18 NOTE — Progress Notes (Signed)
Chief Complaint  Patient presents with   Knee Pain    Right- using herbal remedy that has helped with the pain but is giving way   The patient is 51 years old she presents for possible surgical intervention regarding her right knee.  The patient has been seen by Dr. Luna Glasgow and she was diagnosed with a root tear medial meniscus right knee  The patient says she has had intermittent pain in her knee for several years but she turned over in bed 1 day after she had been moving into a new place of residence and felt a pop in the knee and since that time the knee has worsened  Apparently Dr. Luna Glasgow saw her for the knee and order the MRI.  She complains of pain actually anteriorly and hyperextending feeling of the knee when it gives way.  She actually complains of hyperextension of the left knee as well just not as bad  She says she is very good at taking care of her medical issues with her native methods of treatment and has done well over the years doing that  Review of systems there is a history of some back discomfort as well  No chest pain or shortness of breath  General appearance the patient has no gross abnormalities or congenital deformities  She is oriented to person place and time  Her mood is pleasant her affect is normal  She walks without any support  Both knees feel stable on ligament testing.  She has full range of motion.  She has tenderness and crepitance in both knees especially in the patellofemoral region  She does have some posteromedial knee pain which she said she was unaware that was hurting until I palpated the posterior medial corner of the right knee  Meniscal signs were negative.  Muscle tone and strength were normal skin was intact  She had no peripheral edema.  Sensory exam was without abnormality  Coordination was normal as well  Imaging shows  Clinical:  right knee pain, swelling, giving way   X-rays were done of the right knee, three views.    There is slight effusion present of the right knee.  Alignment is good. No fracture or loose body noted.  Bone quality is good.   Impression: slight effusion of the right knee, no other acute findings.   Electronically Signed Sanjuana Kava, MD 9/26/20233:18 PM  MRI report   IMPRESSION: 1. Early root tear of the posterior horn of the medial meniscus with incomplete meniscal detachment. 2. The lateral meniscus, cruciate and collateral ligaments are intact. 3. Moderate patellofemoral and mild medial compartment degenerative changes. No acute osseous findings. 4. Small to moderate knee joint effusion.     Electronically Signed   By: Richardean Sale M.D.   On: 02/01/2022 16:34   The MRI does show a root tear posteromedial medial meniscus lateral meniscus and cruciates intact she does have some patellofemoral and medial compartment degenerative changes  I discussed this with her I think some strengthening exercises are fine.  We discussed that the meniscal tear may or may not progress if it does it is amenable to possible repair depending on arthroscopic evaluation  She is agreeable and will follow-up in 4 to 6 weeks to see if the knee exercises help her hyperextension and patellofemoral pain

## 2022-04-08 ENCOUNTER — Other Ambulatory Visit: Payer: Self-pay | Admitting: Nurse Practitioner

## 2022-04-08 DIAGNOSIS — Z1231 Encounter for screening mammogram for malignant neoplasm of breast: Secondary | ICD-10-CM

## 2022-04-12 ENCOUNTER — Other Ambulatory Visit: Payer: Self-pay | Admitting: Adult Health

## 2022-04-12 ENCOUNTER — Encounter: Payer: Self-pay | Admitting: Adult Health

## 2022-04-12 ENCOUNTER — Other Ambulatory Visit (HOSPITAL_COMMUNITY)
Admission: RE | Admit: 2022-04-12 | Discharge: 2022-04-12 | Disposition: A | Discharge status: Home / Self Care | Payer: BC Managed Care – PPO | Source: Ambulatory Visit | Attending: Adult Health | Admitting: Adult Health

## 2022-04-12 ENCOUNTER — Ambulatory Visit (INDEPENDENT_AMBULATORY_CARE_PROVIDER_SITE_OTHER): Payer: BC Managed Care – PPO | Admitting: Adult Health

## 2022-04-12 VITALS — BP 153/97 | HR 69 | Ht 65.0 in | Wt 229.0 lb

## 2022-04-12 DIAGNOSIS — Z1329 Encounter for screening for other suspected endocrine disorder: Secondary | ICD-10-CM

## 2022-04-12 DIAGNOSIS — Z90710 Acquired absence of both cervix and uterus: Secondary | ICD-10-CM

## 2022-04-12 DIAGNOSIS — Z1211 Encounter for screening for malignant neoplasm of colon: Secondary | ICD-10-CM | POA: Insufficient documentation

## 2022-04-12 DIAGNOSIS — Z131 Encounter for screening for diabetes mellitus: Secondary | ICD-10-CM | POA: Diagnosis not present

## 2022-04-12 DIAGNOSIS — F32A Depression, unspecified: Secondary | ICD-10-CM

## 2022-04-12 DIAGNOSIS — Z113 Encounter for screening for infections with a predominantly sexual mode of transmission: Secondary | ICD-10-CM

## 2022-04-12 DIAGNOSIS — Z01419 Encounter for gynecological examination (general) (routine) without abnormal findings: Secondary | ICD-10-CM | POA: Diagnosis not present

## 2022-04-12 DIAGNOSIS — Z13220 Encounter for screening for lipoid disorders: Secondary | ICD-10-CM

## 2022-04-12 DIAGNOSIS — I10 Essential (primary) hypertension: Secondary | ICD-10-CM

## 2022-04-12 DIAGNOSIS — N941 Unspecified dyspareunia: Secondary | ICD-10-CM

## 2022-04-12 DIAGNOSIS — F419 Anxiety disorder, unspecified: Secondary | ICD-10-CM

## 2022-04-12 HISTORY — DX: Encounter for screening for diabetes mellitus: Z13.1

## 2022-04-12 HISTORY — DX: Encounter for screening for malignant neoplasm of colon: Z12.11

## 2022-04-12 HISTORY — DX: Encounter for screening for lipoid disorders: Z13.220

## 2022-04-12 HISTORY — DX: Acquired absence of both cervix and uterus: Z90.710

## 2022-04-12 HISTORY — DX: Encounter for screening for infections with a predominantly sexual mode of transmission: Z11.3

## 2022-04-12 HISTORY — DX: Encounter for screening for other suspected endocrine disorder: Z13.29

## 2022-04-12 HISTORY — DX: Encounter for gynecological examination (general) (routine) without abnormal findings: Z01.419

## 2022-04-12 LAB — HEMOCCULT GUIAC POC 1CARD (OFFICE): Fecal Occult Blood, POC: NEGATIVE

## 2022-04-12 MED ORDER — ESCITALOPRAM OXALATE 10 MG PO TABS: 10.0000 mg | ORAL_TABLET | Freq: Every day | ORAL | 3 refills | Status: DC

## 2022-04-12 MED ORDER — LISINOPRIL-HYDROCHLOROTHIAZIDE 20-25 MG PO TABS: 1.0000 | ORAL_TABLET | Freq: Every day | ORAL | 3 refills | Status: DC

## 2022-04-12 MED ORDER — BUPROPION HCL ER (XL) 150 MG PO TB24
150.0000 mg | ORAL_TABLET | Freq: Every day | ORAL | 3 refills | Status: DC
Start: 2022-04-12 — End: 2022-04-12

## 2022-04-12 MED ORDER — PREMARIN 0.625 MG/GM VA CREA
TOPICAL_CREAM | VAGINAL | 3 refills | Status: DC
Start: 2022-04-12 — End: 2022-06-15

## 2022-04-12 NOTE — Progress Notes (Signed)
Will rx lexapro and D/C Wellbutrin

## 2022-04-12 NOTE — Progress Notes (Signed)
Patient ID: Theresa [name], female   DOB: 1970-08-26, 51 y.o.   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 She stopped BP meds about 3 months ago. Has stress at work.  PCP is Dr Sallee Lange   Current Medications, Allergies, Past Medical History, Past Surgical History, Family History and Social History were reviewed in [facility] record.     Review of Systems: Patient denies any headaches, hearing loss, fatigue, blurred vision, shortness of breath, chest pain, abdominal pain, problems with bowel movements, urination, or intercourse(last sex in April, had some pain). No joint pain or mood swings.     Physical Exam:BP (!) 153/97 (BP Location: Right Arm, Patient Position: Sitting, Cuff Size: Normal)   Pulse 69   Ht '5\' 5"'$  (1.651 m)   Wt 229 lb (103.9 kg)   BMI 38.11 kg/m   General:  Well developed, well nourished, no acute distress Skin:  Warm and dry Neck:  Midline trachea, normal thyroid, good ROM, no lymphadenopathy Lungs; Clear to auscultation bilaterally Breast:  No dominant palpable mass, retraction, or nipple discharge.right nipple, chronically inverted. Cardiovascular: Regular rate and rhythm Abdomen:  Soft, non tender, no hepatosplenomegaly Pelvic:  External genitalia is normal in appearance, no lesions.  The vagina is pale. Urethra has no lesions or masses. The cervix and uterus is absent, CV swab obtained.No adnexal masses or tenderness noted.Bladder is non tender, no masses felt. Rectal: Good sphincter tone, no polyps, or hemorrhoids felt.  Hemoccult negative. Extremities/musculoskeletal:  No swelling or varicosities noted, no clubbing or cyanosis Psych:  No mood changes, alert and cooperative,seems happy AA is 1 Fall risk is low    04/12/2022    8:51 AM 06/26/2021   11:39 AM 11/19/2020    9:06 AM  Depression screen PHQ 2/9  Decreased Interest 2 2 0   Down, Depressed, Hopeless 2 2 0  PHQ - 2 Score 4 4 0  Altered sleeping 3 3 0  Tired, decreased energy 2 3 0  Change in appetite 2 0 0  Feeling bad or failure about yourself  0 0 0  Trouble concentrating 3 3 0  Moving slowly or fidgety/restless 0 2 0  Suicidal thoughts 0 0 0  PHQ-9 Score 14 15 0  Difficult doing work/chores  Somewhat difficult Not difficult at all       04/12/2022    8:52 AM 06/26/2021   11:39 AM 04/07/2021    4:59 PM 04/04/2020    2:27 PM  GAD 7 : Generalized Anxiety Score  Nervous, Anxious, on Edge '3 2 1 2  '$ Control/stop worrying '3 3 3 3  '$ Worry too much - different things '3 3 3 3  '$ Trouble relaxing '3 2 2 3  '$ Restless 2 1 0 1  Easily annoyed or irritable '3 2 1 2  '$ Afraid - awful might happen '1 2 1 2  '$ Total GAD 7 Score '18 15 11 16  '$ Anxiety Difficulty  Very difficult Somewhat difficult Very difficult      Upstream - 04/12/22 0845       Pregnancy Intention Screening   Does the patient want to become pregnant in the next year? N/A    Does the patient's partner want to become pregnant in the next year? N/A    Would the patient like to discuss contraceptive options today? N/A      Contraception Wrap Up   Current Method  Female Sterilization   hyst   End Method Female Sterilization   hyst   Contraception Counseling Provided No             Examination chaperoned by Dewitt Hoes RN  Impression and Plan: 1. Encounter for well woman exam with routine gynecological exam Physical in 1 year Will check labs get mammogram at [facility] per GI  - Comprehensive metabolic panel - Lipid panel - CBC  2. Essential hypertension, benign Start back on Zestoretic  Meds ordered this encounter  Medications   buPROPion (WELLBUTRIN XL) 150 MG 24 hr tablet    Sig: Take 1 tablet (150 mg total) by mouth daily.    Dispense:  30 tablet    Refill:  3    Order Specific Question:   Supervising Provider    Answer:   Tania Ade H [[id number]]    lisinopril-hydrochlorothiazide (ZESTORETIC) 20-25 MG tablet    Sig: Take 1 tablet by mouth daily.    Dispense:  30 tablet    Refill:  3    Order Specific Question:   Supervising Provider    Answer:   Tania Ade H [[id number]]   conjugated estrogens (PREMARIN) vaginal cream    Sig: Use 0.5 gm in vagina at bedtime for 2 weeks then 2-3 x weekly    Dispense:  42.5 g    Refill:  3    Order Specific Question:   Supervising Provider    Answer:   Tania Ade H [[id number]]    - Comprehensive metabolic panel  Follow up in 8 weeks for BP check and ROS on restarting Wellbutrin and using PVC  3. Encounter for screening fecal occult blood testing Hemoccult was negative  - POCT occult blood stool  4. Screening examination for STD (sexually transmitted disease) CV swab sent for GC/CHL,trich, BV and yeast  - Cervicovaginal ancillary only( [facility])  5. Screening for diabetes mellitus - Hemoglobin A1c  6. Screening cholesterol level - Lipid panel  7. Screening for thyroid disorder  - TSH  8. Anxiety and depression Start back on Wellbutrin  9. Dyspareunia in female Will try premarin vaginal cream   10. S/P hysterectomy

## 2022-04-13 LAB — COMPREHENSIVE METABOLIC PANEL
ALT: 20 IU/L (ref 0–32)
AST: 15 IU/L (ref 0–40)
Albumin/Globulin Ratio: 1.8 (ref 1.2–2.2)
Albumin: 4.4 g/dL (ref 3.9–4.9)
Alkaline Phosphatase: 91 IU/L (ref 44–121)
BUN/Creatinine Ratio: 15 (ref 9–23)
BUN: 11 mg/dL (ref 6–24)
Bilirubin Total: 0.6 mg/dL (ref 0.0–1.2)
CO2: 26 mmol/L (ref 20–29)
Calcium: 9.3 mg/dL (ref 8.7–10.2)
Chloride: 106 mmol/L (ref 96–106)
Creatinine, Ser: 0.75 mg/dL (ref 0.57–1.00)
Globulin, Total: 2.4 g/dL (ref 1.5–4.5)
Glucose: 99 mg/dL (ref 70–99)
Potassium: 4.3 mmol/L (ref 3.5–5.2)
Sodium: 144 mmol/L (ref 134–144)
Total Protein: 6.8 g/dL (ref 6.0–8.5)
eGFR: 97 mL/min/{1.73_m2} (ref >59)

## 2022-04-13 LAB — CERVICOVAGINAL ANCILLARY ONLY
Bacterial Vaginitis (gardnerella): NEGATIVE
Candida Glabrata: NEGATIVE
Candida Vaginitis: NEGATIVE
Chlamydia: NEGATIVE
Comment: NEGATIVE
Comment: NEGATIVE
Comment: NEGATIVE
Comment: NEGATIVE
Comment: NEGATIVE
Comment: NORMAL
Neisseria Gonorrhea: NEGATIVE
Trichomonas: NEGATIVE

## 2022-04-13 LAB — CBC
Hematocrit: 40.4 % (ref 34.0–46.6)
Hemoglobin: 13.2 g/dL (ref 11.1–15.9)
MCH: 29.3 pg (ref 26.6–33.0)
MCHC: 32.7 g/dL (ref 31.5–35.7)
MCV: 90 fL (ref 79–97)
Platelets: 265 10*3/uL (ref 150–450)
RBC: 4.5 x10E6/uL (ref 3.77–5.28)
RDW: 12.6 % (ref 11.7–15.4)
WBC: 4.8 10*3/uL (ref 3.4–10.8)

## 2022-04-13 LAB — LIPID PANEL
Chol/HDL Ratio: 3.1 ratio (ref 0.0–4.4)
Cholesterol, Total: 183 mg/dL (ref 100–199)
HDL: 59 mg/dL (ref >39)
LDL Chol Calc (NIH): 100 mg/dL — ABNORMAL HIGH (ref 0–99)
Triglycerides: 136 mg/dL (ref 0–149)
VLDL Cholesterol Cal: 24 mg/dL (ref 5–40)

## 2022-04-13 LAB — TSH: TSH: 1.37 u[IU]/mL (ref 0.450–4.500)

## 2022-04-13 LAB — HEMOGLOBIN A1C
Est. average glucose Bld gHb Est-mCnc: 126 mg/dL
Hgb A1c MFr Bld: 6 % — ABNORMAL HIGH (ref 4.8–5.6)

## 2022-04-14 ENCOUNTER — Ambulatory Visit: Payer: BC Managed Care – PPO | Admitting: Orthopedic Surgery

## 2022-04-22 ENCOUNTER — Other Ambulatory Visit: Payer: Self-pay | Admitting: Adult Health

## 2022-04-22 DIAGNOSIS — Z1231 Encounter for screening mammogram for malignant neoplasm of breast: Secondary | ICD-10-CM

## 2022-04-22 NOTE — Progress Notes (Signed)
Will place order for mammogram

## 2022-04-23 ENCOUNTER — Other Ambulatory Visit: Payer: Self-pay | Admitting: *Deleted

## 2022-04-23 DIAGNOSIS — Z09 Encounter for follow-up examination after completed treatment for conditions other than malignant neoplasm: Secondary | ICD-10-CM

## 2022-04-23 DIAGNOSIS — Z803 Family history of malignant neoplasm of breast: Secondary | ICD-10-CM

## 2022-04-23 DIAGNOSIS — Z8742 Personal history of other diseases of the female genital tract: Secondary | ICD-10-CM

## 2022-04-30 ENCOUNTER — Ambulatory Visit: Payer: BC Managed Care – PPO | Admitting: Nurse Practitioner

## 2022-05-27 ENCOUNTER — Other Ambulatory Visit: Payer: Self-pay | Admitting: Nurse Practitioner

## 2022-05-28 ENCOUNTER — Other Ambulatory Visit: Payer: Self-pay | Admitting: Nurse Practitioner

## 2022-05-31 ENCOUNTER — Other Ambulatory Visit: Payer: Self-pay | Admitting: Internal Medicine

## 2022-05-31 ENCOUNTER — Other Ambulatory Visit: Payer: Self-pay

## 2022-05-31 MED ORDER — DEXLANSOPRAZOLE 60 MG PO CPDR: DELAYED_RELEASE_CAPSULE | ORAL | 3 refills | Status: DC

## 2022-06-07 ENCOUNTER — Ambulatory Visit: Payer: BC Managed Care – PPO | Admitting: Adult Health

## 2022-06-07 ENCOUNTER — Encounter: Payer: Self-pay | Admitting: Adult Health

## 2022-06-07 VITALS — BP 112/78 | HR 75 | Ht 65.0 in | Wt 222.5 lb

## 2022-06-07 DIAGNOSIS — R42 Dizziness and giddiness: Secondary | ICD-10-CM

## 2022-06-07 DIAGNOSIS — F32A Depression, unspecified: Secondary | ICD-10-CM

## 2022-06-07 DIAGNOSIS — F419 Anxiety disorder, unspecified: Secondary | ICD-10-CM

## 2022-06-07 DIAGNOSIS — R233 Spontaneous ecchymoses: Secondary | ICD-10-CM | POA: Diagnosis not present

## 2022-06-07 DIAGNOSIS — I10 Essential (primary) hypertension: Secondary | ICD-10-CM

## 2022-06-07 DIAGNOSIS — D699 Hemorrhagic condition, unspecified: Secondary | ICD-10-CM

## 2022-06-07 HISTORY — DX: Dizziness and giddiness: R42

## 2022-06-07 HISTORY — DX: Spontaneous ecchymoses: R23.3

## 2022-06-07 MED ORDER — ESCITALOPRAM OXALATE 20 MG PO TABS: 20.0000 mg | ORAL_TABLET | Freq: Every day | ORAL | 1 refills | Status: DC

## 2022-06-07 NOTE — Progress Notes (Signed)
Subjective:     Patient ID: Theresa [name], female   DOB: Sep 28, 1970, 52 y.o.   MRN: [id number]  HPI Theresa [name] is a 52 year old white female,divorced, sp hysterectomy back ion follow up on taking lexapro and Zestoretic. She does not take BP meds every day. Had tick she pulled off yesterday, has had bruises and bleeds easily, feels off balance. Had normal CBC in December. She says she is taking garlic,honey and cayenne pepper.  PCP is Dr Sallee Lange   Review of Systems Had tick bite yesterday She says she bruises easily Bleeds easily Feels dizzy at times and off balance Has some nausea She has had vertigo in past Has had headache Reviewed past medical,surgical, social and family history. Reviewed medications and allergies.     Objective:   Physical Exam BP 112/78 (BP Location: Left Arm, Patient Position: Sitting, Cuff Size: Normal)   Pulse 75   Ht '5\' 5"'$  (1.651 m)   Wt 222 lb 8 oz (100.9 kg)   BMI 37.03 kg/m  Skin warm and dry.  Lungs: clear to ausculation bilaterally. Cardiovascular: regular rate and rhythm.    Fall risk is low    06/07/2022    9:13 AM 04/12/2022    8:51 AM 06/26/2021   11:39 AM  Depression screen PHQ 2/9  Decreased Interest '1 2 2  '$ Down, Depressed, Hopeless 0 2 2  PHQ - 2 Score '1 4 4  '$ Altered sleeping '3 3 3  '$ Tired, decreased energy '2 2 3  '$ Change in appetite 0 2 0  Feeling bad or failure about yourself  0 0 0  Trouble concentrating '3 3 3  '$ Moving slowly or fidgety/restless 0 0 2  Suicidal thoughts 0 0 0  PHQ-9 Score '9 14 15  '$ Difficult doing work/chores   Somewhat difficult       06/07/2022    9:14 AM 04/12/2022    8:52 AM 06/26/2021   11:39 AM 04/07/2021    4:59 PM  GAD 7 : Generalized Anxiety Score  Nervous, Anxious, on Edge '2 3 2 1  '$ Control/stop worrying '3 3 3 3  '$ Worry too much - different things '3 3 3 3  '$ Trouble relaxing '2 3 2 2  '$ Restless '1 2 1 '$ 0  Easily annoyed or irritable '2 3 2 1  '$ Afraid - awful might happen '2 1 2 1  '$ Total GAD 7 Score '15 18  15 11  '$ Anxiety Difficulty   Very difficult Somewhat difficult      Upstream - 06/07/22 0911       Pregnancy Intention Screening   Does the patient want to become pregnant in the next year? N/A    Does the patient's partner want to become pregnant in the next year? N/A    Would the patient like to discuss contraceptive options today? N/A      Contraception Wrap Up   Current Method Female Sterilization   hyst   End Method Female Sterilization   hyst   Contraception Counseling Provided No             Assessment:     1. Anxiety and depression Will increase lexapro to 20 mg 1 daily Meds ordered this encounter  Medications   escitalopram (LEXAPRO) 20 MG tablet    Sig: Take 1 tablet (20 mg total) by mouth daily.    Dispense:  90 tablet    Refill:  1    Order Specific Question:   Supervising Provider    Answer:  EURE, LUTHER H [[id number]]   Follow up in 8 weeks   2. Dizzy Try 2.5 mg valium every 8 hours prn  Follow up with PCP  3. Bruises easily Check CMP and PT/INR  4. Essential hypertension, benign Take 1/2 tablet zestorectic every daily has only been taking occasionally Keep check on BP at home Will recheck in 8 week   5. Vertigo Feels off balance Has nausea  Try valium 2.5 mg every 8 hours prn Has valium at home 5 mg so take 1/2 tablet   6. Bleeds easily ([place]) Check PT.INR    Plan:     Follow up in 8 weeks for ROS

## 2022-06-08 LAB — COMPREHENSIVE METABOLIC PANEL
ALT: 29 IU/L (ref 0–32)
AST: 28 IU/L (ref 0–40)
Albumin/Globulin Ratio: 1.8 (ref 1.2–2.2)
Albumin: 4.2 g/dL (ref 3.8–4.9)
Alkaline Phosphatase: 67 IU/L (ref 44–121)
BUN/Creatinine Ratio: 23 (ref 9–23)
BUN: 16 mg/dL (ref 6–24)
Bilirubin Total: 0.4 mg/dL (ref 0.0–1.2)
CO2: 22 mmol/L (ref 20–29)
Calcium: 9.2 mg/dL (ref 8.7–10.2)
Chloride: 108 mmol/L — ABNORMAL HIGH (ref 96–106)
Creatinine, Ser: 0.71 mg/dL (ref 0.57–1.00)
Globulin, Total: 2.3 g/dL (ref 1.5–4.5)
Glucose: 108 mg/dL — ABNORMAL HIGH (ref 70–99)
Potassium: 4.9 mmol/L (ref 3.5–5.2)
Sodium: 143 mmol/L (ref 134–144)
Total Protein: 6.5 g/dL (ref 6.0–8.5)
eGFR: 103 mL/min/{1.73_m2} (ref >59)

## 2022-06-09 ENCOUNTER — Telehealth: Payer: Self-pay

## 2022-06-09 NOTE — Telephone Encounter (Signed)
I tired calling patient again and sent a my chart message to see if she is planning on getting the breast u/s. Left message for the patient to call me back. ep

## 2022-06-15 ENCOUNTER — Encounter: Payer: Self-pay | Admitting: Gastroenterology

## 2022-06-15 ENCOUNTER — Ambulatory Visit (HOSPITAL_COMMUNITY)
Admission: RE | Admit: 2022-06-15 | Discharge: 2022-06-15 | Disposition: A | Discharge status: Home / Self Care | Payer: BC Managed Care – PPO | Source: Ambulatory Visit | Attending: Gastroenterology | Admitting: Gastroenterology

## 2022-06-15 ENCOUNTER — Ambulatory Visit (INDEPENDENT_AMBULATORY_CARE_PROVIDER_SITE_OTHER): Payer: BC Managed Care – PPO | Admitting: Gastroenterology

## 2022-06-15 VITALS — BP 143/98 | HR 66 | Temp 97.5°F | Ht 65.0 in | Wt 225.8 lb

## 2022-06-15 DIAGNOSIS — K5900 Constipation, unspecified: Secondary | ICD-10-CM | POA: Diagnosis not present

## 2022-06-15 DIAGNOSIS — K5909 Other constipation: Secondary | ICD-10-CM | POA: Insufficient documentation

## 2022-06-15 NOTE — Patient Instructions (Signed)
Please have blood work done at [facility].  I have also ordered an Insurance account manager at [facility]. This is a walk in appointment. I want to check this before we do any bowel purge. Wait to hear from [place] regarding this.  After review of xray, you will take 1 capful of Miralax mixed in 8 ounces of water every hour up to 6 doses to get things moving.  Tomorrow, you can start taking Linzess 290 micrograms once each morning, 30 minutes before breakfast.  If you are unable to tolerate liquids, severe abdominal pain, repetitive vomiting, please go to the emergency room.  I would like to see you in 6-8 weeks!  It was a pleasure to see you today. I want to create trusting relationships with patients and provide genuine, compassionate, and quality care. I truly value your feedback, so please be on the lookout for a survey regarding your visit with me today. I appreciate your time in completing this!    Annitta Needs, PhD, ANP-BC [facility]

## 2022-06-15 NOTE — Progress Notes (Signed)
Gastroenterology Office Note     Primary Care Physician:  Kathyrn Drown, MD  Primary Gastroenterologist: Dr. Gala Romney    Chief Complaint   Chief Complaint  Patient presents with   Follow-up    Follow up-diverticulitis,  not able to have BM or eat     History of Present Illness   Theresa [name] is a 52 y.o. female presenting today in follow-up with a history of chronic GERD, constipation, intermittent bouts of LLQ abdominal pain and documented sigmoid diverticulitis on CT in April 2021, last seen 2023.   Feels like "pom poms" in stomach. Has been vomiting for past 2 days. Started Sunday. Feels uncomfortable. Able to keep liquids down but feels it is staying in esophagus. Feels full upper abdomen. Lots of burping. Feels nauseated. Feels like abdomen is distended. Solid and soft foods come back up. Urine is dark.   Hasn't had a BM in over a week. Takes Linzess 72 mcg daily. Has rumbling in abdomen. Feels discomfort, not pain. No fever. Had been doing well prior to constipation. Feels constipated prior to diverticulitis flares.   Colonoscopy 2021: sigmoid and descending colon diverticulosis, one 3 mm polyp in cecum, one 4 mm polyp in descending colon. Hyperplastic. Surveillance due in 2028. Distant history of advanced adenoma in 2013.   2021 EGD: normal esophagus s/p dilation, normal stomach, normal duodenum.     Past Medical History:  Diagnosis Date   Anxiety    Depression    Fibromyalgia    GERD (gastroesophageal reflux disease)    Headache(784.0)    Heart murmur    Hyperplastic colon polyp 10/28/2019   Dr Sydell Axon recommends repeat colonoscopy in 2028.   IFG (impaired fasting glucose)    Obsessive-compulsive disorder    Osteoarthritis    Seizures ([place])    last seizure at age 31-stressed induced seizure.    Past Surgical History:  Procedure Laterality Date   ABDOMINAL HYSTERECTOMY     still has right ovary   BIOPSY N/A 03/05/2014   Procedure: DUODENAL AND  GASTRIC BIOPSY;  Surgeon: Danie Binder, MD;  Location: [facility];  Service: Endoscopy;  Laterality: N/A;   bladder sling X 3     CHILECTOMY Left 03/12/2020   Procedure: CHILECTOMY HALLUX;  Surgeon: Tyson Babinski, DPM;  Location: [facility];  Service: Podiatry;  Laterality: Left;   CHOLECYSTECTOMY     COLONOSCOPY  04/24/2012   [id number] polyp measuring 5 mm in size was found in the proximal transverse colon; polypectomy was performed with cold forceps Pedunculated polyp measuring 1.1 cm in size was found in the sigmoid colon; polypectomy was performed using snare cautery Moderate diverticulosis was noted in the descending colon and sigmoid colon small internal hemorrhoids. next tcs 04/2017   COLONOSCOPY WITH PROPOFOL N/A 10/23/2019   sigmoid and descending colon diverticulosis, one 3 mm polyp in cecum, one 4 mm polyp in descending colon. Hyperplastic   ESOPHAGOGASTRODUODENOSCOPY N/A 02/18/2014   Procedure: ESOPHAGOGASTRODUODENOSCOPY (EGD);  Surgeon: Danie Binder, MD;  Location: [facility];  Service: Endoscopy;  Laterality: N/A;  115   ESOPHAGOGASTRODUODENOSCOPY (EGD) WITH PROPOFOL N/A 03/05/2014   SLF: 1. dyspepsia due to constipation, gastritis, and GERD 2. Multiple Gastric polyps 3. Moderate non-erosive gastritis 4. Diverticulum in the 2nd part of the duodenum   ESOPHAGOGASTRODUODENOSCOPY (EGD) WITH PROPOFOL N/A 10/23/2019   normal esophagus s/p dilation, normal stomach, normal duodenum.   MALONEY DILATION N/A 10/23/2019   Procedure: MALONEY DILATION;  Surgeon: Gala Romney,  Cristopher Estimable, MD;  Location: [facility];  Service: Endoscopy;  Laterality: N/A;   POLYPECTOMY  10/23/2019   Procedure: POLYPECTOMY;  Surgeon: Daneil Dolin, MD;  Location: [facility];  Service: Endoscopy;;   TUBAL LIGATION      Current Outpatient Medications  Medication Sig Dispense Refill   dexlansoprazole (DEXILANT) 60 MG capsule TAKE ONE CAPSULE BY MOUTH ONCE DAILY 30 capsule 3   escitalopram (LEXAPRO) 20 MG  tablet Take 1 tablet (20 mg total) by mouth daily. 90 tablet 1   GARLIC PO Take by mouth. Garlic, honey and cayanne-BID     linaclotide (LINZESS) 72 MCG capsule Take 1 capsule (72 mcg total) by mouth daily before breakfast. 30 capsule 11   lisinopril-hydrochlorothiazide (ZESTORETIC) 20-25 MG tablet Take 1 tablet by mouth daily. 30 tablet 3   ondansetron (ZOFRAN) 8 MG tablet TAKE  (1)  TABLET  THREE TIMES DAILY AS NEEDED FOR NAUSEA. 15 tablet 3   TURMERIC PO Take by mouth daily. Tea with ginger     No current facility-administered medications for this visit.    Allergies as of 06/15/2022 - Review Complete 06/15/2022  Allergen Reaction Noted   Fentanyl Other (See Comments) 09/29/2019   Prednisone  02/04/2020    Family History  Problem Relation Age of Onset   Alcohol abuse Mother    Depression Mother    Anxiety disorder Mother    Paranoid behavior Mother    Hypertension Mother    Diabetes Mother    Breast cancer Mother    Alcohol abuse Father    Hypertension Father    Diabetes Father    Physical abuse Brother    Healthy Daughter    GER disease Son    Healthy Son    Colon cancer Neg Hx    ADD / ADHD Neg Hx    Bipolar disorder Neg Hx    Dementia Neg Hx    OCD Neg Hx    Drug abuse Neg Hx    Schizophrenia Neg Hx    Seizures Neg Hx    Sexual abuse Neg Hx     Social History   Socioeconomic History   Marital status: Divorced    Spouse name: Not on file   Number of children: 2   Years of education: Not on file   Highest education level: Not on file  Occupational History   Occupation: OFFICE MGR    Employer: [employer]    Comment: Radio producer  Tobacco Use   Smoking status: Never   Smokeless tobacco: Never  Vaping Use   Vaping Use: Never used  Substance and Sexual Activity   Alcohol use: Yes    Comment: occas   Drug use: No   Sexual activity: Not Currently    Birth control/protection: Surgical    Comment: tubal, hyst  Other Topics Concern    Not on file  Social History Narrative   Not on file   Social Determinants of Health   Financial Resource Strain: Medium Risk (04/12/2022)   Overall Financial Resource Strain (CARDIA)    Difficulty of Paying Living Expenses: Somewhat hard  Food Insecurity: No Food Insecurity (04/12/2022)   Hunger Vital Sign    Worried About Running Out of Food in the Last Year: Never true    Ran Out of Food in the Last Year: Never true  Transportation Needs: No Transportation Needs (04/12/2022)   PRAPARE - Hydrologist (Medical): No  Lack of Transportation (Non-Medical): No  Physical Activity: Insufficiently Active (04/12/2022)   Exercise Vital Sign    Days of Exercise per Week: 2 days    Minutes of Exercise per Session: 20 min  Stress: Stress Concern Present (04/12/2022)   [place]    Feeling of Stress : Very much  Social Connections: Moderately Isolated (04/12/2022)   Social Connection and Isolation Panel [NHANES]    Frequency of Communication with Friends and Family: More than three times a week    Frequency of Social Gatherings with Friends and Family: Twice a week    Attends Religious Services: 1 to 4 times per year    Active Member of [employer] or Organizations: No    Attends Archivist Meetings: Never    Marital Status: Divorced  Human resources officer Violence: Not At Risk (04/12/2022)   Humiliation, Afraid, Rape, and Kick questionnaire    Fear of Current or Ex-Partner: No    Emotionally Abused: No    Physically Abused: No    Sexually Abused: No     Review of Systems   Gen: Denies any fever, chills, fatigue, weight loss, lack of appetite.  CV: Denies chest pain, heart palpitations, peripheral edema, syncope.  Resp: Denies shortness of breath at rest or with exertion. Denies wheezing or cough.  GI: Denies dysphagia or odynophagia. Denies jaundice, hematemesis, fecal  incontinence. GU : Denies urinary burning, urinary frequency, urinary hesitancy MS: Denies joint pain, muscle weakness, cramps, or limitation of movement.  Derm: Denies rash, itching, dry skin Psych: Denies depression, anxiety, memory loss, and confusion Heme: Denies bruising, bleeding, and enlarged lymph nodes.   Physical Exam   BP (!) 143/98   Pulse 66   Temp (!) 97.5 F (36.4 C)   Ht 5' 5"$  (1.651 m)   Wt 225 lb 12.8 oz (102.4 kg)   BMI 37.58 kg/m  General:   Alert and oriented. Pleasant and cooperative. Well-nourished and well-developed.  Head:  Normocephalic and atraumatic. Eyes:  Without icterus Abdomen:  +BS, soft, full but non-distended and non-tender. No HSM noted. No guarding or rebound. No masses appreciated.  Rectal:  no fecal impaction on DRE Msk:  Symmetrical without gross deformities. Normal posture. Extremities:  Without edema. Neurologic:  Alert and  oriented x4;  grossly normal neurologically. Skin:  Intact without significant lesions or rashes. Psych:  Alert and cooperative. Normal mood and affect.   Assessment   Theresa [name] is a 52 y.o. female presenting today in follow-up with a history of chronic GERD, constipation, intermittent bouts of LLQ abdominal pain and documented sigmoid diverticulitis on CT in April 2021, last seen 2023.   Now with constipation/obstipation and no BM in over a week; physical exam without concerning findings and no fecal impaction on DRE. Does not seem consistent with diverticulitis flare. Will check abdominal xray and labs first, ensuring no ileus/obstruction prior to purge. May need additional imaging. I suspect will feel relief once this is complete. Will also need to start Linzess 290 mcg daily following purge.   Depending on clinical course, may need CT if no significant improvement.      PLAN   CBC, CMP Abdominal xray stat Will then do Miralax purge Start Linzess 290 mcg daily after Miralax purge Call if any  worsening symptoms Return in 6-8 weeks for close follow-up   Annitta Needs, PhD, ANP-BC [facility]

## 2022-06-16 LAB — COMPREHENSIVE METABOLIC PANEL
ALT: 37 IU/L — ABNORMAL HIGH (ref 0–32)
AST: 27 IU/L (ref 0–40)
Albumin/Globulin Ratio: 1.8 (ref 1.2–2.2)
Albumin: 4.3 g/dL (ref 3.8–4.9)
Alkaline Phosphatase: 80 IU/L (ref 44–121)
BUN/Creatinine Ratio: 14 (ref 9–23)
BUN: 10 mg/dL (ref 6–24)
Bilirubin Total: 0.6 mg/dL (ref 0.0–1.2)
CO2: 25 mmol/L (ref 20–29)
Calcium: 9.6 mg/dL (ref 8.7–10.2)
Chloride: 107 mmol/L — ABNORMAL HIGH (ref 96–106)
Creatinine, Ser: 0.73 mg/dL (ref 0.57–1.00)
Globulin, Total: 2.4 g/dL (ref 1.5–4.5)
Glucose: 92 mg/dL (ref 70–99)
Potassium: 4.3 mmol/L (ref 3.5–5.2)
Sodium: 144 mmol/L (ref 134–144)
Total Protein: 6.7 g/dL (ref 6.0–8.5)
eGFR: 100 mL/min/{1.73_m2} (ref >59)

## 2022-06-16 LAB — CBC WITH DIFFERENTIAL/PLATELET
Basophils Absolute: 0.1 10*3/uL (ref 0.0–0.2)
Basos: 1 %
EOS (ABSOLUTE): 0.2 10*3/uL (ref 0.0–0.4)
Eos: 3 %
Hematocrit: 37.5 % (ref 34.0–46.6)
Hemoglobin: 12.8 g/dL (ref 11.1–15.9)
Immature Grans (Abs): 0 10*3/uL (ref 0.0–0.1)
Immature Granulocytes: 0 %
Lymphocytes Absolute: 2.2 10*3/uL (ref 0.7–3.1)
Lymphs: 38 %
MCH: 31.1 pg (ref 26.6–33.0)
MCHC: 34.1 g/dL (ref 31.5–35.7)
MCV: 91 fL (ref 79–97)
Monocytes Absolute: 0.5 10*3/uL (ref 0.1–0.9)
Monocytes: 8 %
Neutrophils Absolute: 3 10*3/uL (ref 1.4–7.0)
Neutrophils: 50 %
Platelets: 241 10*3/uL (ref 150–450)
RBC: 4.12 x10E6/uL (ref 3.77–5.28)
RDW: 12.7 % (ref 11.7–15.4)
WBC: 5.9 10*3/uL (ref 3.4–10.8)

## 2022-06-20 MED ORDER — CIPROFLOXACIN HCL 500 MG PO TABS: 500.0000 mg | ORAL_TABLET | Freq: Two times a day (BID) | ORAL | 0 refills | Status: AC

## 2022-06-20 MED ORDER — METRONIDAZOLE 500 MG PO TABS: 500.0000 mg | ORAL_TABLET | Freq: Three times a day (TID) | ORAL | 0 refills | Status: AC

## 2022-06-20 MED ORDER — ONDANSETRON HCL 8 MG PO TABS: ORAL_TABLET | ORAL | 1 refills | Status: DC

## 2022-06-21 ENCOUNTER — Telehealth: Payer: Self-pay | Admitting: Gastroenterology

## 2022-06-21 NOTE — Telephone Encounter (Signed)
Dena, I sent a MyChart to patient yesterday, but she hasn't read it.  Can we touch base with her and make sure she is doing ok?

## 2022-06-22 NOTE — Telephone Encounter (Signed)
Phoned and LMOVM for the pt to look at her MyChart message and or to return call to me to let me know

## 2022-06-22 NOTE — Telephone Encounter (Signed)
Copied message from yesterday:  Theresa [name],   Is pain in the left lower side like prior diverticulitis flares? I am glad you had some relief. I am sending in Zofran for nausea. I am also sending in a brief course of antibiotics to take in case we are dealing with diverticulitis flare. We may need to do a CT scan if anything worsens. I am off tomorrow (Monday) due to sickness but my box will be covered. Please keep [place] updated with how you are doing! Back off to a low fiber diet. Just do soft foods, easily digested foods, etc.

## 2022-06-23 NOTE — Telephone Encounter (Signed)
Phoned and LMOVM for the pt to return call 

## 2022-06-24 NOTE — Telephone Encounter (Signed)
Letter mailed to the pt. 

## 2022-07-01 ENCOUNTER — Encounter: Payer: Self-pay | Admitting: Radiology

## 2022-07-06 ENCOUNTER — Telehealth: Payer: Self-pay | Admitting: Orthopedic Surgery

## 2022-07-06 NOTE — Telephone Encounter (Signed)
Patient lvm stating that her knee is giving her a fit, really painful.  She stated that she is taking the Naproxen and Ibuprofen and it is not helping.  She is wanting to know if something can be called in for her that will help.   [phone]

## 2022-07-07 NOTE — Telephone Encounter (Signed)
She has been using Castor oil tumeric black pepper Ibuprofen 800 prn she said can't use much of that due to GERD  Advised her to use Tylenol and make appointment when [facility] calls her back Told her if you advise anything differently to let us know.

## 2022-07-07 NOTE — Telephone Encounter (Signed)
At last note Dr Aline Brochure said She is agreeable and will follow-up in 4 to 6 weeks to see if the knee exercises help her hyperextension and patellofemoral pain   There is no follow up , that was in November She needs to come in to follow up, please.   I will call her also about meds, to see what she is taking.

## 2022-07-08 ENCOUNTER — Telehealth: Payer: Self-pay

## 2022-07-08 NOTE — Telephone Encounter (Signed)
Returned the pt's call was sent to her vm. LMOVM

## 2022-07-09 ENCOUNTER — Other Ambulatory Visit: Payer: Self-pay | Admitting: Gastroenterology

## 2022-07-09 DIAGNOSIS — K5900 Constipation, unspecified: Secondary | ICD-10-CM

## 2022-07-09 MED ORDER — LINACLOTIDE 290 MCG PO CAPS: 290.0000 ug | ORAL_CAPSULE | Freq: Every day | ORAL | 5 refills | Status: DC

## 2022-07-09 NOTE — Telephone Encounter (Signed)
Rx sent 

## 2022-07-09 NOTE — Telephone Encounter (Signed)
Returned the pt's call and was advised to have the Theresa [name] to send in a formal Rx for Linzess 290 mcg to her pharmacy. Pt was given 2 boxes of Linzess 290 mcg.

## 2022-07-09 NOTE — Telephone Encounter (Signed)
Noted  Phoned the pt and advised of her Rx being sent to the pharmacy

## 2022-07-16 ENCOUNTER — Ambulatory Visit: Payer: BC Managed Care – PPO | Admitting: Nurse Practitioner

## 2022-07-16 DIAGNOSIS — Z713 Dietary counseling and surveillance: Secondary | ICD-10-CM | POA: Diagnosis not present

## 2022-07-16 MED ORDER — ZEPBOUND 2.5 MG/0.5ML ~~LOC~~ SOAJ: 2.5000 mg | SUBCUTANEOUS | 0 refills | Status: DC

## 2022-07-16 NOTE — Progress Notes (Unsigned)
   Subjective:    Patient ID: COURTLYNN MINUS, female    DOB: 1971-05-01, 52 y.o.   MRN: [id number]  HPI  Patient arrives to discuss weight and weight loss options. Currently very active helping her son renovate his new house.  Otherwise no regular activity.  Tries to eat a healthy diet.  States the phentermine gave her energy but did not notice significant weight loss.  Would like to try a different medication.  Denies any personal history of pancreatitis or family history of endocrine cancers including thyroid or pancreatic cancer. Followed by GI specialist for GERD and constipation.  Other preventive health physical lab work per gynecology.  Review of Systems  Constitutional:  Positive for fatigue.  Respiratory:  Negative for cough, chest tightness and shortness of breath.   Cardiovascular:  Negative for chest pain and palpitations.      07/16/2022    4:23 PM  Depression screen PHQ 2/9  Decreased Interest 1  Down, Depressed, Hopeless 0  PHQ - 2 Score 1  Altered sleeping 0  Tired, decreased energy 2  Change in appetite 1  Feeling bad or failure about yourself  0  Trouble concentrating 3  Moving slowly or fidgety/restless 0  Suicidal thoughts 0  PHQ-9 Score 7  Difficult doing work/chores Somewhat difficult      07/16/2022    4:24 PM 06/07/2022    9:14 AM 04/12/2022    8:52 AM 06/26/2021   11:39 AM  GAD 7 : Generalized Anxiety Score  Nervous, Anxious, on Edge 0 2 3 2   Control/stop worrying 0 3 3 3   Worry too much - different things 0 3 3 3   Trouble relaxing 1 2 3 2   Restless 0 1 2 1   Easily annoyed or irritable 1 2 3 2   Afraid - awful might happen 1 2 1 2   Total GAD 7 Score 3 15 18 15   Anxiety Difficulty Not difficult at all   Very difficult         Objective:   Physical Exam NAD.  Alert, oriented.  Lungs clear.  Heart regular rate rhythm. Today's Vitals   07/16/22 1622  BP: 113/79  Weight: 223 lb 3.2 oz (101.2 kg)  Height: 5\' 5"  (1.651 m)   Body mass index is  37.14 kg/m.      Assessment & Plan:  1. Morbid obesity ([place]) Discussed options.  Trial of low-dose Zepbound if this could be covered by insurance.  Advised patient that most insurances are not covering it very well.  There is also concerns because of her significant GI history.  Patient advised to discontinue medication if any worsening of GERD or constipation symptoms. - tirzepatide (ZEPBOUND) 2.5 MG/0.5ML Pen; Inject 2.5 mg into the skin once a week.  Dispense: 2 mL; Refill: 0 Follow-up will depend on the patient can afford medication and tolerate.

## 2022-07-17 ENCOUNTER — Encounter: Payer: Self-pay | Admitting: Nurse Practitioner

## 2022-07-22 ENCOUNTER — Encounter: Payer: Self-pay | Admitting: Nurse Practitioner

## 2022-07-22 ENCOUNTER — Ambulatory Visit: Payer: BC Managed Care – PPO | Admitting: Orthopedic Surgery

## 2022-07-22 DIAGNOSIS — H524 Presbyopia: Secondary | ICD-10-CM | POA: Diagnosis not present

## 2022-07-22 DIAGNOSIS — H11153 Pinguecula, bilateral: Secondary | ICD-10-CM | POA: Diagnosis not present

## 2022-07-22 DIAGNOSIS — H2513 Age-related nuclear cataract, bilateral: Secondary | ICD-10-CM | POA: Diagnosis not present

## 2022-07-22 DIAGNOSIS — H5203 Hypermetropia, bilateral: Secondary | ICD-10-CM | POA: Diagnosis not present

## 2022-07-27 ENCOUNTER — Encounter: Payer: Self-pay | Admitting: Nurse Practitioner

## 2022-07-30 IMAGING — DX DG CHEST 1V PORT
1 series · 1 of 1 positions shown · non-contrast
Comparison: December 21, 2019

CLINICAL DATA: SCCG6-QG.

EXAM:
PORTABLE CHEST 1 VIEW

[chest ap]
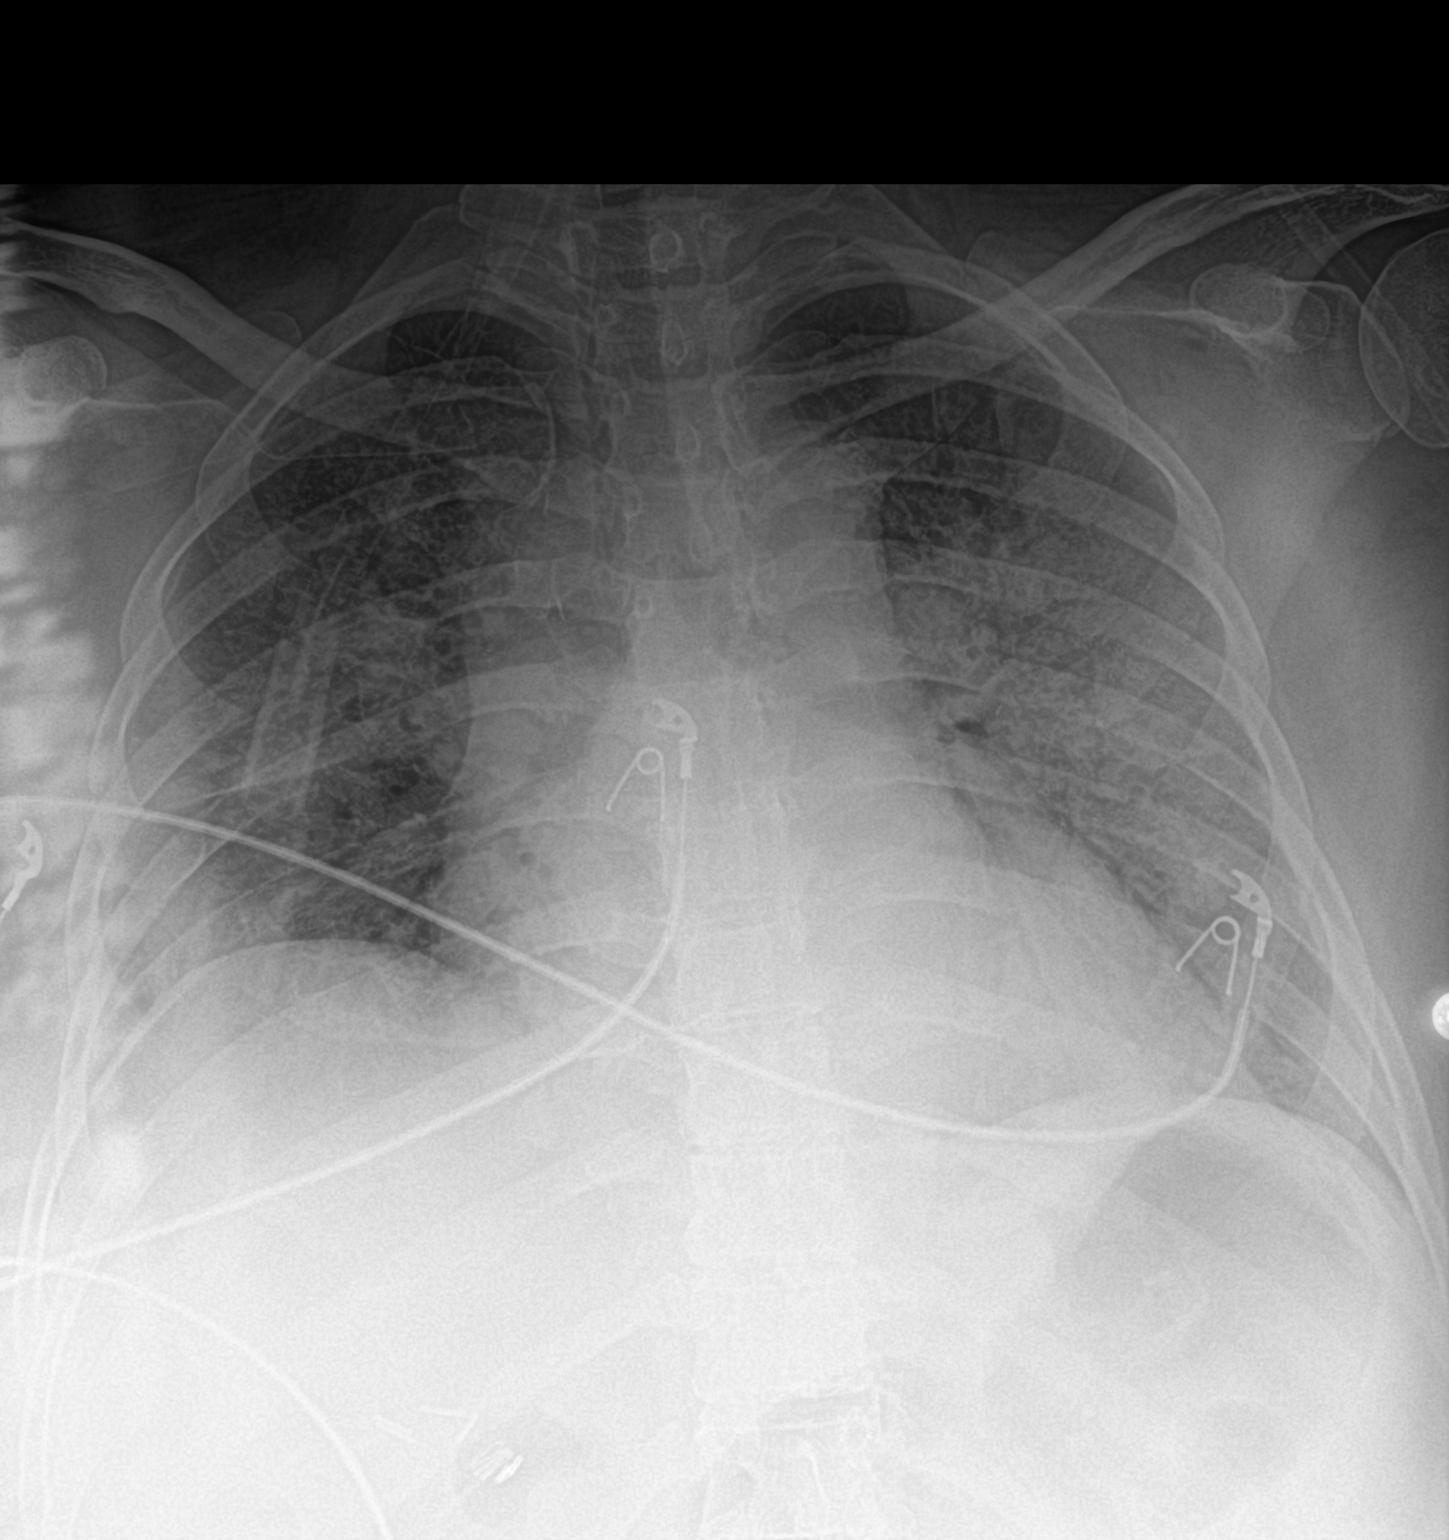

[1 of 1 positions shown; findings below may reference images not displayed]

FINDINGS: Bilateral pulmonary infiltrates are less patchy more diffuse in the
interval. The cardiomediastinal silhouette is stable. No
pneumothorax.
IMPRESSION: The patient's known SCCG6-QG pneumonia is less patchy and more
diffuse in the interval, particularly on the left. No other changes.

## 2022-07-31 IMAGING — DX DG CHEST 1V PORT
1 series · 1 of 1 positions shown · non-contrast
Comparison: 12/23/2019

CLINICAL DATA: Shortness of breath, cough, and intermittent oxygen
desaturation. KY5E8-OA positive.

EXAM:
PORTABLE CHEST 1 VIEW

[chest ap]
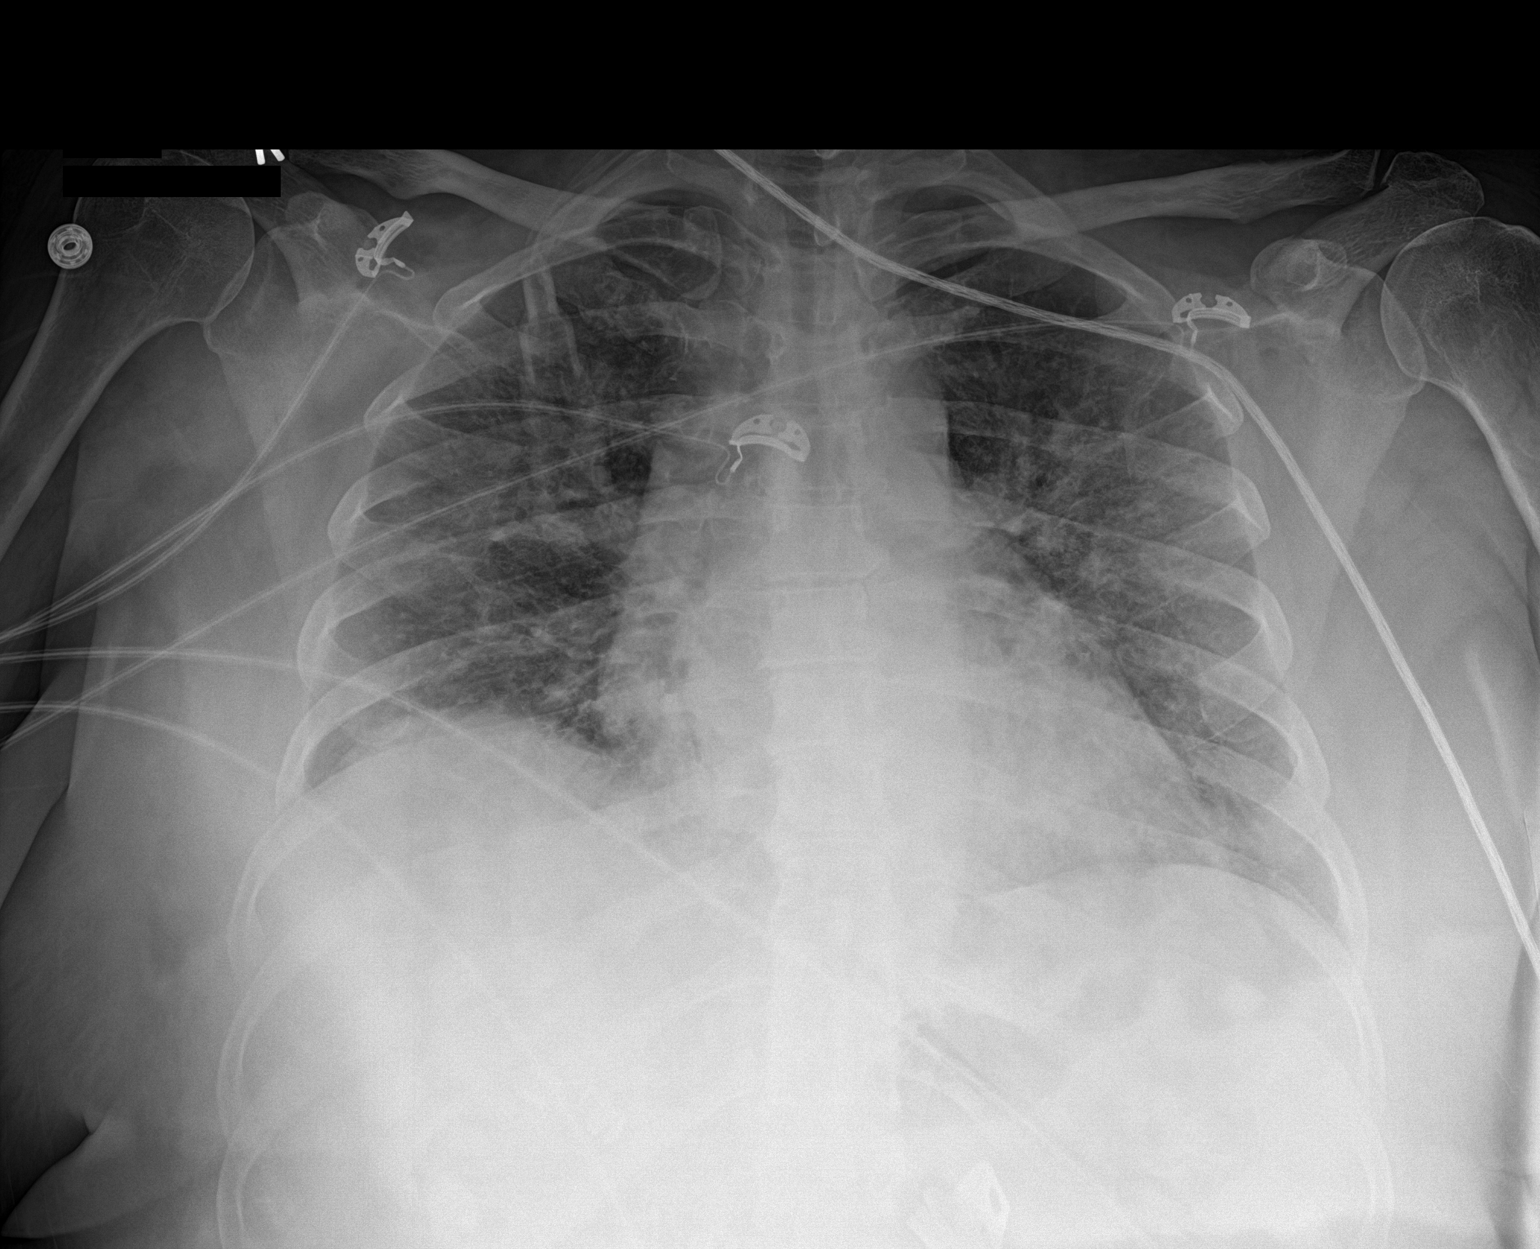

[1 of 1 positions shown; findings below may reference images not displayed]

FINDINGS: The cardiomediastinal silhouette is unchanged. Lung volumes are
unchanged with persistent mild elevation of the right hemidiaphragm.
Interstitial and ill-defined airspace opacities in the left greater
than right lungs have not significantly changed. No sizable pleural
effusion or pneumothorax is identified.
IMPRESSION: Unchanged bilateral lung opacities consistent with known KY5E8-OA
pneumonia.

## 2022-08-02 ENCOUNTER — Ambulatory Visit: Payer: BC Managed Care – PPO | Admitting: Adult Health

## 2022-08-12 ENCOUNTER — Ambulatory Visit
Admission: EM | Admit: 2022-08-12 | Discharge: 2022-08-12 | Disposition: A | Discharge status: Home / Self Care | Payer: BC Managed Care – PPO | Attending: Nurse Practitioner | Admitting: Nurse Practitioner

## 2022-08-12 ENCOUNTER — Encounter: Payer: Self-pay | Admitting: Emergency Medicine

## 2022-08-12 DIAGNOSIS — L03213 Periorbital cellulitis: Secondary | ICD-10-CM

## 2022-08-12 MED ORDER — CLINDAMYCIN HCL 300 MG PO CAPS: 300.0000 mg | ORAL_CAPSULE | Freq: Three times a day (TID) | ORAL | 0 refills | Status: AC

## 2022-08-12 NOTE — Discharge Instructions (Addendum)
I am concerned the skin in your lower eyelid is infected.  Please take the clindamycin as prescribed in addition to the cefzil.   Follow up with [place] or PCP of eye redness and swelling persists or worsens despite treatment.

## 2022-08-12 NOTE — ED Triage Notes (Signed)
Right eye red and swollen x 3 days.  States she is currently taking Cefzil due to a bad tooth.   States eye itches and painful at night.

## 2022-08-12 NOTE — ED Provider Notes (Signed)
[facility]    CSN: [id number] Arrival date & time: 08/12/22  1422      History   Chief Complaint No chief complaint on file.   HPI Theresa [name] is a 52 y.o. female.   Patient presents today for right undereye swelling, redness, and pain for the past 3 days.  Reports initially, she was seen by her dentist and started on Cefzil for a dental abscess.  The swelling in the face has gone down some, however she is still concerned about the redness under her eye.  Also reports her right eye has been tearing and more itchy than normal.  No blurred vision/double vision or headache.  No sore throat, ear pain, cough, congestion, or fever.     Past Medical History:  Diagnosis Date   Anxiety    Depression    Fibromyalgia    GERD (gastroesophageal reflux disease)    Headache(784.0)    Heart murmur    Hyperplastic colon polyp 10/28/2019   Dr Kendell Baneourke recommends repeat colonoscopy in 2028.   IFG (impaired fasting glucose)    Obsessive-compulsive disorder    Osteoarthritis    Seizures    last seizure at age 52-stressed induced seizure.    Patient Active Problem List   Diagnosis Date Noted   Constipation 06/15/2022   Bruises easily 06/07/2022   Dizzy 06/07/2022   Vertigo 06/07/2022   Encounter for well woman exam with routine gynecological exam 04/12/2022   Encounter for screening fecal occult blood testing 04/12/2022   Screening examination for STD (sexually transmitted disease) 04/12/2022   Screening for diabetes mellitus 04/12/2022   Screening cholesterol level 04/12/2022   Screening for thyroid disorder 04/12/2022   Dyspareunia in female 04/12/2022   S/P hysterectomy 04/12/2022   Recurrent UTI 12/11/2021   SUI (stress urinary incontinence, female) 12/11/2021   Urinary urgency 12/11/2021   Overactive bladder 12/11/2021   Hypoestrogenism 12/11/2021   Nocturnal leg cramps 06/28/2021   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/02/2020   Acute respiratory failure with  hypoxia 12/22/2019   COVID-19 12/21/2019   Pneumonia due to COVID-19 virus 12/18/2019   Attention deficit disorder (ADD) without hyperactivity 11/15/2019   Hyperplastic colon polyp 10/28/2019   History of adenomatous polyp of colon 08/28/2019   Dysphagia 08/28/2019   Acute diverticulitis 08/13/2019   Hypokalemia 08/13/2019   Elevated liver transaminase level    Hair loss 07/11/2019   Body aches 07/11/2019   Brain fog 07/11/2019   Rectocele 07/11/2019   Sleep disturbance 07/11/2019   Gastroesophageal reflux disease without esophagitis 05/21/2019   Cervical radiculopathy 02/27/2019   Hypertensive disorder 02/27/2019   Adhesive capsulitis of right shoulder 11/06/2018   Prediabetes 11/26/2017   Migraine without aura and without status migrainosus, not intractable 10/04/2017   Morbid obesity 04/10/2016   Diverticulosis of large intestine 12/05/2015   BRCA negative 02/10/2015   IFG (impaired fasting glucose) 07/05/2014   Essential hypertension, benign 04/27/2014   Anxiety 04/27/2014   Abdominal pain, epigastric 02/07/2014   Nausea without vomiting 02/07/2014   Bloating 02/07/2014   Other fatigue 09/18/2012   Hyperglycemia 09/18/2012   Anxiety and depression 07/14/2012   Insomnia due to mental disorder 07/14/2012   Lower abdominal pain 04/12/2012   Headache 04/12/2012   Irritable bowel syndrome with constipation 04/12/2012    Past Surgical History:  Procedure Laterality Date   ABDOMINAL HYSTERECTOMY     still has right ovary   BIOPSY N/A 03/05/2014   Procedure: DUODENAL AND GASTRIC BIOPSY;  Surgeon: Duncan DullSandi  Loreen Freud, MD;  Location: [facility];  Service: Endoscopy;  Laterality: N/A;   bladder sling X 3     CHILECTOMY Left 03/12/2020   Procedure: CHILECTOMY HALLUX;  Surgeon: Erskine Emery, DPM;  Location: [facility];  Service: Podiatry;  Laterality: Left;   CHOLECYSTECTOMY     COLONOSCOPY  04/24/2012   MOQ:HUTMLYY polyp measuring 5 mm in size was found in the proximal transverse  colon; polypectomy was performed with cold forceps Pedunculated polyp measuring 1.1 cm in size was found in the sigmoid colon; polypectomy was performed using snare cautery Moderate diverticulosis was noted in the descending colon and sigmoid colon small internal hemorrhoids. next tcs 04/2017   COLONOSCOPY WITH PROPOFOL N/A 10/23/2019   sigmoid and descending colon diverticulosis, one 3 mm polyp in cecum, one 4 mm polyp in descending colon. Hyperplastic   ESOPHAGOGASTRODUODENOSCOPY N/A 02/18/2014   Procedure: ESOPHAGOGASTRODUODENOSCOPY (EGD);  Surgeon: West Bali, MD;  Location: [facility];  Service: Endoscopy;  Laterality: N/A;  115   ESOPHAGOGASTRODUODENOSCOPY (EGD) WITH PROPOFOL N/A 03/05/2014   SLF: 1. dyspepsia due to constipation, gastritis, and GERD 2. Multiple Gastric polyps 3. Moderate non-erosive gastritis 4. Diverticulum in the 2nd part of the duodenum   ESOPHAGOGASTRODUODENOSCOPY (EGD) WITH PROPOFOL N/A 10/23/2019   normal esophagus s/p dilation, normal stomach, normal duodenum.   MALONEY DILATION N/A 10/23/2019   Procedure: Elease Hashimoto DILATION;  Surgeon: Corbin Ade, MD;  Location: [facility];  Service: Endoscopy;  Laterality: N/A;   POLYPECTOMY  10/23/2019   Procedure: POLYPECTOMY;  Surgeon: Corbin Ade, MD;  Location: [facility];  Service: Endoscopy;;   TUBAL LIGATION      OB History     Gravida  2   Para  2   Term      Preterm      AB  0   Living  2      SAB      IAB      Ectopic  0   Multiple      Live Births  2            Home Medications    Prior to Admission medications   Medication Sig Start Date End Date Taking? Authorizing Provider  clindamycin (CLEOCIN) 300 MG capsule Take 1 capsule (300 mg total) by mouth 3 (three) times daily for 7 days. 08/12/22 08/19/22 Yes Valentino Nose, NP  amphetamine-dextroamphetamine (ADDERALL XR) 20 MG 24 hr capsule     [provider]  dexlansoprazole (DEXILANT) 60 MG capsule TAKE  ONE CAPSULE BY MOUTH ONCE DAILY 05/31/22   Rourk, Gerrit Friends, MD  escitalopram (LEXAPRO) 20 MG tablet Take 1 tablet (20 mg total) by mouth daily. 06/07/22   Adline Potter, NP  GARLIC PO Take by mouth. Garlic, honey and cayanne-BID    [provider]  linaclotide (LINZESS) 290 MCG CAPS capsule Take 1 capsule (290 mcg total) by mouth daily before breakfast. 07/09/22   Letta Median, PA-C  lisinopril-hydrochlorothiazide (ZESTORETIC) 20-25 MG tablet Take 1 tablet by mouth daily. 04/12/22   Adline Potter, NP  ondansetron (ZOFRAN) 8 MG tablet TAKE  (1)  TABLET  THREE TIMES DAILY AS NEEDED FOR NAUSEA. 06/20/22   Gelene Mink, NP  Plecanatide (TRULANCE) 3 MG TABS     [provider]  potassium chloride (KLOR-CON M) 10 MEQ tablet Take 1 tablet every day by oral route.    [provider]  Semaglutide-Weight Management (WEGOVY) 0.25 MG/0.5ML Ivory Broad  [provider]  sertraline (ZOLOFT) 50 MG tablet     [provider]  tirzepatide (ZEPBOUND) 2.5 MG/0.5ML Pen Inject 2.5 mg into the skin once a week. 07/16/22   Campbell Riches, NP  TURMERIC PO Take by mouth daily. Tea with ginger    [provider]    Family History Family History  Problem Relation Age of Onset   Alcohol abuse Mother    Depression Mother    Anxiety disorder Mother    Paranoid behavior Mother    Hypertension Mother    Diabetes Mother    Breast cancer Mother    Alcohol abuse Father    Hypertension Father    Diabetes Father    Physical abuse Brother    Healthy Daughter    GER disease Son    Healthy Son    Colon cancer Neg Hx    ADD / ADHD Neg Hx    Bipolar disorder Neg Hx    Dementia Neg Hx    OCD Neg Hx    Drug abuse Neg Hx    Schizophrenia Neg Hx    Seizures Neg Hx    Sexual abuse Neg Hx     Social History Social History   Tobacco Use   Smoking status: Never   Smokeless tobacco: Never  Vaping Use   Vaping Use: Never used  Substance Use Topics    Alcohol use: Yes    Comment: occas   Drug use: No     Allergies   Fentanyl and Prednisone   Review of Systems Review of Systems Per HPI  Physical Exam Triage Vital Signs ED Triage Vitals  Enc Vitals Group     BP 08/12/22 1426 131/79     Pulse Rate 08/12/22 1426 88     Resp 08/12/22 1426 18     Temp 08/12/22 1426 98 F (36.7 C)     Temp Source 08/12/22 1426 Oral     SpO2 08/12/22 1426 98 %     Weight --      Height --      Head Circumference --      Peak Flow --      Pain Score 08/12/22 1427 4     Pain Loc --      Pain Edu? --      Excl. in GC? --    No data found.  Updated Vital Signs BP 131/79 (BP Location: Right Arm)   Pulse 88   Temp 98 F (36.7 C) (Oral)   Resp 18   SpO2 98%   Visual Acuity Right Eye Distance: 20/80 Left Eye Distance: 20/50 Bilateral Distance: 20/50  Right Eye Near:   Left Eye Near:    Bilateral Near:     Physical Exam Vitals and nursing note reviewed.  Constitutional:      General: She is not in acute distress.    Appearance: Normal appearance. She is not toxic-appearing.  HENT:     Head: Normocephalic and atraumatic. Right periorbital erythema present.     Salivary Glands: Right salivary gland is not diffusely enlarged or tender. Left salivary gland is not diffusely enlarged or tender.     Comments: Mild right periorbital edema to lower lid that is tender to palpation and erythematous; no drianage or pain with EOM    Right Ear: Tympanic membrane, ear canal and external ear normal. There is no impacted cerumen.     Left Ear: Tympanic membrane, ear canal and external ear normal. There  is no impacted cerumen.     Mouth/Throat:     Mouth: Mucous membranes are moist.     Pharynx: Oropharynx is clear. No oropharyngeal exudate or posterior oropharyngeal erythema.  Eyes:     General: No scleral icterus.       Right eye: No foreign body, discharge or hordeolum.        Left eye: No foreign body, discharge or hordeolum.      Extraocular Movements: Extraocular movements intact.     Right eye: Normal extraocular motion.     Left eye: Normal extraocular motion.     Conjunctiva/sclera:     Right eye: Right conjunctiva is not injected. No chemosis, exudate or hemorrhage.    Pupils: Pupils are equal, round, and reactive to light.  Cardiovascular:     Rate and Rhythm: Normal rate and regular rhythm.  Pulmonary:     Effort: Pulmonary effort is normal. No respiratory distress.  Musculoskeletal:     Cervical back: Normal range of motion.  Lymphadenopathy:     Cervical: No cervical adenopathy.  Skin:    General: Skin is warm and dry.     Capillary Refill: Capillary refill takes less than 2 seconds.     Coloration: Skin is not jaundiced or pale.     Findings: No erythema.  Neurological:     Mental Status: She is alert and oriented to person, place, and time.  Psychiatric:        Behavior: Behavior is cooperative.      UC Treatments / Results  Labs (all labs ordered are listed, but only abnormal results are displayed) Labs Reviewed - No data to display  EKG   Radiology No results found.  Procedures Procedures (including critical care time)  Medications Ordered in UC Medications - No data to display  Initial Impression / Assessment and Plan / UC Course  I have reviewed the triage vital signs and the nursing notes.  Pertinent labs & imaging results that were available during my care of the patient were reviewed by me and considered in my medical decision making (see chart for details).   Patient is well-appearing, normotensive, afebrile, not tachycardic, not tachypneic, oxygenating well on room air.    1. Preseptal cellulitis of right lower eyelid Continue Cefzil as previously prescribed and start Clindamycin Eye care discussed Recommended close follow up with no improvement or worsening of symptoms despite treatment   The patient was given the opportunity to ask questions.  All questions  answered to their satisfaction.  The patient is in agreement to this plan.    Final Clinical Impressions(s) / UC Diagnoses   Final diagnoses:  Preseptal cellulitis of right lower eyelid     Discharge Instructions      I am concerned the skin in your lower eyelid is infected.  Please take the clindamycin as prescribed in addition to the cefzil.   Follow up with [place] or PCP of eye redness and swelling persists or worsens despite treatment.       ED Prescriptions     Medication Sig Dispense Auth. Provider   clindamycin (CLEOCIN) 300 MG capsule Take 1 capsule (300 mg total) by mouth 3 (three) times daily for 7 days. 21 capsule Valentino Nose, NP      PDMP not reviewed this encounter.   Valentino Nose, NP 08/12/22 [phone]

## 2022-08-13 ENCOUNTER — Encounter: Payer: Self-pay | Admitting: Nurse Practitioner

## 2022-08-17 ENCOUNTER — Ambulatory Visit: Payer: BC Managed Care – PPO | Admitting: Gastroenterology

## 2022-08-20 ENCOUNTER — Other Ambulatory Visit: Payer: Self-pay | Admitting: Nurse Practitioner

## 2022-08-20 ENCOUNTER — Encounter: Payer: Self-pay | Admitting: Nurse Practitioner

## 2022-08-21 ENCOUNTER — Other Ambulatory Visit: Payer: Self-pay | Admitting: Nurse Practitioner

## 2022-08-21 MED ORDER — SEMAGLUTIDE-WEIGHT MANAGEMENT 0.5 MG/0.5ML ~~LOC~~ SOAJ: 0.5000 mg | SUBCUTANEOUS | 2 refills | Status: DC

## 2022-08-31 ENCOUNTER — Other Ambulatory Visit: Payer: Self-pay | Admitting: Nurse Practitioner

## 2022-08-31 NOTE — Telephone Encounter (Signed)
Theresa [name]-Patient states wrong weight loss medication was sent in. It suppose to be Zepbound sent in not [facility] to [facility]

## 2022-09-06 ENCOUNTER — Other Ambulatory Visit: Payer: Self-pay | Admitting: Nurse Practitioner

## 2022-09-06 MED ORDER — ZEPBOUND 5 MG/0.5ML ~~LOC~~ SOAJ: 5.0000 mg | SUBCUTANEOUS | 2 refills | Status: DC

## 2022-09-06 NOTE — Telephone Encounter (Signed)
Nurse--- please close if answered.

## 2022-09-07 ENCOUNTER — Other Ambulatory Visit: Payer: Self-pay | Admitting: *Deleted

## 2022-09-13 ENCOUNTER — Telehealth: Payer: Self-pay

## 2022-09-13 NOTE — Telephone Encounter (Signed)
Patient has an active PA on file and was able to pick up med today at pharmacy

## 2022-09-13 NOTE — Telephone Encounter (Signed)
[employer] is calling asking for [employer] Authorization to call [phone] Quantity Limitation Exception [employer] is stating she wants it ASAP I told her this can take up to 15 days

## 2022-10-05 ENCOUNTER — Telehealth: Payer: Self-pay | Admitting: Gastroenterology

## 2022-10-05 ENCOUNTER — Telehealth: Payer: Self-pay

## 2022-10-05 NOTE — Telephone Encounter (Signed)
Patient left a message hoping she could be seen today.  She said that she has a lot of bloating, pain.  [phone]

## 2022-10-05 NOTE — Telephone Encounter (Signed)
Returned the pt's call and LMOVM to call back ?

## 2022-10-05 NOTE — Telephone Encounter (Signed)
Returned the pt's call and LMOVM. 

## 2022-10-06 NOTE — Telephone Encounter (Signed)
Sent the pt a MyChart message regarding her symptoms since we are playing phone tag. Waiting on a reply

## 2022-10-06 NOTE — Telephone Encounter (Signed)
See MyChart

## 2022-10-06 NOTE — Telephone Encounter (Signed)
Talking back and forth with the pt on her MyChart

## 2022-10-07 ENCOUNTER — Encounter: Payer: Self-pay | Admitting: Nurse Practitioner

## 2022-10-08 ENCOUNTER — Other Ambulatory Visit: Payer: Self-pay | Admitting: Nurse Practitioner

## 2022-10-08 DIAGNOSIS — R7301 Impaired fasting glucose: Secondary | ICD-10-CM

## 2022-10-12 ENCOUNTER — Other Ambulatory Visit: Payer: Self-pay | Admitting: *Deleted

## 2022-10-12 DIAGNOSIS — K5730 Diverticulosis of large intestine without perforation or abscess without bleeding: Secondary | ICD-10-CM

## 2022-10-12 DIAGNOSIS — K581 Irritable bowel syndrome with constipation: Secondary | ICD-10-CM

## 2022-10-12 NOTE — Telephone Encounter (Signed)
Referral sent 

## 2022-10-12 NOTE — Telephone Encounter (Signed)
Please refer to [facility]. Thanks!

## 2022-10-15 ENCOUNTER — Ambulatory Visit
Admission: EM | Admit: 2022-10-15 | Discharge: 2022-10-15 | Disposition: A | Discharge status: Home / Self Care | Payer: BC Managed Care – PPO | Attending: Urgent Care | Admitting: Urgent Care

## 2022-10-15 ENCOUNTER — Encounter: Payer: Self-pay | Admitting: Emergency Medicine

## 2022-10-15 ENCOUNTER — Ambulatory Visit (INDEPENDENT_AMBULATORY_CARE_PROVIDER_SITE_OTHER): Payer: BC Managed Care – PPO

## 2022-10-15 DIAGNOSIS — S86911A Strain of unspecified muscle(s) and tendon(s) at lower leg level, right leg, initial encounter: Secondary | ICD-10-CM

## 2022-10-15 DIAGNOSIS — M25561 Pain in right knee: Secondary | ICD-10-CM | POA: Diagnosis not present

## 2022-10-15 DIAGNOSIS — M1711 Unilateral primary osteoarthritis, right knee: Secondary | ICD-10-CM | POA: Diagnosis not present

## 2022-10-15 MED ORDER — CELECOXIB 200 MG PO CAPS: 200.0000 mg | ORAL_CAPSULE | Freq: Two times a day (BID) | ORAL | 0 refills | Status: DC

## 2022-10-15 NOTE — ED Provider Notes (Signed)
[facility]  Note:  This document was prepared using Dragon voice recognition software and may include unintentional dictation errors.  MRN: [id number] DOB: [birth year]  Subjective:   Theresa [name] is a 52 y.o. female presenting for 1 day history of persistent right knee pain worst posteriorly but also to a lesser extent over the patellar tendon.  Has felt stiffness along her lower leg worse posteriorly.  Symptoms started she had planted her foot and started to turn to stand up.  Has known history of a significant meniscus tear in both knees but is prominent in the right knee.  This was diagnosed within the past year.  She does have an orthopedist that she can follow-up with.  She does report that they had postponed surgical repair of the meniscus.  No current facility-administered medications for this encounter.  Current Outpatient Medications:    amphetamine-dextroamphetamine (ADDERALL XR) 20 MG 24 hr capsule, , Disp: , Rfl:    dexlansoprazole (DEXILANT) 60 MG capsule, TAKE ONE CAPSULE BY MOUTH ONCE DAILY, Disp: 30 capsule, Rfl: 3   escitalopram (LEXAPRO) 20 MG tablet, Take 1 tablet (20 mg total) by mouth daily., Disp: 90 tablet, Rfl: 1   GARLIC PO, Take by mouth. Garlic, honey and cayanne-BID, Disp: , Rfl:    lisinopril-hydrochlorothiazide (ZESTORETIC) 20-25 MG tablet, Take 1 tablet by mouth daily., Disp: 30 tablet, Rfl: 3   ondansetron (ZOFRAN) 8 MG tablet, TAKE  (1)  TABLET  THREE TIMES DAILY AS NEEDED FOR NAUSEA., Disp: 30 tablet, Rfl: 1   potassium chloride (KLOR-CON M) 10 MEQ tablet, Take 1 tablet every day by oral route., Disp: , Rfl:    tirzepatide (ZEPBOUND) 5 MG/0.5ML Pen, Inject 5 mg into the skin once a week., Disp: 2 mL, Rfl: 2   TURMERIC PO, Take by mouth daily. Tea with ginger, Disp: , Rfl:    Allergies  Allergen Reactions   Fentanyl Other (See Comments)    Doesn't like the way it feels    Prednisone     Makes her feel bad, side effects    Past  Medical History:  Diagnosis Date   Anxiety    Depression    Fibromyalgia    GERD (gastroesophageal reflux disease)    Headache(784.0)    Heart murmur    Hyperplastic colon polyp 10/28/2019   Dr Kendell Bane recommends repeat colonoscopy in 2028.   IFG (impaired fasting glucose)    Obsessive-compulsive disorder    Osteoarthritis    Seizures (HCC)    last seizure at age 89-stressed induced seizure.     Past Surgical History:  Procedure Laterality Date   ABDOMINAL HYSTERECTOMY     still has right ovary   BIOPSY N/A 03/05/2014   Procedure: DUODENAL AND GASTRIC BIOPSY;  Surgeon: West Bali, MD;  Location: [facility];  Service: Endoscopy;  Laterality: N/A;   bladder sling X 3     CHILECTOMY Left 03/12/2020   Procedure: CHILECTOMY HALLUX;  Surgeon: Erskine Emery, DPM;  Location: [facility];  Service: Podiatry;  Laterality: Left;   CHOLECYSTECTOMY     COLONOSCOPY  04/24/2012   WUJ:[id number] polyp measuring 5 mm in size was found in the proximal transverse colon; polypectomy was performed with cold forceps Pedunculated polyp measuring 1.1 cm in size was found in the sigmoid colon; polypectomy was performed using snare cautery Moderate diverticulosis was noted in the descending colon and sigmoid colon small internal hemorrhoids. next tcs 04/2017   COLONOSCOPY WITH PROPOFOL N/A 10/23/2019   sigmoid and  descending colon diverticulosis, one 3 mm polyp in cecum, one 4 mm polyp in descending colon. Hyperplastic   ESOPHAGOGASTRODUODENOSCOPY N/A 02/18/2014   Procedure: ESOPHAGOGASTRODUODENOSCOPY (EGD);  Surgeon: West Bali, MD;  [facility];  Service: Endoscopy;  Laterality: N/A;  115   ESOPHAGOGASTRODUODENOSCOPY (EGD) WITH PROPOFOL N/A 03/05/2014   SLF: 1. dyspepsia due to constipation, gastritis, and GERD 2. Multiple Gastric polyps 3. Moderate non-erosive gastritis 4. Diverticulum in the 2nd part of the duodenum   ESOPHAGOGASTRODUODENOSCOPY (EGD) WITH PROPOFOL N/A 10/23/2019   normal  esophagus s/p dilation, normal stomach, normal duodenum.   MALONEY DILATION N/A 10/23/2019   Procedure: Elease Hashimoto DILATION;  Surgeon: Corbin Ade, MD;  [facility];  Service: Endoscopy;  Laterality: N/A;   POLYPECTOMY  10/23/2019   Procedure: POLYPECTOMY;  Surgeon: Corbin Ade, MD;  [facility];  Service: Endoscopy;;   TUBAL LIGATION      Family History  Problem Relation Age of Onset   Alcohol abuse Mother    Depression Mother    Anxiety disorder Mother    Paranoid behavior Mother    Hypertension Mother    Diabetes Mother    Breast cancer Mother    Alcohol abuse Father    Hypertension Father    Diabetes Father    Physical abuse Brother    Healthy Daughter    GER disease Son    Healthy Son    Colon cancer Neg Hx    ADD / ADHD Neg Hx    Bipolar disorder Neg Hx    Dementia Neg Hx    OCD Neg Hx    Drug abuse Neg Hx    Schizophrenia Neg Hx    Seizures Neg Hx    Sexual abuse Neg Hx     Social History   Tobacco Use   Smoking status: Never   Smokeless tobacco: Never  Vaping Use   Vaping Use: Never used  Substance Use Topics   Alcohol use: Yes    Comment: occas   Drug use: No    ROS   Objective:   Vitals: BP 94/64 (BP Location: Right Arm)   Pulse 78   Temp 97.9 F (36.6 C) (Oral)   Resp 18   SpO2 96%   Physical Exam Constitutional:      General: She is not in acute distress.    Appearance: Normal appearance. She is well-developed. She is not ill-appearing, toxic-appearing or diaphoretic.  HENT:     Head: Normocephalic and atraumatic.     Nose: Nose normal.     Mouth/Throat:     Mouth: Mucous membranes are moist.  Eyes:     General: No scleral icterus.       Right eye: No discharge.        Left eye: No discharge.     Extraocular Movements: Extraocular movements intact.  Cardiovascular:     Rate and Rhythm: Normal rate.  Pulmonary:     Effort: Pulmonary effort is normal.  Musculoskeletal:     Right knee: Bony  tenderness present. No swelling, deformity, erythema, ecchymosis, lacerations or crepitus. Decreased range of motion. Tenderness (over flexor surface of the posterior knee) present over the patellar tendon. No medial joint line or lateral joint line tenderness. Normal alignment and normal patellar mobility.  Skin:    General: Skin is warm and dry.  Neurological:     General: No focal deficit present.     Mental Status: She is alert and oriented  to person, place, and time.  Psychiatric:        Mood and Affect: Mood normal.        Behavior: Behavior normal.    DG Knee Complete 4 Views Right  Result Date: 10/15/2022 CLINICAL DATA:  Pain in the right knee with walking and bending. Felt something pop yesterday. EXAM: RIGHT KNEE - COMPLETE 4+ VIEW COMPARISON:  01/26/2022 FINDINGS: Mild degenerative changes in the right knee with medial greater than lateral compartment narrowing and small osteophyte formation. No evidence of acute fracture or dislocation. No focal bone lesion or bone destruction. Bone cortex appears intact. No significant effusion. IMPRESSION: Mild degenerative changes in the right knee. No acute bony abnormalities. Electronically Signed   By: Burman Nieves M.D.   On: 10/15/2022 16:37     Narrative & Impression  CLINICAL DATA:  Chronic knee pain, worsening over the last 2 months. Patient felt a pop when rolling over in the bed. No acute injury or prior relevant surgery.   EXAM: MRI OF THE RIGHT KNEE WITHOUT CONTRAST   TECHNIQUE: Multiplanar, multisequence MR imaging of the knee was performed. No intravenous contrast was administered.   COMPARISON:  Radiographs 01/26/2022   FINDINGS: MENISCI   Medial meniscus: Early root tear of the posterior horn of the medial meniscus with incomplete meniscal detachment. The medial meniscus is partially extruded peripherally from the joint. No centrally displaced meniscal fragments.   Lateral meniscus:  Intact with normal  morphology.   LIGAMENTS   Cruciates:  Intact.   Collaterals:  Intact.   CARTILAGE   Patellofemoral: Moderate patellofemoral degenerative changes with diffuse patellar chondral thinning and subchondral cyst formation in the lateral facet and apex.   Medial: Mild chondral thinning, surface irregularity and peripheral osteophyte formation.   Lateral:  Preserved.   MISCELLANEOUS   Joint:  Small to moderate knee joint effusion.   Popliteal Fossa: The popliteus muscle and tendon are intact. No significant Baker's cyst.   Extensor Mechanism:  Intact.   Bones:  No acute or significant extra-articular osseous findings.   Other: Mild prepatellar subcutaneous edema without focal fluid collection.   IMPRESSION: 1. Early root tear of the posterior horn of the medial meniscus with incomplete meniscal detachment. 2. The lateral meniscus, cruciate and collateral ligaments are intact. 3. Moderate patellofemoral and mild medial compartment degenerative changes. No acute osseous findings. 4. Small to moderate knee joint effusion.     Electronically Signed   By: Carey Bullocks M.D.   On: 02/01/2022 16:34   I applied a 4 inch Ace wrap to the right knee.  Assessment and Plan :   PDMP not reviewed this encounter.  1. Acute pain of right knee   2. Knee strain, right, initial encounter    Recommended conservative management for right knee strain.  Use RICE method, recommended celecoxib to avoid the GI side effects of NSAIDs.  Use famotidine in conjunction with the celecoxib.  Follow-up with the orthopedist soon as possible.  Counseled patient on potential for adverse effects with medications prescribed/recommended today, ER and return-to-clinic precautions discussed, patient verbalized understanding.    Wallis Bamberg, [place] 10/15/22 1717

## 2022-10-15 NOTE — ED Triage Notes (Signed)
Felt something pop in right knee while turning to stand up yesterday.  States knee gives out at times.

## 2022-10-16 IMAGING — DX DG FOOT COMPLETE 3+V*L*
3 series · 3 of 3 positions shown · non-contrast
Comparison: Left foot x-rays dated September 09, 2011.

CLINICAL DATA: Chronic first MTP joint pain.  No prior surgery.

EXAM:
LEFT FOOT - COMPLETE 3+ VIEW

[foot ap]
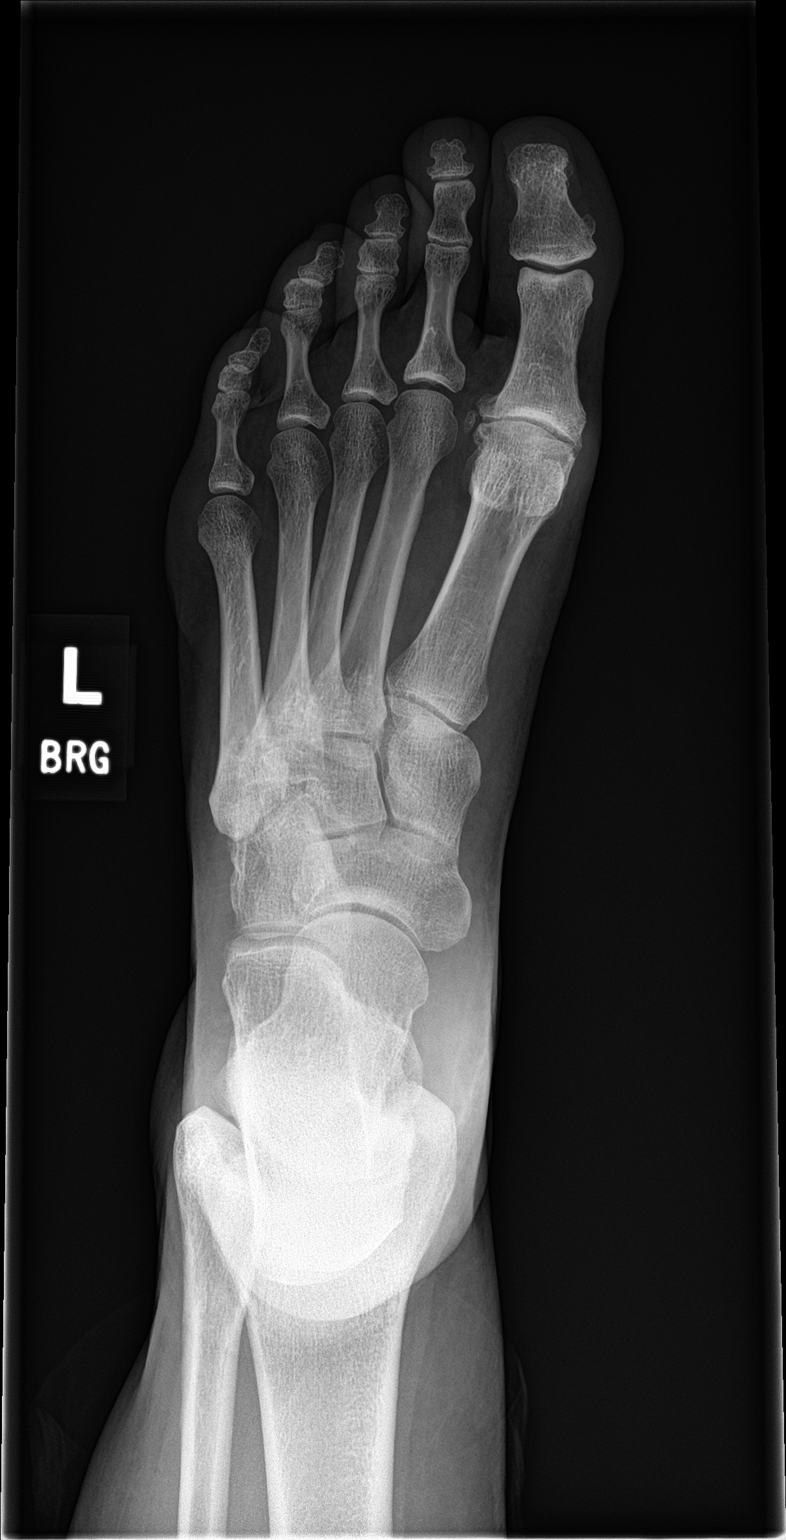

[foot obl]
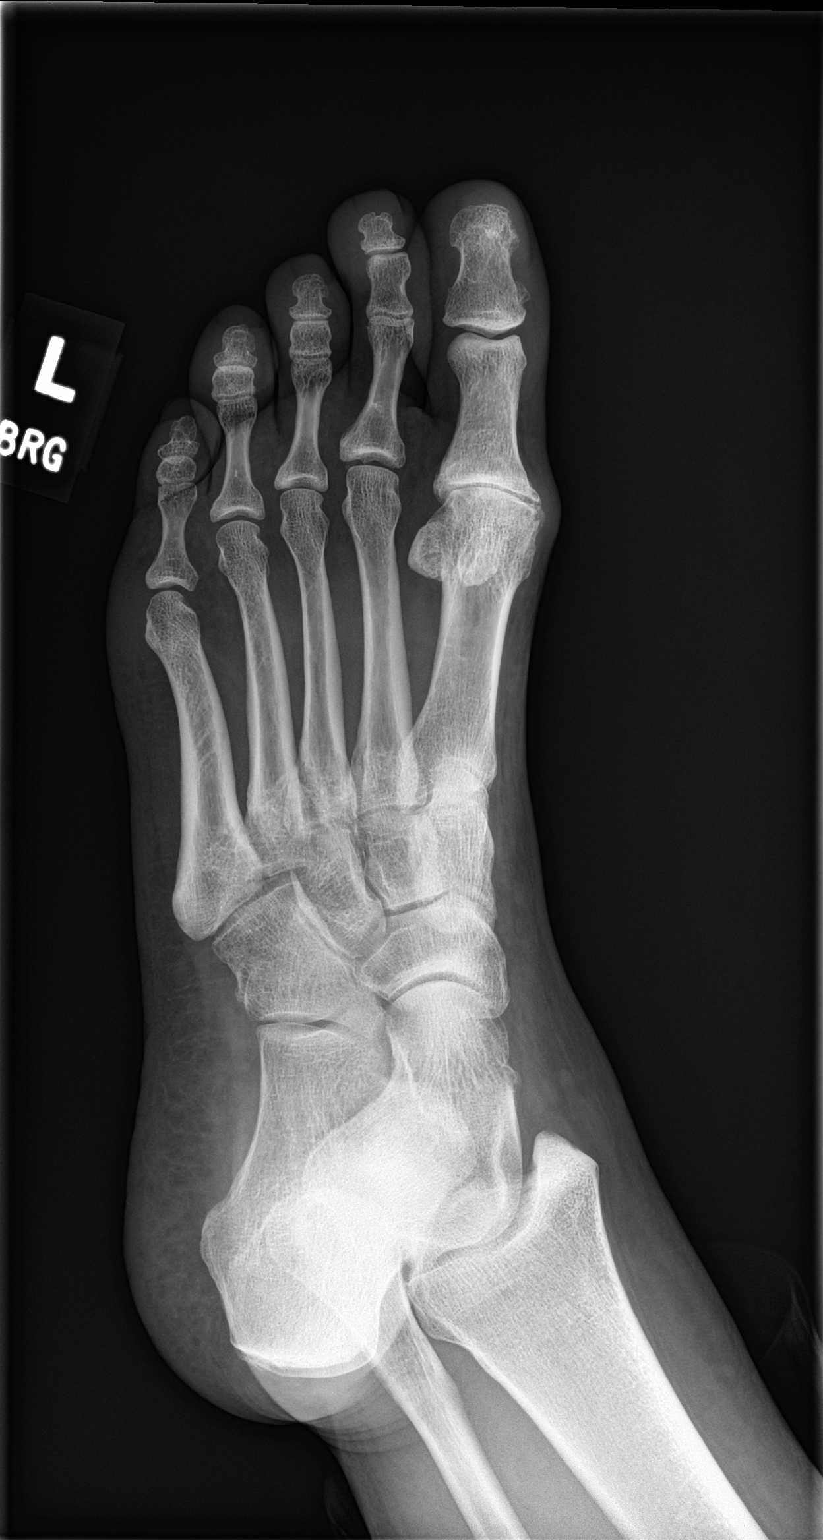

[foot lat]
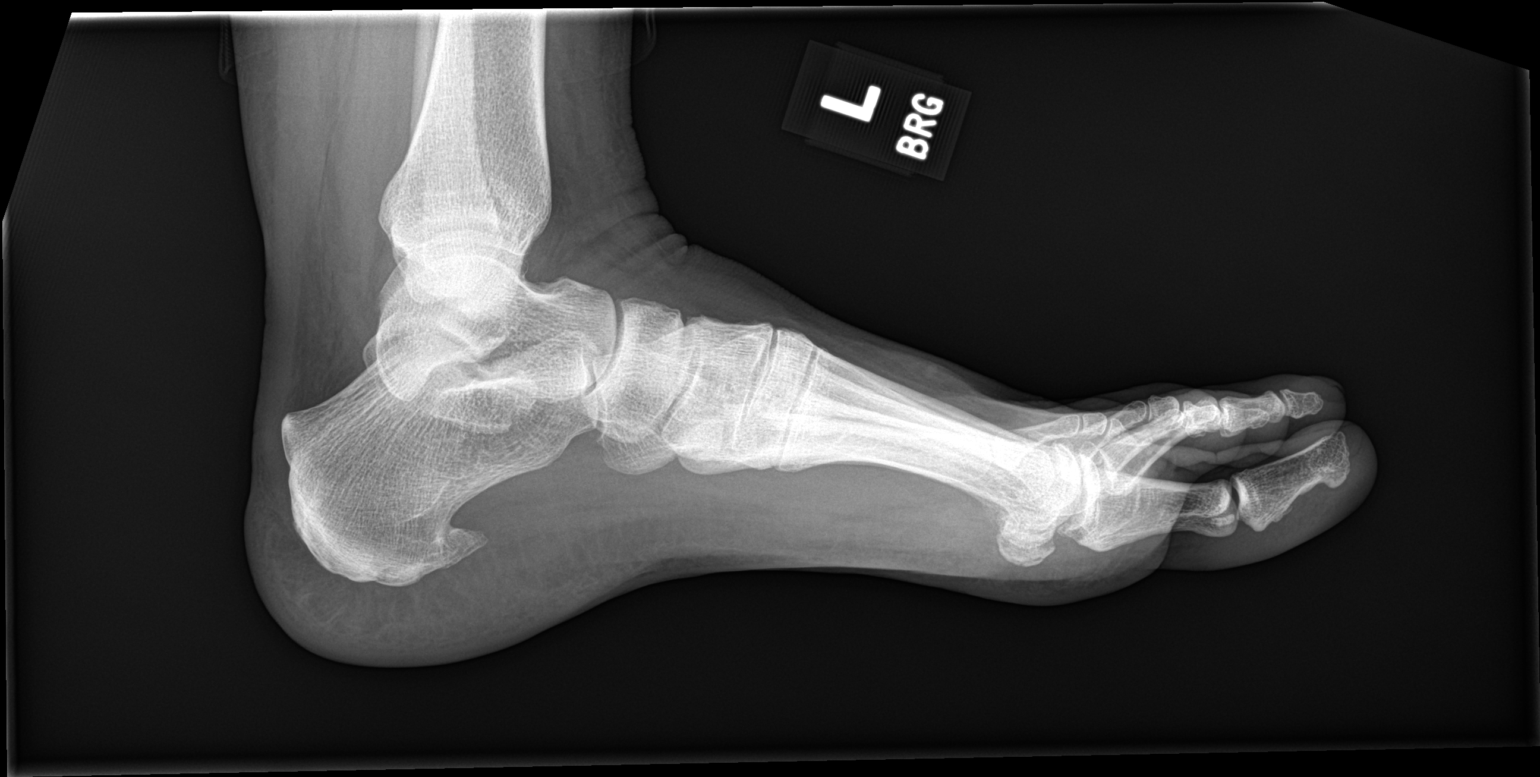

[3 of 3 positions shown; findings below may reference images not displayed]

FINDINGS: No acute fracture or dislocation. New mild to moderate first MTP
joint space narrowing with bulky marginal osteophytes laterally.
Remaining joint spaces are preserved. Bone mineralization is normal.
Unchanged large plantar enthesophyte. Soft tissues are unremarkable.
IMPRESSION: 1. New mild to moderate first MTP joint osteoarthritis.

## 2022-10-18 ENCOUNTER — Other Ambulatory Visit: Payer: Self-pay | Admitting: Family Medicine

## 2022-10-22 ENCOUNTER — Telehealth: Payer: Self-pay | Admitting: Family Medicine

## 2022-10-22 ENCOUNTER — Other Ambulatory Visit: Payer: Self-pay | Admitting: Family Medicine

## 2022-10-22 NOTE — Telephone Encounter (Signed)
Nurses I was sent a request for refill regarding Adderall. I do not see where patient has had a recent visit within the past couple months regarding Adderall. I would commend a follow-up office visit-she is seeing Eber Jones in the past more than likely would like to see Eber Jones again

## 2022-10-28 ENCOUNTER — Encounter: Payer: Self-pay | Admitting: Emergency Medicine

## 2022-10-28 ENCOUNTER — Other Ambulatory Visit: Payer: Self-pay

## 2022-10-28 ENCOUNTER — Ambulatory Visit
Admission: EM | Admit: 2022-10-28 | Discharge: 2022-10-28 | Disposition: A | Discharge status: Home / Self Care | Payer: BC Managed Care – PPO | Attending: Emergency Medicine | Admitting: Emergency Medicine

## 2022-10-28 ENCOUNTER — Ambulatory Visit (INDEPENDENT_AMBULATORY_CARE_PROVIDER_SITE_OTHER): Payer: BC Managed Care – PPO

## 2022-10-28 DIAGNOSIS — S29019A Strain of muscle and tendon of unspecified wall of thorax, initial encounter: Secondary | ICD-10-CM

## 2022-10-28 DIAGNOSIS — M62830 Muscle spasm of back: Secondary | ICD-10-CM

## 2022-10-28 DIAGNOSIS — R079 Chest pain, unspecified: Secondary | ICD-10-CM | POA: Diagnosis not present

## 2022-10-28 MED ORDER — TIZANIDINE HCL 4 MG PO TABS: 4.0000 mg | ORAL_TABLET | Freq: Three times a day (TID) | ORAL | 0 refills | Status: DC | PRN

## 2022-10-28 NOTE — Discharge Instructions (Addendum)
Your EKG has improved since your previous EKG in August 2021.  You are at low risk for this to be a heart attack in the next 30 days.  Your x-ray has not yet been read by radiology, but I did not see anything obvious on it.  We will contact you if the radiologist sees something abnormal on the x-ray that we need to address.  In the meantime, add 1000 mg of Tylenol to the Celebrex.  May take 1000 mg of Tylenol 3 times a day as needed for pain.  Zanaflex will help with muscle spasms.  Gentle stretching may also be helpful.  Go to the ER if you get worse, have difficulty breathing, or for any other concerns.

## 2022-10-28 NOTE — ED Triage Notes (Signed)
Back spasms in mid back since yesterday.  States walking around makes it worse.

## 2022-10-28 NOTE — ED Provider Notes (Signed)
HPI  SUBJECTIVE:  Theresa [name] is a 52 y.o. female who presents with intermittent thoracic back pain described as spasms starting on the right, which is now migrated to the left thoracic area and is going around to her left breast.  Symptoms started yesterday while bending forward.  It lasted seconds yesterday, but has now become constant today.  She mopped the floor 2 days ago, but denies other change in her physical activity.  No trauma to the chest, cough, wheeze, shortness of breath.  She reports some chest pressure.  No accompanying nausea, diaphoresis, radiation of this pain of her neck or down her arm.  No abdominal pain, fevers.  No calf pain, swelling, surgery in the past 4 weeks, recent immobilization, exogenous estrogen.  She tried TENS unit, Celebrex, lidocaine patch, tape.  Symptoms are better with lying flat, worse with exertion, torso rotation, lateral bending to the right.  She has a past medical history of hypertension, fibromyalgia, COVID with residual lung damage, and seizures in childhood.  No history of PE, DVT, cancer, hypercoagulability, diabetes, hypercholesterolemia, smoking, CVA, PAD/PVD, MI, coronary artery disease.  Family history positive for mother and father with MI in their 50s.  PCP: [facility].  Patient was started on celecoxib and famotidine on 6/14 for right knee pain.  Past Medical History:  Diagnosis Date   Anxiety    Depression    Fibromyalgia    GERD (gastroesophageal reflux disease)    Headache(784.0)    Heart murmur    Hyperplastic colon polyp 10/28/2019   Dr Kendell Bane recommends repeat colonoscopy in 2028.   IFG (impaired fasting glucose)    Obsessive-compulsive disorder    Osteoarthritis    Seizures (HCC)    last seizure at age 16-stressed induced seizure.    Past Surgical History:  Procedure Laterality Date   ABDOMINAL HYSTERECTOMY     still has right ovary   BIOPSY N/A 03/05/2014   Procedure: DUODENAL AND GASTRIC BIOPSY;   Surgeon: West Bali, MD;  Location: [facility];  Service: Endoscopy;  Laterality: N/A;   bladder sling X 3     CHILECTOMY Left 03/12/2020   Procedure: CHILECTOMY HALLUX;  Surgeon: Erskine Emery, DPM;  Location: [facility];  Service: Podiatry;  Laterality: Left;   CHOLECYSTECTOMY     COLONOSCOPY  04/24/2012   [id number] polyp measuring 5 mm in size was found in the proximal transverse colon; polypectomy was performed with cold forceps Pedunculated polyp measuring 1.1 cm in size was found in the sigmoid colon; polypectomy was performed using snare cautery Moderate diverticulosis was noted in the descending colon and sigmoid colon small internal hemorrhoids. next tcs 04/2017   COLONOSCOPY WITH PROPOFOL N/A 10/23/2019   sigmoid and descending colon diverticulosis, one 3 mm polyp in cecum, one 4 mm polyp in descending colon. Hyperplastic   ESOPHAGOGASTRODUODENOSCOPY N/A 02/18/2014   Procedure: ESOPHAGOGASTRODUODENOSCOPY (EGD);  Surgeon: West Bali, MD;  Location: [facility];  Service: Endoscopy;  Laterality: N/A;  115   ESOPHAGOGASTRODUODENOSCOPY (EGD) WITH PROPOFOL N/A 03/05/2014   SLF: 1. dyspepsia due to constipation, gastritis, and GERD 2. Multiple Gastric polyps 3. Moderate non-erosive gastritis 4. Diverticulum in the 2nd part of the duodenum   ESOPHAGOGASTRODUODENOSCOPY (EGD) WITH PROPOFOL N/A 10/23/2019   normal esophagus s/p dilation, normal stomach, normal duodenum.   MALONEY DILATION N/A 10/23/2019   Procedure: Elease Hashimoto DILATION;  Surgeon: Corbin Ade, MD;  Location: [facility];  Service: Endoscopy;  Laterality: N/A;   POLYPECTOMY  10/23/2019  Procedure: POLYPECTOMY;  Surgeon: Corbin Ade, MD;  Location: [facility];  Service: Endoscopy;;   TUBAL LIGATION      Family History  Problem Relation Age of Onset   Alcohol abuse Mother    Depression Mother    Anxiety disorder Mother    Paranoid behavior Mother    Hypertension Mother    Diabetes Mother    Breast  cancer Mother    Alcohol abuse Father    Hypertension Father    Diabetes Father    Physical abuse Brother    Healthy Daughter    GER disease Son    Healthy Son    Colon cancer Neg Hx    ADD / ADHD Neg Hx    Bipolar disorder Neg Hx    Dementia Neg Hx    OCD Neg Hx    Drug abuse Neg Hx    Schizophrenia Neg Hx    Seizures Neg Hx    Sexual abuse Neg Hx     Social History   Tobacco Use   Smoking status: Never   Smokeless tobacco: Never  Vaping Use   Vaping Use: Never used  Substance Use Topics   Alcohol use: Yes    Comment: occas   Drug use: No    No current facility-administered medications for this encounter.  Current Outpatient Medications:    tiZANidine (ZANAFLEX) 4 MG tablet, Take 1 tablet (4 mg total) by mouth every 8 (eight) hours as needed for muscle spasms., Disp: 30 tablet, Rfl: 0   amphetamine-dextroamphetamine (ADDERALL XR) 20 MG 24 hr capsule, , Disp: , Rfl:    celecoxib (CELEBREX) 200 MG capsule, Take 1 capsule (200 mg total) by mouth 2 (two) times daily., Disp: 30 capsule, Rfl: 0   dexlansoprazole (DEXILANT) 60 MG capsule, TAKE ONE CAPSULE BY MOUTH ONCE DAILY, Disp: 30 capsule, Rfl: 3   escitalopram (LEXAPRO) 20 MG tablet, Take 1 tablet (20 mg total) by mouth daily., Disp: 90 tablet, Rfl: 1   GARLIC PO, Take by mouth. Garlic, honey and cayanne-BID, Disp: , Rfl:    lisinopril-hydrochlorothiazide (ZESTORETIC) 20-25 MG tablet, Take 1 tablet by mouth daily., Disp: 30 tablet, Rfl: 3   ondansetron (ZOFRAN) 8 MG tablet, TAKE  (1)  TABLET  THREE TIMES DAILY AS NEEDED FOR NAUSEA., Disp: 30 tablet, Rfl: 1   potassium chloride (KLOR-CON M) 10 MEQ tablet, Take 1 tablet every day by oral route., Disp: , Rfl:    tirzepatide (ZEPBOUND) 5 MG/0.5ML Pen, Inject 5 mg into the skin once a week., Disp: 2 mL, Rfl: 2   TURMERIC PO, Take by mouth daily. Tea with ginger, Disp: , Rfl:   Allergies  Allergen Reactions   Fentanyl Other (See Comments)    Doesn't like the way it feels     Prednisone     Makes her feel bad, side effects     ROS  As noted in HPI.   Physical Exam  BP 118/85 (BP Location: Right Arm)   Pulse 72   Temp 98.7 F (37.1 C) (Oral)   Resp 18   SpO2 98%   Constitutional: Well developed, well nourished, no acute distress Eyes:  EOMI, conjunctiva normal bilaterally HENT: Normocephalic, atraumatic,mucus membranes moist Respiratory: Normal inspiratory effort, lungs clear bilaterally. bilateral reproducible mid thoracic tenderness wrapping around to the tail of the left breast.  Right trapezius tenderness, spasm.  Positive right-sided chest wall tenderness.   Cardiovascular: Normal rate, regular rhythm, no murmurs rubs or gallops GI: nondistended.  skin: No rash, skin intact Musculoskeletal: Calves symmetric, nontender, no edema Neurologic: Alert & oriented x 3, no focal neuro deficits Psychiatric: Speech and behavior appropriate   ED Course   Medications - No data to display  Orders Placed This Encounter  Procedures   DG Chest 2 View    Standing Status:   Standing    Number of Occurrences:   1    Order Specific Question:   Reason for Exam (SYMPTOM  OR DIAGNOSIS REQUIRED)    Answer:   Midthoracic chest pain worse on the left to rule out pneumothorax, pneumonia, pleural effusion   ED EKG    Standing Status:   Standing    Number of Occurrences:   1    Order Specific Question:   Reason for Exam    Answer:   Chest Pain   EKG 12-Lead    Standing Status:   Standing    Number of Occurrences:   1   EKG 12-Lead    Standing Status:   Standing    Number of Occurrences:   1    No results found for this or any previous visit (from the past 24 hour(s)). DG Chest 2 View  Result Date: 10/28/2022 CLINICAL DATA:  Mid thoracic chest pain EXAM: CHEST - 2 VIEW COMPARISON:  10/20/2020 FINDINGS: The heart size and mediastinal contours are within normal limits. Both lungs are clear. The visualized skeletal structures are unremarkable. IMPRESSION:  No active cardiopulmonary disease. Electronically Signed   By: Charlett Nose M.D.   On: 10/28/2022 15:01    ED Clinical Impression  1. Thoracic myofascial strain, initial encounter   2. Spasm of thoracic back muscle     ED Assessment/Plan     Previous records reviewed.  As noted in HPI.  Presents with bilateral thoracic pain starting in the right, wrapping around to the left.  Will check EKG, chest x-ray.  In the differential is musculoskeletal chest pain, PE, pneumothorax, pneumonia, pleural effusion, ACS.  Doubt shingles as it is bilateral.  EKG: Normal sinus rhythm, rate 72.  Left axis deviation.  No hypertrophy.  No ST-T wave elevation.  T wave inversion in 3 from EKG done in 2021 resolved.  No other changes compared to EKG from 12/2019.  Patient is symptomatic while EKG was obtained.  HEART score:   History: Slightly suspicious 0 EKG: Normal 0 Age: 47-60 4+1 Risk factors: BMI above 30, hypertension, family history +2 Troponin: Not available  Total +3 patient is at low risk for 30-day MACE.  Discussed this with patient.  Cannot use PERC criteria due to age.  Wells score 0.  The patient is at low risk for PE.  Seems to be very musculoskeletal as it is reproducible and aggravated with movement.  Reviewed imaging independently.  No pneumonia, pneumothorax, normal mediastinum.  No acute cardiopulmonary disease.  See radiology report for details.  EKG, chest x-ray reassuring.  Will have her continue the Celebrex, add Tylenol to this.  Patient is unable to tolerate the side effects of prednisone, so we will send home with Zanaflex.  Follow-up with PCP as needed.  Strict ER return precautions given.  Discussed EKG,, imaging, MDM, treatment plan, and plan for follow-up with patient. Discussed sn/sx that should prompt return to the ED. patient agrees with plan.   Meds ordered this encounter  Medications   tiZANidine (ZANAFLEX) 4 MG tablet    Sig: Take 1 tablet (4 mg total) by mouth  every 8 (eight) hours as  needed for muscle spasms.    Dispense:  30 tablet    Refill:  0    *This clinic note was created using Scientist, clinical (histocompatibility and immunogenetics). Therefore, there may be occasional mistakes despite careful proofreading.  ?     Domenick Gong, MD 10/29/22 1031

## 2022-11-01 ENCOUNTER — Other Ambulatory Visit: Payer: Self-pay | Admitting: Nurse Practitioner

## 2022-11-01 MED ORDER — TIRZEPATIDE-WEIGHT MANAGEMENT 7.5 MG/0.5ML ~~LOC~~ SOAJ: 7.5000 mg | SUBCUTANEOUS | 0 refills | Status: DC

## 2022-11-12 ENCOUNTER — Ambulatory Visit: Payer: BC Managed Care – PPO | Admitting: Nurse Practitioner

## 2022-11-12 ENCOUNTER — Encounter: Payer: Self-pay | Admitting: Nurse Practitioner

## 2022-11-12 DIAGNOSIS — R7301 Impaired fasting glucose: Secondary | ICD-10-CM | POA: Diagnosis not present

## 2022-11-12 DIAGNOSIS — E876 Hypokalemia: Secondary | ICD-10-CM | POA: Diagnosis not present

## 2022-11-12 DIAGNOSIS — F419 Anxiety disorder, unspecified: Secondary | ICD-10-CM

## 2022-11-12 DIAGNOSIS — R748 Abnormal levels of other serum enzymes: Secondary | ICD-10-CM

## 2022-11-12 MED ORDER — TIRZEPATIDE-WEIGHT MANAGEMENT 7.5 MG/0.5ML ~~LOC~~ SOAJ: 7.5000 mg | SUBCUTANEOUS | 2 refills | Status: DC

## 2022-11-12 MED ORDER — CLONAZEPAM 0.5 MG PO TABS: ORAL_TABLET | ORAL | 0 refills | Status: DC

## 2022-11-12 NOTE — Progress Notes (Signed)
Subjective:    Patient ID: Theresa [name], female    DOB: 11-18-1970, 52 y.o.   MRN: [id number]   HPI: Presents for recheck on her weight.  Currently on tirzepatide 7.5 mg weekly.  Slight nausea only with certain foods.  No abdominal pain.  No vomiting.  Some sulfuric burping.  Mild constipation relieved with MiraLAX.  Patient states the problem is not significant enough to stop medication. Requesting recheck of her metabolic profile to recheck her potassium and sodium levels. Staying very active.  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 Struggling lately with some family issues with her son which is caused her a great deal of anxiety and stress.  Taking escitalopram 20 mg daily.  Requesting a prescription for her to take as needed for extreme symptoms.  Completed some leftover diazepam that she had from several months ago.    11/12/2022    4:23 PM  Depression screen PHQ 2/9  Decreased Interest 1  Down, Depressed, Hopeless 2  PHQ - 2 Score 3  Altered sleeping 2  Tired, decreased energy 3  Change in appetite 0  Feeling bad or failure about yourself  0  Trouble concentrating 3  Moving slowly or fidgety/restless 0  Suicidal thoughts 0  PHQ-9 Score 11  Difficult doing work/chores Somewhat difficult      11/12/2022    4:23 PM 07/16/2022    4:24 PM 06/07/2022    9:14 AM 04/12/2022    8:52 AM  GAD 7 : Generalized Anxiety Score  Nervous, Anxious, on Edge 1 0 2 3  Control/stop worrying 2 0 3 3  Worry too much - different things 2 0 3 3  Trouble relaxing 2 1 2 3   Restless 0 0 1 2  Easily annoyed or irritable 2 1 2 3   Afraid - awful might happen 0 1 2 1   Total GAD 7 Score 9 3 15 18   Anxiety Difficulty Somewhat difficult Not difficult at all       Review of Systems  Constitutional:  Positive for fatigue.  Respiratory:  Negative for cough, chest tightness and shortness of breath.   Cardiovascular:  Negative for chest pain.  Gastrointestinal:   Positive for constipation and nausea. Negative for abdominal pain, diarrhea and vomiting.       Objective:   Physical Exam NAD.  Alert, oriented.  Lungs clear.  Heart regular rate rhythm.  Abdomen soft nondistended nontender. Today's Vitals   11/12/22 1546  BP: 115/81  Pulse: 72  Temp: 97.7 F (36.5 C)  SpO2: 97%  Weight: 214 lb 3.2 oz (97.2 kg)  Height: 5\' 5"  (1.651 m)   Body mass index is 35.64 kg/m. 11 pounds since her last visit.       Assessment & Plan:   Problem List Items Addressed This Visit       Endocrine   IFG (impaired fasting glucose)     Other   Anxiety   Hypokalemia   Relevant Orders   Comprehensive metabolic panel   Morbid obesity (HCC) - Primary   Relevant Medications   tirzepatide (ZEPBOUND) 7.5 MG/0.5ML Pen   Other Visit Diagnoses     Elevated liver enzymes       Relevant Orders   Comprehensive metabolic panel      Lab pending. Continue to work on [employer] and regular activity.  Continues at pounds as directed at current dose. Continue Lexapro as directed.  Prescribed Klonopin low-dose to take for  extreme anxiety as directed, use sparingly. Return in about 3 months (around 02/12/2023).

## 2022-11-16 ENCOUNTER — Ambulatory Visit (INDEPENDENT_AMBULATORY_CARE_PROVIDER_SITE_OTHER): Payer: BC Managed Care – PPO | Admitting: Physician Assistant

## 2022-11-16 ENCOUNTER — Encounter (INDEPENDENT_AMBULATORY_CARE_PROVIDER_SITE_OTHER): Payer: Self-pay | Admitting: Physician Assistant

## 2022-11-16 VITALS — BP 106/74 | HR 73 | Temp 97.7°F | Ht 65.0 in | Wt 210.0 lb

## 2022-11-16 DIAGNOSIS — Z0289 Encounter for other administrative examinations: Secondary | ICD-10-CM

## 2022-11-16 DIAGNOSIS — R7303 Prediabetes: Secondary | ICD-10-CM | POA: Diagnosis not present

## 2022-11-16 DIAGNOSIS — E66812 Obesity, class 2: Secondary | ICD-10-CM

## 2022-11-16 DIAGNOSIS — I10 Essential (primary) hypertension: Secondary | ICD-10-CM

## 2022-11-16 DIAGNOSIS — Z6834 Body mass index (BMI) 34.0-34.9, adult: Secondary | ICD-10-CM

## 2022-11-16 DIAGNOSIS — R7401 Elevation of levels of liver transaminase levels: Secondary | ICD-10-CM | POA: Diagnosis not present

## 2022-11-16 NOTE — Progress Notes (Signed)
Office: [phone]  /  Fax: [phone]   Initial Visit  Theresa [name] was seen in clinic today to evaluate for obesity. She is interested in losing weight to improve overall health and reduce the risk of weight related complications. She presents today to review program treatment options, initial physical assessment, and evaluation.     She was referred by: PCP- Started on Zepbound by PCP several months ago and is anxious to be on nutrition plan to help support her weight loss efforts.   When asked what else they would like to accomplish? She states: Adopt healthier eating patterns, Improve energy levels and physical activity, Improve existing medical conditions, Reduce number of medications, Reduce risk for a surgery, Improve quality of life, and Improve appearance  Weight history: Gained with pregnancy and never lost . Has goal weight of 175 lbs.   When asked how has your weight affected you? She states: Contributed to medical problems, Contributed to orthopedic problems or mobility issues, Having fatigue, Having poor endurance, and Other: Problems with skipping meals  Some associated conditions: Hypertension, Arthritis:Torn knee meniscus, Fatty liver disease, Prediabetes, GERD, and Overactive bladder  Contributing factors: Family history, Nutritional, Reduced physical activity, and Pregnancy  Weight promoting medications identified: Psychotropic medications  Current nutrition plan: Low-carb  Current level of physical activity: Limited due to chronic pain or orthopedic problems and Walking  Current or previous pharmacotherapy: GLP-1 + GIP Zepbound 7.5 mg weekly currently  Response to medication:  Started Zepbound 08/15/2022. Has lost 13 lbs thus far but is not following specific diet plan. Some skipping meals.    Past medical history includes:   Past Medical History:  Diagnosis Date   Anxiety    Depression    Fibromyalgia    GERD (gastroesophageal reflux disease)     Headache(784.0)    Heart murmur    Hyperplastic colon polyp 10/28/2019   Dr Kendell Bane recommends repeat colonoscopy in 2028.   IFG (impaired fasting glucose)    Obsessive-compulsive disorder    Osteoarthritis    Seizures (HCC)    last seizure at age 16-stressed induced seizure.     Objective:   BP 106/74   Pulse 73   Temp 97.7 F (36.5 C)   Ht 5\' 5"  (1.651 m)   Wt 210 lb (95.3 kg)   SpO2 98%   BMI 34.95 kg/m  She was weighed on the bioimpedance scale: Body mass index is 34.95 kg/m.  Peak Weight:247 lbs , Body Fat%:43.7%, Visceral Fat Rating:11, Weight trend over the last 12 months: Decreasing  General:  Alert, oriented and cooperative. Patient is in no acute distress.  Respiratory: Normal respiratory effort, no problems with respiration noted   Gait: able to ambulate independently  Mental Status: Normal mood and affect. Normal behavior. Normal judgment and thought content.   DIAGNOSTIC DATA REVIEWED:  BMET    Component Value Date/Time   NA 144 06/15/2022 1621   K 4.3 06/15/2022 1621   CL 107 (H) 06/15/2022 1621   CO2 25 06/15/2022 1621   GLUCOSE 92 06/15/2022 1621   GLUCOSE 157 (H) 12/28/2019 0438   BUN 10 06/15/2022 1621   CREATININE 0.73 06/15/2022 1621   CREATININE 0.80 10/21/2015 1256   CALCIUM 9.6 06/15/2022 1621   GFRNONAA 103 01/14/2020 0905   GFRAA 119 01/14/2020 0905   Lab Results  Component Value Date   HGBA1C 6.0 (H) 04/12/2022   HGBA1C 5.7 (H) 09/18/2012   No results found for: "INSULIN" CBC    Component Value  Date/Time   WBC 5.9 06/15/2022 1621   WBC 9.5 12/28/2019 0438   RBC 4.12 06/15/2022 1621   RBC 4.12 12/28/2019 0438   HGB 12.8 06/15/2022 1621   HCT 37.5 06/15/2022 1621   PLT 241 06/15/2022 1621   MCV 91 06/15/2022 1621   MCH 31.1 06/15/2022 1621   MCH 30.6 12/28/2019 0438   MCHC 34.1 06/15/2022 1621   MCHC 34.3 12/28/2019 0438   RDW 12.7 06/15/2022 1621   Iron/TIBC/Ferritin/ %Sat    Component Value Date/Time   FERRITIN 174  12/22/2019 0529   FERRITIN 93 12/20/2018 1057   Lipid Panel     Component Value Date/Time   CHOL 183 04/12/2022 0933   TRIG 136 04/12/2022 0933   HDL 59 04/12/2022 0933   CHOLHDL 3.1 04/12/2022 0933   CHOLHDL 2.9 10/21/2015 1256   VLDL 20 10/21/2015 1256   LDLCALC 100 (H) 04/12/2022 0933   Hepatic Function Panel     Component Value Date/Time   PROT 6.7 06/15/2022 1621   ALBUMIN 4.3 06/15/2022 1621   AST 27 06/15/2022 1621   ALT 37 (H) 06/15/2022 1621   ALKPHOS 80 06/15/2022 1621   BILITOT 0.6 06/15/2022 1621   BILIDIR 0.23 05/01/2020 1011      Component Value Date/Time   TSH 1.370 04/12/2022 0933     Assessment and Plan:   Essential hypertension, benign  Prediabetes  Elevated liver transaminase level  Class 2 severe obesity due to excess calories with serious comorbidity and body mass index (BMI) of 35.0 to 35.9 in adult [facility]        Obesity Treatment / Action Plan:  Patient will work on garnering support from family and friends to begin weight loss journey. Will work on eliminating or reducing the presence of highly palatable, calorie dense foods in the home. Will complete provided nutritional and psychosocial assessment questionnaire before the next appointment. Will be scheduled for indirect calorimetry to determine resting energy expenditure in a fasting state.  This will allow [place] to create a reduced calorie, high-protein meal plan to promote loss of fat mass while preserving muscle mass. Will work on reducing intake of added sugars, simple sugars and processed carbs. Will avoid skipping meals which may result in increased hunger signals and overeating at certain times. Will work on managing stress via relaxation methods as this may result in unhealthy eating patterns. Counseled on the health benefits of losing 5%-15% of total body weight. Was counseled on nutritional approaches to weight loss and benefits of reducing processed foods and consuming  plant-based foods and high quality protein as part of nutritional weight management. Was counseled on pharmacotherapy and role as an adjunct in weight management.   Obesity Education Performed Today:  She was weighed on the bioimpedance scale and results were discussed and documented in the synopsis.  We discussed obesity as a disease and the importance of a more detailed evaluation of all the factors contributing to the disease.  We discussed the importance of long term lifestyle changes which include nutrition, exercise and behavioral modifications as well as the importance of customizing this to her specific health and social needs.  We discussed the benefits of reaching a healthier weight to alleviate the symptoms of existing conditions and reduce the risks of the biomechanical, metabolic and psychological effects of obesity.  KALEEA PENNER appears to be in the action stage of change and states they are ready to start intensive lifestyle modifications and behavioral modifications.  30 minutes was spent  today on this visit including the above counseling, pre-visit chart review, and post-visit documentation.  Reviewed by clinician on day of visit: allergies, medications, problem list, medical history, surgical history, family history, social history, and previous encounter notes pertinent to obesity diagnosis.   Anjelica Gorniak,PA-C

## 2022-11-30 ENCOUNTER — Other Ambulatory Visit: Payer: Self-pay | Admitting: Gastroenterology

## 2022-12-03 ENCOUNTER — Telehealth: Payer: Self-pay | Admitting: Family Medicine

## 2022-12-03 NOTE — Telephone Encounter (Signed)
Patient requesting refill on albuterol due to the heat, she states sometimes hard to breathe . [address] [place]

## 2022-12-04 ENCOUNTER — Other Ambulatory Visit: Payer: Self-pay | Admitting: Nurse Practitioner

## 2022-12-04 MED ORDER — ALBUTEROL SULFATE HFA 108 (90 BASE) MCG/ACT IN AERS: 2.0000 | INHALATION_SPRAY | Freq: Four times a day (QID) | RESPIRATORY_TRACT | 0 refills | Status: DC | PRN

## 2022-12-04 NOTE — Telephone Encounter (Signed)
Done

## 2022-12-07 ENCOUNTER — Encounter: Payer: Self-pay | Admitting: Nurse Practitioner

## 2022-12-10 ENCOUNTER — Other Ambulatory Visit: Payer: Self-pay | Admitting: Nurse Practitioner

## 2022-12-10 ENCOUNTER — Ambulatory Visit: Admission: RE | Admit: 2022-12-10 | Payer: BC Managed Care – PPO | Source: Ambulatory Visit

## 2022-12-10 DIAGNOSIS — Z1231 Encounter for screening mammogram for malignant neoplasm of breast: Secondary | ICD-10-CM | POA: Diagnosis not present

## 2022-12-10 MED ORDER — ZEPBOUND 10 MG/0.5ML ~~LOC~~ SOAJ: 10.0000 mg | SUBCUTANEOUS | 2 refills | Status: DC

## 2022-12-14 ENCOUNTER — Ambulatory Visit (INDEPENDENT_AMBULATORY_CARE_PROVIDER_SITE_OTHER): Payer: BC Managed Care – PPO | Admitting: Family Medicine

## 2022-12-14 ENCOUNTER — Encounter (INDEPENDENT_AMBULATORY_CARE_PROVIDER_SITE_OTHER): Payer: Self-pay | Admitting: Family Medicine

## 2022-12-14 VITALS — BP 100/69 | HR 68 | Temp 97.7°F | Ht 65.0 in | Wt 201.0 lb

## 2022-12-14 DIAGNOSIS — F32A Depression, unspecified: Secondary | ICD-10-CM | POA: Diagnosis not present

## 2022-12-14 DIAGNOSIS — E559 Vitamin D deficiency, unspecified: Secondary | ICD-10-CM

## 2022-12-14 DIAGNOSIS — I158 Other secondary hypertension: Secondary | ICD-10-CM

## 2022-12-14 DIAGNOSIS — Z1331 Encounter for screening for depression: Secondary | ICD-10-CM

## 2022-12-14 DIAGNOSIS — R7303 Prediabetes: Secondary | ICD-10-CM | POA: Diagnosis not present

## 2022-12-14 DIAGNOSIS — R7401 Elevation of levels of liver transaminase levels: Secondary | ICD-10-CM

## 2022-12-14 DIAGNOSIS — R0602 Shortness of breath: Secondary | ICD-10-CM | POA: Diagnosis not present

## 2022-12-14 DIAGNOSIS — E669 Obesity, unspecified: Secondary | ICD-10-CM

## 2022-12-14 DIAGNOSIS — F411 Generalized anxiety disorder: Secondary | ICD-10-CM

## 2022-12-14 DIAGNOSIS — R5383 Other fatigue: Secondary | ICD-10-CM

## 2022-12-14 DIAGNOSIS — E668 Other obesity: Secondary | ICD-10-CM

## 2022-12-14 DIAGNOSIS — Z6833 Body mass index (BMI) 33.0-33.9, adult: Secondary | ICD-10-CM

## 2022-12-14 NOTE — Progress Notes (Signed)
Chief Complaint:   OBESITY Theresa [name] (MR# [id number]) is a 52 y.o. female who presents for evaluation and treatment of obesity and related comorbidities. Current BMI is Body mass index is 33.45 kg/m. Presly has been struggling with her weight for many years and has been unsuccessful in either losing weight, maintaining weight loss, or reaching her healthy weight goal.  Theresa [name] is currently in the action stage of change and ready to dedicate time achieving and maintaining a healthier weight. Theresa [name] is interested in becoming our patient and working on intensive lifestyle modifications including (but not limited to) diet and exercise for weight loss.  Patient asked doctor for referral here.  She had her information session with Shawn. She is living alone but has two children and one grandchild.  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  Works 40 hours a week 8:15-5:15 at that job and does side work in Journalist, newspaper.  Desired weight of 170lb; last time she was that weight was at age 65.  Previously lost weight with [employer] and Atkins.  Feels like she did best on Atkins because she was able to eat even when stress eating. Eats out most meals for lunch and stops eating after 6. Kitchen is not functioning right now but that should change in the next 2 months. Skips supper most weekdays and on the weekend she may not eat most of the weekend.   Food Recall: Waffle made out of cheese (handful), egg (2) and sausage (handful) and she eats 1-1.5 or she may eat a sausage egg and cheese on english muffin (feels full- can't eat all).  Next time she eats is around 10am and it may be grapes, oranges or apples.  She may also do wheat thins or mozzarella cheese.  Lunch will be rotisserie chicken breast meat and occasionally she has a salad and raspberry vinegarette dressing that she dips her salad into (feels satisfied).  She may eat again with her kids- atkins meal like buffalo chicken wrap or zucchini  boats.  Last night she had half a chicken breast and fried squash.  Halia's habits were reviewed today and are as follows: Her family eats meals together, her desired weight loss is 31 lbs, she has been heavy most of her life, she started gaining weight at 52 years old, her heaviest weight ever was 247 pounds, she has significant food cravings issues, she skips meals frequently, she is frequently drinking liquids with calories, she frequently makes poor food choices, she has problems with excessive hunger, she frequently eats larger portions than normal, and she struggles with emotional eating.  Depression Screen Emori's Food and Mood (modified PHQ-9) score was 9.  Subjective:   1. Other fatigue Theresa [name] admits to daytime somnolence and admits to waking up still tired. Patient has a history of symptoms of daytime fatigue, morning fatigue, and morning headache. Theresa [name] generally gets 6 hours of sleep per night, and states that she has nightime awakenings. Snoring is present. Apneic episodes are not present. Epworth Sleepiness Score is 5. EKG done at the end of June 2024; normal sinus rhythm at 72 BPM, abnormal QRS complex in V1, V2, and V6.  2. SOBOE (shortness of breath on exertion) Theresa [name] notes increasing shortness of breath with exercising and seems to be worsening over time with weight gain. She notes getting out of breath sooner with activity than she used to. This has not gotten worse recently. Theresa [name] denies shortness of breath at  rest or orthopnea.  3. Prediabetes Patient reports diagnosis for years. She is now on Zepbound. She has tried numerous different "diets" in the past.   4. Elevated liver transaminase level Patient's last LFTs improved. She notes minimal alcohol intake. LFTs elevated and labile for years.   5. Other secondary hypertension Patient is on lisinopril and hydrochlorothiazide 20-25 mg. She has tried getting herself off medications repeatedly. Stress induced. She denies  chest pain, chest pressure, or headache.   6. Vitamin D deficiency Patient is not on Vitamin D, and she notes fatigue.   7. GAD (generalized anxiety disorder) Patient is on Lexapro and Clonazepam. She is not in therapy. Her symptoms are normally well managed.   Assessment/Plan:   1. Other fatigue Tiffani does feel that her weight is causing her energy to be lower than it should be. Fatigue may be related to obesity, depression or many other causes. Labs will be ordered, and in the meanwhile, Theresa [name] will focus on self care including making healthy food choices, increasing physical activity and focusing on stress reduction.  - Vitamin B12 - Folate - TSH - T4, free - T3  2. SOBOE (shortness of breath on exertion) Theresa [name] does feel that she gets out of breath more easily that she used to when she exercises. Theresa [name]'s shortness of breath appears to be obesity related and exercise induced. She has agreed to work on weight loss and gradually increase exercise to treat her exercise induced shortness of breath. Will continue to monitor closely.  - CBC with Differential/Platelet  3. Prediabetes We will check labs today, and we will follow-up at patient's next appointment.   - Hemoglobin A1c - Insulin, random  4. Elevated liver transaminase level We will check labs today, and we will follow-up at patient's next appointment.   - Lipid Panel With LDL/HDL Ratio  5. Other secondary hypertension We will follow-up on patient's blood pressure at her next sequential appointment.   6. Vitamin D deficiency We will check labs today, and we will follow-up at patient's next appointment.   - VITAMIN D 25 Hydroxy (Vit-D Deficiency, Fractures)  7. GAD (generalized anxiety disorder) Patient was encouraged to continue Lexapro and Clonazepam, and she was encouraged to start therapy.   8. Depression screening Theresa [name] had a positive depression screening. Depression is commonly associated with obesity and  often results in emotional eating behaviors. We will monitor this closely and work on CBT to help improve the non-hunger eating patterns. Referral to Psychology may be required if no improvement is seen as she continues in our clinic.  9. Class 1 obesity with serious comorbidity and body mass index (BMI) of 33.0 to 33.9 in adult, unspecified obesity type Theresa [name] is currently in the action stage of change and her goal is to continue with weight loss efforts. I recommend Theresa [name] begin the structured treatment plan as follows:  She has agreed to the Category 2 Plan.  Exercise goals: No exercise has been prescribed at this time.   Behavioral modification strategies: increasing lean protein intake, meal planning and cooking strategies, keeping healthy foods in the home, and planning for success.  She was informed of the importance of frequent follow-up visits to maximize her success with intensive lifestyle modifications for her multiple health conditions. She was informed we would discuss her lab results at her next visit unless there is a critical issue that needs to be addressed sooner. Theresa [name] agreed to keep her next visit at the agreed upon time to discuss these results.  Objective:   Blood pressure 100/69, pulse 68, temperature 97.7 F (36.5 C), height 5\' 5"  (1.651 m), weight 201 lb (91.2 kg), SpO2 99%. Body mass index is 33.45 kg/m.  EKG: Normal sinus rhythm, rate 70 BPM.  Indirect Calorimeter completed today shows a VO2 of 226 and a REE of 1555.  Her calculated basal metabolic rate is 9147 thus her basal metabolic rate is worse than expected.  General: Cooperative, alert, well developed, in no acute distress. HEENT: Conjunctivae and lids unremarkable. Cardiovascular: Regular rhythm.  Lungs: Normal work of breathing. Neurologic: No focal deficits.   Lab Results  Component Value Date   CREATININE 0.73 06/15/2022   BUN 10 06/15/2022   NA 144 06/15/2022   K 4.3 06/15/2022   CL 107 (H)  06/15/2022   CO2 25 06/15/2022   Lab Results  Component Value Date   ALT 37 (H) 06/15/2022   AST 27 06/15/2022   ALKPHOS 80 06/15/2022   BILITOT 0.6 06/15/2022   Lab Results  Component Value Date   HGBA1C 5.4 12/14/2022   HGBA1C 6.0 (H) 04/12/2022   HGBA1C 6.0 (H) 02/27/2021   HGBA1C 5.8 (H) 05/01/2020   HGBA1C 5.8 (H) 12/19/2019   Lab Results  Component Value Date   INSULIN 15.8 12/14/2022   Lab Results  Component Value Date   TSH 0.891 12/14/2022   Lab Results  Component Value Date   CHOL 175 12/14/2022   HDL 57 12/14/2022   LDLCALC 103 (H) 12/14/2022   TRIG 79 12/14/2022   CHOLHDL 3.1 04/12/2022   Lab Results  Component Value Date   WBC 5.0 12/14/2022   HGB 12.2 12/14/2022   HCT 37.0 12/14/2022   MCV 93 12/14/2022   PLT 264 12/14/2022   Lab Results  Component Value Date   FERRITIN 174 12/22/2019   Attestation Statements:   Reviewed by clinician on day of visit: allergies, medications, problem list, medical history, surgical history, family history, social history, and previous encounter notes.   I, Burt Knack, am acting as transcriptionist for Reuben Likes, MD.  This is the patient's first visit at [facility]. The patient's NEW PATIENT PACKET was reviewed at length. Included in the packet: current and past health history, medications, allergies, ROS, gynecologic history (women only), surgical history, family history, social history, weight history, weight loss surgery history (for those that have had weight loss surgery), nutritional evaluation, mood and food questionnaire, PHQ9, Epworth questionnaire, sleep habits questionnaire, patient life and health improvement goals questionnaire. These will all be scanned into the patient's chart under media.   During the visit, I independently reviewed the patient's EKG, bioimpedance scale results, and indirect calorimeter results. I used this information to tailor a meal plan for the patient  that will help her to lose weight and will improve her obesity-related conditions going forward. I performed a medically necessary appropriate examination and/or evaluation. I discussed the assessment and treatment plan with the patient. The patient was provided an opportunity to ask questions and all were answered. The patient agreed with the plan and demonstrated an understanding of the instructions. Labs were ordered at this visit and will be reviewed at the next visit unless more critical results need to be addressed immediately. Clinical information was updated and documented in the EMR.    I have reviewed the above documentation for accuracy and completeness, and I agree with the above. - Reuben Likes, MD

## 2022-12-15 ENCOUNTER — Encounter (INDEPENDENT_AMBULATORY_CARE_PROVIDER_SITE_OTHER): Payer: Self-pay | Admitting: Family Medicine

## 2022-12-22 ENCOUNTER — Ambulatory Visit: Payer: BC Managed Care – PPO | Admitting: Nutrition

## 2022-12-25 LAB — COMPREHENSIVE METABOLIC PANEL
ALT: 20 IU/L (ref 0–32)
AST: 20 IU/L (ref 0–40)
Albumin: 4.3 g/dL (ref 3.8–4.9)
Alkaline Phosphatase: 63 IU/L (ref 44–121)
BUN/Creatinine Ratio: 15 (ref 9–23)
BUN: 13 mg/dL (ref 6–24)
Bilirubin Total: <0.2 mg/dL (ref 0.0–1.2)
CO2: 19 mmol/L — ABNORMAL LOW (ref 20–29)
Calcium: 8.9 mg/dL (ref 8.7–10.2)
Chloride: 101 mmol/L (ref 96–106)
Creatinine, Ser: 0.85 mg/dL (ref 0.57–1.00)
Globulin, Total: 2.4 g/dL (ref 1.5–4.5)
Glucose: 99 mg/dL (ref 70–99)
Potassium: 3.9 mmol/L (ref 3.5–5.2)
Sodium: 142 mmol/L (ref 134–144)
Total Protein: 6.7 g/dL (ref 6.0–8.5)
eGFR: 83 mL/min/{1.73_m2} (ref >59)

## 2022-12-25 LAB — SPECIMEN STATUS REPORT

## 2022-12-28 ENCOUNTER — Ambulatory Visit (INDEPENDENT_AMBULATORY_CARE_PROVIDER_SITE_OTHER): Payer: BC Managed Care – PPO | Admitting: Family Medicine

## 2022-12-28 ENCOUNTER — Encounter (INDEPENDENT_AMBULATORY_CARE_PROVIDER_SITE_OTHER): Payer: Self-pay | Admitting: Family Medicine

## 2022-12-28 VITALS — BP 91/63 | HR 86 | Temp 97.8°F | Ht 65.0 in | Wt 198.0 lb

## 2022-12-28 DIAGNOSIS — Z6833 Body mass index (BMI) 33.0-33.9, adult: Secondary | ICD-10-CM

## 2022-12-28 DIAGNOSIS — R7303 Prediabetes: Secondary | ICD-10-CM | POA: Diagnosis not present

## 2022-12-28 DIAGNOSIS — E559 Vitamin D deficiency, unspecified: Secondary | ICD-10-CM

## 2022-12-28 DIAGNOSIS — E669 Obesity, unspecified: Secondary | ICD-10-CM | POA: Diagnosis not present

## 2022-12-28 DIAGNOSIS — Z6832 Body mass index (BMI) 32.0-32.9, adult: Secondary | ICD-10-CM

## 2022-12-28 NOTE — Progress Notes (Signed)
Chief Complaint:   OBESITY Theresa [name] is here to discuss her progress with her obesity treatment plan along with follow-up of her obesity related diagnoses. Theresa [name] is on the Category 2 Plan and states she is following her eating plan approximately 90% of the time. Theresa [name] states she is walking 2 miles 3 times per week.    Today's visit was #: 2 Starting weight: 201 lbs Starting date: 12/14/2022 Today's weight: 198 lbs Today's date: 12/28/2022 Total lbs lost to date: 3 Total lbs lost since last in-office visit: 3  Interim History: Patient has been following the category II diet for 90% of the time able to follow the plan.  Protein intake has been going well - previously with Atkins diet.  Has a hard time with bloating with some shakes-would like to discuss this.  Has had headaches as well - has been told previously that this was related to hypoglycemia however this was not improved with what she ate/candy.  States that these headaches last for around 3 days - possible related to a migraine.  She would like to possibly off of zepbound by December-discussed what maintenance could look like.  She does like the bread on meal plan.  Subjective:   1. Vitamin D deficiency Patient is not on prescription vitamin D, and she notes fatigue.  Her recent vitamin D level was of 40.  2. Prediabetes Patient's recent A1c was 5.4 and insulin 15.  She is on Zepbound weekly 10 mg.  She has had this diagnosis for 10 years.  Patient carbohydrate cravings include potatoes.  Assessment/Plan:   1. Vitamin D deficiency Patient agreed to start OTC vitamin D.  2. Prediabetes Pathophysiology of insulin resistance, prediabetes, and diabetes mellitus were discussed with the patient today.  No change in medications.  3. BMI 33.0-33.9,adult  4. Obesity with starting BMI of 33.4 Theresa [name] is currently in the action stage of change. As such, her goal is to continue with weight loss efforts. She has agreed to the Category  2 Plan with protein shake options.   Exercise goals: No exercise has been prescribed at this time.  Behavioral modification strategies: increasing lean protein intake, meal planning and cooking strategies, keeping healthy foods in the home, and planning for success.  Theresa [name] has agreed to follow-up with our clinic in 3 weeks. She was informed of the importance of frequent follow-up visits to maximize her success with intensive lifestyle modifications for her multiple health conditions.   Objective:   Blood pressure 91/63, pulse 86, temperature 97.8 F (36.6 C), height 5\' 5"  (1.651 m), weight 198 lb (89.8 kg), SpO2 97%. Body mass index is 32.95 kg/m.  General: Cooperative, alert, well developed, in no acute distress. HEENT: Conjunctivae and lids unremarkable. Cardiovascular: Regular rhythm.  Lungs: Normal work of breathing. Neurologic: No focal deficits.   Lab Results  Component Value Date   CREATININE 0.85 12/14/2022   BUN 13 12/14/2022   NA 142 12/14/2022   K 3.9 12/14/2022   CL 101 12/14/2022   CO2 19 (L) 12/14/2022   Lab Results  Component Value Date   ALT 20 12/14/2022   AST 20 12/14/2022   ALKPHOS 63 12/14/2022   BILITOT <0.2 12/14/2022   Lab Results  Component Value Date   HGBA1C 5.4 12/14/2022   HGBA1C 6.0 (H) 04/12/2022   HGBA1C 6.0 (H) 02/27/2021   HGBA1C 5.8 (H) 05/01/2020   HGBA1C 5.8 (H) 12/19/2019   Lab Results  Component Value Date   INSULIN 15.8  12/14/2022   Lab Results  Component Value Date   TSH 0.891 12/14/2022   Lab Results  Component Value Date   CHOL 175 12/14/2022   HDL 57 12/14/2022   LDLCALC 103 (H) 12/14/2022   TRIG 79 12/14/2022   CHOLHDL 3.1 04/12/2022   Lab Results  Component Value Date   VD25OH 40.9 12/14/2022   VD25OH 28.5 (L) 12/20/2018   VD25OH 27.8 (L) 10/05/2017   Lab Results  Component Value Date   WBC 5.0 12/14/2022   HGB 12.2 12/14/2022   HCT 37.0 12/14/2022   MCV 93 12/14/2022   PLT 264 12/14/2022   Lab  Results  Component Value Date   FERRITIN 174 12/22/2019   Attestation Statements:   Reviewed by clinician on day of visit: allergies, medications, problem list, medical history, surgical history, family history, social history, and previous encounter notes.  Time spent on visit including pre-visit chart review and post-visit care and charting was 45 minutes.   I, Burt Knack, am acting as transcriptionist for Reuben Likes, MD.  I have reviewed the above documentation for accuracy and completeness, and I agree with the above. - Reuben Likes, MD

## 2023-01-03 ENCOUNTER — Encounter: Payer: Self-pay | Admitting: Nurse Practitioner

## 2023-01-05 ENCOUNTER — Other Ambulatory Visit: Payer: Self-pay | Admitting: Nurse Practitioner

## 2023-01-05 MED ORDER — AMPHETAMINE-DEXTROAMPHET ER 20 MG PO CP24: 20.0000 mg | ORAL_CAPSULE | ORAL | 0 refills | Status: DC

## 2023-01-20 ENCOUNTER — Ambulatory Visit
Admission: EM | Admit: 2023-01-20 | Discharge: 2023-01-20 | Disposition: A | Discharge status: Home / Self Care | Payer: BC Managed Care – PPO

## 2023-01-20 ENCOUNTER — Ambulatory Visit: Payer: BC Managed Care – PPO

## 2023-01-20 ENCOUNTER — Encounter: Payer: Self-pay | Admitting: Emergency Medicine

## 2023-01-20 ENCOUNTER — Other Ambulatory Visit: Payer: Self-pay

## 2023-01-20 DIAGNOSIS — R0602 Shortness of breath: Secondary | ICD-10-CM

## 2023-01-20 DIAGNOSIS — R059 Cough, unspecified: Secondary | ICD-10-CM

## 2023-01-20 DIAGNOSIS — R5383 Other fatigue: Secondary | ICD-10-CM | POA: Diagnosis not present

## 2023-01-20 DIAGNOSIS — M546 Pain in thoracic spine: Secondary | ICD-10-CM | POA: Diagnosis not present

## 2023-01-20 MED ORDER — TIZANIDINE HCL 4 MG PO TABS: 4.0000 mg | ORAL_TABLET | Freq: Three times a day (TID) | ORAL | 0 refills | Status: DC | PRN

## 2023-01-20 NOTE — Discharge Instructions (Addendum)
The chest x-ray did not show any abnormalities.  Your EKG also shows normal sinus rhythm. Increase fluids and allow for plenty of rest. Make sure you are eating 3 healthy meals per day and drinking plenty of fluids. As discussed, it is recommended that if symptoms continue to persist, that you follow-up with your primary care physician to discuss possible referral to cardiology or to follow-up with the cardiologist that you have seen in the past. Go to the emergency department immediately if you experience worsening shortness of breath, difficulty breathing, chest pain, or other concerns. Follow-up with your primary care physician if your fatigue worsens for further evaluation.  For your back: Take medication as prescribed. May take over-the-counter Tylenol as needed for pain or discomfort. Recommend the use of ice or heat.  Apply ice for pain or swelling, heat for spasm or stiffness.  Apply for 20 minutes, remove for 1 hour, repeat as needed. Try to remain as active as possible. If symptoms continue to persist, it is recommended that you follow-up with orthopedics for further evaluation.  You can follow-up with [facility] at [phone] or with [facility] at [phone]. Follow-up as needed.

## 2023-01-20 NOTE — ED Triage Notes (Signed)
States she has be feeling SOB over the past 2 weeks.  States she gets winded when walking 2 flights of stairs.  States mid back pain since June.  States pain went away for a while and has returned since last week.  States she continually yawns. States she feels pressure in her chest.

## 2023-01-20 NOTE — ED Provider Notes (Signed)
[facility]    CSN: [id number] Arrival date & time: 01/20/23  1817      History   Chief Complaint No chief complaint on file.   HPI Theresa [name] is a 52 y.o. female.   The history is provided by the patient.    Patient presents for complaints of fatigue, shortness of breath, back pain, and chest pressure.  Patient states that symptoms have been present over the past 2 weeks.  She states that she has noticed that she gets "winded" when she goes up a flight of stairs.  She also states that she has been yawning more.  Patient states that she feels like she has difficulty "catching" her breath.  She also complains that her back pain that was present in June has returned without new injury or trauma.  Patient states that she is currently on Zepbound and has lost quite a bit of weight, she does not understand why she is so fatigued.  Patient denies fever, chills, wheezing, abdominal pain, nausea, vomiting, or diarrhea.  Patient also denies prior history of heart disease.  She reports that she was diagnosed with a heart murmur and wore a monitor for a while.  Patient also denies history of anemia.  Per review of the patient's chart, patient was seen for the same or similar symptoms [facility]  Lab work was performed, which was normal at that time.  Past Medical History:  Diagnosis Date   Anxiety    Back pain    Borderline diabetic    CFS (chronic fatigue syndrome)    Constipation    Depression    Diverticular disease    Elevated liver enzymes    Epilepsy (HCC)    Fatty liver    Fibromyalgia    Gallbladder problem    GERD (gastroesophageal reflux disease)    Headache(784.0)    Heart murmur    History of degenerative disc disease    Hyperplastic colon polyp 10/28/2019   Dr Kendell Bane recommends repeat colonoscopy in 2028.   Hypertension    IBS (irritable bowel syndrome) w/ constipation    IFG (impaired fasting glucose)    Joint pain     Kidney problem    Lactose intolerance    Migraines    Obsessive-compulsive disorder    Osteoarthritis    Seizures (HCC)    last seizure at age 52-stressed induced seizure.   SOB (shortness of breath)    Swallowing difficulty    Torn meniscus    Vitamin D deficiency     Patient Active Problem List   Diagnosis Date Noted   Constipation 06/15/2022   Bruises easily 06/07/2022   Dizzy 06/07/2022   Vertigo 06/07/2022   Encounter for well woman exam with routine gynecological exam 04/12/2022   Encounter for screening fecal occult blood testing 04/12/2022   Screening examination for STD (sexually transmitted disease) 04/12/2022   Screening for diabetes mellitus 04/12/2022   Screening cholesterol level 04/12/2022   Screening for thyroid disorder 04/12/2022   Dyspareunia in female 04/12/2022   S/P hysterectomy 04/12/2022   Recurrent UTI 12/11/2021   SUI (stress urinary incontinence, female) 12/11/2021   Urinary urgency 12/11/2021   Overactive bladder 12/11/2021   Hypoestrogenism 12/11/2021   Nocturnal leg cramps 06/28/2021   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/02/2020   Acute respiratory failure with hypoxia (HCC) 12/22/2019   COVID-19 12/21/2019   Pneumonia due to COVID-19 virus 12/18/2019   Attention deficit disorder (ADD) without hyperactivity  11/15/2019   Hyperplastic colon polyp 10/28/2019   History of adenomatous polyp of colon 08/28/2019   Dysphagia 08/28/2019   Acute diverticulitis 08/13/2019   Hypokalemia 08/13/2019   Elevated liver transaminase level    Hair loss 07/11/2019   Brain fog 07/11/2019   Rectocele 07/11/2019   Sleep disturbance 07/11/2019   Gastroesophageal reflux disease without esophagitis 05/21/2019   Cervical radiculopathy 02/27/2019   Hypertensive disorder 02/27/2019   Adhesive capsulitis of right shoulder 11/06/2018   Prediabetes 11/26/2017   Migraine without aura and without status migrainosus, not intractable 10/04/2017   Class 2 severe obesity  due to excess calories with serious comorbidity and body mass index (BMI) of 35.0 to 35.9 in adult [facility]) 04/10/2016   Diverticulosis of large intestine 12/05/2015   BRCA negative 02/10/2015   IFG (impaired fasting glucose) 07/05/2014   Essential hypertension, benign 04/27/2014   Anxiety 04/27/2014   Abdominal pain, epigastric 02/07/2014   Nausea without vomiting 02/07/2014   Other fatigue 09/18/2012   Hyperglycemia 09/18/2012   Anxiety and depression 07/14/2012   Insomnia due to mental disorder 07/14/2012   Lower abdominal pain 04/12/2012   Headache 04/12/2012   Irritable bowel syndrome with constipation 04/12/2012    Past Surgical History:  Procedure Laterality Date   ABDOMINAL HYSTERECTOMY     still has right ovary   BIOPSY N/A 03/05/2014   Procedure: DUODENAL AND GASTRIC BIOPSY;  Surgeon: West Bali, MD;  Location: [facility];  Service: Endoscopy;  Laterality: N/A;   bladder sling X 3     CHILECTOMY Left 03/12/2020   Procedure: CHILECTOMY HALLUX;  Surgeon: Erskine Emery, DPM;  Location: [facility];  Service: Podiatry;  Laterality: Left;   CHOLECYSTECTOMY     COLONOSCOPY  04/24/2012   [id number]:[id number] polyp measuring 5 mm in size was found in the proximal transverse colon; polypectomy was performed with cold forceps Pedunculated polyp measuring 1.1 cm in size was found in the sigmoid colon; polypectomy was performed using snare cautery Moderate diverticulosis was noted in the descending colon and sigmoid colon small internal hemorrhoids. next tcs 04/2017   COLONOSCOPY WITH PROPOFOL N/A 10/23/2019   sigmoid and descending colon diverticulosis, one 3 mm polyp in cecum, one 4 mm polyp in descending colon. Hyperplastic   ESOPHAGOGASTRODUODENOSCOPY N/A 02/18/2014   Procedure: ESOPHAGOGASTRODUODENOSCOPY (EGD);  Surgeon: West Bali, MD;  Location: [facility];  Service: Endoscopy;  Laterality: N/A;  115   ESOPHAGOGASTRODUODENOSCOPY (EGD) WITH PROPOFOL N/A 03/05/2014   SLF: 1.  dyspepsia due to constipation, gastritis, and GERD 2. Multiple Gastric polyps 3. Moderate non-erosive gastritis 4. Diverticulum in the 2nd part of the duodenum   ESOPHAGOGASTRODUODENOSCOPY (EGD) WITH PROPOFOL N/A 10/23/2019   normal esophagus s/p dilation, normal stomach, normal duodenum.   MALONEY DILATION N/A 10/23/2019   Procedure: Elease Hashimoto DILATION;  Surgeon: Corbin Ade, MD;  Location: [facility];  Service: Endoscopy;  Laterality: N/A;   POLYPECTOMY  10/23/2019   Procedure: POLYPECTOMY;  Surgeon: Corbin Ade, MD;  Location: [facility];  Service: Endoscopy;;   TUBAL LIGATION      OB History     Gravida  2   Para  2   Term      Preterm      AB  0   Living  2      SAB      IAB      Ectopic  0   Multiple      Live Births  2  Home Medications    Prior to Admission medications   Medication Sig Start Date End Date Taking? Authorizing Provider  cholecalciferol (VITAMIN D3) 25 MCG (1000 UNIT) tablet Take 1,000 Units by mouth daily.   Yes [provider]  magnesium (MAGTAB) 84 MG ( ) TBCR SR tablet Take 84 mg by mouth.   Yes [provider]  tiZANidine (ZANAFLEX) 4 MG tablet Take 1 tablet (4 mg total) by mouth every 8 (eight) hours as needed for muscle spasms. 01/20/23  Yes Adrionna Delcid-Warren, Sadie Haber, NP  albuterol (VENTOLIN HFA) 108 (90 Base) MCG/ACT inhaler Inhale 2 puffs into the lungs every 6 (six) hours as needed for wheezing or shortness of breath. 12/04/22   Campbell Riches, NP  amphetamine-dextroamphetamine (ADDERALL XR) 20 MG 24 hr capsule Take 1 capsule (20 mg total) by mouth every morning. 01/05/23   Campbell Riches, NP  Bacillus Coagulans-Inulin (PROBIOTIC) 1-250 BILLION-MG CAPS Take by mouth.    [provider]  castor oil liquid Topical    [provider]  dexlansoprazole (DEXILANT) 60 MG capsule TAKE ONE CAPSULE BY MOUTH ONCE DAILY 05/31/22   Rourk, Gerrit Friends, MD  Digestive Enzymes (DIGESTIVE  ENZYME PO) Take by mouth.    [provider]  GARLIC PO Take by mouth. Garlic, honey and cayanne-BID    [provider]  lisinopril-hydrochlorothiazide (ZESTORETIC) 20-25 MG tablet Take 1 tablet by mouth daily. 04/12/22   Adline Potter, NP  ondansetron (ZOFRAN) 8 MG tablet TAKE ONE TABLET BY MOUTH THREE TIMES DAILY AS NEEDED FOR NAUSEA 12/01/22   Gelene Mink, NP  tirzepatide [facility]) 10 MG/0.5ML Pen Inject 10 mg into the skin once a week. 12/10/22   Campbell Riches, NP    Family History Family History  Problem Relation Age of Onset   Drug abuse Mother    Bipolar disorder Mother    Cancer Mother    Kidney disease Mother    Alcohol abuse Mother    Depression Mother    Anxiety disorder Mother    Paranoid behavior Mother    Hypertension Mother    Diabetes Mother    Breast cancer Mother    Obesity Mother    Obesity Father    Cancer Father    Stroke Father    Alcohol abuse Father    Hypertension Father    Diabetes Father    Physical abuse Brother    Healthy Daughter    GER disease Son    Healthy Son    Colon cancer Neg Hx    ADD / ADHD Neg Hx    Dementia Neg Hx    OCD Neg Hx    Schizophrenia Neg Hx    Seizures Neg Hx    Sexual abuse Neg Hx     Social History Social History   Tobacco Use   Smoking status: Never   Smokeless tobacco: Never  Vaping Use   Vaping status: Never Used  Substance Use Topics   Alcohol use: Yes    Comment: occas   Drug use: No     Allergies   Fentanyl and Prednisone   Review of Systems Review of Systems Per HPI  Physical Exam Triage Vital Signs ED Triage Vitals  Encounter Vitals Group     BP 01/20/23 1824 121/85     Systolic BP Percentile --      Diastolic BP Percentile --      Pulse Rate 01/20/23 1824 85     Resp 01/20/23 1824 18  Temp 01/20/23 1824 98.1 F (36.7 C)     Temp Source 01/20/23 1824 Oral     SpO2 01/20/23 1824 98 %     Weight --      Height --      Head Circumference --       Peak Flow --      Pain Score 01/20/23 1827 7     Pain Loc --      Pain Education --      Exclude from Growth Chart --    No data found.  Updated Vital Signs BP 121/85 (BP Location: Right Arm)   Pulse 85   Temp 98.1 F (36.7 C) (Oral)   Resp 18   SpO2 98%   Visual Acuity Right Eye Distance:   Left Eye Distance:   Bilateral Distance:    Right Eye Near:   Left Eye Near:    Bilateral Near:     Physical Exam Vitals and nursing note reviewed.  Constitutional:      General: She is not in acute distress.    Appearance: Normal appearance.  HENT:     Head: Normocephalic.  Eyes:     Extraocular Movements: Extraocular movements intact.     Conjunctiva/sclera: Conjunctivae normal.     Pupils: Pupils are equal, round, and reactive to light.  Cardiovascular:     Rate and Rhythm: Normal rate and regular rhythm.     Pulses: Normal pulses.     Heart sounds: Normal heart sounds.  Pulmonary:     Effort: Pulmonary effort is normal. No respiratory distress.     Breath sounds: Normal breath sounds. No stridor. No wheezing, rhonchi or rales.  Abdominal:     General: Bowel sounds are normal.     Palpations: Abdomen is soft.     Tenderness: There is no abdominal tenderness.  Musculoskeletal:     Cervical back: Normal range of motion.  Lymphadenopathy:     Cervical: No cervical adenopathy.  Skin:    General: Skin is warm and dry.  Neurological:     General: No focal deficit present.     Mental Status: She is alert and oriented to person, place, and time.  Psychiatric:        Mood and Affect: Mood normal.        Behavior: Behavior normal.      UC Treatments / Results  Labs (all labs ordered are listed, but only abnormal results are displayed) Labs Reviewed - No data to display  EKG: Normal sinus rhythm without ectopy, no STEMI.  Compared to EKGs performed on 10/28/2022 and 12/26/2019.   Radiology DG Chest 2 View  Result Date: 01/20/2023 CLINICAL DATA:  Cough and shortness  of breath EXAM: CHEST - 2 VIEW COMPARISON:  Chest x-ray 10/28/2022 FINDINGS: The heart size and mediastinal contours are within normal limits. Both lungs are clear. The visualized skeletal structures are unremarkable. IMPRESSION: No active cardiopulmonary disease. Electronically Signed   By: Darliss Cheney M.D.   On: 01/20/2023 19:55    Procedures Procedures (including critical care time)  Medications Ordered in UC Medications - No data to display  Initial Impression / Assessment and Plan / UC Course  I have reviewed the triage vital signs and the nursing notes.  Pertinent labs & imaging results that were available during my care of the patient were reviewed by me and considered in my medical decision making (see chart for details).  The patient is well-appearing, she is in no acute  distress, vital signs are stable.  Patient presents with complaints of shortness of breath and increased fatigue over the past 2 weeks.  On exam, lung sounds were clear throughout.  Patient is EKG and chest x-ray were normal.  Difficult to ascertain the cause of the patient's symptoms at this time.  Supportive care recommendations were provided and discussed with the patient to include increasing her fluids, and a healthy diet.  Discussion with patient to follow-up with her PCP or with her previous cardiologist for further evaluation.  Patient also advised to follow-up in the emergency department for worsening symptoms patient is in agreement with this plan of care and verbalizes understanding.  All questions were answered.  Patient stable for discharge.  Final Clinical Impressions(s) / UC Diagnoses   Final diagnoses:  Other fatigue  Shortness of breath  Thoracic back pain, unspecified back pain laterality, unspecified chronicity     Discharge Instructions      The chest x-ray did not show any abnormalities.  Your EKG also shows normal sinus rhythm. Increase fluids and allow for plenty of rest. Make sure you  are eating 3 healthy meals per day and drinking plenty of fluids. As discussed, it is recommended that if symptoms continue to persist, that you follow-up with your primary care physician to discuss possible referral to cardiology or to follow-up with the cardiologist that you have seen in the past. Go to the emergency department immediately if you experience worsening shortness of breath, difficulty breathing, chest pain, or other concerns. Follow-up with your primary care physician if your fatigue worsens for further evaluation.  For your back: Take medication as prescribed. May take over-the-counter Tylenol as needed for pain or discomfort. Recommend the use of ice or heat.  Apply ice for pain or swelling, heat for spasm or stiffness.  Apply for 20 minutes, remove for 1 hour, repeat as needed. Try to remain as active as possible. If symptoms continue to persist, it is recommended that you follow-up with orthopedics for further evaluation.  You can follow-up with [facility] at [phone] or with [facility] at [phone]. Follow-up as needed.      ED Prescriptions     Medication Sig Dispense Auth. Provider   tiZANidine (ZANAFLEX) 4 MG tablet Take 1 tablet (4 mg total) by mouth every 8 (eight) hours as needed for muscle spasms. 20 tablet Neelam Tiggs-Warren, Sadie Haber, NP      PDMP not reviewed this encounter.   Abran Cantor, NP 01/20/23 2012

## 2023-01-24 ENCOUNTER — Encounter (HOSPITAL_COMMUNITY): Payer: Self-pay | Admitting: Emergency Medicine

## 2023-01-24 ENCOUNTER — Emergency Department (HOSPITAL_COMMUNITY): Payer: BC Managed Care – PPO

## 2023-01-24 ENCOUNTER — Other Ambulatory Visit: Payer: Self-pay

## 2023-01-24 ENCOUNTER — Observation Stay (HOSPITAL_COMMUNITY)
Admission: EM | Admit: 2023-01-24 | Discharge: 2023-01-26 | Disposition: A | Discharge status: Home / Self Care | Payer: BC Managed Care – PPO | Attending: Internal Medicine | Admitting: Internal Medicine

## 2023-01-24 ENCOUNTER — Ambulatory Visit
Admission: EM | Admit: 2023-01-24 | Discharge: 2023-01-24 | Disposition: A | Discharge status: Home / Self Care | Payer: BC Managed Care – PPO

## 2023-01-24 DIAGNOSIS — K219 Gastro-esophageal reflux disease without esophagitis: Secondary | ICD-10-CM | POA: Diagnosis not present

## 2023-01-24 DIAGNOSIS — R079 Chest pain, unspecified: Secondary | ICD-10-CM | POA: Diagnosis not present

## 2023-01-24 DIAGNOSIS — R7989 Other specified abnormal findings of blood chemistry: Principal | ICD-10-CM | POA: Insufficient documentation

## 2023-01-24 DIAGNOSIS — R0682 Tachypnea, not elsewhere classified: Secondary | ICD-10-CM

## 2023-01-24 DIAGNOSIS — R778 Other specified abnormalities of plasma proteins: Secondary | ICD-10-CM | POA: Insufficient documentation

## 2023-01-24 DIAGNOSIS — G43909 Migraine, unspecified, not intractable, without status migrainosus: Secondary | ICD-10-CM | POA: Insufficient documentation

## 2023-01-24 DIAGNOSIS — I10 Essential (primary) hypertension: Secondary | ICD-10-CM | POA: Diagnosis not present

## 2023-01-24 DIAGNOSIS — R0789 Other chest pain: Principal | ICD-10-CM | POA: Diagnosis present

## 2023-01-24 DIAGNOSIS — Z79899 Other long term (current) drug therapy: Secondary | ICD-10-CM | POA: Diagnosis not present

## 2023-01-24 DIAGNOSIS — R000 Tachycardia, unspecified: Secondary | ICD-10-CM | POA: Diagnosis not present

## 2023-01-24 LAB — BASIC METABOLIC PANEL
Anion gap: 13 (ref 5–15)
BUN: 13 mg/dL (ref 6–20)
CO2: 19 mmol/L — ABNORMAL LOW (ref 22–32)
Calcium: 9.6 mg/dL (ref 8.9–10.3)
Chloride: 105 mmol/L (ref 98–111)
Creatinine, Ser: 0.93 mg/dL (ref 0.44–1.00)
GFR, Estimated: >60 mL/min (ref >60)
Glucose, Bld: 129 mg/dL — ABNORMAL HIGH (ref 70–99)
Potassium: 2.9 mmol/L — ABNORMAL LOW (ref 3.5–5.1)
Sodium: 137 mmol/L (ref 135–145)

## 2023-01-24 LAB — CBC
HCT: 37.7 % (ref 36.0–46.0)
Hemoglobin: 13.1 g/dL (ref 12.0–15.0)
MCH: 30.8 pg (ref 26.0–34.0)
MCHC: 34.7 g/dL (ref 30.0–36.0)
MCV: 88.5 fL (ref 80.0–100.0)
Platelets: 293 10*3/uL (ref 150–400)
RBC: 4.26 MIL/uL (ref 3.87–5.11)
RDW: 12.5 % (ref 11.5–15.5)
WBC: 9.6 10*3/uL (ref 4.0–10.5)
nRBC: 0 % (ref 0.0–0.2)

## 2023-01-24 LAB — MAGNESIUM: Magnesium: 1.9 mg/dL (ref 1.7–2.4)

## 2023-01-24 LAB — TROPONIN I (HIGH SENSITIVITY)
Troponin I (High Sensitivity): 16 ng/L (ref <18)
Troponin I (High Sensitivity): 36 ng/L — ABNORMAL HIGH (ref <18)

## 2023-01-24 MED ORDER — ASPIRIN 81 MG PO CHEW
324.0000 mg | CHEWABLE_TABLET | Freq: Once | ORAL | Status: AC
  Administered 2023-01-24: 324 mg via ORAL
  Filled 2023-01-24: qty 4

## 2023-01-24 MED ORDER — SODIUM CHLORIDE 0.9 % IV BOLUS: 1000.0000 mL | Freq: Once | INTRAVENOUS | Status: DC

## 2023-01-24 MED ORDER — DIAZEPAM 5 MG/ML IJ SOLN: 5.0000 mg | Freq: Once | INTRAMUSCULAR | Status: DC

## 2023-01-24 MED ORDER — DIAZEPAM 5 MG PO TABS
5.0000 mg | ORAL_TABLET | Freq: Once | ORAL | Status: AC
  Administered 2023-01-24: 5 mg via ORAL
  Filled 2023-01-24: qty 1

## 2023-01-24 MED ORDER — POTASSIUM CHLORIDE CRYS ER 20 MEQ PO TBCR
40.0000 meq | EXTENDED_RELEASE_TABLET | Freq: Once | ORAL | Status: AC
  Administered 2023-01-24: 40 meq via ORAL
  Filled 2023-01-24: qty 2

## 2023-01-24 NOTE — Discharge Instructions (Signed)
Go to the emergency department for further evaluation

## 2023-01-24 NOTE — ED Triage Notes (Addendum)
Pt c/o Chest pain, heavy breathing and, nausea pt was seen here last week for same symptoms. Provider recommended pt go by EMS pt has refused. Daughter will be taking her Via POV

## 2023-01-24 NOTE — ED Notes (Signed)
Patient is being discharged from the Urgent Care and sent to the Emergency Department via POV . Per Devra Dopp, NP. Patient is in need of higher level of care due to Chest pain. Patient is aware and verbalizes understanding of plan of care.  Vitals:   01/24/23 1858  BP: (!) 160/72  Pulse: (!) 146  Resp: (!) 26  Temp: (!) 97.5 F (36.4 C)  SpO2: 99%

## 2023-01-24 NOTE — ED Triage Notes (Signed)
PT bib daughter for panic attack. Pt was let go from job and has been breathing heavy and having chest pain since 4pm. Pt took klonopin at home just prior to coming to ER. Pt will not speak in triage and has daughter speaking for her. Daughter said she said she can't feel fingers and toes. Went to Urgent care for same and was told to come to ER.

## 2023-01-24 NOTE — ED Notes (Signed)
Patient resting in bed, refusing chest xray. EDP placed order for IV fluids and and Valium patient refused. Encouraged pt. to accept IVF, explained the hydration will be beneficial. Patient states, " I just want to go home."

## 2023-01-24 NOTE — ED Provider Notes (Signed)
[facility]    CSN: [id number] Arrival date & time: 01/24/23  4782      History   Chief Complaint No chief complaint on file.   HPI Theresa [name] is a 52 y.o. female.   The history is provided by the patient.   Patient presents for complaints of chest pain, chest tightness, shortness of breath, difficulty breathing.  Patient presents alone, it is difficult to ascertain any information as the patient reports difficulty speaking and catching her breath.  Patient is breathing approximately 20-30 times per minute.  She reports that she does have a history of anxiety, but states, "it has never been this bad before".  Patient with nausea, and dry heaving.  Patient was seen in this clinic on 01/20/2023 for complaints of fatigue, and shortness of breath.  Past Medical History:  Diagnosis Date   Anxiety    Back pain    Borderline diabetic    CFS (chronic fatigue syndrome)    Constipation    Depression    Diverticular disease    Elevated liver enzymes    Epilepsy (HCC)    Fatty liver    Fibromyalgia    Gallbladder problem    GERD (gastroesophageal reflux disease)    Headache(784.0)    Heart murmur    History of degenerative disc disease    Hyperplastic colon polyp 10/28/2019   Dr Kendell Bane recommends repeat colonoscopy in 2028.   Hypertension    IBS (irritable bowel syndrome) w/ constipation    IFG (impaired fasting glucose)    Joint pain    Kidney problem    Lactose intolerance    Migraines    Obsessive-compulsive disorder    Osteoarthritis    Seizures (HCC)    last seizure at age 64-stressed induced seizure.   SOB (shortness of breath)    Swallowing difficulty    Torn meniscus    Vitamin D deficiency     Patient Active Problem List   Diagnosis Date Noted   Constipation 06/15/2022   Bruises easily 06/07/2022   Dizzy 06/07/2022   Vertigo 06/07/2022   Encounter for well woman exam with routine gynecological exam 04/12/2022   Encounter for screening  fecal occult blood testing 04/12/2022   Screening examination for STD (sexually transmitted disease) 04/12/2022   Screening for diabetes mellitus 04/12/2022   Screening cholesterol level 04/12/2022   Screening for thyroid disorder 04/12/2022   Dyspareunia in female 04/12/2022   S/P hysterectomy 04/12/2022   Recurrent UTI 12/11/2021   SUI (stress urinary incontinence, female) 12/11/2021   Urinary urgency 12/11/2021   Overactive bladder 12/11/2021   Hypoestrogenism 12/11/2021   Nocturnal leg cramps 06/28/2021   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/02/2020   Acute respiratory failure with hypoxia (HCC) 12/22/2019   COVID-19 12/21/2019   Pneumonia due to COVID-19 virus 12/18/2019   Attention deficit disorder (ADD) without hyperactivity 11/15/2019   Hyperplastic colon polyp 10/28/2019   History of adenomatous polyp of colon 08/28/2019   Dysphagia 08/28/2019   Acute diverticulitis 08/13/2019   Hypokalemia 08/13/2019   Elevated liver transaminase level    Hair loss 07/11/2019   Brain fog 07/11/2019   Rectocele 07/11/2019   Sleep disturbance 07/11/2019   Gastroesophageal reflux disease without esophagitis 05/21/2019   Cervical radiculopathy 02/27/2019   Hypertensive disorder 02/27/2019   Adhesive capsulitis of right shoulder 11/06/2018   Prediabetes 11/26/2017   Migraine without aura and without status migrainosus, not intractable 10/04/2017   Class 2 severe obesity due to excess calories with  serious comorbidity and body mass index (BMI) of 35.0 to 35.9 in adult [facility]) 04/10/2016   Diverticulosis of large intestine 12/05/2015   BRCA negative 02/10/2015   IFG (impaired fasting glucose) 07/05/2014   Essential hypertension, benign 04/27/2014   Anxiety 04/27/2014   Abdominal pain, epigastric 02/07/2014   Nausea without vomiting 02/07/2014   Other fatigue 09/18/2012   Hyperglycemia 09/18/2012   Anxiety and depression 07/14/2012   Insomnia due to mental disorder 07/14/2012   Lower  abdominal pain 04/12/2012   Headache 04/12/2012   Irritable bowel syndrome with constipation 04/12/2012    Past Surgical History:  Procedure Laterality Date   ABDOMINAL HYSTERECTOMY     still has right ovary   BIOPSY N/A 03/05/2014   Procedure: DUODENAL AND GASTRIC BIOPSY;  Surgeon: West Bali, MD;  Location: [facility];  Service: Endoscopy;  Laterality: N/A;   bladder sling X 3     CHILECTOMY Left 03/12/2020   Procedure: CHILECTOMY HALLUX;  Surgeon: Erskine Emery, DPM;  Location: [facility];  Service: Podiatry;  Laterality: Left;   CHOLECYSTECTOMY     COLONOSCOPY  04/24/2012   ION:[id number] polyp measuring 5 mm in size was found in the proximal transverse colon; polypectomy was performed with cold forceps Pedunculated polyp measuring 1.1 cm in size was found in the sigmoid colon; polypectomy was performed using snare cautery Moderate diverticulosis was noted in the descending colon and sigmoid colon small internal hemorrhoids. next tcs 04/2017   COLONOSCOPY WITH PROPOFOL N/A 10/23/2019   sigmoid and descending colon diverticulosis, one 3 mm polyp in cecum, one 4 mm polyp in descending colon. Hyperplastic   ESOPHAGOGASTRODUODENOSCOPY N/A 02/18/2014   Procedure: ESOPHAGOGASTRODUODENOSCOPY (EGD);  Surgeon: West Bali, MD;  [facility]: [facility];  Service: Endoscopy;  Laterality: N/A;  115   ESOPHAGOGASTRODUODENOSCOPY (EGD) WITH PROPOFOL N/A 03/05/2014   SLF: 1. dyspepsia due to constipation, gastritis, and GERD 2. Multiple Gastric polyps 3. Moderate non-erosive gastritis 4. Diverticulum in the 2nd part of the duodenum   ESOPHAGOGASTRODUODENOSCOPY (EGD) WITH PROPOFOL N/A 10/23/2019   normal esophagus s/p dilation, normal stomach, normal duodenum.   MALONEY DILATION N/A 10/23/2019   Procedure: Elease Hashimoto DILATION;  Surgeon: Corbin Ade, MD;  [facility];  Service: Endoscopy;  Laterality: N/A;   POLYPECTOMY  10/23/2019   Procedure: POLYPECTOMY;  Surgeon: Corbin Ade, MD;   [facility];  Service: Endoscopy;;   TUBAL LIGATION      OB History     Gravida  2   Para  2   Term      Preterm      AB  0   Living  2      SAB      IAB      Ectopic  0   Multiple      Live Births  2            Home Medications    Prior to Admission medications   Medication Sig Start Date End Date Taking? Authorizing Provider  albuterol (VENTOLIN HFA) 108 (90 Base) MCG/ACT inhaler Inhale 2 puffs into the lungs every 6 (six) hours as needed for wheezing or shortness of breath. 12/04/22   Campbell Riches, NP  amphetamine-dextroamphetamine (ADDERALL XR) 20 MG 24 hr capsule Take 1 capsule (20 mg total) by mouth every morning. 01/05/23   Campbell Riches, NP  Bacillus Coagulans-Inulin (PROBIOTIC) 1-250 BILLION-MG CAPS Take by mouth.    [provider]  castor oil liquid  Topical    [provider]  cholecalciferol (VITAMIN D3) 25 MCG (1000 UNIT) tablet Take 1,000 Units by mouth daily.    [provider]  dexlansoprazole (DEXILANT) 60 MG capsule TAKE ONE CAPSULE BY MOUTH ONCE DAILY 05/31/22   Rourk, Gerrit Friends, MD  Digestive Enzymes (DIGESTIVE ENZYME PO) Take by mouth.    [provider]  GARLIC PO Take by mouth. Garlic, honey and cayanne-BID    [provider]  lisinopril-hydrochlorothiazide (ZESTORETIC) 20-25 MG tablet Take 1 tablet by mouth daily. 04/12/22   Adline Potter, NP  magnesium (MAGTAB) 84 MG ( ) TBCR SR tablet Take 84 mg by mouth.    [provider]  ondansetron (ZOFRAN) 8 MG tablet TAKE ONE TABLET BY MOUTH THREE TIMES DAILY AS NEEDED FOR NAUSEA 12/01/22   Gelene Mink, NP  tirzepatide [facility]) 10 MG/0.5ML Pen Inject 10 mg into the skin once a week. 12/10/22   Campbell Riches, NP  tiZANidine (ZANAFLEX) 4 MG tablet Take 1 tablet (4 mg total) by mouth every 8 (eight) hours as needed for muscle spasms. 01/20/23   Temiloluwa Recchia-Warren, Sadie Haber, NP    Family History Family History   Problem Relation Age of Onset   Drug abuse Mother    Bipolar disorder Mother    Cancer Mother    Kidney disease Mother    Alcohol abuse Mother    Depression Mother    Anxiety disorder Mother    Paranoid behavior Mother    Hypertension Mother    Diabetes Mother    Breast cancer Mother    Obesity Mother    Obesity Father    Cancer Father    Stroke Father    Alcohol abuse Father    Hypertension Father    Diabetes Father    Physical abuse Brother    Healthy Daughter    GER disease Son    Healthy Son    Colon cancer Neg Hx    ADD / ADHD Neg Hx    Dementia Neg Hx    OCD Neg Hx    Schizophrenia Neg Hx    Seizures Neg Hx    Sexual abuse Neg Hx     Social History Social History   Tobacco Use   Smoking status: Never   Smokeless tobacco: Never  Vaping Use   Vaping status: Never Used  Substance Use Topics   Alcohol use: Yes    Comment: occas   Drug use: No     Allergies   Fentanyl and Prednisone   Review of Systems Review of Systems Per HPI  Physical Exam Triage Vital Signs ED Triage Vitals [01/24/23 1858]  Encounter Vitals Group     BP (!) 160/72     Systolic BP Percentile      Diastolic BP Percentile      Pulse Rate (!) 146     Resp (!) 26     Temp (!) 97.5 F (36.4 C)     Temp Source Oral     SpO2 99 %     Weight      Height      Head Circumference      Peak Flow      Pain Score      Pain Loc      Pain Education      Exclude from Growth Chart    No data found.  Updated Vital Signs BP (!) 160/72 (BP Location: Right Arm)   Pulse (!) 146  Temp (!) 97.5 F (36.4 C) (Oral)   Resp (!) 26   SpO2 99%   Visual Acuity Right Eye Distance:   Left Eye Distance:   Bilateral Distance:    Right Eye Near:   Left Eye Near:    Bilateral Near:     Physical Exam Vitals and nursing note reviewed.  Constitutional:      Appearance: Normal appearance.  HENT:     Head: Normocephalic.  Eyes:     Extraocular Movements: Extraocular movements intact.      Pupils: Pupils are equal, round, and reactive to light.  Cardiovascular:     Rate and Rhythm: Tachycardia present.     Pulses: Normal pulses.     Heart sounds: Normal heart sounds.  Pulmonary:     Effort: Pulmonary effort is normal. No respiratory distress.     Breath sounds: Normal breath sounds. No stridor. No wheezing, rhonchi or rales.  Abdominal:     General: Bowel sounds are normal.     Palpations: Abdomen is soft.  Musculoskeletal:     Cervical back: Normal range of motion.  Lymphadenopathy:     Cervical: No cervical adenopathy.  Skin:    General: Skin is warm and dry.  Neurological:     General: No focal deficit present.     Mental Status: She is alert and oriented to person, place, and time.      UC Treatments / Results  Labs (all labs ordered are listed, but only abnormal results are displayed) Labs Reviewed - No data to display  EKG   Radiology No results found.  Procedures Procedures (including critical care time)  Medications Ordered in UC Medications - No data to display  Initial Impression / Assessment and Plan / UC Course  I have reviewed the triage vital signs and the nursing notes.  Pertinent labs & imaging results that were available during my care of the patient were reviewed by me and considered in my medical decision making (see chart for details).  Patient presents in moderate distress as she is breathing approximately 20-30 times per minute, heart rate is in the 140s.  She does have difficulty speaking, but can answer "no" when advised that she will be referred to the emergency department via ambulance.  Patient states that she will call her daughter at that time.  Patient does admit that she has have a history of anxiety, cannot rule out panic attack given the patient's current presentation and symptoms.  Given the patient's vital signs, and current symptoms, patient was referred to the emergency department for further evaluation.  Patient  elected to have her daughter take her to the emergency department via private vehicle.  Patient declined transport via EMS.  Patient discharged to the emergency department via private vehicle.  Final Clinical Impressions(s) / UC Diagnoses   Final diagnoses:  Chest pain, unspecified type  Tachycardia  Tachypnea     Discharge Instructions      Go to the emergency department for further evaluation.     ED Prescriptions   None    PDMP not reviewed this encounter.   Abran Cantor, NP 01/24/23 2017

## 2023-01-24 NOTE — ED Notes (Signed)
Lab called [facility] Diamond states that Troponin level is elevated to 32. Will notify EDP

## 2023-01-24 NOTE — H&P (Signed)
History and Physical    Patient: Theresa [name] VZD:[id number] DOB: Feb 15, 1971 DOA: 01/24/2023 DOS: the patient was seen and examined on 01/25/2023 PCP: Babs Sciara, MD  Patient coming from: Home  Chief Complaint:  Chief Complaint  Patient presents with   Panic Attack   HPI: Theresa [name] is a 52 y.o. female with medical history significant of GERD, hypertension, migraine headache, obesity who presents to the emergency department due to stress-induced chest pain.  Patient states that she got fired from work today.  She states that she has been dealing with stress for a long time and has been having intermittent chest pain which was usually brief and not so intense as the chest pain sustained today.  Usually, pain may be in her neck, back and oftentimes present as chest pressure with difficulty to breathe.  She states that she went to an urgent care few days earlier, chest x-ray done was negative EKG was negative.  She states that she took Klonopin after leaving urgent care and PTA without alleviating symptoms.  Patient also complained of tingling and numbness in the fingertips bilaterally.  She denies being on any blood thinner and denies any history of blood clots in the lungs or lower extremity.  ED Course:  In the emergency department, temperature was 90 7.50F, respiratory rate was 26, pulse 146 bpm, BP 160/72.  Workup in ED showed normal CBC, BMP was normal except for potassium of 2.9, bicarb 19, blood glucose 129, troponin 16 > 36 > 33.  Magnesium 1.9. Aspirin 325 mg x 1 was given, Valium 5 mg orally was also given.  potassium was replenished.  Hospitalist was asked to admit patient for further evaluation and management.  Review of Systems: Review of systems as noted in the HPI. All other systems reviewed and are negative.   Past Medical History:  Diagnosis Date   Anxiety    Back pain    Borderline diabetic    CFS (chronic fatigue syndrome)    Constipation    Depression     Diverticular disease    Elevated liver enzymes    Epilepsy (HCC)    Fatty liver    Fibromyalgia    Gallbladder problem    GERD (gastroesophageal reflux disease)    Headache(784.0)    Heart murmur    History of degenerative disc disease    Hyperplastic colon polyp 10/28/2019   Dr Kendell Bane recommends repeat colonoscopy in 2028.   Hypertension    IBS (irritable bowel syndrome) w/ constipation    IFG (impaired fasting glucose)    Joint pain    Kidney problem    Lactose intolerance    Migraines    Obsessive-compulsive disorder    Osteoarthritis    Seizures (HCC)    last seizure at age 72-stressed induced seizure.   SOB (shortness of breath)    Swallowing difficulty    Torn meniscus    Vitamin D deficiency    Past Surgical History:  Procedure Laterality Date   ABDOMINAL HYSTERECTOMY     still has right ovary   BIOPSY N/A 03/05/2014   Procedure: DUODENAL AND GASTRIC BIOPSY;  Surgeon: West Bali, MD;  Location: [facility];  Service: Endoscopy;  Laterality: N/A;   bladder sling X 3     CHILECTOMY Left 03/12/2020   Procedure: CHILECTOMY HALLUX;  Surgeon: Erskine Emery, DPM;  Location: [facility];  Service: Podiatry;  Laterality: Left;   CHOLECYSTECTOMY     COLONOSCOPY  04/24/2012   IRJ:[id number]  polyp measuring 5 mm in size was found in the proximal transverse colon; polypectomy was performed with cold forceps Pedunculated polyp measuring 1.1 cm in size was found in the sigmoid colon; polypectomy was performed using snare cautery Moderate diverticulosis was noted in the descending colon and sigmoid colon small internal hemorrhoids. next tcs 04/2017   COLONOSCOPY WITH PROPOFOL N/A 10/23/2019   sigmoid and descending colon diverticulosis, one 3 mm polyp in cecum, one 4 mm polyp in descending colon. Hyperplastic   ESOPHAGOGASTRODUODENOSCOPY N/A 02/18/2014   Procedure: ESOPHAGOGASTRODUODENOSCOPY (EGD);  Surgeon: West Bali, MD;  [facility];  Service: Endoscopy;   Laterality: N/A;  115   ESOPHAGOGASTRODUODENOSCOPY (EGD) WITH PROPOFOL N/A 03/05/2014   SLF: 1. dyspepsia due to constipation, gastritis, and GERD 2. Multiple Gastric polyps 3. Moderate non-erosive gastritis 4. Diverticulum in the 2nd part of the duodenum   ESOPHAGOGASTRODUODENOSCOPY (EGD) WITH PROPOFOL N/A 10/23/2019   normal esophagus s/p dilation, normal stomach, normal duodenum.   MALONEY DILATION N/A 10/23/2019   Procedure: Elease Hashimoto DILATION;  Surgeon: Corbin Ade, MD;  [facility];  Service: Endoscopy;  Laterality: N/A;   POLYPECTOMY  10/23/2019   Procedure: POLYPECTOMY;  Surgeon: Corbin Ade, MD;  [facility];  Service: Endoscopy;;   TUBAL LIGATION      Social History:  reports that she has never smoked. She has never used smokeless tobacco. She reports current alcohol use. She reports that she does not use drugs.   Allergies  Allergen Reactions   Fentanyl Other (See Comments)    Doesn't like the way it feels    Prednisone     Makes her feel bad, side effects    Family History  Problem Relation Age of Onset   Drug abuse Mother    Bipolar disorder Mother    Cancer Mother    Kidney disease Mother    Alcohol abuse Mother    Depression Mother    Anxiety disorder Mother    Paranoid behavior Mother    Hypertension Mother    Diabetes Mother    Breast cancer Mother    Obesity Mother    Obesity Father    Cancer Father    Stroke Father    Alcohol abuse Father    Hypertension Father    Diabetes Father    Physical abuse Brother    Healthy Daughter    GER disease Son    Healthy Son    Colon cancer Neg Hx    ADD / ADHD Neg Hx    Dementia Neg Hx    OCD Neg Hx    Schizophrenia Neg Hx    Seizures Neg Hx    Sexual abuse Neg Hx      Prior to Admission medications   Medication Sig Start Date End Date Taking? Authorizing Provider  albuterol (VENTOLIN HFA) 108 (90 Base) MCG/ACT inhaler Inhale 2 puffs into the lungs every 6 (six) hours as  needed for wheezing or shortness of breath. 12/04/22   Campbell Riches, NP  amphetamine-dextroamphetamine (ADDERALL XR) 20 MG 24 hr capsule Take 1 capsule (20 mg total) by mouth every morning. 01/05/23   Campbell Riches, NP  Bacillus Coagulans-Inulin (PROBIOTIC) 1-250 BILLION-MG CAPS Take by mouth.    [provider]  castor oil liquid Topical    [provider]  cholecalciferol (VITAMIN D3) 25 MCG (1000 UNIT) tablet Take 1,000 Units by mouth daily.    [provider]  dexlansoprazole (DEXILANT) 60 MG  capsule TAKE ONE CAPSULE BY MOUTH ONCE DAILY 05/31/22   Rourk, Gerrit Friends, MD  Digestive Enzymes (DIGESTIVE ENZYME PO) Take by mouth.    [provider]  GARLIC PO Take by mouth. Garlic, honey and cayanne-BID    [provider]  lisinopril-hydrochlorothiazide (ZESTORETIC) 20-25 MG tablet Take 1 tablet by mouth daily. 04/12/22   Adline Potter, NP  magnesium (MAGTAB) 84 MG ( ) TBCR SR tablet Take 84 mg by mouth.    [provider]  ondansetron (ZOFRAN) 8 MG tablet TAKE ONE TABLET BY MOUTH THREE TIMES DAILY AS NEEDED FOR NAUSEA 12/01/22   Gelene Mink, NP  tirzepatide [facility]) 10 MG/0.5ML Pen Inject 10 mg into the skin once a week. 12/10/22   Campbell Riches, NP  tiZANidine (ZANAFLEX) 4 MG tablet Take 1 tablet (4 mg total) by mouth every 8 (eight) hours as needed for muscle spasms. 01/20/23   Leath-Warren, Sadie Haber, NP    Physical Exam: BP 100/67 (BP Location: Right Arm)   Pulse 76   Temp 98.4 F (36.9 C) (Oral)   Resp 16   Ht 5\' 5"  (1.651 m)   Wt 88.9 kg   SpO2 97%   BMI 32.61 kg/m   General: 52 y.o. year-old female well developed well nourished in no acute distress.  Alert and oriented x3. HEENT: NCAT, EOMI Neck: Supple, trachea medial Cardiovascular: Regular rate and rhythm with no rubs or gallops.  No thyromegaly or JVD noted.  No lower extremity edema. 2/4 pulses in all 4 extremities. Respiratory: Clear to auscultation  with no wheezes or rales. Good inspiratory effort. Abdomen: Soft, nontender nondistended with normal bowel sounds x4 quadrants. Muskuloskeletal: No cyanosis, clubbing or edema noted bilaterally Neuro: CN II-XII intact, strength 5/5 x 4, sensation, reflexes intact Skin: No ulcerative lesions noted or rashes Psychiatry: Judgement and insight appear normal. Mood is appropriate for condition and setting          Labs on Admission:  Basic Metabolic Panel: Recent Labs  Lab 01/24/23 2018 01/24/23 2141  NA 137  --   K 2.9*  --   CL 105  --   CO2 19*  --   GLUCOSE 129*  --   BUN 13  --   CREATININE 0.93  --   CALCIUM 9.6  --   MG  --  1.9   Liver Function Tests: No results for input(s): "AST", "ALT", "ALKPHOS", "BILITOT", "PROT", "ALBUMIN" in the last 168 hours. No results for input(s): "LIPASE", "AMYLASE" in the last 168 hours. No results for input(s): "AMMONIA" in the last 168 hours. CBC: Recent Labs  Lab 01/24/23 2018 01/25/23 0501  WBC 9.6 6.2  HGB 13.1 11.0*  HCT 37.7 31.8*  MCV 88.5 88.6  PLT 293 237   Cardiac Enzymes: No results for input(s): "CKTOTAL", "CKMB", "CKMBINDEX", "TROPONINI" in the last 168 hours.  BNP (last 3 results) No results for input(s): "BNP" in the last 8760 hours.  ProBNP (last 3 results) No results for input(s): "PROBNP" in the last 8760 hours.  CBG: No results for input(s): "GLUCAP" in the last 168 hours.  Radiological Exams on Admission: No results found.  EKG: I independently viewed the EKG done and my findings are as followed: Normal sinus rhythm at a rate of 89 bpm  Assessment/Plan Present on Admission:  Atypical chest pain  Essential hypertension  GERD without esophagitis  Principal Problem:   Atypical chest pain Active Problems:   GERD without esophagitis   Essential hypertension  Elevated troponin   Migraine headache  Atypical Chest Pain Continue telemetry  Troponins x 3 - 16 > 36 > 33 EKG personally reviewed showed  normal sinus rhythm at a rate of 89 bpm Aspirin 324 mg p.o. x 1 was given Echocardiogram will be done in the morning Consider cardiology consult based on echo findings  Elevated troponin possibly secondary to type II demand ischemia Troponin flattened out - Troponins x 3 - 16 > 36 > 33  GERD Continue Protonix  Essential hypertension BP meds will be temporarily held due to soft BP  Migraine headache Stable  DVT prophylaxis: Lovenox  Advance Care Planning: CODE STATUS: Full code  Consults: None  Family Communication: None at bedside  Severity of Illness: The appropriate patient status for this patient is OBSERVATION. Observation status is judged to be reasonable and necessary in order to provide the required intensity of service to ensure the patient's safety. The patient's presenting symptoms, physical exam findings, and initial radiographic and laboratory data in the context of their medical condition is felt to place them at decreased risk for further clinical deterioration. Furthermore, it is anticipated that the patient will be medically stable for discharge from the hospital within 2 midnights of admission.   Author: Frankey Shown, DO 01/25/2023 6:40 AM  For on call review [email or web address].

## 2023-01-24 NOTE — ED Provider Notes (Signed)
[place] EMERGENCY DEPARTMENT AT [facility] Provider Note   CSN: [id number] Arrival date & time: 01/24/23  1939     History  Chief Complaint  Patient presents with   Panic Attack    Theresa [name] is a 52 y.o. female.  HPI 52 year old female with a history of anxiety and depression, prediabetes, GERD, diverticulosis, chronic nausea and other comorbidities presents with concern for stress-induced chest pain.  She states she was unexpectedly fired today and this caught her off guard.  She has been dealing with panic attacks and stress for a long time and for the past several months has been having on and off similar symptoms but never to this degree.  She will get pain in her back and chest pressure as well as a feeling of not being able to catch her breath and anxious.  She went to urgent care a few days ago and had a negative x-ray and a negative EKG.  Symptoms got a lot worse and she was not able to take her Klonopin and time at work.  She did take it after urgent care on the way here but still feels symptomatic.  She denies any leg swelling or history of DVT/PE.  She also started getting some bilateral fingertip tingling and numbness this afternoon.  Home Medications Prior to Admission medications   Medication Sig Start Date End Date Taking? Authorizing Provider  albuterol (VENTOLIN HFA) 108 (90 Base) MCG/ACT inhaler Inhale 2 puffs into the lungs every 6 (six) hours as needed for wheezing or shortness of breath. 12/04/22   Campbell Riches, NP  amphetamine-dextroamphetamine (ADDERALL XR) 20 MG 24 hr capsule Take 1 capsule (20 mg total) by mouth every morning. 01/05/23   Campbell Riches, NP  Bacillus Coagulans-Inulin (PROBIOTIC) 1-250 BILLION-MG CAPS Take by mouth.    [provider]  castor oil liquid Topical    [provider]  cholecalciferol (VITAMIN D3) 25 MCG (1000 UNIT) tablet Take 1,000 Units by mouth daily.    [provider]   dexlansoprazole (DEXILANT) 60 MG capsule TAKE ONE CAPSULE BY MOUTH ONCE DAILY 05/31/22   Rourk, Gerrit Friends, MD  Digestive Enzymes (DIGESTIVE ENZYME PO) Take by mouth.    [provider]  GARLIC PO Take by mouth. Garlic, honey and cayanne-BID    [provider]  lisinopril-hydrochlorothiazide (ZESTORETIC) 20-25 MG tablet Take 1 tablet by mouth daily. 04/12/22   Adline Potter, NP  magnesium (MAGTAB) 84 MG ( ) TBCR SR tablet Take 84 mg by mouth.    [provider]  ondansetron (ZOFRAN) 8 MG tablet TAKE ONE TABLET BY MOUTH THREE TIMES DAILY AS NEEDED FOR NAUSEA 12/01/22   Gelene Mink, NP  tirzepatide [facility]) 10 MG/0.5ML Pen Inject 10 mg into the skin once a week. 12/10/22   Campbell Riches, NP  tiZANidine (ZANAFLEX) 4 MG tablet Take 1 tablet (4 mg total) by mouth every 8 (eight) hours as needed for muscle spasms. 01/20/23   Leath-Warren, Sadie Haber, NP      Allergies    Fentanyl and Prednisone    Review of Systems   Review of Systems  Respiratory:  Positive for shortness of breath.   Cardiovascular:  Positive for chest pain. Negative for leg swelling.  Musculoskeletal:  Positive for back pain.  Psychiatric/Behavioral:  The patient is nervous/anxious.     Physical Exam Updated Vital Signs BP 106/78   Pulse 92   Temp 97.8 F (36.6 C) (Oral)  Resp 16   Ht 5\' 5"  (1.651 m)   Wt 86.6 kg   SpO2 99%   BMI 31.78 kg/m  Physical Exam Vitals and nursing note reviewed.  Constitutional:      Appearance: She is well-developed.  HENT:     Head: Normocephalic and atraumatic.  Cardiovascular:     Rate and Rhythm: Regular rhythm. Tachycardia present.     Heart sounds: Normal heart sounds.  Pulmonary:     Effort: Pulmonary effort is normal.     Breath sounds: Normal breath sounds. No wheezing, rhonchi or rales.  Abdominal:     General: There is no distension.     Palpations: Abdomen is soft.     Tenderness: There is no abdominal tenderness.   Musculoskeletal:     Right lower leg: No edema.     Left lower leg: No edema.  Skin:    General: Skin is warm and dry.  Neurological:     Mental Status: She is alert.  Psychiatric:     Comments: At first patient is calm but when she starts describing what has led to her symptoms today she becomes more and more stressed as well as talking a little faster and a little angry at the situation.  This has caused her heart rate to go up and some of her symptoms to recur.     ED Results / Procedures / Treatments   Labs (all labs ordered are listed, but only abnormal results are displayed) Labs Reviewed  BASIC METABOLIC PANEL - Abnormal; Notable for the following components:      Result Value   Potassium 2.9 (*)    CO2 19 (*)    Glucose, Bld 129 (*)    All other components within normal limits  TROPONIN I (HIGH SENSITIVITY) - Abnormal; Notable for the following components:   Troponin I (High Sensitivity) 36 (*)    All other components within normal limits  CBC  MAGNESIUM  TROPONIN I (HIGH SENSITIVITY)    EKG EKG Interpretation Date/Time:  Monday January 24 2023 23:19:30 EDT Ventricular Rate:  89 PR Interval:  174 QRS Duration:  100 QT Interval:  380 QTC Calculation: 463 R Axis:   -26  Text Interpretation: Sinus rhythm Abnormal R-wave progression, early transition Left ventricular hypertrophy Borderline T abnormalities, anterior leads similar to Sept 19 2024 Confirmed by Pricilla Loveless [phone]) on 01/24/2023 11:38:30 PM  Radiology No results found.  Procedures Procedures    Medications Ordered in ED Medications  potassium chloride SA (KLOR-CON M) CR tablet 40 mEq (40 mEq Oral Given 01/24/23 2157)  diazepam (VALIUM) tablet 5 mg (5 mg Oral Given 01/24/23 2201)  aspirin chewable tablet 324 mg (324 mg Oral Given 01/24/23 2330)    ED Course/ Medical Decision Making/ A&P                                 Medical Decision Making Amount and/or Complexity of Data  Reviewed Labs: ordered.    Details: Troponin has gone from 16-36 Radiology: ordered.    Details: Patient declined ECG/medicine tests: ordered and independent interpretation performed.    Details: Sinus tachycardia then resolved with nonspecific but unchanged T waves  Risk OTC drugs. Prescription drug management. Decision regarding hospitalization.   Patient symptoms are consistent with a panic attack from situational anxiety.  However, this time her troponin has increased.  Her heart rate is actually decreased, partially due to time  in calming down and partially due to some p.o. Valium.  She also has a hypokalemia which might explain some of the tingling she had and this will be repleted.  However the troponin could have been from tachycardia but she has never had this before and I think it is reasonable to have a further evaluation of her heart with echo and monitoring.  Will need repeat troponins.  She will be given some aspirin.  I think given the on-and-off nature of similar symptoms, as well as resolution of her tachycardia, I think PE is pretty unlikely.  Discussed with Dr. Thomes Dinning for admission.        Final Clinical Impression(s) / ED Diagnoses Final diagnoses:  Elevated troponin    Rx / DC Orders ED Discharge Orders     None         Pricilla Loveless, MD 01/24/23 2341

## 2023-01-25 ENCOUNTER — Observation Stay (HOSPITAL_BASED_OUTPATIENT_CLINIC_OR_DEPARTMENT_OTHER): Payer: BC Managed Care – PPO

## 2023-01-25 ENCOUNTER — Other Ambulatory Visit (HOSPITAL_COMMUNITY): Payer: BC Managed Care – PPO

## 2023-01-25 ENCOUNTER — Encounter (HOSPITAL_COMMUNITY): Payer: Self-pay | Admitting: Internal Medicine

## 2023-01-25 DIAGNOSIS — R0789 Other chest pain: Secondary | ICD-10-CM

## 2023-01-25 DIAGNOSIS — G43909 Migraine, unspecified, not intractable, without status migrainosus: Secondary | ICD-10-CM | POA: Insufficient documentation

## 2023-01-25 DIAGNOSIS — R079 Chest pain, unspecified: Secondary | ICD-10-CM | POA: Diagnosis not present

## 2023-01-25 DIAGNOSIS — R7989 Other specified abnormal findings of blood chemistry: Principal | ICD-10-CM | POA: Insufficient documentation

## 2023-01-25 LAB — COMPREHENSIVE METABOLIC PANEL
ALT: 33 U/L (ref 0–44)
AST: 23 U/L (ref 15–41)
Albumin: 3.4 g/dL — ABNORMAL LOW (ref 3.5–5.0)
Alkaline Phosphatase: 59 U/L (ref 38–126)
Anion gap: 10 (ref 5–15)
BUN: 10 mg/dL (ref 6–20)
CO2: 23 mmol/L (ref 22–32)
Calcium: 8.5 mg/dL — ABNORMAL LOW (ref 8.9–10.3)
Chloride: 106 mmol/L (ref 98–111)
Creatinine, Ser: 0.71 mg/dL (ref 0.44–1.00)
GFR, Estimated: >60 mL/min (ref >60)
Glucose, Bld: 98 mg/dL (ref 70–99)
Potassium: 3.4 mmol/L — ABNORMAL LOW (ref 3.5–5.1)
Sodium: 139 mmol/L (ref 135–145)
Total Bilirubin: 0.8 mg/dL (ref 0.3–1.2)
Total Protein: 6.2 g/dL — ABNORMAL LOW (ref 6.5–8.1)

## 2023-01-25 LAB — CBC
HCT: 31.8 % — ABNORMAL LOW (ref 36.0–46.0)
Hemoglobin: 11 g/dL — ABNORMAL LOW (ref 12.0–15.0)
MCH: 30.6 pg (ref 26.0–34.0)
MCHC: 34.6 g/dL (ref 30.0–36.0)
MCV: 88.6 fL (ref 80.0–100.0)
Platelets: 237 10*3/uL (ref 150–400)
RBC: 3.59 MIL/uL — ABNORMAL LOW (ref 3.87–5.11)
RDW: 12.7 % (ref 11.5–15.5)
WBC: 6.2 10*3/uL (ref 4.0–10.5)
nRBC: 0 % (ref 0.0–0.2)

## 2023-01-25 LAB — ECHOCARDIOGRAM COMPLETE
AR max vel: 2.4 cm2
AV Area VTI: 2.72 cm2
AV Area mean vel: 2.48 cm2
AV Mean grad: 5 mmHg
AV Peak grad: 9.7 mmHg
Ao pk vel: 1.56 m/s
Area-P 1/2: 3.89 cm2
Height: 65 in
MV VTI: 4.05 cm2
S' Lateral: 2.8 cm
Weight: 3135.82 oz

## 2023-01-25 LAB — LIPID PANEL
Cholesterol: 126 mg/dL (ref 0–200)
HDL: 43 mg/dL (ref >40)
LDL Cholesterol: 74 mg/dL (ref 0–99)
Total CHOL/HDL Ratio: 2.9 RATIO
Triglycerides: 44 mg/dL (ref <150)
VLDL: 9 mg/dL (ref 0–40)

## 2023-01-25 LAB — TROPONIN I (HIGH SENSITIVITY)
Troponin I (High Sensitivity): 33 ng/L — ABNORMAL HIGH (ref <18)
Troponin I (High Sensitivity): 25 ng/L — ABNORMAL HIGH (ref <18)

## 2023-01-25 LAB — HIV ANTIBODY (ROUTINE TESTING W REFLEX): HIV Screen 4th Generation wRfx: NONREACTIVE

## 2023-01-25 LAB — HEMOGLOBIN A1C
Hgb A1c MFr Bld: 5.5 % (ref 4.8–5.6)
Mean Plasma Glucose: 111.15 mg/dL

## 2023-01-25 MED ORDER — ENOXAPARIN SODIUM 40 MG/0.4ML IJ SOSY
40.0000 mg | PREFILLED_SYRINGE | INTRAMUSCULAR | Status: DC
  Administered 2023-01-25 – 2023-01-26 (×2): 40 mg via SUBCUTANEOUS
  Filled 2023-01-25 (×2): qty 0.4

## 2023-01-25 MED ORDER — ONDANSETRON HCL 4 MG/2ML IJ SOLN: 4.0000 mg | Freq: Four times a day (QID) | INTRAMUSCULAR | Status: DC | PRN

## 2023-01-25 MED ORDER — ACETAMINOPHEN 650 MG RE SUPP: 650.0000 mg | Freq: Four times a day (QID) | RECTAL | Status: DC | PRN

## 2023-01-25 MED ORDER — PANTOPRAZOLE SODIUM 40 MG PO TBEC
40.0000 mg | DELAYED_RELEASE_TABLET | Freq: Every day | ORAL | Status: DC
  Administered 2023-01-26: 40 mg via ORAL
  Filled 2023-01-25: qty 1

## 2023-01-25 MED ORDER — TECHNETIUM TC 99M TETROFOSMIN IV KIT
11.0000 | PACK | Freq: Once | INTRAVENOUS | Status: AC | PRN
  Administered 2023-01-25: 11 via INTRAVENOUS

## 2023-01-25 MED ORDER — ROSUVASTATIN CALCIUM 20 MG PO TABS
20.0000 mg | ORAL_TABLET | Freq: Every day | ORAL | Status: DC
  Administered 2023-01-25: 20 mg via ORAL
  Filled 2023-01-25: qty 1

## 2023-01-25 MED ORDER — ONDANSETRON HCL 4 MG PO TABS: 4.0000 mg | ORAL_TABLET | Freq: Four times a day (QID) | ORAL | Status: DC | PRN

## 2023-01-25 MED ORDER — ACETAMINOPHEN 325 MG PO TABS
650.0000 mg | ORAL_TABLET | Freq: Four times a day (QID) | ORAL | Status: DC | PRN
  Administered 2023-01-25 – 2023-01-26 (×2): 650 mg via ORAL
  Filled 2023-01-25 (×2): qty 2

## 2023-01-25 MED ORDER — ASPIRIN 81 MG PO TBEC
81.0000 mg | DELAYED_RELEASE_TABLET | Freq: Every day | ORAL | Status: DC
  Administered 2023-01-25: 81 mg via ORAL
  Filled 2023-01-25: qty 1

## 2023-01-25 MED ORDER — POTASSIUM CHLORIDE CRYS ER 20 MEQ PO TBCR
40.0000 meq | EXTENDED_RELEASE_TABLET | Freq: Once | ORAL | Status: AC
  Administered 2023-01-25: 40 meq via ORAL
  Filled 2023-01-25: qty 2

## 2023-01-25 NOTE — Plan of Care (Signed)

## 2023-01-25 NOTE — Progress Notes (Signed)
in the last 168 hours. Coagulation Profile: No results for input(s): "INR", "PROTIME" in the last 168 hours. Cardiac Enzymes: No results for input(s): "CKTOTAL", "CKMB", "CKMBINDEX", "TROPONINI" in the last 168 hours. BNP (last 3 results) No results for input(s): "PROBNP" in the last 8760 hours. HbA1C: Recent Labs    01/25/23 0526  HGBA1C 5.5   CBG: No results for input(s): "GLUCAP" in the last 168 hours. Lipid Profile: Recent Labs    01/25/23 0715  CHOL 126  HDL 43  LDLCALC 74  TRIG 44  CHOLHDL 2.9   Thyroid Function Tests: No results for input(s): "TSH", "T4TOTAL", "FREET4", "T3FREE", "THYROIDAB" in the last 72 hours. Anemia Panel: No results for  input(s): "VITAMINB12", "FOLATE", "FERRITIN", "TIBC", "IRON", "RETICCTPCT" in the last 72 hours. Sepsis Labs: No results for input(s): "PROCALCITON", "LATICACIDVEN" in the last 168 hours.  No results found for this or any previous visit (from the past 240 hour(s)).       Radiology Studies: ECHOCARDIOGRAM COMPLETE  Result Date: 01/25/2023    ECHOCARDIOGRAM REPORT   [name] Date of Exam: 01/25/2023 Medical Rec #:  [id number]       Height:       65.0 in Accession #:    [id number]      Weight:       196.0 lb Date of Birth:  [birth year]       BSA:          1.961 m Patient Age:    52 years        BP:           100/67 mmHg Patient Gender: F               HR:           74 bpm. [facility] Procedure: 2D Echo, Cardiac Doppler, Color Doppler and Strain Analysis Indications:    Chest Pain  History:        Patient has no prior history of Echocardiogram examinations.                 Signs/Symptoms:Chest Pain, Syncope and                 Dizziness/Lightheadedness; Risk Factors:Hypertension.  Sonographer:    Mikki Harbor Referring Phys: [id number] Kharter Sestak D [facility]  Sonographer Comments: Global longitudinal strain was attempted. IMPRESSIONS  1. Left ventricular ejection fraction, by estimation, is 55 to 60%. The left ventricle has normal function. The left ventricle has no regional wall motion abnormalities. Left ventricular diastolic parameters were normal. The average left ventricular global longitudinal strain is -18.1 %. The global longitudinal strain is normal.  2. Right ventricular systolic function is normal. The right ventricular size is normal. There is normal pulmonary artery systolic pressure.  3. The mitral valve is normal in structure. Trivial mitral valve regurgitation. No evidence of mitral stenosis.  4. The tricuspid valve is abnormal.  5. The aortic valve is tricuspid. Aortic valve regurgitation is not visualized. No aortic stenosis is present.  6. The inferior vena  cava is normal in size with greater than 50% respiratory variability, suggesting right atrial pressure of 3 mmHg. FINDINGS  Left Ventricle: Left ventricular ejection fraction, by estimation, is 55 to 60%. The left ventricle has normal function. The left ventricle has no regional wall motion abnormalities. The average left ventricular global longitudinal strain is -18.1 %. The global longitudinal strain is normal. The left ventricular internal cavity size was  PROGRESS NOTE    Theresa [name]  ZOX:[id number] DOB: 1971/02/25 DOA: 01/24/2023 PCP: Babs Sciara, MD   Brief Narrative:    Theresa [name] is a 52 y.o. female with medical history significant of GERD, hypertension, migraine headache, obesity who presents to the emergency department due to stress-induced chest pain.  She was admitted for chest pain with typical and atypical features with plans to undergo stress test per cardiology.  Assessment & Plan:   Principal Problem:   Atypical chest pain Active Problems:   GERD without esophagitis   Essential hypertension   Elevated troponin   Migraine headache  Assessment and Plan:   Elevated troponin in the setting of Chest Pain with mixed features 2D echocardiogram shows LVEF 60-65% with no wall motion abnormalities Second part of stress test to be performed in a.m. and anticipate discharge at that time if unremarkable   GERD Continue Protonix   Essential hypertension BP meds will be temporarily held due to soft BP   Migraine headache Stable   Obesity BMI 32.61   DVT prophylaxis:Lovenox Code Status: Full Family Communication: None at bedside Disposition Plan:  Status is: Observation The patient will require care spanning > 2 midnights and should be moved to inpatient because: Need for inpatient procedure  Consultants:  Cardiology  Procedures:  None  Antimicrobials:  None   Subjective: Patient seen and evaluated today with no new acute complaints or concerns. No acute concerns or events noted overnight.  She denies any current chest pain or shortness of breath.  Objective: Vitals:   01/25/23 0411 01/25/23 0450 01/25/23 0810 01/25/23 1222  BP: 100/67  (!) 85/56 98/63  Pulse: 76  76 78  Resp: 16  16 16   Temp: 98.4 F (36.9 C)  98.3 F (36.8 C) 98.5 F (36.9 C)  TempSrc: Oral  Oral   SpO2: 97% 97% 98% 96%  Weight:      Height:       No intake or output data in the 24 hours ending 01/25/23  1301 Filed Weights   01/24/23 1955 01/25/23 0101  Weight: 86.6 kg 88.9 kg    Examination:  General exam: Appears calm and comfortable  Respiratory system: Clear to auscultation. Respiratory effort normal. Cardiovascular system: S1 & S2 heard, RRR.  Gastrointestinal system: Abdomen is soft Central nervous system: Alert and awake Extremities: No edema Skin: No significant lesions noted Psychiatry: Flat affect.    Data Reviewed: I have personally reviewed following labs and imaging studies  CBC: Recent Labs  Lab 01/24/23 2018 01/25/23 0501  WBC 9.6 6.2  HGB 13.1 11.0*  HCT 37.7 31.8*  MCV 88.5 88.6  PLT 293 237   Basic Metabolic Panel: Recent Labs  Lab 01/24/23 2018 01/24/23 2141 01/25/23 0501  NA 137  --  139  K 2.9*  --  3.4*  CL 105  --  106  CO2 19*  --  23  GLUCOSE 129*  --  98  BUN 13  --  10  CREATININE 0.93  --  0.71  CALCIUM 9.6  --  8.5*  MG  --  1.9  --    GFR: Estimated Creatinine Clearance: 91.7 mL/min (by C-G formula based on SCr of 0.71 mg/dL). Liver Function Tests: Recent Labs  Lab 01/25/23 0501  AST 23  ALT 33  ALKPHOS 59  BILITOT 0.8  PROT 6.2*  ALBUMIN 3.4*   No results for input(s): "LIPASE", "AMYLASE" in the last 168 hours. No results for input(s): "AMMONIA"  PROGRESS NOTE    Theresa [name]  ZOX:[id number] DOB: 1971/02/25 DOA: 01/24/2023 PCP: Babs Sciara, MD   Brief Narrative:    Theresa [name] is a 52 y.o. female with medical history significant of GERD, hypertension, migraine headache, obesity who presents to the emergency department due to stress-induced chest pain.  She was admitted for chest pain with typical and atypical features with plans to undergo stress test per cardiology.  Assessment & Plan:   Principal Problem:   Atypical chest pain Active Problems:   GERD without esophagitis   Essential hypertension   Elevated troponin   Migraine headache  Assessment and Plan:   Elevated troponin in the setting of Chest Pain with mixed features 2D echocardiogram shows LVEF 60-65% with no wall motion abnormalities Second part of stress test to be performed in a.m. and anticipate discharge at that time if unremarkable   GERD Continue Protonix   Essential hypertension BP meds will be temporarily held due to soft BP   Migraine headache Stable   Obesity BMI 32.61   DVT prophylaxis:Lovenox Code Status: Full Family Communication: None at bedside Disposition Plan:  Status is: Observation The patient will require care spanning > 2 midnights and should be moved to inpatient because: Need for inpatient procedure  Consultants:  Cardiology  Procedures:  None  Antimicrobials:  None   Subjective: Patient seen and evaluated today with no new acute complaints or concerns. No acute concerns or events noted overnight.  She denies any current chest pain or shortness of breath.  Objective: Vitals:   01/25/23 0411 01/25/23 0450 01/25/23 0810 01/25/23 1222  BP: 100/67  (!) 85/56 98/63  Pulse: 76  76 78  Resp: 16  16 16   Temp: 98.4 F (36.9 C)  98.3 F (36.8 C) 98.5 F (36.9 C)  TempSrc: Oral  Oral   SpO2: 97% 97% 98% 96%  Weight:      Height:       No intake or output data in the 24 hours ending 01/25/23  1301 Filed Weights   01/24/23 1955 01/25/23 0101  Weight: 86.6 kg 88.9 kg    Examination:  General exam: Appears calm and comfortable  Respiratory system: Clear to auscultation. Respiratory effort normal. Cardiovascular system: S1 & S2 heard, RRR.  Gastrointestinal system: Abdomen is soft Central nervous system: Alert and awake Extremities: No edema Skin: No significant lesions noted Psychiatry: Flat affect.    Data Reviewed: I have personally reviewed following labs and imaging studies  CBC: Recent Labs  Lab 01/24/23 2018 01/25/23 0501  WBC 9.6 6.2  HGB 13.1 11.0*  HCT 37.7 31.8*  MCV 88.5 88.6  PLT 293 237   Basic Metabolic Panel: Recent Labs  Lab 01/24/23 2018 01/24/23 2141 01/25/23 0501  NA 137  --  139  K 2.9*  --  3.4*  CL 105  --  106  CO2 19*  --  23  GLUCOSE 129*  --  98  BUN 13  --  10  CREATININE 0.93  --  0.71  CALCIUM 9.6  --  8.5*  MG  --  1.9  --    GFR: Estimated Creatinine Clearance: 91.7 mL/min (by C-G formula based on SCr of 0.71 mg/dL). Liver Function Tests: Recent Labs  Lab 01/25/23 0501  AST 23  ALT 33  ALKPHOS 59  BILITOT 0.8  PROT 6.2*  ALBUMIN 3.4*   No results for input(s): "LIPASE", "AMYLASE" in the last 168 hours. No results for input(s): "AMMONIA"  in the last 168 hours. Coagulation Profile: No results for input(s): "INR", "PROTIME" in the last 168 hours. Cardiac Enzymes: No results for input(s): "CKTOTAL", "CKMB", "CKMBINDEX", "TROPONINI" in the last 168 hours. BNP (last 3 results) No results for input(s): "PROBNP" in the last 8760 hours. HbA1C: Recent Labs    01/25/23 0526  HGBA1C 5.5   CBG: No results for input(s): "GLUCAP" in the last 168 hours. Lipid Profile: Recent Labs    01/25/23 0715  CHOL 126  HDL 43  LDLCALC 74  TRIG 44  CHOLHDL 2.9   Thyroid Function Tests: No results for input(s): "TSH", "T4TOTAL", "FREET4", "T3FREE", "THYROIDAB" in the last 72 hours. Anemia Panel: No results for  input(s): "VITAMINB12", "FOLATE", "FERRITIN", "TIBC", "IRON", "RETICCTPCT" in the last 72 hours. Sepsis Labs: No results for input(s): "PROCALCITON", "LATICACIDVEN" in the last 168 hours.  No results found for this or any previous visit (from the past 240 hour(s)).       Radiology Studies: ECHOCARDIOGRAM COMPLETE  Result Date: 01/25/2023    ECHOCARDIOGRAM REPORT   [name] Date of Exam: 01/25/2023 Medical Rec #:  [id number]       Height:       65.0 in Accession #:    [id number]      Weight:       196.0 lb Date of Birth:  [birth year]       BSA:          1.961 m Patient Age:    52 years        BP:           100/67 mmHg Patient Gender: F               HR:           74 bpm. [facility] Procedure: 2D Echo, Cardiac Doppler, Color Doppler and Strain Analysis Indications:    Chest Pain  History:        Patient has no prior history of Echocardiogram examinations.                 Signs/Symptoms:Chest Pain, Syncope and                 Dizziness/Lightheadedness; Risk Factors:Hypertension.  Sonographer:    Mikki Harbor Referring Phys: [id number] Kharter Sestak D [facility]  Sonographer Comments: Global longitudinal strain was attempted. IMPRESSIONS  1. Left ventricular ejection fraction, by estimation, is 55 to 60%. The left ventricle has normal function. The left ventricle has no regional wall motion abnormalities. Left ventricular diastolic parameters were normal. The average left ventricular global longitudinal strain is -18.1 %. The global longitudinal strain is normal.  2. Right ventricular systolic function is normal. The right ventricular size is normal. There is normal pulmonary artery systolic pressure.  3. The mitral valve is normal in structure. Trivial mitral valve regurgitation. No evidence of mitral stenosis.  4. The tricuspid valve is abnormal.  5. The aortic valve is tricuspid. Aortic valve regurgitation is not visualized. No aortic stenosis is present.  6. The inferior vena  cava is normal in size with greater than 50% respiratory variability, suggesting right atrial pressure of 3 mmHg. FINDINGS  Left Ventricle: Left ventricular ejection fraction, by estimation, is 55 to 60%. The left ventricle has normal function. The left ventricle has no regional wall motion abnormalities. The average left ventricular global longitudinal strain is -18.1 %. The global longitudinal strain is normal. The left ventricular internal cavity size was

## 2023-01-25 NOTE — Progress Notes (Signed)
*  PRELIMINARY RESULTS* Echocardiogram 2D Echocardiogram has been performed.  Carolyne Fiscal 01/25/2023, 10:18 AM

## 2023-01-25 NOTE — Progress Notes (Signed)
   01/25/23 0920  TOC Brief Assessment  Insurance and Status Reviewed  Patient has primary care physician Yes  Home environment has been reviewed from home  Prior level of function: independent  Prior/Current Home Services No current home services  Social Determinants of Health Reivew SDOH reviewed no interventions necessary  Readmission risk has been reviewed Yes  Transition of care needs no transition of care needs at this time    Transition of Care Department [facility]) has reviewed patient and no TOC needs have been identified at this time. We will continue to monitor patient advancement through interdisciplinary progression rounds. If new patient transition needs arise, please place a TOC consult.

## 2023-01-25 NOTE — Consult Note (Addendum)
Cardiology Consultation   Patient ID: Theresa [name] MRN: [id number]; DOB: 28-Mar-1971  Admit date: 01/24/2023 Date of Consult: 01/25/2023  PCP:  Theresa Sciara, MD   [facility] Providers Cardiologist:  Theresa Docker, MD (Inactive)   {   Patient Profile:   Theresa [name] is [name] 52 y.o. female with [name] hx of chest pain, GERD, fibromyalgia, OCD, depression and anxiety, seizure, headache, osteoarthritis who is being seen 01/25/2023 for the evaluation of chest pain at the request of Dr. Sherryll [name].  History of Present Illness:   Ms. Theresa [name] has [name] long history of atypical chest pain, possibly from anxiety/stress or GERD.  It appears [name] treadmill stress test was ordered in 2020, but never performed.  The patient was last seen in March 2021 reporting rapid heart rate and palpitations.  [name] heart monitor and echo were ordered.  Heart monitor showed sinus rhythm with rare PACs and PVCs, brief atrial runs.  No sustained arrhythmias.  Echo showed LVEF 65 to 70%, normal RV SF, trivial MR.  Patient was subsequently lost to follow-up.  Patient presented to the [name], ER 01/24/2023 with shortness of breath, chest pain and panic attack.  She was sent from the urgent care with hypertension, tachycardia, tachypnea.  Patient declined EMS transport.  In the ER patient reported shortness of breath and chest pain. She reported she was unexpectedly fired yesterday. She started feeling short of breath related to anxiety and stress.  She also reports chest pain that was right-sided and pressure-like radiating to the back.  She reports she has had intermittent chest pain over the last few months.  It is mostly worse with stress and anxiety.  It can be worse with exertion as well.  Patient denies any recent lower leg edema, fevers, chills.  In the ER blood pressure 106/70, pulse 92, afebrile, respiratory rate 16.  Labs showed potassium 2.9, CO2 19, blood glucose 129, hemoglobin 13.1, WBC 9.6, magnesium 1.9.   High-sensitivity troponin 36> 33>25.  EKG showed sinus tachycardia with rate related changes.  Patient was given aspirin.  Patient denies active chest pain on interview.  She denies alcohol, tobacco, drug use.  Past Medical History:  Diagnosis Date   Anxiety    Back pain    Borderline diabetic    CFS (chronic fatigue syndrome)    Constipation    Depression    Diverticular disease    Elevated liver enzymes    Epilepsy (HCC)    Fatty liver    Fibromyalgia    Gallbladder problem    GERD (gastroesophageal reflux disease)    Headache(784.0)    Heart murmur    History of degenerative disc disease    Hyperplastic colon polyp 10/28/2019   Dr Theresa [name] recommends repeat colonoscopy in 2028.   Hypertension    IBS (irritable bowel syndrome) w/ constipation    IFG (impaired fasting glucose)    Joint pain    Kidney problem    Lactose intolerance    Migraines    Obsessive-compulsive disorder    Osteoarthritis    Seizures (HCC)    last seizure at age 66-stressed induced seizure.   SOB (shortness of breath)    Swallowing difficulty    Torn meniscus    Vitamin D deficiency     Past Surgical History:  Procedure Laterality Date   ABDOMINAL HYSTERECTOMY     still has right ovary   BIOPSY N/[name] 03/05/2014   Procedure: DUODENAL AND GASTRIC BIOPSY;  Surgeon: Theresa Cleaver  Fields, MD;  Location: [facility];  Service: Endoscopy;  Laterality: N/[name];   bladder sling X 3     CHILECTOMY Left 03/12/2020   Procedure: CHILECTOMY HALLUX;  Surgeon: Theresa [name], DPM;  Location: [facility];  Service: Podiatry;  Laterality: Left;   CHOLECYSTECTOMY     COLONOSCOPY  04/24/2012   KGM:WNUUVOZ polyp measuring 5 mm in size was found in the proximal transverse colon; polypectomy was performed with cold forceps Pedunculated polyp measuring 1.1 cm in size was found in the sigmoid colon; polypectomy was performed using snare cautery Moderate diverticulosis was noted in the descending colon and sigmoid colon small internal  hemorrhoids. next tcs 04/2017   COLONOSCOPY WITH PROPOFOL N/[name] 10/23/2019   sigmoid and descending colon diverticulosis, one 3 mm polyp in cecum, one 4 mm polyp in descending colon. Hyperplastic   ESOPHAGOGASTRODUODENOSCOPY N/[name] 02/18/2014   Procedure: ESOPHAGOGASTRODUODENOSCOPY (EGD);  Surgeon: Theresa Bali, MD;  Location: [facility];  Service: Endoscopy;  Laterality: N/[name];  115   ESOPHAGOGASTRODUODENOSCOPY (EGD) WITH PROPOFOL N/[name] 03/05/2014   SLF: 1. dyspepsia due to constipation, gastritis, and GERD 2. Multiple Gastric polyps 3. Moderate non-erosive gastritis 4. Diverticulum in the 2nd part of the duodenum   ESOPHAGOGASTRODUODENOSCOPY (EGD) WITH PROPOFOL N/[name] 10/23/2019   normal esophagus s/p dilation, normal stomach, normal duodenum.   MALONEY DILATION N/[name] 10/23/2019   Procedure: Theresa [name] DILATION;  Surgeon: Theresa Ade, MD;  Location: [facility];  Service: Endoscopy;  Laterality: N/[name];   POLYPECTOMY  10/23/2019   Procedure: POLYPECTOMY;  Surgeon: Theresa Ade, MD;  Location: [facility];  Service: Endoscopy;;   TUBAL LIGATION       Home Medications:  Prior to Admission medications   Medication Sig Start Date End Date Taking? Authorizing Provider  albuterol (VENTOLIN HFA) 108 (90 Base) MCG/ACT inhaler Inhale 2 puffs into the lungs every 6 (six) hours as needed for wheezing or shortness of breath. 12/04/22  Yes Theresa Riches, NP  amphetamine-dextroamphetamine (ADDERALL XR) 20 MG 24 hr capsule Take 1 capsule (20 mg total) by mouth every morning. 01/05/23  Yes Theresa Riches, NP  cholecalciferol (VITAMIN D3) 25 MCG (1000 UNIT) tablet Take 1,000 Units by mouth daily.   Yes [provider]  dexlansoprazole (DEXILANT) 60 MG capsule TAKE ONE CAPSULE BY MOUTH ONCE DAILY 05/31/22  Yes Rourk, Gerrit Friends, MD  lisinopril-hydrochlorothiazide (ZESTORETIC) 20-25 MG tablet Take 1 tablet by mouth daily. 04/12/22  Yes Theresa [name] A, NP  magnesium oxide (MAG-OX) 400 MG tablet  daily.   Yes [provider]  ondansetron (ZOFRAN) 8 MG tablet TAKE ONE TABLET BY MOUTH THREE TIMES DAILY AS NEEDED FOR NAUSEA 12/01/22  Yes Theresa Mink, NP  tirzepatide (ZEPBOUND) 10 MG/0.5ML Pen Inject 10 mg into the skin once [name] week. 12/10/22  Yes Theresa Riches, NP  tiZANidine (ZANAFLEX) 4 MG tablet Take 1 tablet (4 mg total) by mouth every 8 (eight) hours as needed for muscle spasms. 01/20/23   Leath-Warren, Sadie Haber, NP    Inpatient Medications: Scheduled Meds:  enoxaparin (LOVENOX) injection  40 mg Subcutaneous Q24H   potassium chloride SA  40 mEq Oral Once   Continuous Infusions:  PRN Meds: acetaminophen **OR** acetaminophen, ondansetron **OR** ondansetron (ZOFRAN) IV  Allergies:    Allergies  Allergen Reactions   Fentanyl Other (See Comments)    Doesn't like the way it feels    Prednisone     Makes her feel bad, side effects    Social History:  Social History   Socioeconomic History   Marital status: Divorced    Spouse name: Not on file   Number of children: 2   Years of education: Not on file   Highest education level: 12th grade  Occupational History   Occupation: Licensed CSR [employer]  Tobacco Use   Smoking status: Never   Smokeless tobacco: Never  Vaping Use   Vaping status: Never Used  Substance and Sexual Activity   Alcohol use: Yes    Comment: occas   Drug use: No   Sexual activity: Not Currently    Birth control/protection: Surgical    Comment: tubal, hyst  Other Topics Concern   Not on file  Social History Narrative   Not on file   Social Determinants of Health   Financial Resource Strain: High Risk (11/10/2022)   Overall Financial Resource Strain (CARDIA)    Difficulty of Paying Living Expenses: Very hard  Food Insecurity: Patient Declined (01/25/2023)   Hunger Vital Sign    Worried About Running Out of Food in the Last Year: Patient declined    Ran Out of Food in the Last Year: Patient declined  Transportation Needs: Patient  Declined (01/25/2023)   PRAPARE - Administrator, Civil Service (Medical): Patient declined    Lack of Transportation (Non-Medical): Patient declined  Physical Activity: Inactive (11/10/2022)   Exercise Vital Sign    Days of Exercise per Week: 0 days    Minutes of Exercise per Session: 20 min  Stress: Stress Concern Present (11/10/2022)   [employer] of Occupational Health - Occupational Stress Questionnaire    Feeling of Stress : Very much  Social Connections: Socially Isolated (11/10/2022)   Social Connection and Isolation Panel [NHANES]    Frequency of Communication with [name] and Family: More than three times [name] week    Frequency of Social Gatherings with [name] and Family: Twice [name] week    Attends Religious Services: Never    Database administrator or Organizations: No    Attends Banker Meetings: Never    Marital Status: Divorced  Catering manager Violence: Patient Declined (01/25/2023)   Humiliation, Afraid, Rape, and Kick questionnaire    Fear of Current or Ex-Partner: Patient declined    Emotionally Abused: Patient declined    Physically Abused: Patient declined    Sexually Abused: Patient declined    Family History:    Family History  Problem Relation Age of Onset   Drug abuse Mother    Bipolar disorder Mother    Cancer Mother    Kidney disease Mother    Alcohol abuse Mother    Depression Mother    Anxiety disorder Mother    Paranoid behavior Mother    Hypertension Mother    Diabetes Mother    Breast cancer Mother    Obesity Mother    Obesity Father    Cancer Father    Stroke Father    Alcohol abuse Father    Hypertension Father    Diabetes Father    Physical abuse Brother    Healthy Daughter    GER disease Son    Healthy Son    Colon cancer Neg Hx    ADD / ADHD Neg Hx    Dementia Neg Hx    OCD Neg Hx    Schizophrenia Neg Hx    Seizures Neg Hx    Sexual abuse Neg Hx      ROS:  Please see the history of present illness.  All other ROS reviewed and negative.     Physical Exam/Data:   Vitals:   01/25/23 0101 01/25/23 0411 01/25/23 0450 01/25/23 0810  BP:  100/67  (!) 85/56  Pulse:  76  76  Resp:  16  16  Temp:  98.4 F (36.9 C)  98.3 F (36.8 C)  TempSrc:  Oral  Oral  SpO2:  97% 97% 98%  Weight: 88.9 kg     Height: 5\' 5"  (1.651 m)      No intake or output data in the 24 hours ending 01/25/23 0902    01/25/2023    1:01 AM 01/24/2023    7:55 PM 12/28/2022    2:00 PM  Last 3 Weights  Weight (lbs) 195 lb 15.8 oz 191 lb 198 lb  Weight (kg) 88.9 kg 86.637 kg 89.812 kg     Body mass index is 32.61 kg/m.  General:  Well nourished, well developed, in no acute distress HEENT: normal Neck: no JVD Vascular: No carotid bruits; Distal pulses 2+ bilaterally Cardiac:  normal S1, S2; RRR; no murmur  Lungs:  clear to auscultation bilaterally, no wheezing, rhonchi or rales  Abd: soft, nontender, no hepatomegaly  Ext: no edema Musculoskeletal:  No deformities, BUE and BLE strength normal and equal Skin: warm and dry  Neuro:  CNs 2-12 intact, no focal abnormalities noted Psych:  Normal affect   EKG:  The EKG was personally reviewed and demonstrates: Sinus tachycardia, 142 bpm, likely rate related ST changes Telemetry:  Telemetry was personally reviewed and demonstrates:  NSR HR 80s  Relevant CV Studies:  Echo pending  Echo 2021 1. Left ventricular ejection fraction, by estimation, is 65 to 70%. The  left ventricle has normal function. The left ventricle has no regional  wall motion abnormalities. Left ventricular diastolic parameters were  normal.   2. Right ventricular systolic function is normal. The right ventricular  size is normal. There is normal pulmonary artery systolic pressure. The  estimated right ventricular systolic pressure is 15.0 mmHg.   3. The mitral valve is grossly normal. Trivial mitral valve  regurgitation.   4. The aortic valve is tricuspid. Aortic valve regurgitation is not   visualized.   5. The inferior vena cava is normal in size with greater than 50%  respiratory variability, suggesting right atrial pressure of 3 mmHg.   Heart monitor 2021 Sinus rhythm with rare PACs and PVCs and brief atrial runs. No sustained arrhythmias.   Laboratory Data:  High Sensitivity Troponin:   Recent Labs  Lab 01/24/23 2018 01/24/23 2141 01/25/23 0501 01/25/23 0708  TROPONINIHS 16 36* 33* 25*     Chemistry Recent Labs  Lab 01/24/23 2018 01/24/23 2141 01/25/23 0501  NA 137  --  139  K 2.9*  --  3.4*  CL 105  --  106  CO2 19*  --  23  GLUCOSE 129*  --  98  BUN 13  --  10  CREATININE 0.93  --  0.71  CALCIUM 9.6  --  8.5*  MG  --  1.9  --   GFRNONAA >60  --  >60  ANIONGAP 13  --  10    Recent Labs  Lab 01/25/23 0501  PROT 6.2*  ALBUMIN 3.4*  AST 23  ALT 33  ALKPHOS 59  BILITOT 0.8   Lipids No results for input(s): "CHOL", "TRIG", "HDL", "LABVLDL", "LDLCALC", "CHOLHDL" in the last 168 hours.  Hematology Recent Labs  Lab 01/24/23 2018 01/25/23 0501  WBC 9.6  6.2  RBC 4.26 3.59*  HGB 13.1 11.0*  HCT 37.7 31.8*  MCV 88.5 88.6  MCH 30.8 30.6  MCHC 34.7 34.6  RDW 12.5 12.7  PLT 293 237   Thyroid No results for input(s): "TSH", "FREET4" in the last 168 hours.  BNPNo results for input(s): "BNP", "PROBNP" in the last 168 hours.  DDimer No results for input(s): "DDIMER" in the last 168 hours.   Radiology/Studies:  No results found.   Assessment and Plan:   Chest pain and shortness of breath -Patient presented with chest pain and shortness of breath.  She had been expectedly fired from her job causing extreme stress. -She does report months of intermittent right sided chest pressure, more atypical. Pain improved with ASA and Valium -High-sensitivity troponin minimally elevated but flat trend, 16>36>33>25 -No prior ischemic workup -EKG stable -Check A1c and lipid panel -Echo pending - agree with not starting IV heparin - start Aspirin 81mg   daily and Crestor 20mg  daily - ischemic testing consideration per MD.   HTN - PTA lisinopril-hydrochlorothiazide 20-25mg  daily held on admission - BP has been low, may need IVF  HLD - LDL 103 - start statin  For questions or updates, please contact [facility] Please consult [email or web address] for contact info under    Signed, Cadence David Stall, PA-C  01/25/2023 9:02 AM   Attending note   Patient seen and discussed with PA Fransico Michael, I agree with her documentation. 52 yo female history of HTN, prior chest pain with evaluation in cardiology clinic in  2020. Stress test was ordered by never completed. Presents with chest pain  Reports intermittent episodes of chest pain that start between shoulder blades and radiate to midchest. Can come on at rest or with activity, often triggered by stress as well. Most severe episode prior to admission shortly after learning she had been fired from her job. These pains can last for hours at [name] time. She also reports several weeks of DOE, for example walking up [name] flight of stairs will cause dyspnea and also chest tightness that takes [name] few minutes to resolve.        ER vitals:p 146 bp 160/72   K 2.9 BUN 13 Cr 0.93 WBC 9.6 Hgb 13.1 Plt 293 Mg 1.9 Trop 16-->36-->33-->25 CXR no acute process EKG sinus tach 142, no specific ischemic changes. F/u EKG SR, nonspecific ST/T changes     12/2019 echo: LVEF 65-70%, no WMAs, normal diastolic fxn, normal RV     1.Chest pain - history of chest pain. Stress tests ordered from cards clinic in 2020 and 2021 but never completed - current symptoms are mixed with some typical and atypical features. Does have some exertional symptoms.  - mild trop though in [name] crescendo/decrescendo pattern. Presented hypertensive, tachycardic. - echo shows LVEF 60-65%, no WMAs - plan for lexiscan to further evaluate    Dina Rich MD

## 2023-01-25 NOTE — Progress Notes (Incomplete)
Attending note  Patient seen and discussed with PA Fransico Michael, I agree with her documentation. 52 yo female history of HTN, prior chest pain with evaluation in cardiology clinic in  2020. Stress test was ordered by never completed    ER vitals:p 146 bp 160/72   K 2.9 BUN 13 Cr 0.93 WBC 9.6 Hgb 13.1 Plt 293 Mg 1.9 Trop 16-->36-->33-->25 CXR no acute process EKG sinus tach 142, no specific ischemic changes. F/u EKG SR, nonspecific ST/T changes   12/2019 echo: LVEF 65-70%, no WMAs, normal diastolic fxn, normal RV   1.Chest pain - history of chest pain. Stress tests ordered from cards clinic in 2020 and 2021 but never completed  - - chest pain after getting fired from job today, however has had longer history - mild trop though in a crescendo/decrescendo pattern. Presented hypertensive, tachycardic. Unclear if demand ischemia.

## 2023-01-26 ENCOUNTER — Ambulatory Visit (INDEPENDENT_AMBULATORY_CARE_PROVIDER_SITE_OTHER): Payer: BC Managed Care – PPO | Admitting: Family Medicine

## 2023-01-26 ENCOUNTER — Ambulatory Visit: Payer: BC Managed Care – PPO | Admitting: Nurse Practitioner

## 2023-01-26 DIAGNOSIS — R0789 Other chest pain: Secondary | ICD-10-CM | POA: Diagnosis not present

## 2023-01-26 LAB — NM MYOCAR MULTI W/SPECT W/WALL MOTION / EF
LV dias vol: 58 mL (ref 46–106)
LV sys vol: 15 mL
Nuc Stress EF: 74 %
Peak HR: 114 {beats}/min
RATE: 0.5
Rest HR: 82 {beats}/min
Rest Nuclear Isotope Dose: 11 mCi
SDS: 3
SRS: 1
SSS: 4
ST Depression (mm): 0 mm
Stress Nuclear Isotope Dose: 31 mCi
TID: 1.19

## 2023-01-26 LAB — CBC
HCT: 34.8 % — ABNORMAL LOW (ref 36.0–46.0)
Hemoglobin: 11.2 g/dL — ABNORMAL LOW (ref 12.0–15.0)
MCH: 29.7 pg (ref 26.0–34.0)
MCHC: 32.2 g/dL (ref 30.0–36.0)
MCV: 92.3 fL (ref 80.0–100.0)
Platelets: 234 10*3/uL (ref 150–400)
RBC: 3.77 MIL/uL — ABNORMAL LOW (ref 3.87–5.11)
RDW: 13.1 % (ref 11.5–15.5)
WBC: 5.2 10*3/uL (ref 4.0–10.5)
nRBC: 0 % (ref 0.0–0.2)

## 2023-01-26 LAB — BASIC METABOLIC PANEL
Anion gap: 8 (ref 5–15)
BUN: 10 mg/dL (ref 6–20)
CO2: 24 mmol/L (ref 22–32)
Calcium: 8.7 mg/dL — ABNORMAL LOW (ref 8.9–10.3)
Chloride: 105 mmol/L (ref 98–111)
Creatinine, Ser: 0.84 mg/dL (ref 0.44–1.00)
GFR, Estimated: >60 mL/min (ref >60)
Glucose, Bld: 102 mg/dL — ABNORMAL HIGH (ref 70–99)
Potassium: 4.2 mmol/L (ref 3.5–5.1)
Sodium: 137 mmol/L (ref 135–145)

## 2023-01-26 LAB — RAPID URINE DRUG SCREEN, HOSP PERFORMED
Amphetamines: POSITIVE — AB
Barbiturates: NOT DETECTED
Benzodiazepines: POSITIVE — AB
Cocaine: NOT DETECTED
Opiates: NOT DETECTED
Tetrahydrocannabinol: NOT DETECTED

## 2023-01-26 LAB — MAGNESIUM: Magnesium: 2.1 mg/dL (ref 1.7–2.4)

## 2023-01-26 MED ORDER — TECHNETIUM TC 99M TETROFOSMIN IV KIT
30.0000 | PACK | Freq: Once | INTRAVENOUS | Status: AC | PRN
  Administered 2023-01-26: 31 via INTRAVENOUS

## 2023-01-26 MED ORDER — BUTALBITAL-APAP-CAFFEINE 50-325-40 MG PO TABS
2.0000 | ORAL_TABLET | Freq: Once | ORAL | Status: AC
  Administered 2023-01-26: 2 via ORAL
  Filled 2023-01-26: qty 2

## 2023-01-26 MED ORDER — RANOLAZINE ER 500 MG PO TB12: 500.0000 mg | ORAL_TABLET | Freq: Two times a day (BID) | ORAL | 1 refills | Status: DC

## 2023-01-26 MED ORDER — SODIUM CHLORIDE FLUSH 0.9 % IV SOLN
INTRAVENOUS | Status: AC
  Administered 2023-01-26: 10 mL via INTRAVENOUS
  Filled 2023-01-26: qty 10

## 2023-01-26 MED ORDER — RANOLAZINE ER 500 MG PO TB12
500.0000 mg | ORAL_TABLET | Freq: Two times a day (BID) | ORAL | Status: DC
  Administered 2023-01-26: 500 mg via ORAL
  Filled 2023-01-26: qty 1

## 2023-01-26 MED ORDER — REGADENOSON 0.4 MG/5ML IV SOLN
INTRAVENOUS | Status: AC
  Administered 2023-01-26: 0.4 mg via INTRAVENOUS
  Filled 2023-01-26: qty 5

## 2023-01-26 NOTE — Plan of Care (Signed)
?  Problem: Education: ?Goal: Knowledge of General Education information will improve ?Description: Including pain rating scale, medication(s)/side effects and non-pharmacologic comfort measures ?Outcome: Progressing ?  ?Problem: Health Behavior/Discharge Planning: ?Goal: Ability to manage health-related needs will improve ?Outcome: Progressing ?  ?Problem: Clinical Measurements: ?Goal: Will remain free from infection ?Outcome: Progressing ?  ?

## 2023-01-26 NOTE — Discharge Summary (Signed)
Physician Discharge Summary  Theresa [name] DOB: [birth year] DOA: 01/24/2023  PCP: Babs Sciara, MD  Admit date: 01/24/2023  Discharge date: 01/26/2023  Admitted From:Home  Disposition:  Home  Recommendations for Outpatient Follow-up:  Follow up with PCP in 1-2 weeks Follow-up with cardiology as indicated 10/31 Continue on Ranexa 500 mg twice daily Hold home blood pressure medications and monitor blood pressures at home closely Continue other home medications as prior  Home Health: None  Equipment/Devices: None  Discharge Condition:Stable  CODE STATUS: Full  Diet recommendation: Heart Healthy  Brief/Interim Summary:  Theresa [name] is a 52 y.o. female with medical history significant of GERD, hypertension, migraine headache, obesity who presents to the emergency department due to stress-induced chest pain.  She was admitted for chest pain with typical and atypical features and underwent Lexiscan stress test as well as 2D echocardiogram with no findings of ischemia and LVEF 60-65% with no wall motion abnormalities respectively.  Plan is to remain on Ranexa as prescribed and discontinue aspirin as well as blood pressure medications.  Plan to follow-up on 10/31 with cardiology.  No other acute events or concerns noted throughout the course of this admission.  Discharge Diagnoses:  Principal Problem:   Atypical chest pain Active Problems:   GERD without esophagitis   Essential hypertension   Elevated troponin   Migraine headache  Principal discharge diagnosis: Atypical chest pain likely related to stress/anxiety.  Discharge Instructions  Discharge Instructions     Diet - low sodium heart healthy   Complete by: As directed    Increase activity slowly   Complete by: As directed       Allergies as of 01/26/2023       Reactions   Fentanyl Other (See Comments)   Doesn't like the way it feels    Prednisone    Makes her feel bad, side effects         Medication List     STOP taking these medications    lisinopril-hydrochlorothiazide 20-25 MG tablet Commonly known as: ZESTORETIC       TAKE these medications    albuterol 108 (90 Base) MCG/ACT inhaler Commonly known as: VENTOLIN HFA Inhale 2 puffs into the lungs every 6 (six) hours as needed for wheezing or shortness of breath.   amphetamine-dextroamphetamine 20 MG 24 hr capsule Commonly known as: ADDERALL XR Take 1 capsule (20 mg total) by mouth every morning.   cholecalciferol 25 MCG (1000 UNIT) tablet Commonly known as: VITAMIN D3 Take 1,000 Units by mouth daily.   dexlansoprazole 60 MG capsule Commonly known as: DEXILANT TAKE ONE CAPSULE BY MOUTH ONCE DAILY   magnesium oxide 400 MG tablet Commonly known as: MAG-OX daily.   ondansetron 8 MG tablet Commonly known as: ZOFRAN TAKE ONE TABLET BY MOUTH THREE TIMES DAILY AS NEEDED FOR NAUSEA   ranolazine 500 MG 12 hr tablet Commonly known as: RANEXA Take 1 tablet (500 mg total) by mouth 2 (two) times daily.   tiZANidine 4 MG tablet Commonly known as: Zanaflex Take 1 tablet (4 mg total) by mouth every 8 (eight) hours as needed for muscle spasms.   Zepbound 10 MG/0.5ML Pen Generic drug: tirzepatide Inject 10 mg into the skin once a week.        Follow-up Information     Furth, Cadence H, PA-C Follow up on 03/03/2023.   Specialty: Cardiology [facility]: [facility] Follow-up on 03/03/2023 at 3:30 PM. Contact information: [address] [place] [address] [phone]  Babs Sciara, MD. Schedule an appointment as soon as possible for a visit in 1 week(s).   Specialty: Family Medicine Contact information: [address] [place] [address] [phone]                Allergies  Allergen Reactions   Fentanyl Other (See Comments)    Doesn't like the way it feels    Prednisone     Makes her feel bad, side effects     Consultations: Cardiology   Procedures/Studies: NM Myocar Multi W/Spect W/Wall Motion / EF  Result Date: 01/26/2023   The study is normal. There are no perfusion defects. The study is low risk.   No ST deviation was noted.   LV perfusion is normal.   Left ventricular function is normal. Nuclear stress EF: 74%. The left ventricular ejection fraction is hyperdynamic (>65%). End diastolic cavity size is normal.   ECHOCARDIOGRAM COMPLETE  Result Date: 01/25/2023    ECHOCARDIOGRAM REPORT   [name] Date of Exam: 01/25/2023 Medical Rec #:  [id number]       Height:       65.0 in Accession #:    [id number]      Weight:       196.0 lb Date of Birth:  1970/06/10       BSA:          1.961 m Patient Age:    52 years        BP:           100/67 mmHg Patient Gender: F               HR:           74 bpm. [facility] Procedure: 2D Echo, Cardiac Doppler, Color Doppler and Strain Analysis Indications:    Chest Pain  History:        Patient has no prior history of Echocardiogram examinations.                 Signs/Symptoms:Chest Pain, Syncope and                 Dizziness/Lightheadedness; Risk Factors:Hypertension.  Sonographer:    Mikki Harbor Referring Phys: [id number] [facility]  Sonographer Comments: Global longitudinal strain was attempted. IMPRESSIONS  1. Left ventricular ejection fraction, by estimation, is 55 to 60%. The left ventricle has normal function. The left ventricle has no regional wall motion abnormalities. Left ventricular diastolic parameters were normal. The average left ventricular global longitudinal strain is -18.1 %. The global longitudinal strain is normal.  2. Right ventricular systolic function is normal. The right ventricular size is normal. There is normal pulmonary artery systolic pressure.  3. The mitral valve is normal in structure. Trivial mitral valve regurgitation. No evidence of mitral stenosis.  4. The tricuspid valve is abnormal.  5. The  aortic valve is tricuspid. Aortic valve regurgitation is not visualized. No aortic stenosis is present.  6. The inferior vena cava is normal in size with greater than 50% respiratory variability, suggesting right atrial pressure of 3 mmHg. FINDINGS  Left Ventricle: Left ventricular ejection fraction, by estimation, is 55 to 60%. The left ventricle has normal function. The left ventricle has no regional wall motion abnormalities. The average left ventricular global longitudinal strain is -18.1 %. The global longitudinal strain is normal. The left ventricular internal cavity size was normal in size. There is no left ventricular hypertrophy. Left  ventricular diastolic parameters were normal. Right Ventricle: The right ventricular size is normal. Right vetricular wall thickness was not well visualized. Right ventricular systolic function is normal. There is normal pulmonary artery systolic pressure. The tricuspid regurgitant velocity is 2.14 m/s, and with an assumed right atrial pressure of 3 mmHg, the estimated right ventricular systolic pressure is 21.3 mmHg. Left Atrium: Left atrial size was normal in size. Right Atrium: Right atrial size was normal in size. Pericardium: There is no evidence of pericardial effusion. Mitral Valve: The mitral valve is normal in structure. Trivial mitral valve regurgitation. No evidence of mitral valve stenosis. MV peak gradient, 2.4 mmHg. The mean mitral valve gradient is 1.0 mmHg. Tricuspid Valve: The tricuspid valve is abnormal. Tricuspid valve regurgitation is mild . No evidence of tricuspid stenosis. Aortic Valve: The aortic valve is tricuspid. Aortic valve regurgitation is not visualized. No aortic stenosis is present. Aortic valve mean gradient measures 5.0 mmHg. Aortic valve peak gradient measures 9.7 mmHg. Aortic valve area, by VTI measures 2.72 cm. Pulmonic Valve: The pulmonic valve was not well visualized. Pulmonic valve regurgitation is not visualized. No evidence of  pulmonic stenosis. Aorta: The aortic root is normal in size and structure. Venous: The inferior vena cava is normal in size with greater than 50% respiratory variability, suggesting right atrial pressure of 3 mmHg. IAS/Shunts: No atrial level shunt detected by color flow Doppler.  LEFT VENTRICLE PLAX 2D LVIDd:         4.00 cm   Diastology LVIDs:         2.80 cm   LV e' medial:    9.57 cm/s LV PW:         1.00 cm   LV E/e' medial:  8.3 LV IVS:        1.00 cm   LV e' lateral:   14.40 cm/s LVOT diam:     2.00 cm   LV E/e' lateral: 5.5 LV SV:         90 LV SV Index:   46        2D Longitudinal Strain LVOT Area:     3.14 cm  2D Strain GLS Avg:     -18.1 %  RIGHT VENTRICLE RV Basal diam:  3.00 cm RV Mid diam:    2.90 cm RV S prime:     11.60 cm/s TAPSE (M-mode): 2.3 cm LEFT ATRIUM             Index        RIGHT ATRIUM           Index LA diam:        3.70 cm 1.89 cm/m   RA Area:     14.50 cm LA Vol (A2C):   39.0 ml 19.89 ml/m  RA Volume:   31.60 ml  16.11 ml/m LA Vol (A4C):   35.7 ml 18.20 ml/m LA Biplane Vol: 39.8 ml 20.29 ml/m  AORTIC VALVE                    PULMONIC VALVE AV Area (Vmax):    2.40 cm     PV Vmax:       0.86 m/s AV Area (Vmean):   2.48 cm     PV Peak grad:  2.9 mmHg AV Area (VTI):     2.72 cm AV Vmax:           156.00 cm/s AV Vmean:          99.800 cm/s  AV VTI:            0.329 m AV Peak Grad:      9.7 mmHg AV Mean Grad:      5.0 mmHg LVOT Vmax:         119.00 cm/s LVOT Vmean:        78.700 cm/s LVOT VTI:          0.285 m LVOT/AV VTI ratio: 0.87  AORTA Ao Root diam: 3.30 cm MITRAL VALVE               TRICUSPID VALVE MV Area (PHT): 3.89 cm    TR Peak grad:   18.3 mmHg MV Area VTI:   4.05 cm    TR Vmax:        214.00 cm/s MV Peak grad:  2.4 mmHg MV Mean grad:  1.0 mmHg    SHUNTS MV Vmax:       0.78 m/s    Systemic VTI:  0.29 m MV Vmean:      47.0 cm/s   Systemic Diam: 2.00 cm MV Decel Time: 195 msec MV E velocity: 79.20 cm/s MV A velocity: 62.40 cm/s MV E/A ratio:  1.27 Dina Rich MD  Electronically signed by Dina Rich MD Signature Date/Time: 01/25/2023/10:59:13 AM    Final    DG Chest 2 View  Result Date: 01/20/2023 CLINICAL DATA:  Cough and shortness of breath EXAM: CHEST - 2 VIEW COMPARISON:  Chest x-ray 10/28/2022 FINDINGS: The heart size and mediastinal contours are within normal limits. Both lungs are clear. The visualized skeletal structures are unremarkable. IMPRESSION: No active cardiopulmonary disease. Electronically Signed   By: Darliss Cheney M.D.   On: 01/20/2023 19:55     Discharge Exam: Vitals:   01/26/23 0859 01/26/23 0900  BP: 96/65   Pulse: 81   Resp:    Temp: 97.9 F (36.6 C)   SpO2:  98%   Vitals:   01/25/23 1900 01/26/23 0408 01/26/23 0859 01/26/23 0900  BP: (!) 84/62 (!) 87/54 96/65   Pulse: 86 84 81   Resp:      Temp: 98.4 F (36.9 C) 98.1 F (36.7 C) 97.9 F (36.6 C)   TempSrc: Oral Oral Oral   SpO2: 97% 97%  98%  Weight:      Height:        General: Pt is alert, awake, not in acute distress Cardiovascular: RRR, S1/S2 +, no rubs, no gallops Respiratory: CTA bilaterally, no wheezing, no rhonchi Abdominal: Soft, NT, ND, bowel sounds + Extremities: no edema, no cyanosis    The results of significant diagnostics from this hospitalization (including imaging, microbiology, ancillary and laboratory) are listed below for reference.     Microbiology: No results found for this or any previous visit (from the past 240 hour(s)).   Labs: BNP (last 3 results) No results for input(s): "BNP" in the last 8760 hours. Basic Metabolic Panel: Recent Labs  Lab 01/24/23 2018 01/24/23 2141 01/25/23 0501 01/26/23 0418  NA 137  --  139 137  K 2.9*  --  3.4* 4.2  CL 105  --  106 105  CO2 19*  --  23 24  GLUCOSE 129*  --  98 102*  BUN 13  --  10 10  CREATININE 0.93  --  0.71 0.84  CALCIUM 9.6  --  8.5* 8.7*  MG  --  1.9  --  2.1   Liver Function Tests: Recent Labs  Lab 01/25/23 0501  AST 23  ALT 33  ALKPHOS 59  BILITOT 0.8   PROT 6.2*  ALBUMIN 3.4*   No results for input(s): "LIPASE", "AMYLASE" in the last 168 hours. No results for input(s): "AMMONIA" in the last 168 hours. CBC: Recent Labs  Lab 01/24/23 2018 01/25/23 0501 01/26/23 0418  WBC 9.6 6.2 5.2  HGB 13.1 11.0* 11.2*  HCT 37.7 31.8* 34.8*  MCV 88.5 88.6 92.3  PLT 293 237 234   Cardiac Enzymes: No results for input(s): "CKTOTAL", "CKMB", "CKMBINDEX", "TROPONINI" in the last 168 hours. BNP: Invalid input(s): "POCBNP" CBG: No results for input(s): "GLUCAP" in the last 168 hours. D-Dimer No results for input(s): "DDIMER" in the last 72 hours. Hgb A1c Recent Labs    01/25/23 0526  HGBA1C 5.5   Lipid Profile Recent Labs    01/25/23 0715  CHOL 126  HDL 43  LDLCALC 74  TRIG 44  CHOLHDL 2.9   Thyroid function studies No results for input(s): "TSH", "T4TOTAL", "T3FREE", "THYROIDAB" in the last 72 hours.  Invalid input(s): "FREET3" Anemia work up No results for input(s): "VITAMINB12", "FOLATE", "FERRITIN", "TIBC", "IRON", "RETICCTPCT" in the last 72 hours. Urinalysis    Component Value Date/Time   COLORURINE YELLOW 12/18/2019 1327   APPEARANCEUR CLEAR 12/18/2019 1327   LABSPEC 1.016 12/18/2019 1327   PHURINE 6.0 12/18/2019 1327   GLUCOSEU NEGATIVE 12/18/2019 1327   HGBUR NEGATIVE 12/18/2019 1327   BILIRUBINUR Negative 05/26/2021 1652   KETONESUR 5 (A) 12/18/2019 1327   PROTEINUR Negative 05/26/2021 1652   PROTEINUR 30 (A) 12/18/2019 1327   UROBILINOGEN 0.2 05/26/2021 1652   NITRITE Positive 05/26/2021 1652   NITRITE NEGATIVE 12/18/2019 1327   LEUKOCYTESUR Trace (A) 05/26/2021 1652   LEUKOCYTESUR NEGATIVE 12/18/2019 1327   Sepsis Labs Recent Labs  Lab 01/24/23 2018 01/25/23 0501 01/26/23 0418  WBC 9.6 6.2 5.2   Microbiology No results found for this or any previous visit (from the past 240 hour(s)).   Time coordinating discharge: 35 minutes  SIGNED:   Erick Blinks, DO [facility] 01/26/2023, 10:28  AM  If 7PM-7AM, please contact night-coverage [email or web address]

## 2023-01-26 NOTE — Progress Notes (Addendum)
Rounding Note    [name] Date of Encounter: 01/26/2023  [facility] Cardiologist: Dr. Wyline Mood  Subjective   Evaluated at the time of her stress test. No recurrent chest pain overnight or this morning. Biggest issue at this time is a headache (reports having migraine headaches in the past).   Inpatient Medications    Scheduled Meds:  aspirin EC  81 mg Oral Daily   enoxaparin (LOVENOX) injection  40 mg Subcutaneous Q24H   pantoprazole  40 mg Oral Daily   rosuvastatin  20 mg Oral Daily   Continuous Infusions:  PRN Meds: acetaminophen **OR** acetaminophen, ondansetron **OR** ondansetron (ZOFRAN) IV   Vital Signs    Vitals:   01/25/23 0810 01/25/23 1222 01/25/23 1900 01/26/23 0408  BP: (!) 85/56 98/63 (!) 84/62 (!) 87/54  Pulse: 76 78 86 84  Resp: 16 16    Temp: 98.3 F (36.8 C) 98.5 F (36.9 C) 98.4 F (36.9 C) 98.1 F (36.7 C)  TempSrc: Oral  Oral Oral  SpO2: 98% 96% 97% 97%  Weight:      Height:        Intake/Output Summary (Last 24 hours) at 01/26/2023 0806 Last data filed at 01/26/2023 0300 Gross per 24 hour  Intake 290 ml  Output 300 ml  Net -10 ml      01/25/2023    1:01 AM 01/24/2023    7:55 PM 12/28/2022    2:00 PM  Last 3 Weights  Weight (lbs) 195 lb 15.8 oz 191 lb 198 lb  Weight (kg) 88.9 kg 86.637 kg 89.812 kg      Telemetry    NSR, HR in 70's to 80's.  - Personally Reviewed  ECG    No new tracings.   Physical Exam   GEN: No acute distress.   Neck: No JVD Cardiac: RRR, no murmurs, rubs, or gallops.  Respiratory: Clear to auscultation bilaterally. GI: Soft, nontender, non-distended  MS: No pitting edema; No deformity. Neuro:  Nonfocal  Psych: Normal affect   Labs    High Sensitivity Troponin:   Recent Labs  Lab 01/24/23 2018 01/24/23 2141 01/25/23 0501 01/25/23 0708  TROPONINIHS 16 36* 33* 25*     Chemistry Recent Labs  Lab 01/24/23 2018 01/24/23 2141 01/25/23 0501 01/26/23 0418  NA  137  --  139 137  K 2.9*  --  3.4* 4.2  CL 105  --  106 105  CO2 19*  --  23 24  GLUCOSE 129*  --  98 102*  BUN 13  --  10 10  CREATININE 0.93  --  0.71 0.84  CALCIUM 9.6  --  8.5* 8.7*  MG  --  1.9  --  2.1  PROT  --   --  6.2*  --   ALBUMIN  --   --  3.4*  --   AST  --   --  23  --   ALT  --   --  33  --   ALKPHOS  --   --  59  --   BILITOT  --   --  0.8  --   GFRNONAA >60  --  >60 >60  ANIONGAP 13  --  10 8    Lipids  Recent Labs  Lab 01/25/23 0715  CHOL 126  TRIG 44  HDL 43  LDLCALC 74  CHOLHDL 2.9    Hematology Recent Labs  Lab 01/24/23 2018 01/25/23 0501 01/26/23 0418  WBC  9.6 6.2 5.2  RBC 4.26 3.59* 3.77*  HGB 13.1 11.0* 11.2*  HCT 37.7 31.8* 34.8*  MCV 88.5 88.6 92.3  MCH 30.8 30.6 29.7  MCHC 34.7 34.6 32.2  RDW 12.5 12.7 13.1  PLT 293 237 234   Thyroid No results for input(s): "TSH", "FREET4" in the last 168 hours.  BNPNo results for input(s): "BNP", "PROBNP" in the last 168 hours.  DDimer No results for input(s): "DDIMER" in the last 168 hours.    Cardiac Studies   Echocardiogram: 01/25/2023 IMPRESSIONS     1. Left ventricular ejection fraction, by estimation, is 55 to 60%. The  left ventricle has normal function. The left ventricle has no regional  wall motion abnormalities. Left ventricular diastolic parameters were  normal. The average left ventricular  global longitudinal strain is -18.1 %. The global longitudinal strain is  normal.   2. Right ventricular systolic function is normal. The right ventricular  size is normal. There is normal pulmonary artery systolic pressure.   3. The mitral valve is normal in structure. Trivial mitral valve  regurgitation. No evidence of mitral stenosis.   4. The tricuspid valve is abnormal.   5. The aortic valve is tricuspid. Aortic valve regurgitation is not  visualized. No aortic stenosis is present.   6. The inferior vena cava is normal in size with greater than 50%  respiratory variability,  suggesting right atrial pressure of 3 mmHg.    Patient Profile     52 y.o. female w/ PMH of chest pain, GERD, fibromyalgia, depression, anxiety, seizures and migraine headaches who is currently admitted for evaluation of chest pain.   Assessment & Plan    1. Chest Pain with Mixed Features - Presented with chest pain and shortness of breath which occurred after finding out she lost her job. Pain typically worse in the past when stressed or anxious but never underwent cardiac evaluation. Also reported worsening dyspnea on exertion for several weeks and episodes of chest tightness with climbing steps which was more concerning for a cardiac etiology.  - Hs Troponin values have been mildly elevated at 16, 36, 33 and 25. Echo yesterday showed a preserved EF of 55-60% with no regional WMA and no significant valve abnormalities.  - A Lexiscan Myoview was recommended for further evaluation and resting pictures were obtained yesterday but stress portion unable to be obtained due to facility issues. Underwent stress portion this morning with the official report pending.   2. HTN - BP has been soft with SBP in the 90's at the time of her stress test. PTA Lisinopril-Hydrochlorothiazide currently held and would continue to hold.    For questions or updates, please contact [facility] Please consult [email or web address] for contact info under        Signed, Ellsworth Lennox, PA-C  01/26/2023, 8:06 AM    Attending note Patient seen and discussed with PA Iran Ouch, I agree with her documentation  1.Chest pain - - history of chest pain. Stress tests ordered from [facility] in 2020 and 2021 but never completed - current symptoms are mixed with some typical and atypical features. Does have some exertional symptoms.  - mild trop though in a crescendo/decrescendo pattern. Presented hypertensive, tachycardic. - echo shows LVEF 60-65%, no WMAs - lexiscan shows no ischemia  - no evidence of  obstructive disease. Symptoms may be all stress/anxiety related. Cannot exclude possible vasospasm or microvascular disease. Soft bp's would limit a trial of nitrates or CCB. Trial of ranexa 500mg  bid.  -  can d/c ASA - ASCVD 10 year  risk is low <5%, LDL was 74. Statin not indicated at this time, will discontinue crestor started this admission.    2. HTN - soft bps this admit, would not resume home bp meds at discharge  [facility] for discharge, we will arrange outpatient f/u   Dina Rich MD

## 2023-01-27 ENCOUNTER — Telehealth: Payer: Self-pay

## 2023-01-27 NOTE — Transitions of Care (Post Inpatient/ED Visit) (Signed)
01/27/2023  Name: Theresa [name] MRN: [id number] DOB: 1970/11/05  Today's TOC FU Call Status: Today's TOC FU Call Status:: Successful TOC FU Call Completed TOC FU Call Complete Date: 01/27/23 Patient's Name and Date of Birth confirmed.  Transition Care Management Follow-up Telephone Call Date of Discharge: 01/26/23 [facility] Type of Discharge: Inpatient Admission Primary Inpatient Discharge Diagnosis:: abn blood chem How have you been since you were released from the hospital?: Better Any questions or concerns?: No  Items Reviewed: Did you receive and understand the discharge instructions provided?: Yes Medications obtained,verified, and reconciled?: Yes (Medications Reviewed) Any new allergies since your discharge?: No Dietary orders reviewed?: Yes Do you have support at home?: No  Medications Reviewed Today: Medications Reviewed Today     Reviewed by Karena Addison, LPN (Licensed Practical Nurse) on 01/27/23 at 1214  Med List Status: <None>   Medication Order Taking? Sig Documenting Provider Last Dose Status Informant  albuterol (VENTOLIN HFA) 108 (90 Base) MCG/ACT inhaler [id number] No Inhale 2 puffs into the lungs every 6 (six) hours as needed for wheezing or shortness of breath. Campbell Riches, NP 01/24/2023 Active Self, Pharmacy Records  amphetamine-dextroamphetamine (ADDERALL XR) 20 MG 24 hr capsule [id number] No Take 1 capsule (20 mg total) by mouth every morning. Campbell Riches, NP 01/24/2023 Active Self, Pharmacy Records  cholecalciferol (VITAMIN D3) 25 MCG (1000 UNIT) tablet [id number] No Take 1,000 Units by mouth daily. [provider] 01/24/2023 Active Self, Pharmacy Records  dexlansoprazole [facility]) 60 MG capsule [id number] No TAKE ONE CAPSULE BY MOUTH ONCE DAILY Corbin Ade, MD 01/24/2023 Active Self, Pharmacy Records  magnesium oxide (MAG-OX) 400 MG tablet [id number] No daily. [provider] 01/24/2023 Active  Self, Pharmacy Records  ondansetron (ZOFRAN) 8 MG tablet [id number] No TAKE ONE TABLET BY MOUTH THREE TIMES DAILY AS NEEDED FOR NAUSEA Gelene Mink, NP unknown Active Self, Pharmacy Records  ranolazine (RANEXA) 500 MG 12 hr tablet [id number]  Take 1 tablet (500 mg total) by mouth 2 (two) times daily. Sherryll Burger, Pratik D, DO  Active   tirzepatide [facility]) 10 MG/0.5ML Pen [id number] No Inject 10 mg into the skin once a week. Campbell Riches, NP 01/19/2023 Active Self, Pharmacy Records  tiZANidine (ZANAFLEX) 4 MG tablet [id number]  Take 1 tablet (4 mg total) by mouth every 8 (eight) hours as needed for muscle spasms. Leath-Warren, Sadie Haber, NP  Active Self, Pharmacy Records           Med Note Mayford Knife, DAWN S   Tue Jan 25, 2023  7:47 AM) Pt has not started yet.            Home Care and Equipment/Supplies: Were Home Health Services Ordered?: NA Any new equipment or medical supplies ordered?: NA  Functional Questionnaire: Do you need assistance with bathing/showering or dressing?: No Do you need assistance with meal preparation?: No Do you need assistance with eating?: No Do you have difficulty maintaining continence: No Do you need assistance with getting out of bed/getting out of a chair/moving?: No Do you have difficulty managing or taking your medications?: No  Follow up appointments reviewed: PCP Follow-up appointment confirmed?: Yes Date of PCP follow-up appointment?: 01/28/23 Follow-up Provider: [facility] Follow-up appointment confirmed?: NA Do you need transportation to your follow-up appointment?: No Do you understand care options if your condition(s) worsen?: Yes-patient verbalized understanding    SIGNATURE Karena Addison, LPN [facility] Nurse Health Advisor Direct Dial [phone]

## 2023-01-28 ENCOUNTER — Encounter: Payer: Self-pay | Admitting: Nurse Practitioner

## 2023-01-28 ENCOUNTER — Ambulatory Visit: Payer: BC Managed Care – PPO | Admitting: Nurse Practitioner

## 2023-01-28 VITALS — BP 99/69 | HR 67 | Temp 97.9°F | Ht 65.0 in | Wt 198.0 lb

## 2023-01-28 DIAGNOSIS — F419 Anxiety disorder, unspecified: Secondary | ICD-10-CM | POA: Diagnosis not present

## 2023-01-28 DIAGNOSIS — R300 Dysuria: Secondary | ICD-10-CM | POA: Diagnosis not present

## 2023-01-28 DIAGNOSIS — F32A Depression, unspecified: Secondary | ICD-10-CM

## 2023-01-28 DIAGNOSIS — Z6835 Body mass index (BMI) 35.0-35.9, adult: Secondary | ICD-10-CM

## 2023-01-28 DIAGNOSIS — R350 Frequency of micturition: Secondary | ICD-10-CM | POA: Diagnosis not present

## 2023-01-28 LAB — POCT URINALYSIS DIP (CLINITEK)
Bilirubin, UA: NEGATIVE
Glucose, UA: NEGATIVE mg/dL
Ketones, POC UA: NEGATIVE mg/dL
Nitrite, UA: POSITIVE — AB
Spec Grav, UA: 1.025 (ref 1.010–1.025)
Urobilinogen, UA: 0.2 U/dL
pH, UA: 6.5 (ref 5.0–8.0)

## 2023-01-28 MED ORDER — CEFDINIR 300 MG PO CAPS: 300.0000 mg | ORAL_CAPSULE | Freq: Two times a day (BID) | ORAL | 0 refills | Status: DC

## 2023-01-28 MED ORDER — ESCITALOPRAM OXALATE 10 MG PO TABS
10.0000 mg | ORAL_TABLET | Freq: Every day | ORAL | 0 refills | Status: DC
Start: 2023-01-28 — End: 2023-03-29

## 2023-01-28 MED ORDER — CLONAZEPAM 0.5 MG PO TABS: ORAL_TABLET | ORAL | 0 refills | Status: DC

## 2023-01-28 MED ORDER — ZEPBOUND 12.5 MG/0.5ML ~~LOC~~ SOAJ: 12.5000 mg | SUBCUTANEOUS | 2 refills | Status: DC

## 2023-01-28 NOTE — Progress Notes (Signed)
Subjective:    Patient ID: Theresa [name], female    DOB: Feb 08, 1971, 52 y.o.   MRN: [id number]  HPI  Hospital follow up for chest pain - recent job loss Her car workup was normal.  Patient attributes all of her symptoms to recent loss of her job.  Very unexpected and patient feels she was wrongfully terminated.  Financially can go short-term.  Still dealing with the emotional issues regarding her job.  Anxiety has been extremely bad lately.  Some panic attacks.  Has gone back to the extreme emotion she had back when she was divorcing her husband.  Will have elevation in BP and pulse and feels like she has trouble getting her breath with the panic attacks.  Has taken Lexapro in the past with good results. Would like to increase her Zepbound to the next dose.  Seeing a dietitian.  Trying to have healthy lifestyle habits despite all of her stress.  Minimal nausea.  Slight constipation. Also complaints of urinary urgency , strong odor for the past week.  Dysuria.  Urgency, frequency.  No fever.  No new sexual partners.  No vaginal discharge.  No pelvic pain.  No recent UTI.  Review of Systems  Constitutional:  Positive for fatigue. Negative for fever.  HENT:  Negative for sore throat and trouble swallowing.   Respiratory:  Positive for shortness of breath. Negative for cough, chest tightness and wheezing.   Cardiovascular:  Positive for palpitations. Negative for chest pain and leg swelling.      01/28/2023    1:41 PM  Depression screen PHQ 2/9  Decreased Interest 2  Down, Depressed, Hopeless 3  PHQ - 2 Score 5  Altered sleeping 3  Tired, decreased energy 3  Change in appetite 0  Feeling bad or failure about yourself  3  Trouble concentrating 3  Moving slowly or fidgety/restless 0  Suicidal thoughts 0  PHQ-9 Score 17  Difficult doing work/chores Very difficult      01/28/2023    1:42 PM 11/12/2022    4:23 PM 07/16/2022    4:24 PM 06/07/2022    9:14 AM  GAD 7 : Generalized Anxiety  Score  Nervous, Anxious, on Edge 3 1 0 2  Control/stop worrying 3 2 0 3  Worry too much - different things 3 2 0 3  Trouble relaxing 3 2 1 2   Restless 3 0 0 1  Easily annoyed or irritable 3 2 1 2   Afraid - awful might happen 0 0 1 2  Total GAD 7 Score 18 9 3 15   Anxiety Difficulty Very difficult Somewhat difficult Not difficult at all          .  Physical Exam NAD.  Alert, oriented.  Fatigued in appearance.  Making good eye contact.  Speech clear.  Dressed appropriately for the weather.  Thoughts logical coherent and relevant.  Normal behavior and mood.  Tearful at times.  Thyroid nontender to palpation, no mass or goiter noted.  Lungs clear.  Heart regular rate rhythm.  No CVA tenderness.  Abdomen soft nondistended nontender. Today's Vitals   01/28/23 1018  BP: 99/69  Pulse: 67  Temp: 97.9 F (36.6 C)  SpO2: 99%  Weight: 198 lb (89.8 kg)  Height: 5\' 5"  (1.651 m)   Body mass index is 32.95 kg/m. Results for orders placed or performed in visit on 01/28/23  POCT URINALYSIS DIP (CLINITEK)  Result Value Ref Range   Color, UA yellow yellow   Clarity,  UA cloudy (A) clear   Glucose, UA negative negative mg/dL   Bilirubin, UA negative negative   Ketones, POC UA negative negative mg/dL   Spec Grav, UA 6.295 2.841 - 1.025   Blood, UA small (A) negative   pH, UA 6.5 5.0 - 8.0   POC PROTEIN,UA trace negative, trace   Urobilinogen, UA 0.2 0.2 or 1.0 E.U./dL   Nitrite, UA Positive (A) Negative   Leukocytes, UA Moderate (2+) (A) Negative          Assessment & Plan:   Problem List Items Addressed This Visit       Other   Anxiety and depression - Primary   Relevant Medications   escitalopram (LEXAPRO) 10 MG tablet   Class 2 severe obesity due to excess calories with serious comorbidity and body mass index (BMI) of 35.0 to 35.9 in adult [facility])   Relevant Medications   tirzepatide (ZEPBOUND) 12.5 MG/0.5ML Pen   Other Visit Diagnoses     Dysuria       Relevant Orders    POCT URINALYSIS DIP (CLINITEK) (Completed)   Urine Culture      Meds ordered this encounter  Medications   cefdinir (OMNICEF) 300 MG capsule    Sig: Take 1 capsule (300 mg total) by mouth 2 (two) times daily.    Dispense:  14 capsule    Refill:  0    Order Specific Question:   Supervising Provider    Answer:   Lilyan Punt A [[id number]]   escitalopram (LEXAPRO) 10 MG tablet    Sig: Take 1 tablet (10 mg total) by mouth daily.    Dispense:  90 tablet    Refill:  0    Order Specific Question:   Supervising Provider    Answer:   Lilyan Punt A [[id number]]   clonazePAM (KLONOPIN) 0.5 MG tablet    Sig: Take 1/2 - 1 tab po BID for extreme anxiety    Dispense:  30 tablet    Refill:  0    Order Specific Question:   Supervising Provider    Answer:   Lilyan Punt A [[id number]]   tirzepatide (ZEPBOUND) 12.5 MG/0.5ML Pen    Sig: Inject 12.5 mg into the skin once a week.    Dispense:  2 mL    Refill:  2    Order Specific Question:   Supervising Provider    Answer:   Babs Sciara [id number]   Start cefdinir as directed for probable UTI.  Urine culture pending.  Go to ED or urgent care over the weekend if symptoms worsen otherwise further follow-up based on culture results. Increase Zepbound dose to 12.5 mg weekly.  Again reviewed potential adverse effects.  Notify office if any problems with new dosing. Lengthy discussion regarding stress reduction and anxiety.  Restart escitalopram 10 mg daily.  Has been taking a rare Klonopin, refill given on this medication for extreme anxiety. Defers flu vaccine today. Return in about 3 months (around 04/29/2023). Call back sooner if needed.

## 2023-02-02 LAB — URINE CULTURE

## 2023-02-09 ENCOUNTER — Ambulatory Visit (INDEPENDENT_AMBULATORY_CARE_PROVIDER_SITE_OTHER): Payer: BC Managed Care – PPO | Admitting: Family Medicine

## 2023-02-15 ENCOUNTER — Other Ambulatory Visit: Payer: Self-pay | Admitting: Adult Health

## 2023-02-15 ENCOUNTER — Other Ambulatory Visit: Payer: Self-pay | Admitting: Internal Medicine

## 2023-02-15 NOTE — Telephone Encounter (Signed)
Pt needs an ov. Last one was Feb. Was to return 6 to 8 weeks.

## 2023-02-18 ENCOUNTER — Other Ambulatory Visit: Payer: Self-pay | Admitting: Adult Health

## 2023-02-25 ENCOUNTER — Encounter: Payer: Self-pay | Admitting: Nurse Practitioner

## 2023-03-03 ENCOUNTER — Ambulatory Visit: Payer: Medicaid Other | Attending: Medical | Admitting: Medical

## 2023-03-03 ENCOUNTER — Encounter: Payer: Self-pay | Admitting: Medical

## 2023-03-03 VITALS — BP 124/76 | HR 77 | Ht 65.0 in | Wt 200.2 lb

## 2023-03-03 DIAGNOSIS — R0789 Other chest pain: Secondary | ICD-10-CM

## 2023-03-03 NOTE — Patient Instructions (Signed)
Medication Instructions:  Your physician recommends that you continue on your current medications as directed. Please refer to the Current Medication list given to you today.  *If you need a refill on your cardiac medications before your next appointment, please call your pharmacy*   Lab Work: NONE   If you have labs (blood work) drawn today and your tests are completely normal, you will receive your results only by: MyChart Message (if you have MyChart) OR A paper copy in the mail If you have any lab test that is abnormal or we need to change your treatment, we will call you to review the results.   Testing/Procedures: NONE    Follow-Up: At [facility], you and your health needs are our priority.  As part of our continuing mission to provide you with exceptional heart care, we have created designated Provider Care Teams.  These Care Teams include your primary Cardiologist (physician) and Advanced Practice Providers (APPs -  Physician Assistants and Nurse Practitioners) who all work together to provide you with the care you need, when you need it.  We recommend signing up for the patient portal called "MyChart".  Sign up information is provided on this After Visit Summary.  MyChart is used to connect with patients for Virtual Visits (Telemedicine).  Patients are able to view lab/test results, encounter notes, upcoming appointments, etc.  Non-urgent messages can be sent to your provider as well.   To learn more about what you can do with MyChart, go to [email or web address].    Your next appointment:   3 month(s)  Provider:   You will see one of the following Advanced Practice Providers on your designated Care Team:   Randall An, PA-C  Jacolyn Reedy, PA-C      Other Instructions Thank you for choosing [facility]!

## 2023-03-07 ENCOUNTER — Ambulatory Visit (INDEPENDENT_AMBULATORY_CARE_PROVIDER_SITE_OTHER): Payer: BC Managed Care – PPO | Admitting: Family Medicine

## 2023-03-07 ENCOUNTER — Encounter: Payer: Self-pay | Admitting: Nurse Practitioner

## 2023-03-07 ENCOUNTER — Other Ambulatory Visit: Payer: Self-pay | Admitting: Nurse Practitioner

## 2023-03-07 MED ORDER — TRAZODONE HCL 50 MG PO TABS: 25.0000 mg | ORAL_TABLET | Freq: Every evening | ORAL | 0 refills | Status: DC | PRN

## 2023-03-10 ENCOUNTER — Encounter: Payer: Self-pay | Admitting: Nurse Practitioner

## 2023-03-11 ENCOUNTER — Encounter: Payer: Self-pay | Admitting: *Deleted

## 2023-03-15 ENCOUNTER — Ambulatory Visit: Payer: Medicaid Other | Admitting: Gastroenterology

## 2023-03-29 ENCOUNTER — Encounter: Payer: Self-pay | Admitting: Gastroenterology

## 2023-03-29 ENCOUNTER — Ambulatory Visit (INDEPENDENT_AMBULATORY_CARE_PROVIDER_SITE_OTHER): Payer: Medicaid Other | Admitting: Gastroenterology

## 2023-03-29 VITALS — BP 134/91 | HR 81 | Temp 97.7°F | Ht 65.0 in | Wt 197.9 lb

## 2023-03-29 DIAGNOSIS — K5900 Constipation, unspecified: Secondary | ICD-10-CM

## 2023-03-29 DIAGNOSIS — K219 Gastro-esophageal reflux disease without esophagitis: Secondary | ICD-10-CM

## 2023-03-29 MED ORDER — MOTEGRITY 2 MG PO TABS: 1.0000 | ORAL_TABLET | Freq: Every day | ORAL | 3 refills | Status: DC

## 2023-03-29 NOTE — Patient Instructions (Signed)
We don't have samples of Motegrity, but I sent this to your pharmacy to take once daily with or without food. Please let me know how this works for you!  We will see you in 3 months  Please message with any concerns or no improvement!  I enjoyed seeing you again today! I value our relationship and want to provide genuine, compassionate, and quality care. You may receive a survey regarding your visit with me, and I welcome your feedback! Thanks so much for taking the time to complete this. I look forward to seeing you again.      Gelene Mink, PhD, ANP-BC [facility]

## 2023-03-29 NOTE — Progress Notes (Signed)
Gastroenterology Office Note     Primary Care Physician:  Babs Sciara, MD  Primary Gastroenterologist: Dr. Jena Gauss    Chief Complaint   Chief Complaint  Patient presents with   Constipation    Follow up on constipation. Thinks prolapse has gotten worse.    Gastroesophageal Reflux    Follow up on GERD. Takes dexilant and states that is doing well.      History of Present Illness   Theresa [name] is a 52 y.o. female presenting today with a history of chronic GERD, constipation, intermittent bouts of LLQ abdominal pain and documented sigmoid diverticulitis on CT in April 2021, history of rectocele.    Linzess did not help with constipation. Very loud stomach noises. Taking Miralax and tries to take daily but difficult to do. Having to manipulate due to prolapse. Sometimes can't get out with pushing. Wiping a lot. Using witch hazel.   Amitiza was not helpful in the past. Wants to hold off on anorectal manometry. Has been a very rough year.   Dexilant doing well.    Colonoscopy 2021: sigmoid and descending colon diverticulosis, one 3 mm polyp in cecum, one 4 mm polyp in descending colon. Hyperplastic. Surveillance due in 2028. Distant history of advanced adenoma in 2013.    2021 EGD: normal esophagus s/p dilation, normal stomach, normal duodenum.    Past Medical History:  Diagnosis Date   Anxiety    Back pain    Borderline diabetic    CFS (chronic fatigue syndrome)    Constipation    Depression    Diverticular disease    Elevated liver enzymes    Epilepsy (HCC)    Fatty liver    Fibromyalgia    Gallbladder problem    GERD (gastroesophageal reflux disease)    Headache(784.0)    Heart murmur    History of degenerative disc disease    Hyperplastic colon polyp 10/28/2019   Dr Kendell Bane recommends repeat colonoscopy in 2028.   Hypertension    IBS (irritable bowel syndrome) w/ constipation    IFG (impaired fasting glucose)    Joint pain    Kidney problem     Lactose intolerance    Migraines    Obsessive-compulsive disorder    Osteoarthritis    Seizures (HCC)    last seizure at age 88-stressed induced seizure.   SOB (shortness of breath)    Swallowing difficulty    Torn meniscus    Vitamin D deficiency     Past Surgical History:  Procedure Laterality Date   ABDOMINAL HYSTERECTOMY     still has right ovary   BIOPSY N/A 03/05/2014   Procedure: DUODENAL AND GASTRIC BIOPSY;  Surgeon: West Bali, MD;  Location: [facility];  Service: Endoscopy;  Laterality: N/A;   bladder sling X 3     CHILECTOMY Left 03/12/2020   Procedure: CHILECTOMY HALLUX;  Surgeon: Erskine Emery, DPM;  Location: [facility];  Service: Podiatry;  Laterality: Left;   CHOLECYSTECTOMY     COLONOSCOPY  04/24/2012   NUU:VOZDGUY polyp measuring 5 mm in size was found in the proximal transverse colon; polypectomy was performed with cold forceps Pedunculated polyp measuring 1.1 cm in size was found in the sigmoid colon; polypectomy was performed using snare cautery Moderate diverticulosis was noted in the descending colon and sigmoid colon small internal hemorrhoids. next tcs 04/2017   COLONOSCOPY WITH PROPOFOL N/A 10/23/2019   sigmoid and descending colon diverticulosis, one 3 mm polyp in cecum, one 4 mm  polyp in descending colon. Hyperplastic   ESOPHAGOGASTRODUODENOSCOPY N/A 02/18/2014   Procedure: ESOPHAGOGASTRODUODENOSCOPY (EGD);  Surgeon: West Bali, MD;  Location: [facility];  Service: Endoscopy;  Laterality: N/A;  115   ESOPHAGOGASTRODUODENOSCOPY (EGD) WITH PROPOFOL N/A 03/05/2014   SLF: 1. dyspepsia due to constipation, gastritis, and GERD 2. Multiple Gastric polyps 3. Moderate non-erosive gastritis 4. Diverticulum in the 2nd part of the duodenum   ESOPHAGOGASTRODUODENOSCOPY (EGD) WITH PROPOFOL N/A 10/23/2019   normal esophagus s/p dilation, normal stomach, normal duodenum.   MALONEY DILATION N/A 10/23/2019   Procedure: Elease Hashimoto DILATION;  Surgeon: Corbin Ade,  MD;  Location: [facility];  Service: Endoscopy;  Laterality: N/A;   POLYPECTOMY  10/23/2019   Procedure: POLYPECTOMY;  Surgeon: Corbin Ade, MD;  Location: [facility];  Service: Endoscopy;;   TUBAL LIGATION      Current Outpatient Medications  Medication Sig Dispense Refill   albuterol (VENTOLIN HFA) 108 (90 Base) MCG/ACT inhaler Inhale 2 puffs into the lungs every 6 (six) hours as needed for wheezing or shortness of breath. 8 g 0   cholecalciferol (VITAMIN D3) 25 MCG (1000 UNIT) tablet Take 1,000 Units by mouth daily.     clonazePAM (KLONOPIN) 0.5 MG tablet Take 1/2 - 1 tab po BID for extreme anxiety 30 tablet 0   dexlansoprazole (DEXILANT) 60 MG capsule TAKE ONE CAPSULE BY MOUTH ONCE DAILY 30 capsule 0   magnesium oxide (MAG-OX) 400 MG tablet daily.     ondansetron (ZOFRAN) 8 MG tablet TAKE ONE TABLET BY MOUTH THREE TIMES DAILY AS NEEDED FOR NAUSEA 30 tablet 1   traZODone (DESYREL) 50 MG tablet Take 0.5-1 tablets (25-50 mg total) by mouth at bedtime as needed. (Patient not taking: Reported on 03/29/2023) 30 tablet 0   escitalopram (LEXAPRO) 10 MG tablet Take 1 tablet (10 mg total) by mouth daily. (Patient not taking: Reported on 03/29/2023) 90 tablet 0   ranolazine (RANEXA) 500 MG 12 hr tablet Take 1 tablet (500 mg total) by mouth 2 (two) times daily. (Patient not taking: Reported on 03/29/2023) 30 tablet 1   tirzepatide (ZEPBOUND) 12.5 MG/0.5ML Pen Inject 12.5 mg into the skin once a week. (Patient not taking: Reported on 03/29/2023) 2 mL 2   tiZANidine (ZANAFLEX) 4 MG tablet Take 1 tablet (4 mg total) by mouth every 8 (eight) hours as needed for muscle spasms. (Patient not taking: Reported on 03/29/2023) 20 tablet 0   No current facility-administered medications for this visit.    Allergies as of 03/29/2023 - Review Complete 03/29/2023  Allergen Reaction Noted   Fentanyl Other (See Comments) 09/29/2019   Prednisone  02/04/2020    Family History  Problem Relation Age of  Onset   Drug abuse Mother    Bipolar disorder Mother    Cancer Mother    Kidney disease Mother    Alcohol abuse Mother    Depression Mother    Anxiety disorder Mother    Paranoid behavior Mother    Hypertension Mother    Diabetes Mother    Breast cancer Mother    Obesity Mother    Obesity Father    Cancer Father    Stroke Father    Alcohol abuse Father    Hypertension Father    Diabetes Father    Physical abuse Brother    Healthy Daughter    GER disease Son    Healthy Son    Colon cancer Neg Hx    ADD / ADHD Neg Hx  Dementia Neg Hx    OCD Neg Hx    Schizophrenia Neg Hx    Seizures Neg Hx    Sexual abuse Neg Hx     Social History   Socioeconomic History   Marital status: Divorced    Spouse name: Not on file   Number of children: 2   Years of education: Not on file   Highest education level: 12th grade  Occupational History   Occupation: Licensed [employer]  Tobacco Use   Smoking status: Never   Smokeless tobacco: Never  Vaping Use   Vaping status: Never Used  Substance and Sexual Activity   Alcohol use: Not Currently    Comment: occas   Drug use: No   Sexual activity: Not Currently    Birth control/protection: Surgical    Comment: tubal, hyst  Other Topics Concern   Not on file  Social History Narrative   Not on file   Social Determinants of Health   Financial Resource Strain: High Risk (11/10/2022)   Overall Financial Resource Strain (CARDIA)    Difficulty of Paying Living Expenses: Very hard  Food Insecurity: Patient Declined (01/25/2023)   Hunger Vital Sign    Worried About Running Out of Food in the Last Year: Patient declined    Ran Out of Food in the Last Year: Patient declined  Transportation Needs: Patient Declined (01/25/2023)   PRAPARE - Administrator, Civil Service (Medical): Patient declined    Lack of Transportation (Non-Medical): Patient declined  Physical Activity: Inactive (11/10/2022)   Exercise Vital Sign    Days of  Exercise per Week: 0 days    Minutes of Exercise per Session: 20 min  Stress: Stress Concern Present (11/10/2022)   [employer] of Occupational Health - Occupational Stress Questionnaire    Feeling of Stress : Very much  Social Connections: Socially Isolated (11/10/2022)   Social Connection and Isolation Panel [NHANES]    Frequency of Communication with Friends and Family: More than three times a week    Frequency of Social Gatherings with Friends and Family: Twice a week    Attends Religious Services: Never    Database administrator or Organizations: No    Attends Banker Meetings: Never    Marital Status: Divorced  Catering manager Violence: Patient Declined (01/25/2023)   Humiliation, Afraid, Rape, and Kick questionnaire    Fear of Current or Ex-Partner: Patient declined    Emotionally Abused: Patient declined    Physically Abused: Patient declined    Sexually Abused: Patient declined     Review of Systems   Gen: Denies any fever, chills, fatigue, weight loss, lack of appetite.  CV: Denies chest pain, heart palpitations, peripheral edema, syncope.  Resp: Denies shortness of breath at rest or with exertion. Denies wheezing or cough.  GI: Denies dysphagia or odynophagia. Denies jaundice, hematemesis, fecal incontinence. GU : Denies urinary burning, urinary frequency, urinary hesitancy MS: Denies joint pain, muscle weakness, cramps, or limitation of movement.  Derm: Denies rash, itching, dry skin Psych: Denies depression, anxiety, memory loss, and confusion Heme: Denies bruising, bleeding, and enlarged lymph nodes.   Physical Exam   BP (!) 134/91 (BP Location: Right Arm, Patient Position: Sitting, Cuff Size: Large)   Pulse 81   Temp 97.7 F (36.5 C) (Oral)   Ht 5\' 5"  (1.651 m)   Wt 197 lb 14.4 oz (89.8 kg)   BMI 32.93 kg/m  General:   Alert and oriented. Pleasant and cooperative. Well-nourished  and well-developed.  Head:  Normocephalic and  atraumatic. Eyes:  Without icterus Abdomen:  +BS, soft, non-tender and non-distended. No HSM noted. No guarding or rebound. No masses appreciated.  Rectal:  Deferred  Msk:  Symmetrical without gross deformities. Normal posture. Extremities:  Without edema. Neurologic:  Alert and  oriented x4;  grossly normal neurologically. Skin:  Intact without significant lesions or rashes. Psych:  Alert and cooperative. Normal mood and affect.   Assessment/Plan   Theresa [name] is a 52 y.o. female presenting today with a history of  chronic GERD, constipation, intermittent bouts of LLQ abdominal pain and documented sigmoid diverticulitis on CT in April 2021, history of rectocele.   Constipation not ideally managed, failing LInzess and Amitiza. Will trial Motegrity. Sent this to pharmacy. I suspect significant pelvic floor dysfunction playing a role as well. Recommend anorectal manometry, but she would like to hold off on this due to other responsibilities currently. Colonoscopy on file 2021 with surveillance due in 2028.  GERD remains well-controlled on Dexilant.  Will trial Motegrity 2 mg daily with or without food. Continue Dexilant. Return in 3 months.     Gelene Mink, PhD, ANP-BC [facility]

## 2023-05-06 ENCOUNTER — Encounter: Payer: Self-pay | Admitting: Emergency Medicine

## 2023-05-06 ENCOUNTER — Ambulatory Visit
Admission: EM | Admit: 2023-05-06 | Discharge: 2023-05-06 | Disposition: A | Discharge status: Home / Self Care | Payer: Medicaid Other | Attending: Nurse Practitioner | Admitting: Nurse Practitioner

## 2023-05-06 ENCOUNTER — Other Ambulatory Visit: Payer: Self-pay

## 2023-05-06 DIAGNOSIS — J029 Acute pharyngitis, unspecified: Secondary | ICD-10-CM | POA: Diagnosis present

## 2023-05-06 DIAGNOSIS — J069 Acute upper respiratory infection, unspecified: Secondary | ICD-10-CM | POA: Diagnosis present

## 2023-05-06 DIAGNOSIS — B349 Viral infection, unspecified: Secondary | ICD-10-CM | POA: Diagnosis present

## 2023-05-06 DIAGNOSIS — Z1152 Encounter for screening for COVID-19: Secondary | ICD-10-CM | POA: Diagnosis present

## 2023-05-06 LAB — POCT RAPID STREP A (OFFICE): Rapid Strep A Screen: NEGATIVE

## 2023-05-06 LAB — POCT INFLUENZA A/B
Influenza A, POC: NEGATIVE
Influenza B, POC: NEGATIVE

## 2023-05-06 MED ORDER — PROMETHAZINE-DM 6.25-15 MG/5ML PO SYRP: 5.0000 mL | ORAL_SOLUTION | Freq: Four times a day (QID) | ORAL | 0 refills | Status: DC | PRN

## 2023-05-06 MED ORDER — LIDOCAINE VISCOUS HCL 2 % MT SOLN: OROMUCOSAL | 0 refills | Status: DC

## 2023-05-06 NOTE — ED Provider Notes (Signed)
 [facility]    CSN: [id number] Arrival date & time: 05/06/23  0846      History   Chief Complaint Chief Complaint  Patient presents with   Sore Throat    HPI Theresa [name] is a 53 y.o. female.   The history is provided by the patient.   Patient presents for complaints of chills, body aches, sore throat, and cough that been present for the past several days.  Patient also complains of feeling hot and cold.  She also complains of hoarseness.  Denies fever, headache, wheezing, difficulty breathing, chest pain, abdominal pain, nausea, vomiting, diarrhea, or rash.  Reports she has been taking garlic and honey and over-the-counter oscillococcinum.   Past Medical History:  Diagnosis Date   Anxiety    Back pain    Borderline diabetic    CFS (chronic fatigue syndrome)    Constipation    Depression    Diverticular disease    Elevated liver enzymes    Epilepsy (HCC)    Fatty liver    Fibromyalgia    Gallbladder problem    GERD (gastroesophageal reflux disease)    Headache(784.0)    Heart murmur    History of degenerative disc disease    Hyperplastic colon polyp 10/28/2019   Dr Debbie recommends repeat colonoscopy in 2028.   Hypertension    IBS (irritable bowel syndrome) w/ constipation    IFG (impaired fasting glucose)    Joint pain    Kidney problem    Lactose intolerance    Migraines    Obsessive-compulsive disorder    Osteoarthritis    Seizures (HCC)    last seizure at age 39-stressed induced seizure.   SOB (shortness of breath)    Swallowing difficulty    Torn meniscus    Vitamin D  deficiency     Patient Active Problem List   Diagnosis Date Noted   Elevated troponin 01/25/2023   Migraine headache 01/25/2023   Atypical chest pain 01/24/2023   Constipation 06/15/2022   Bruises easily 06/07/2022   Dizzy 06/07/2022   Vertigo 06/07/2022   Encounter for well woman exam with routine gynecological exam 04/12/2022   Encounter for screening fecal  occult blood testing 04/12/2022   Screening examination for STD (sexually transmitted disease) 04/12/2022   Screening for diabetes mellitus 04/12/2022   Screening cholesterol level 04/12/2022   Screening for thyroid  disorder 04/12/2022   Dyspareunia in female 04/12/2022   S/P hysterectomy 04/12/2022   Recurrent UTI 12/11/2021   SUI (stress urinary incontinence, female) 12/11/2021   Urinary urgency 12/11/2021   Overactive bladder 12/11/2021   Hypoestrogenism 12/11/2021   Nocturnal leg cramps 06/28/2021   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/02/2020   Acute respiratory failure with hypoxia (HCC) 12/22/2019   COVID-19 12/21/2019   Pneumonia due to COVID-19 virus 12/18/2019   Attention deficit disorder (ADD) without hyperactivity 11/15/2019   Hyperplastic colon polyp 10/28/2019   History of adenomatous polyp of colon 08/28/2019   Dysphagia 08/28/2019   Acute diverticulitis 08/13/2019   Hypokalemia 08/13/2019   Elevated liver transaminase level    Hair loss 07/11/2019   Brain fog 07/11/2019   Rectocele 07/11/2019   Sleep disturbance 07/11/2019   Gastroesophageal reflux disease without esophagitis 05/21/2019   Cervical radiculopathy 02/27/2019   Essential hypertension 02/27/2019   Adhesive capsulitis of right shoulder 11/06/2018   Prediabetes 11/26/2017   Migraine without aura and without status migrainosus, not intractable 10/04/2017   Class 2 severe obesity due to excess calories with serious comorbidity  and body mass index (BMI) of 35.0 to 35.9 in adult [facility]) 04/10/2016   Diverticulosis of large intestine 12/05/2015   BRCA negative 02/10/2015   IFG (impaired fasting glucose) 07/05/2014   Essential hypertension, benign 04/27/2014   Anxiety 04/27/2014   Abdominal pain, epigastric 02/07/2014   Nausea without vomiting 02/07/2014   Other fatigue 09/18/2012   Hyperglycemia 09/18/2012   Anxiety and depression 07/14/2012   Insomnia due to mental disorder 07/14/2012   Lower abdominal  pain 04/12/2012   Headache 04/12/2012   Irritable bowel syndrome with constipation 04/12/2012    Past Surgical History:  Procedure Laterality Date   ABDOMINAL HYSTERECTOMY     still has right ovary   BIOPSY N/A 03/05/2014   Procedure: DUODENAL AND GASTRIC BIOPSY;  Surgeon: Margo LITTIE Haddock, MD;  Location: [facility];  Service: Endoscopy;  Laterality: N/A;   bladder sling X 3     CHILECTOMY Left 03/12/2020   Procedure: CHILECTOMY HALLUX;  Surgeon: Tobie Buckles, DPM;  Location: [facility];  Service: Podiatry;  Laterality: Left;   CHOLECYSTECTOMY     COLONOSCOPY  04/24/2012   DOQ:Dzddpoz polyp measuring 5 mm in size was found in the proximal transverse colon; polypectomy was performed with cold forceps Pedunculated polyp measuring 1.1 cm in size was found in the sigmoid colon; polypectomy was performed using snare cautery Moderate diverticulosis was noted in the descending colon and sigmoid colon small internal hemorrhoids. next tcs 04/2017   COLONOSCOPY WITH PROPOFOL  N/A 10/23/2019   sigmoid and descending colon diverticulosis, one 3 mm polyp in cecum, one 4 mm polyp in descending colon. Hyperplastic   ESOPHAGOGASTRODUODENOSCOPY N/A 02/18/2014   Procedure: ESOPHAGOGASTRODUODENOSCOPY (EGD);  Surgeon: Margo LITTIE Haddock, MD;  Location: [facility];  Service: Endoscopy;  Laterality: N/A;  115   ESOPHAGOGASTRODUODENOSCOPY (EGD) WITH PROPOFOL  N/A 03/05/2014   SLF: 1. dyspepsia due to constipation, gastritis, and GERD 2. Multiple Gastric polyps 3. Moderate non-erosive gastritis 4. Diverticulum in the 2nd part of the duodenum   ESOPHAGOGASTRODUODENOSCOPY (EGD) WITH PROPOFOL  N/A 10/23/2019   normal esophagus s/p dilation, normal stomach, normal duodenum.   MALONEY DILATION N/A 10/23/2019   Procedure: AGAPITO DILATION;  Surgeon: Shaaron Lamar HERO, MD;  Location: [facility];  Service: Endoscopy;  Laterality: N/A;   POLYPECTOMY  10/23/2019   Procedure: POLYPECTOMY;  Surgeon: Shaaron Lamar HERO, MD;   Location: [facility];  Service: Endoscopy;;   TUBAL LIGATION      OB History     Gravida  2   Para  2   Term      Preterm      AB  0   Living  2      SAB      IAB      Ectopic  0   Multiple      Live Births  2            Home Medications    Prior to Admission medications   Medication Sig Start Date End Date Taking? Authorizing Provider  lidocaine  (XYLOCAINE ) 2 % solution Gargle and spit 5 mL every 6 hours as needed for throat pain or discomfort. 05/06/23  Yes Leath-Warren, Etta PARAS, NP  promethazine -dextromethorphan  (PROMETHAZINE -DM) 6.25-15 MG/5ML syrup Take 5 mLs by mouth 4 (four) times daily as needed. 05/06/23  Yes Leath-Warren, Etta PARAS, NP  traZODone  (DESYREL ) 50 MG tablet Take 0.5-1 tablets (25-50 mg total) by mouth at bedtime as needed. Patient not taking: Reported on 03/29/2023 03/07/23   Mauro Elveria BROCKS,  NP  albuterol  (VENTOLIN  HFA) 108 (90 Base) MCG/ACT inhaler Inhale 2 puffs into the lungs every 6 (six) hours as needed for wheezing or shortness of breath. 12/04/22   Hoskins, Carolyn C, NP  cholecalciferol (VITAMIN D3) 25 MCG (1000 UNIT) tablet Take 1,000 Units by mouth daily.    [provider]  clonazePAM  (KLONOPIN ) 0.5 MG tablet Take 1/2 - 1 tab po BID for extreme anxiety 01/28/23   Hoskins, Carolyn C, NP  dexlansoprazole  (DEXILANT ) 60 MG capsule TAKE ONE CAPSULE BY MOUTH ONCE DAILY 02/15/23   Shirlean Therisa ORN, NP  magnesium oxide (MAG-OX) 400 MG tablet daily.    [provider]  ondansetron  (ZOFRAN ) 8 MG tablet TAKE ONE TABLET BY MOUTH THREE TIMES DAILY AS NEEDED FOR NAUSEA 12/01/22   Shirlean Therisa ORN, NP  Prucalopride Succinate  (MOTEGRITY ) 2 MG TABS Take 1 tablet (2 mg total) by mouth daily. With or without food 03/29/23   Shirlean Therisa ORN, NP  ranolazine  (RANEXA ) 500 MG 12 hr tablet Take 1 tablet (500 mg total) by mouth 2 (two) times daily. Patient not taking: Reported on 03/29/2023 01/26/23   Maree Bracken D, DO  tirzepatide  (ZEPBOUND )  12.5 MG/0.5ML Pen Inject 12.5 mg into the skin once a week. Patient not taking: Reported on 03/29/2023 01/28/23   Mauro Elveria BROCKS, NP  tiZANidine  (ZANAFLEX ) 4 MG tablet Take 1 tablet (4 mg total) by mouth every 8 (eight) hours as needed for muscle spasms. Patient not taking: Reported on 03/29/2023 01/20/23   Leath-Warren, Etta PARAS, NP    Family History Family History  Problem Relation Age of Onset   Drug abuse Mother    Bipolar disorder Mother    Cancer Mother    Kidney disease Mother    Alcohol abuse Mother    Depression Mother    Anxiety disorder Mother    Paranoid behavior Mother    Hypertension Mother    Diabetes Mother    Breast cancer Mother    Obesity Mother    Obesity Father    Cancer Father    Stroke Father    Alcohol abuse Father    Hypertension Father    Diabetes Father    Physical abuse Brother    Healthy Daughter    GER disease Son    Healthy Son    Colon cancer Neg Hx    ADD / ADHD Neg Hx    Dementia Neg Hx    OCD Neg Hx    Schizophrenia Neg Hx    Seizures Neg Hx    Sexual abuse Neg Hx     Social History Social History   Tobacco Use   Smoking status: Never   Smokeless tobacco: Never  Vaping Use   Vaping status: Never Used  Substance Use Topics   Alcohol use: Not Currently    Comment: occas   Drug use: No     Allergies   Fentanyl  and Prednisone    Review of Systems Review of Systems Per HPI  Physical Exam Triage Vital Signs ED Triage Vitals  Encounter Vitals Group     BP 05/06/23 1007 (!) 148/82     Systolic BP Percentile --      Diastolic BP Percentile --      Pulse Rate 05/06/23 1007 98     Resp 05/06/23 1007 20     Temp 05/06/23 1007 99.3 F (37.4 C)     Temp Source 05/06/23 1007 Oral     SpO2 05/06/23 1007 97 %  Weight --      Height --      Head Circumference --      Peak Flow --      Pain Score 05/06/23 1006 6     Pain Loc --      Pain Education --      Exclude from Growth Chart --    No data  found.  Updated Vital Signs BP (!) 148/82 (BP Location: Right Arm)   Pulse 98   Temp 99.3 F (37.4 C) (Oral)   Resp 20   SpO2 97%   Visual Acuity Right Eye Distance:   Left Eye Distance:   Bilateral Distance:    Right Eye Near:   Left Eye Near:    Bilateral Near:     Physical Exam Vitals and nursing note reviewed.  Constitutional:      General: She is not in acute distress.    Appearance: She is well-developed.  HENT:     Head: Normocephalic.     Right Ear: Tympanic membrane, ear canal and external ear normal.     Left Ear: Tympanic membrane, ear canal and external ear normal.     Nose: Congestion present.     Mouth/Throat:     Mouth: Mucous membranes are moist.     Pharynx: Posterior oropharyngeal erythema present.     Tonsils: No tonsillar exudate.     Comments: Cobblestoning present to posterior oropharynx  Eyes:     Conjunctiva/sclera: Conjunctivae normal.     Pupils: Pupils are equal, round, and reactive to light.  Cardiovascular:     Rate and Rhythm: Normal rate and regular rhythm.     Pulses: Normal pulses.     Heart sounds: Normal heart sounds.  Pulmonary:     Effort: Pulmonary effort is normal. No respiratory distress.     Breath sounds: Normal breath sounds. No stridor. No wheezing, rhonchi or rales.  Abdominal:     General: Bowel sounds are normal. There is no distension.     Palpations: Abdomen is soft. There is no mass.     Tenderness: There is no abdominal tenderness. There is no guarding or rebound.  Musculoskeletal:     Cervical back: Normal range of motion.  Lymphadenopathy:     Cervical: No cervical adenopathy.  Skin:    General: Skin is warm and dry.  Neurological:     General: No focal deficit present.     Mental Status: She is alert and oriented to person, place, and time.  Psychiatric:        Mood and Affect: Mood normal.        Behavior: Behavior normal.      UC Treatments / Results  Labs (all labs ordered are listed, but only  abnormal results are displayed) Labs Reviewed  POCT RAPID STREP A (OFFICE) - Normal  POCT INFLUENZA A/B - Normal  SARS CORONAVIRUS 2 (TAT 6-24 HRS)  CULTURE, GROUP A STREP [facility])    EKG   Radiology No results found.  Procedures Procedures (including critical care time)  Medications Ordered in UC Medications - No data to display  Initial Impression / Assessment and Plan / UC Course  I have reviewed the triage vital signs and the nursing notes.  Pertinent labs & imaging results that were available during my care of the patient were reviewed by me and considered in my medical decision making (see chart for details).  The rapid strep test and influenza test were negative.  COVID test  and throat culture are pending.  Suspect patient may be experiencing a viral illness or viral URI with cough.  Symptomatic treatment provided with viscous lidocaine  2% for patient to gargle and spit, and Promethazine  DM for cough.  Supportive care recommendations were provided and discussed with the patient to include fluids, rest, over-the-counter analgesics, warm salt water  gargles, and use of a humidifier for cough.  Discussed indications with the patient regarding when follow-up be indicated.  Patient was in agreement with this plan of care and verbalizes understanding.  All questions were answered.  Patient stable for discharge. Final Clinical Impressions(s) / UC Diagnoses   Final diagnoses:  Viral illness  Viral upper respiratory tract infection with cough  Sore throat  Encounter for screening for COVID-19     Discharge Instructions      The rapid strep test and influenza test were negative.  A throat culture and COVID test are pending.  You will be contacted if the pending test result is abnormal. Take medication as prescribed. Increase fluids and allow for plenty of rest. May take over-the-counter Tylenol  or ibuprofen as needed for pain, fever, or general discomfort. Warm salt water  gargles  3-4 times daily as needed for throat pain or discomfort. Recommend a soft diet while throat pain persist to include soup, broth, yogurt, pudding, Jell-O, or popsicles. Chloraseptic throat spray as needed for throat pain. For your cough, recommend using a humidifier in your bedroom at nighttime during sleep and sleeping slightly elevated on pillows while cough persist. If symptoms fail to improve over the next several days, or begin to worsen, you may follow-up in this clinic or with your PCP for further evaluation. Follow-up as needed.     ED Prescriptions     Medication Sig Dispense Auth. Provider   promethazine -dextromethorphan  (PROMETHAZINE -DM) 6.25-15 MG/5ML syrup Take 5 mLs by mouth 4 (four) times daily as needed. 118 mL Leath-Warren, Etta PARAS, NP   lidocaine  (XYLOCAINE ) 2 % solution Gargle and spit 5 mL every 6 hours as needed for throat pain or discomfort. 5 mL Leath-Warren, Etta PARAS, NP      PDMP not reviewed this encounter.   Gilmer Etta PARAS, NP 05/06/23 1056

## 2023-05-06 NOTE — ED Triage Notes (Signed)
 Pt reports sore throat, chills, body aches, hot spells, hoarseness since Monday. Denies any known fevers.

## 2023-05-06 NOTE — Discharge Instructions (Addendum)
 The rapid strep test and influenza test were negative.  A throat culture and COVID test are pending.  You will be contacted if the pending test result is abnormal. Take medication as prescribed. Increase fluids and allow for plenty of rest. May take over-the-counter Tylenol  or ibuprofen as needed for pain, fever, or general discomfort. Warm salt water  gargles 3-4 times daily as needed for throat pain or discomfort. Recommend a soft diet while throat pain persist to include soup, broth, yogurt, pudding, Jell-O, or popsicles. Chloraseptic throat spray as needed for throat pain. For your cough, recommend using a humidifier in your bedroom at nighttime during sleep and sleeping slightly elevated on pillows while cough persist. If symptoms fail to improve over the next several days, or begin to worsen, you may follow-up in this clinic or with your PCP for further evaluation. Follow-up as needed.

## 2023-05-07 LAB — SARS CORONAVIRUS 2 (TAT 6-24 HRS): SARS Coronavirus 2: NEGATIVE

## 2023-05-09 ENCOUNTER — Ambulatory Visit: Payer: Self-pay | Admitting: Family Medicine

## 2023-05-09 LAB — CULTURE, GROUP A STREP (THRC)

## 2023-05-09 NOTE — Telephone Encounter (Signed)
 Copied from CRM [id number]. Topic: Clinical - Pink Word Triage >> May 09, 2023  4:08 PM Fredrica W wrote: Reason for Triage: Patient called states she was recently seen at urgent care and given cough medicine but it is not helping. Her cough is getting worse and now she has to vomit a lot when she coughs and has been taking a lot of peptobismol. She also has a bad headache. Appointment  for tomorrow. Thank You   1st attempt to reach patient, no answer, LVM

## 2023-05-09 NOTE — Telephone Encounter (Signed)
 Copied from CRM [id number]. Topic: Clinical - Pink Word Triage >> May 09, 2023  4:08 PM Fredrica W wrote: Reason for Triage: Patient called states she was recently seen at urgent care and given cough medicine but it is not helping. Her cough is getting worse and now she has to vomit a lot when she coughs and has been taking a lot of peptobismol. She also has a bad headache. Appointment  for tomorrow. Thank You   Chief Complaint: Cough Symptoms: Cough, headache, post cough vomiting  Frequency: Frequent  Pertinent Negatives: Patient denies sputum production, fever, difficulty breathing  Disposition: [] ED /[] Urgent Care (no appt availability in office) / [x] Appointment(In office/virtual)/ []  [facility]/ [] Home Care/ [] Refused Recommended Disposition /[] [place] [facility]/ []  Follow-up with PCP Additional Notes: Patient reports she has been experiencing a cough for the last 4 days. She states she was seen and treated at urgent care, but that her cough has been worsening. She states with her cough she has been experiencing a headache and post cough vomiting. She denies any fever, sputum production, or shortness of breath. She states she was given a cough syrup by urgent care that has not helped her symptoms. Home care discussed with the patient. The patient already has an appointment for tomorrow.      Reason for Disposition  SEVERE coughing spells (e.g., whooping sound after coughing, vomiting after coughing)  Answer Assessment - Initial Assessment Questions 1. ONSET: When did the cough begin?      4 days ago 2. SEVERITY: How bad is the cough today?      Severe 3. SPUTUM: Describe the color of your sputum (none, dry cough; clear, white, yellow, green)     Not productive  4. HEMOPTYSIS: Are you coughing up any blood? If so ask: How much? (flecks, streaks, tablespoons, etc.)     No 5. DIFFICULTY BREATHING: Are you having difficulty breathing? If Yes, ask: How bad is  it? (e.g., mild, moderate, severe)    - MILD: No SOB at rest, mild SOB with walking, speaks normally in sentences, can lie down, no retractions, pulse < 100.    - MODERATE: SOB at rest, SOB with minimal exertion and prefers to sit, cannot lie down flat, speaks in phrases, mild retractions, audible wheezing, pulse 100-120.    - SEVERE: Very SOB at rest, speaks in single words, struggling to breathe, sitting hunched forward, retractions, pulse > 120      Only when coughing  6. FEVER: Do you have a fever? If Yes, ask: What is your temperature, how was it measured, and when did it start?     No 7. CARDIAC HISTORY: Do you have any history of heart disease? (e.g., heart attack, congestive heart failure)      No 8. LUNG HISTORY: Do you have any history of lung disease?  (e.g., pulmonary embolus, asthma, emphysema)     No 9. PE RISK FACTORS: Do you have a history of blood clots? (or: recent major surgery, recent prolonged travel, bedridden)     No 10. OTHER SYMPTOMS: Do you have any other symptoms? (e.g., runny nose, wheezing, chest pain)       Headache, post cough vomiting  11. PREGNANCY: Is there any chance you are pregnant? When was your last menstrual period?       No 12. TRAVEL: Have you traveled out of the country in the last month? (e.g., travel history, exposures)       No  Protocols used:  Cough - Acute Non-Productive-A-AH

## 2023-05-10 ENCOUNTER — Encounter: Payer: Self-pay | Admitting: Nurse Practitioner

## 2023-05-10 ENCOUNTER — Telehealth: Payer: Medicaid Other | Admitting: Nurse Practitioner

## 2023-05-10 DIAGNOSIS — R062 Wheezing: Secondary | ICD-10-CM

## 2023-05-10 DIAGNOSIS — B9689 Other specified bacterial agents as the cause of diseases classified elsewhere: Secondary | ICD-10-CM | POA: Diagnosis not present

## 2023-05-10 DIAGNOSIS — R053 Chronic cough: Secondary | ICD-10-CM

## 2023-05-10 DIAGNOSIS — J069 Acute upper respiratory infection, unspecified: Secondary | ICD-10-CM | POA: Diagnosis not present

## 2023-05-10 MED ORDER — ALBUTEROL SULFATE HFA 108 (90 BASE) MCG/ACT IN AERS: 2.0000 | INHALATION_SPRAY | RESPIRATORY_TRACT | 0 refills | Status: AC | PRN

## 2023-05-10 MED ORDER — AZITHROMYCIN 250 MG PO TABS: ORAL_TABLET | ORAL | 0 refills | Status: DC

## 2023-05-10 MED ORDER — BUDESONIDE-FORMOTEROL FUMARATE 80-4.5 MCG/ACT IN AERO: 2.0000 | INHALATION_SPRAY | Freq: Two times a day (BID) | RESPIRATORY_TRACT | 2 refills | Status: DC

## 2023-05-10 NOTE — Progress Notes (Signed)
 Virtual Visit via Video Note  I connected with LARRAINE ARGO on 05/10/23 at  3:50 PM EST by a video enabled telemedicine application and verified that I am speaking with the correct person using two identifiers.  Location: Patient: home Provider: office   I discussed the limitations of evaluation and management by telemedicine and the availability of in person appointments. The patient expressed understanding and agreed to proceed.  History of Present Illness: Presents for recheck after being seen in local ED on 05/06/2023.  Has had cough and congestion for 2 weeks.  No fever at this point.  Very frequent nonproductive cough, spells of coughing to the point hard to get her breath and causes gagging.  Hoarseness.  No sore throat.  No ear pain.  Generalized chest pain with coughing.  Also headache due to uncontrolled cough.  Wheezing mainly at nighttime.  Oxygen  level at home running 93-98.  Denies acid reflux or heartburn symptoms.  Has tried multiple products including ibuprofen Mucinex  honey and her prescription Promethazine  DM with minimal relief.  Has used an albuterol  inhaler that she has at home with slight temporary relief.   Observations/Objective: NAD.  Alert, oriented.  Distinct hoarseness noted.  No tachypnea.  Occasional cough noted.  Can talk without pauses in her breathing. 05/06/2023: Throat culture negative, COVID test negative, influenza test negative.  Assessment and Plan: Bacterial URI  Cough, persistent  Wheezing Meds ordered this encounter  Medications   azithromycin  (ZITHROMAX  Z-PAK) 250 MG tablet    Sig: Take 2 tablets (500 mg) on  Day 1,  followed by 1 tablet (250 mg) once daily on Days 2 through 5.    Dispense:  6 each    Refill:  0    Supervising Provider:   ALPHONSA HAMILTON A [[id number]]   albuterol  (VENTOLIN  HFA) 108 (90 Base) MCG/ACT inhaler    Sig: Inhale 2 puffs into the lungs every 4 (four) hours as needed for wheezing or shortness of breath.    Dispense:  8 g     Refill:  0    Supervising Provider:   ALPHONSA HAMILTON A [[id number]]   budesonide -formoterol  (SYMBICORT ) 80-4.5 MCG/ACT inhaler    Sig: Inhale 2 puffs into the lungs 2 (two) times daily. For cough and wheezing    Dispense:  1 each    Refill:  2    Please dispense name brand Symbicort  per Medicaid formulary. Thanks.    Supervising Provider:   ALPHONSA HAMILTON A [[id number]]     Follow Up Instructions: Start Z-Pak as directed. Start Symbicort  as directed to help prevent cough and wheezing.  Rinse out mouth or brush teeth after use. Refill albuterol  inhaler to have on hand as a rescue inhaler.  Patient verbalizes understanding of the differences between the inhalers. Continue OTC meds as directed for congestion and cough. Warning signs reviewed.  Call back by the end of the week if no improvement, sooner if worse.   I discussed the assessment and treatment plan with the patient. The patient was provided an opportunity to ask questions and all were answered. The patient agreed with the plan and demonstrated an understanding of the instructions.   The patient was advised to call back or seek an in-person evaluation if the symptoms worsen or if the condition fails to improve as anticipated.  I provided 15 minutes of non-face-to-face time during this encounter.   Elveria JAYSON Quarry, NP

## 2023-05-13 ENCOUNTER — Telehealth: Payer: Self-pay

## 2023-05-13 ENCOUNTER — Ambulatory Visit: Payer: Self-pay | Admitting: Family Medicine

## 2023-05-13 NOTE — Telephone Encounter (Signed)
 Plan & she needs an office visit I could work her in relatively quickly if she can come up in the next 30 minutes or she can go to urgent care or ER

## 2023-05-13 NOTE — Telephone Encounter (Signed)
 Patient states she is caring for her grand baby and her numbers have been better since early this morning. Patient advised to go to urgent care or ER if continued difficulties

## 2023-05-13 NOTE — Telephone Encounter (Signed)
 E2C2  Nurse line called said Theresa [name] refused ER

## 2023-05-13 NOTE — Telephone Encounter (Signed)
 Chief Complaint: symptoms not improved on medication/antibiotic Symptoms: voice hoarseness, chest tightness, SOB, congestion, headache Frequency: x 3 weeks, day 4 on antibiotics and nebulizer; symptoms worse when coughing  Pertinent Negatives: Patient denies fever, chills, body aches Disposition: [x] ED /[] Urgent Care (no appt availability in office) / [] Appointment(In office/virtual)/ []  [facility]/ [] Home Care/ [x] Refused Recommended Disposition /[] [facility]/ []  Follow-up with PCP Additional Notes: Patient seen on 05/10/23 for virtual visit. Patient states she started her medications (Zpack, albuterol  and symbicort ) on the 7th. Patient states her moderate SOB has not improved. She feels she is only getting slightly better day by day. Patient asking if she can take her friends inhaler, instructed patient to take her prescribed inhalers per instructions. Patient stats she was only taking the albuterol  bid and did not know it was q4h PRN. Patient states her SpO2 was 91% and improved to 97% after deep breathing. Patient refused ED disposition and also states she does not want to come in the office in person either don't wanna catch any other illness.  Copied from CRM [id number]. Topic: Clinical - Red Word Triage >> May 13, 2023 11:32 AM Graeme ORN wrote: Red Word that prompted transfer to Nurse Triage: Tightness in chest. Concerned about oxygen  levels dropped 2 91 has 2 inhalers and nebulizer - not sure about what medicine to use for nebuizer Reason for Disposition  [1] MODERATE difficulty breathing (e.g., speaks in phrases, SOB even at rest, pulse 100-120) AND [2] NEW-onset or WORSE than normal  Answer Assessment - Initial Assessment Questions 1. RESPIRATORY STATUS: Describe your breathing? (e.g., wheezing, shortness of breath, unable to speak, severe coughing)      Coughing, crackling when breathing, SOB with exertion.  2. ONSET: When did this breathing problem begin?       Roughly 3 weeks ago.  3. PATTERN Does the difficult breathing come and go, or has it been constant since it started?      Constant; patient states she feels it is improving on the Z pack.  4. SEVERITY: How bad is your breathing? (e.g., mild, moderate, severe)    - MILD: No SOB at rest, mild SOB with walking, speaks normally in sentences, can lie down, no retractions, pulse < 100.    - MODERATE: SOB at rest, SOB with minimal exertion and prefers to sit, cannot lie down flat, speaks in phrases, mild retractions, audible wheezing, pulse 100-120.    - SEVERE: Very SOB at rest, speaks in single words, struggling to breathe, sitting hunched forward, retractions, pulse > 120      Moderate.  5. RECURRENT SYMPTOM: Have you had difficulty breathing before? If Yes, ask: When was the last time? and What happened that time?      Denies.  6. CARDIAC HISTORY: Do you have any history of heart disease? (e.g., heart attack, angina, bypass surgery, angioplasty)      Denies.  7. LUNG HISTORY: Do you have any history of lung disease?  (e.g., pulmonary embolus, asthma, emphysema)     COVID in 2021 which she states hospitalized due to hypoxia and lungs were compromised  8. CAUSE: What do you think is causing the breathing problem?      Diagnosed on 1/7 with bacterial URI.  9. OTHER SYMPTOMS: Do you have any other symptoms? (e.g., dizziness, runny nose, cough, chest pain, fever)     Moderate chest tightness/pressure in center of chest when coughing, ribs and abdomen sore from coughing, headache when coughing,   10.  O2 SATURATION MONITOR:  Do you use an oxygen  saturation monitor (pulse oximeter) at home? If Yes, ask: What is your reading (oxygen  level) today? What is your usual oxygen  saturation reading? (e.g., 95%)       91% reading today, patient states she did deep breathing exercise and it came back up to   11. TRAVEL: Have you traveled out of the country in the last month?  (e.g., travel history, exposures)       Denies.  Protocols used: Breathing Difficulty-A-AH

## 2023-05-13 NOTE — Telephone Encounter (Signed)
 See triage message.

## 2023-06-08 ENCOUNTER — Ambulatory Visit: Payer: Medicaid Other | Admitting: Cardiology

## 2023-06-30 ENCOUNTER — Ambulatory Visit: Payer: Medicaid Other | Admitting: Gastroenterology

## 2023-06-30 ENCOUNTER — Encounter: Payer: Self-pay | Admitting: Nurse Practitioner

## 2023-07-20 ENCOUNTER — Other Ambulatory Visit: Payer: Self-pay | Admitting: Nurse Practitioner

## 2023-07-20 ENCOUNTER — Ambulatory Visit: Admitting: Physician Assistant

## 2023-07-20 ENCOUNTER — Encounter: Payer: Self-pay | Admitting: Physician Assistant

## 2023-07-20 VITALS — BP 110/78 | HR 89 | Temp 97.0°F | Ht 65.0 in | Wt 219.0 lb

## 2023-07-20 DIAGNOSIS — K5792 Diverticulitis of intestine, part unspecified, without perforation or abscess without bleeding: Secondary | ICD-10-CM | POA: Diagnosis not present

## 2023-07-20 MED ORDER — AMOXICILLIN-POT CLAVULANATE 875-125 MG PO TABS: 1.0000 | ORAL_TABLET | Freq: Two times a day (BID) | ORAL | 0 refills | Status: AC

## 2023-07-20 MED ORDER — CLONAZEPAM 0.5 MG PO TABS: ORAL_TABLET | ORAL | 0 refills | Status: DC

## 2023-07-20 NOTE — Progress Notes (Signed)
 Acute Office Visit  Subjective:     Patient ID: Theresa [name], female    DOB: Jun 08, 1970, 53 y.o.   MRN: [id number]   Patient presents today with concerns of diverticulitis.  She states she has had diverticulitis flares in the past and symptoms feel similar today.  She reports constipation with lower left quadrant pain and bloating.  Patient states typical bowel movements every 2 to 3 days however recently due to constipation has been having small bowel movements daily.  She reports symptoms onset about 2 weeks ago and has attempted MiraLAX and liquid diet as well as decreasing red meat and processed foods while increasing vegetable and leafy green intake.  She denies fevers or blood in her stool.  She has previously seen GI however is looking to switch providers.  Patient also requesting refill on clonazepam today.  She is also interested in restarting phentermine for weight loss.    Review of Systems  Constitutional:  Negative for fever and malaise/fatigue.  Gastrointestinal:  Positive for abdominal pain, constipation and heartburn. Negative for blood in stool, diarrhea, nausea and vomiting.  Genitourinary:  Negative for dysuria.  Neurological:  Negative for headaches.        Objective:     BP 110/78   Pulse 89   Temp (!) 97 F (36.1 C)   Ht 5\' 5"  (1.651 m)   Wt 219 lb (99.3 kg)   SpO2 97%   BMI 36.44 kg/m   Physical Exam Constitutional:      Appearance: Normal appearance. She is obese.  HENT:     Head: Normocephalic.     Mouth/Throat:     Mouth: Mucous membranes are moist.     Pharynx: Oropharynx is clear.  Eyes:     Extraocular Movements: Extraocular movements intact.     Conjunctiva/sclera: Conjunctivae normal.  Cardiovascular:     Rate and Rhythm: Normal rate and regular rhythm.     Heart sounds: Normal heart sounds. No murmur heard. Pulmonary:     Effort: Pulmonary effort is normal.     Breath sounds: Normal breath sounds.  Abdominal:     General:  Abdomen is flat. Bowel sounds are normal.     Palpations: Abdomen is soft.     Tenderness: There is abdominal tenderness in the epigastric area and left lower quadrant. There is no guarding or rebound.  Skin:    General: Skin is warm and dry.  Neurological:     General: No focal deficit present.     Mental Status: She is alert and oriented to person, place, and time.  Psychiatric:        Mood and Affect: Mood normal.        Behavior: Behavior normal.     No results found for any visits on 07/20/23.      Assessment & Plan:  Acute diverticulitis Assessment & Plan: History consistent with constipation, exam significant for LLQ tenderness, normal bowel sounds.  Will cover diverticulitis with Augmentin x7 days.  Miralax hourly for 6 hours until BM. May repeat tomorrow if no BM. Advised to increase fiber, fluids, fruits, and vegetables in diet. Encouraged to follow up with GI.    Orders: -     Amoxicillin-Pot Clavulanate; Take 1 tablet by mouth 2 (two) times daily for 7 days.  Dispense: 14 tablet; Refill: 0   Will contact with Eber Jones, NP, patient's preferred provider about klonopin refill and interest in phentermine for weight loss.   Return if symptoms worsen  or fail to improve.  Toni Amend Marcos Peloso, PA-C

## 2023-07-20 NOTE — Assessment & Plan Note (Signed)
 History consistent with constipation, exam significant for LLQ tenderness, normal bowel sounds.  Will cover diverticulitis with Augmentin x7 days.  Miralax hourly for 6 hours until BM. May repeat tomorrow if no BM. Advised to increase fiber, fluids, fruits, and vegetables in diet. Encouraged to follow up with GI.

## 2023-07-29 ENCOUNTER — Encounter: Payer: Self-pay | Admitting: Nurse Practitioner

## 2023-07-29 ENCOUNTER — Ambulatory Visit: Admitting: Nurse Practitioner

## 2023-07-29 VITALS — BP 129/84 | HR 80 | Temp 98.1°F | Ht 65.0 in | Wt 218.0 lb

## 2023-07-29 DIAGNOSIS — K219 Gastro-esophageal reflux disease without esophagitis: Secondary | ICD-10-CM

## 2023-07-29 DIAGNOSIS — K581 Irritable bowel syndrome with constipation: Secondary | ICD-10-CM | POA: Diagnosis not present

## 2023-07-29 DIAGNOSIS — K5909 Other constipation: Secondary | ICD-10-CM

## 2023-07-29 DIAGNOSIS — R1030 Lower abdominal pain, unspecified: Secondary | ICD-10-CM

## 2023-07-29 DIAGNOSIS — R8290 Unspecified abnormal findings in urine: Secondary | ICD-10-CM | POA: Diagnosis not present

## 2023-07-29 DIAGNOSIS — N816 Rectocele: Secondary | ICD-10-CM

## 2023-07-29 DIAGNOSIS — I10 Essential (primary) hypertension: Secondary | ICD-10-CM

## 2023-07-29 DIAGNOSIS — R350 Frequency of micturition: Secondary | ICD-10-CM | POA: Diagnosis not present

## 2023-07-29 DIAGNOSIS — R3915 Urgency of urination: Secondary | ICD-10-CM | POA: Diagnosis not present

## 2023-07-29 LAB — POCT URINALYSIS DIP (CLINITEK)
Bilirubin, UA: NEGATIVE
Blood, UA: NEGATIVE
Glucose, UA: NEGATIVE mg/dL
Ketones, POC UA: NEGATIVE mg/dL
Leukocytes, UA: NEGATIVE
Nitrite, UA: NEGATIVE
POC PROTEIN,UA: NEGATIVE
Spec Grav, UA: 1.01 (ref 1.010–1.025)
Urobilinogen, UA: 0.2 U/dL
pH, UA: 6 (ref 5.0–8.0)

## 2023-07-30 ENCOUNTER — Encounter: Payer: Self-pay | Admitting: Nurse Practitioner

## 2023-07-30 NOTE — Progress Notes (Signed)
 Subjective:    Patient ID: Theresa [name], female    DOB: 25-Jan-1971, 53 y.o.   MRN: [id number]  HPI Presents for complaints of urinary symptoms for the past 2 to 3 weeks.  States her urine "smells very bad".  No dysuria.  Long-term history of urgency and frequency slightly worse than usual.  No fever.  Also having issues with chronic constipation.  Rotates multiple different regimens to keep her bowels moving, usually about 3 times per week.  Had a BM this morning.  Has been told she has a rectocele.  This morning had to put her fingers in her vagina and push against the rectal wall to help her pass her bowel movement. BP at home usually runs close to normal.  On 3/22 patient was very upset about a situation and her BP went up to 171/111 and slowly came down over time back to normal over a few hours.  Took Klonopin at that time.  Has been working on starting a new career and job.  Continues to have some depression and anxiety symptoms but defers intervention or medication at this time.  Takes rare clonazepam only for extreme anxiety.  Taking Dexilant as directed for GERD which has been stable.  Review of Systems  Constitutional:  Positive for fatigue. Negative for fever.  Respiratory:  Positive for chest tightness. Negative for cough, shortness of breath and wheezing.        Chest tightness only with extreme stress.  Unassociated with activity.  Patient goes walking each morning without difficulty.  Cardiovascular:  Negative for chest pain.  Genitourinary:  Positive for frequency and pelvic pain. Negative for dysuria, flank pain, genital sores, hematuria, urgency and vaginal discharge.       No new sexual partners over the past 2 years.  Neurological:  Negative for facial asymmetry, speech difficulty, weakness and numbness.   Social History   Tobacco Use   Smoking status: Never   Smokeless tobacco: Never  Vaping Use   Vaping status: Never Used  Substance Use Topics   Alcohol use: Not  Currently    Comment: occas   Drug use: No        Objective:   Physical Exam NAD.  Alert, oriented.  Calm cheerful affect.  Speech clear.  Making good eye contact.  Lungs clear.  Heart regular rate rhythm.  No murmur or gallop noted.  No CVA tenderness.  Abdomen soft nondistended with active bowel sounds x 4.  Mild tenderness with deep palpation left lower quadrant, no rebound or guarding no obvious masses.  Also moderate tenderness with palpation of the epigastric area. Results for orders placed or performed in visit on 07/29/23  POCT URINALYSIS DIP (CLINITEK)   Collection Time: 07/29/23 11:49 AM  Result Value Ref Range   Color, UA yellow yellow   Clarity, UA clear clear   Glucose, UA negative negative mg/dL   Bilirubin, UA negative negative   Ketones, POC UA negative negative mg/dL   Spec Grav, UA 4.098 1.191 - 1.025   Blood, UA negative negative   pH, UA 6.0 5.0 - 8.0   POC PROTEIN,UA negative negative, trace   Urobilinogen, UA 0.2 0.2 or 1.0 E.U./dL   Nitrite, UA Negative Negative   Leukocytes, UA Negative Negative   Today's Vitals   07/29/23 1054  BP: 129/84  Pulse: 80  Temp: 98.1 F (36.7 C)  SpO2: 95%  Weight: 218 lb (98.9 kg)  Height: 5\' 5"  (1.651 m)   Body  mass index is 36.28 kg/m.        Assessment & Plan:   Problem List Items Addressed This Visit       Cardiovascular and Mediastinum   Essential hypertension     Digestive   Chronic constipation   Relevant Orders   Ambulatory referral to Gastroenterology   Gastroesophageal reflux disease without esophagitis   Relevant Orders   Ambulatory referral to Gastroenterology   Irritable bowel syndrome with constipation   Relevant Orders   Ambulatory referral to Gastroenterology   Rectocele   Relevant Orders   Ambulatory referral to Gastroenterology     Other   RESOLVED: Lower abdominal pain - Primary   Urinary urgency   Relevant Orders   POCT URINALYSIS DIP (CLINITEK) (Completed)   Urine Culture    Other Visit Diagnoses       Malodorous urine       Relevant Orders   POCT URINALYSIS DIP (CLINITEK) (Completed)   Urine Culture        Urine culture pending. From history and physical exam, feel patient needs GI consult for evaluation of rectocele, constipation and persistent epigastric tenderness on exam despite Dexilant use.  Referral placed. Discussed the importance of adequate rest, stress reduction, healthy diet and regular activity.  Patient decided not to start trazodone written at a previous visit. Return in about 3 months (around 10/29/2023).

## 2023-07-31 ENCOUNTER — Encounter: Payer: Self-pay | Admitting: Nurse Practitioner

## 2023-07-31 LAB — SPECIMEN STATUS REPORT

## 2023-07-31 LAB — URINE CULTURE: Organism ID, Bacteria: NO GROWTH

## 2023-08-02 ENCOUNTER — Other Ambulatory Visit: Payer: Self-pay | Admitting: Gastroenterology

## 2023-08-04 ENCOUNTER — Ambulatory Visit: Attending: Cardiology | Admitting: Cardiology

## 2023-08-04 ENCOUNTER — Encounter: Payer: Self-pay | Admitting: Cardiology

## 2023-08-04 VITALS — BP 120/78 | HR 89 | Ht 65.0 in | Wt 222.0 lb

## 2023-08-04 DIAGNOSIS — Z6835 Body mass index (BMI) 35.0-35.9, adult: Secondary | ICD-10-CM

## 2023-08-04 DIAGNOSIS — R0789 Other chest pain: Secondary | ICD-10-CM | POA: Diagnosis not present

## 2023-08-04 DIAGNOSIS — I10 Essential (primary) hypertension: Secondary | ICD-10-CM

## 2023-08-04 DIAGNOSIS — R079 Chest pain, unspecified: Secondary | ICD-10-CM

## 2023-08-04 DIAGNOSIS — G44219 Episodic tension-type headache, not intractable: Secondary | ICD-10-CM

## 2023-08-04 DIAGNOSIS — E66812 Obesity, class 2: Secondary | ICD-10-CM

## 2023-08-04 MED ORDER — ASPIRIN 81 MG PO TBEC: 81.0000 mg | DELAYED_RELEASE_TABLET | Freq: Every day | ORAL | 3 refills | Status: DC

## 2023-08-04 MED ORDER — METOPROLOL TARTRATE 50 MG PO TABS: ORAL_TABLET | ORAL | 0 refills | Status: DC

## 2023-08-04 NOTE — Patient Instructions (Addendum)
 Medication Instructions:  Your physician recommends the following medication changes.  START TAKING: Aspirin 81 MG   *If you need a refill on your cardiac medications before your next appointment, please call your pharmacy*  Lab Work: No labs ordered today  If you have labs (blood work) drawn today and your tests are completely normal, you will receive your results only by: MyChart Message (if you have MyChart) OR A paper copy in the mail If you have any lab test that is abnormal or we need to change your treatment, we will call you to review the results.  Testing/Procedures:   Your cardiac CT will be scheduled at one of the below locations:   [facility] [address] [place], [place] [address] [phone]  OR  [facility] [address] [place], [place] [address] [phone]  OR   [facility] [address] [place], [place] [address] [phone]  OR   [facility] [address] [place], [place] [address] [phone]  OR   [facility] and V[facility] Tower [address]  [place], [place] [address] Opening August 29, 2023  If scheduled at [facility], please arrive at the [facility] and Children's Entrance (Entrance C2) of [facility] 30 minutes prior to test start time. You can use the FREE valet parking offered at entrance C (encouraged to control the heart rate for the test)  Proceed to the [facility] Radiology Department (first floor) to check-in and test prep.  All radiology patients and guests should use entrance C2 at [facility], accessed from [facility], even though the hospital's physical address listed is [address].    If scheduled at [facility] or [facility], please arrive 15 mins early for check-in and test prep.  There is spacious parking  and easy access to the radiology department from the [facility] Heart and Vascular entrance. Please enter here and check-in with the desk attendant.   If scheduled at [facility], please arrive 30 minutes early for check-in and test prep.  Please follow these instructions carefully (unless otherwise directed):  An IV will be required for this test and Nitroglycerin will be given.  Hold all erectile dysfunction medications at least 3 days (72 hrs) prior to test. (Ie viagra, cialis, sildenafil, tadalafil, etc)   On the Night Before the Test: Be sure to Drink plenty of water. Do not consume any caffeinated/decaffeinated beverages or chocolate 12 hours prior to your test. Do not take any antihistamines 12 hours prior to your test.  On the Day of the Test: Drink plenty of water until 1 hour prior to the test. Do not eat any food 1 hour prior to test. You may take your regular medications prior to the test.  Take metoprolol (Lopressor) two hours prior to test. Patients who wear a continuous glucose monitor MUST remove the device prior to scanning. FEMALES- please wear underwire-free bra if available, avoid dresses & tight clothing  After the Test: Drink plenty of water. After receiving IV contrast, you may experience a mild flushed feeling. This is normal. On occasion, you may experience a mild rash up to 24 hours after the test. This is not dangerous. If this occurs, you can take Benadryl 25 mg, Zyrtec, Claritin, or Allegra and increase your fluid intake. (Patients taking Tikosyn should avoid Benadryl, and may take Zyrtec, Claritin, or Allegra)  If you experience trouble breathing, this can be serious. If it is severe call 911 IMMEDIATELY. If it is mild, please call our office.  We will call to schedule your test 2-4 weeks out understanding that some insurance companies will need an authorization prior to the service being performed.   For more information and frequently asked questions,  please visit our website : [email or web address]  For non-scheduling related questions, please contact the cardiac imaging nurse navigator should you have any questions/concerns: Cardiac Imaging Nurse Navigators Direct Office Dial: [phone]   For scheduling needs, including cancellations and rescheduling, please call [place], [phone].   Follow-Up: At [facility], you and your health needs are our priority.  As part of our continuing mission to provide you with exceptional heart care, our providers are all part of one team.  This team includes your primary Cardiologist (physician) and Advanced Practice Providers or APPs (Physician Assistants and Nurse Practitioners) who all work together to provide you with the care you need, when you need it.  Your next appointment:   6 week(s)  Provider:   You will see one of the following Advanced Practice Providers on your designated Care Team:   Nicolasa Ducking, NP Ames Dura, PA-C Eula Listen, PA-C Cadence Delmar, PA-C Charlsie Quest, NP Carlos Levering, NP

## 2023-08-04 NOTE — Progress Notes (Signed)
 Cardiology Office Note:  .   Date:  08/04/2023  ID:  Theresa [name], DOB November 25, 1970, MRN [id number] PCP: Babs Sciara, MD  [facility] Providers Cardiologist:  Dina Rich, MD    History of Present Illness: .   Theresa [name] is a 53 y.o. female with a past medical history of chronic chest pain, fibromyalgia, OCD, depression and anxiety, seizures, GERD, hypertension, migraine headache, obesity, who presents for concerns of elevated blood pressures.   Patient has a long history of atypical chest pain, possibly from anxiety or stress or GERD.  Treadmill test was ordered in 2020, but never performed.  Heart monitor in 2021 for palpitations showed normal sinus rhythm, rare PACs and PVCs, brief atrial runs.  No sustained arrhythmias.  Echo showed EF 65 to 70%, normal RV SF, trivial MR.  Patient was subsequently lost to follow-up.   Patient was admitted in September with chest pain with atypical and typical features.  High-sensitivity troponin was elevated with a flat trend.  Lexiscan showed no ischemia.  Echo showed EF of 60 to 65% with no wall motion abnormality.  Medication management was continued.    She returns to clinic today with continued complaints of worsening stress.  She continues to have chest pain and chest pressure.  She has been having blood pressure spikes on and upwards of 170s systolic.  She states that she has been suffering from increased amount of fatigue over the last week week and.  She does have anxiety that she takes as needed Klonopin for.  She also has a longstanding history of constipation requiring follow-up with a GI specialist that is upcoming.  Upon further discussion she does have family history of coronary artery disease on her mother and father's side.  Recent hospitalization lasted November she had an unrevealing workup with echocardiogram and Lexiscan.  She denies any recent hospitalizations or visits to the emergency department.  ROS: 10 point review  of system has been reviewed and considered negative except what is been listed in the HPI  Studies Reviewed: Marland Kitchen   EKG Interpretation Date/Time:  Thursday August 04 2023 14:23:53 EDT Ventricular Rate:  89 PR Interval:  146 QRS Duration:  78 QT Interval:  390 QTC Calculation: 474 R Axis:   -23  Text Interpretation: Normal sinus rhythm Minimal voltage criteria for LVH, may be normal variant ( R in aVL ) When compared with ECG of 24-Jan-2023 23:19, PREVIOUS ECG IS PRESENT Confirmed by Charlsie Quest ([id number]) on 08/04/2023 2:30:55 PM    Myoview lexiscan 01/2023 Narrative & Impression      The study is normal. There are no perfusion defects. The study is low risk.   No ST deviation was noted.   LV perfusion is normal.   Left ventricular function is normal. Nuclear stress EF: 74%. The left ventricular ejection fraction is hyperdynamic (>65%). End diastolic cavity size is normal.    Echo 01/2023 1. Left ventricular ejection fraction, by estimation, is 55 to 60%. The  left ventricle has normal function. The left ventricle has no regional  wall motion abnormalities. Left ventricular diastolic parameters were  normal. The average left ventricular  global longitudinal strain is -18.1 %. The global longitudinal strain is  normal.   2. Right ventricular systolic function is normal. The right ventricular  size is normal. There is normal pulmonary artery systolic pressure.   3. The mitral valve is normal in structure. Trivial mitral valve  regurgitation. No evidence of mitral stenosis.  4. The tricuspid valve is abnormal.   5. The aortic valve is tricuspid. Aortic valve regurgitation is not  visualized. No aortic stenosis is present.   6. The inferior vena cava is normal in size with greater than 50%  respiratory variability, suggesting right atrial pressure of 3 mmHg.    Risk Assessment/Calculations:             Physical Exam:   VS:  BP 120/78   Pulse 89   Ht 5\' 5"  (1.651 m)   Wt 222 lb  (100.7 kg)   SpO2 98%   BMI 36.94 kg/m    Wt Readings from Last 3 Encounters:  08/04/23 222 lb (100.7 kg)  07/29/23 218 lb (98.9 kg)  07/20/23 219 lb (99.3 kg)    GEN: Well nourished, well developed in no acute distress NECK: No JVD; No carotid bruits CARDIAC: RRR, no murmurs, rubs, gallops RESPIRATORY:  Clear to auscultation without rales, wheezing or rhonchi  ABDOMEN: Soft, non-tender, non-distended EXTREMITIES:  No edema; No deformity   ASSESSMENT AND PLAN: .   Chest pain with typical and atypical features that has been worsening over the last several weeks.  Patient states that she continues to be under an increased amount of stress.  With chest discomfort and chest pressure she has noted worsening spikes in her blood pressure and increasing fatigue.  She previously had an echocardiogram that revealed an LVEF of 55 to 60%, no RWMA, and trivial MR, no other valvular abnormalities and Lexiscan Myoview was considered a low risk scan.  With her continued symptoms and positive troponins during her hospitalization in September with primary family history she has been scheduled for coronary CTA and advised until her test is completed to take aspirin 81 mg daily.  EKG today reveals sinus rhythm with rate of 89 with LVH, left axis deviation, and low voltage.  Hypertension with a blood pressure today 120/78.  Blood pressure is stable today.  She has had upwards spikes in her blood pressure in the 160s and 170s systolic which are accompanied with headaches and visual changes but she has early cataracts and floaters.  She has been encouraged to continue to keep a log of her blood pressures as she is no longer on blood pressure medication.  Previously she was on lisinopril HCTZ 20/25 mg daily.  That medication was discontinued prior to her discharge from the hospital.  With her blood pressure log she has been compliant to keep a log of activity and diet.  Headaches with shooting pains up to the right  side of her head that she is concerned for stroke.  She states it happened approximately 10 times per day and they are sharp.  She states that she does have visual changes but with early onset cataracts and floaters it is difficult for her to ascertain if her visual changes are from a spikes in her blood pressure or from early onset cataracts.  Obesity with a BMI of 36.94.  States that she feels swollen and inflamed all over.  States that since menopause she is continue to gain weight but would benefit from weight management.       Dispo: Patient return to clinic to see MD/APP in 6 weeks or sooner after testing is completed  Signed, Kahner Yanik, NP

## 2023-08-05 LAB — BASIC METABOLIC PANEL WITH GFR
BUN/Creatinine Ratio: 12 (ref 9–23)
BUN: 10 mg/dL (ref 6–24)
CO2: 25 mmol/L (ref 20–29)
Calcium: 9.4 mg/dL (ref 8.7–10.2)
Chloride: 102 mmol/L (ref 96–106)
Creatinine, Ser: 0.85 mg/dL (ref 0.57–1.00)
Glucose: 95 mg/dL (ref 70–99)
Potassium: 4.3 mmol/L (ref 3.5–5.2)
Sodium: 141 mmol/L (ref 134–144)
eGFR: 82 mL/min/{1.73_m2} (ref >59)

## 2023-08-05 NOTE — Progress Notes (Signed)
Normal kidney function and electrolytes.

## 2023-08-09 ENCOUNTER — Ambulatory Visit: Admitting: Cardiology

## 2023-08-23 ENCOUNTER — Encounter (HOSPITAL_COMMUNITY): Payer: Self-pay

## 2023-08-25 ENCOUNTER — Ambulatory Visit
Admission: RE | Admit: 2023-08-25 | Discharge: 2023-08-25 | Disposition: A | Discharge status: Home / Self Care | Source: Ambulatory Visit | Attending: Cardiology | Admitting: Cardiology

## 2023-08-25 DIAGNOSIS — R0789 Other chest pain: Secondary | ICD-10-CM | POA: Diagnosis present

## 2023-08-25 DIAGNOSIS — R079 Chest pain, unspecified: Secondary | ICD-10-CM | POA: Insufficient documentation

## 2023-08-25 DIAGNOSIS — I10 Essential (primary) hypertension: Secondary | ICD-10-CM | POA: Diagnosis present

## 2023-08-25 MED ORDER — SODIUM CHLORIDE 0.9 % IV SOLN: INTRAVENOUS | Status: DC

## 2023-08-25 MED ORDER — METOPROLOL TARTRATE 5 MG/5ML IV SOLN: 10.0000 mg | Freq: Once | INTRAVENOUS | Status: DC | PRN

## 2023-08-25 MED ORDER — DILTIAZEM HCL 25 MG/5ML IV SOLN: 10.0000 mg | INTRAVENOUS | Status: DC | PRN

## 2023-08-25 MED ORDER — NITROGLYCERIN 0.4 MG SL SUBL
0.8000 mg | SUBLINGUAL_TABLET | Freq: Once | SUBLINGUAL | Status: AC
  Administered 2023-08-25: 0.8 mg via SUBLINGUAL

## 2023-08-25 MED ORDER — IOHEXOL 350 MG/ML SOLN
100.0000 mL | Freq: Once | INTRAVENOUS | Status: AC | PRN
  Administered 2023-08-25: 100 mL via INTRAVENOUS

## 2023-08-25 NOTE — Progress Notes (Signed)
Patient tolerated procedure well. Ambulate w/o difficulty. Denies light headedness or being dizzy. Encouraged to drink extra water today and reasoning explained. Verbalized understanding. All questions answered. ABC intact. No further needs. Discharge from procedure area w/o issues.   

## 2023-08-26 ENCOUNTER — Telehealth: Payer: Self-pay

## 2023-08-26 ENCOUNTER — Telehealth: Payer: Self-pay | Admitting: Gastroenterology

## 2023-08-26 ENCOUNTER — Ambulatory Visit: Admitting: Nurse Practitioner

## 2023-08-26 VITALS — BP 127/79 | HR 80 | Temp 98.4°F | Ht 65.0 in | Wt 222.4 lb

## 2023-08-26 DIAGNOSIS — F419 Anxiety disorder, unspecified: Secondary | ICD-10-CM

## 2023-08-26 DIAGNOSIS — K219 Gastro-esophageal reflux disease without esophagitis: Secondary | ICD-10-CM | POA: Diagnosis not present

## 2023-08-26 DIAGNOSIS — F32A Depression, unspecified: Secondary | ICD-10-CM

## 2023-08-26 DIAGNOSIS — K581 Irritable bowel syndrome with constipation: Secondary | ICD-10-CM

## 2023-08-26 DIAGNOSIS — I10 Essential (primary) hypertension: Secondary | ICD-10-CM

## 2023-08-26 NOTE — Telephone Encounter (Signed)
 Good afternoon Dr. Karene Oto  The following patient is being referred to us  for Irritable bowel syndrome with constipation, Rectocele, Gastroesophageal reflux disease without esophagitis and Chronic constipation. She has been a patient of [facility] and wishes to find new care because they will not help to get her well. She needs someone that can "make her a priority." Records are in Epic care everywhere. Please review and advise of scheduling. Thank you.

## 2023-08-26 NOTE — Telephone Encounter (Signed)
 The patient has been notified of the result and verbalized understanding.  All questions (if any) were answered. Casper Clement, [facility] 08/26/2023 10:17 AM

## 2023-08-26 NOTE — Telephone Encounter (Signed)
-----   Message from [facility] sent at 08/26/2023 10:08 AM EDT ----- Calcium  score of 0. No concerns for coronary artery disease. Continue with primary prevention of maintaining blood pressure and cholesterol. Mild dilatation of the ascending aorta. Recommend annual screening to ensure no change in size with annual CT  scan. OK to stop taking ASA 81 mg daily. Continue the remainder of medication regimen and keep follow up appointments as scheduled.

## 2023-08-26 NOTE — Progress Notes (Signed)
 Calcium  score of 0. No concerns for coronary artery disease. Continue with primary prevention of maintaining blood pressure and cholesterol. Mild dilatation of the ascending aorta. Recommend annual screening to ensure no change in size with annual CT scan. OK to stop taking ASA 81 mg daily. Continue the remainder of medication regimen and keep follow up appointments as scheduled.

## 2023-08-27 ENCOUNTER — Encounter: Payer: Self-pay | Admitting: Nurse Practitioner

## 2023-08-27 MED ORDER — FLUOXETINE HCL 10 MG PO TABS: 10.0000 mg | ORAL_TABLET | Freq: Every day | ORAL | 0 refills | Status: DC

## 2023-08-27 NOTE — Progress Notes (Signed)
 Subjective:    Patient ID: Theresa [name], female    DOB: [birth year], 53 y.o.   MRN: [id number]  HPI Presents for complaints of a significant flareup of her acid reflux disease for over a month.  Currently on daily Dexilant .  Has been followed by gastroenterology.  Recently tried to make an appointment with a second GI specialist regarding her rectocele and difficulty defecating.  States that was shut down very quickly.  Has been seen by our local GI specialist in the past.  Nausea but no vomiting.  Drinks 2 caffeinated sodas per day.  No alcohol use.  No tobacco use.  No NSAID use.  No excessive spicy or citrusy foods.  States she stays constantly bloated especially after meals.  Having small soft BMs, never feels fully evacuated.  Recently had a cardiac CT through her cardiologist.  Has had a cardiac workup.  Having random spells of feeling hot, nauseated with palpitations.  This is associated with spikes in her blood pressure with the highest recently 150/110.  No noted triggers.  Can occur at any time.  No syncopal episodes.  Continues to have significant issues with depression and anxiety.   Review of Systems  Constitutional:  Positive for fatigue. Negative for fever.  HENT:  Negative for sore throat and trouble swallowing.   Respiratory:  Negative for cough, chest tightness and shortness of breath.   Cardiovascular:  Positive for palpitations. Negative for chest pain.  Gastrointestinal:  Positive for abdominal pain, constipation and nausea. Negative for blood in stool, diarrhea and vomiting.  Neurological:  Negative for syncope, facial asymmetry, speech difficulty, weakness and numbness.  Psychiatric/Behavioral:  Negative for suicidal ideas.        01/28/2023    1:41 PM  Depression screen PHQ 2/9  Decreased Interest 2  Down, Depressed, Hopeless 3  PHQ - 2 Score 5  Altered sleeping 3  Tired, decreased energy 3  Change in appetite 0  Feeling bad or failure about yourself  3   Trouble concentrating 3  Moving slowly or fidgety/restless 0  Suicidal thoughts 0  PHQ-9 Score 17  Difficult doing work/chores Very difficult      01/28/2023    1:42 PM 11/12/2022    4:23 PM 07/16/2022    4:24 PM 06/07/2022    9:14 AM  GAD 7 : Generalized Anxiety Score  Nervous, Anxious, on Edge 3 1 0 2  Control/stop worrying 3 2 0 3  Worry too much - different things 3 2 0 3  Trouble relaxing 3 2 1 2   Restless 3 0 0 1  Easily annoyed or irritable 3 2 1 2   Afraid - awful might happen 0 0 1 2  Total GAD 7 Score 18 9 3 15   Anxiety Difficulty Very difficult Somewhat difficult Not difficult at all    Social History   Tobacco Use   Smoking status: Never   Smokeless tobacco: Never  Vaping Use   Vaping status: Never Used  Substance Use Topics   Alcohol use: Not Currently    Comment: occas   Drug use: No        Objective:   Physical Exam NAD.  Alert, oriented.  Calm cheerful affect.  Slightly tearful at times.  Speech clear.  Making good eye contact.  Dressed appropriately for the weather.  Normal judgment and behavior.  Thyroid  nontender to palpation, no mass or goiter noted.  Lungs clear.  Heart regular rate rhythm.  No murmur or gallop noted.  Abdomen soft mildly distended with active bowel sounds x 4.  Distinct epigastric area tenderness noted on exam.  No rebound or guarding.  No obvious masses.  Today's Vitals   08/26/23 1512  BP: 127/79  Pulse: 80  Temp: 98.4 F (36.9 C)  SpO2: 98%  Weight: 222 lb 6.4 oz (100.9 kg)  Height: 5\' 5"  (1.651 m)   Body mass index is 37.01 kg/m.      Assessment & Plan:   Problem List Items Addressed This Visit       Cardiovascular and Mediastinum   Essential hypertension     Digestive   Gastroesophageal reflux disease without esophagitis - Primary   Irritable bowel syndrome with constipation     Other   Anxiety and depression   Relevant Medications   FLUoxetine (PROZAC) 10 MG tablet      Meds ordered this encounter   Medications   FLUoxetine (PROZAC) 10 MG tablet    Sig: Take 1 tablet (10 mg total) by mouth daily.    Dispense:  30 tablet    Refill:  0    Supervising Provider:   Bennet Brasil [phone]  Based on the description, it appears patient is having an adrenal surge which is most likely due to her anxiety.  Trial of low-dose fluoxetine with plans to titrate if needed and tolerated.  If symptoms persist, recommend discussion with her cardiologist or possible referral to endocrinology to evaluate for adrenal issues.  Potential adverse effects discussed.  Discontinue medication and contact office if any problems.  Otherwise follow-up in 1 month for an in person or video visit for recheck.  To monitor the spells that she is having with blood pressure and palpitations to see if these improve over time. Recommend patient schedule follow-up with [facility] due to breakthrough symptoms on Dexilant .  Also they will be able to refer her to the appropriate specialist if needed for evacuation issues.  She agrees with this plan.

## 2023-08-30 ENCOUNTER — Encounter: Payer: Self-pay | Admitting: Nurse Practitioner

## 2023-08-30 ENCOUNTER — Encounter: Payer: Self-pay | Admitting: Cardiology

## 2023-08-30 NOTE — Telephone Encounter (Signed)
 She still have previously prescribed lisinopril /HCTZ 20/25 mg that she was on prior to her hospital admission?  If she continues to have that dose at home would recommend cutting the tablet in half and take half a tablet once daily if she needs a new prescription please send a new prescription to the pharmacy of choice.  Recommend that she take her blood pressure 1 to 2 hours postmedication administration.  She will need BMP in 1 week after restarting the medication.

## 2023-08-31 ENCOUNTER — Other Ambulatory Visit: Payer: Self-pay

## 2023-08-31 ENCOUNTER — Other Ambulatory Visit: Payer: Self-pay | Admitting: Nurse Practitioner

## 2023-08-31 DIAGNOSIS — R079 Chest pain, unspecified: Secondary | ICD-10-CM

## 2023-09-01 ENCOUNTER — Ambulatory Visit: Attending: Cardiology

## 2023-09-01 ENCOUNTER — Telehealth: Payer: Self-pay | Admitting: Cardiology

## 2023-09-01 ENCOUNTER — Ambulatory Visit: Admitting: Cardiology

## 2023-09-01 DIAGNOSIS — R002 Palpitations: Secondary | ICD-10-CM

## 2023-09-01 NOTE — Telephone Encounter (Signed)
 Continue on current medication regimen she should monitor her blood pressure 1 to 2 hours after she is taking her medication.  We anticipate her blood pressure to be higher before she takes her medication and then after she is taking her medications.  With her keeping a blood pressure log of her blood pressures after her medications it allows us  to know the effectiveness of her current regimen.  Also with her palpitations and fast heart rates it may be beneficial to repeat a ZIO XT monitor for 2 weeks.  She was previously advised to restart her blood pressure medication 2 days ago and repeat a BMP in 2 weeks after restarting the medication to reevaluate her kidney function we should also add a magnesium onto that lab work.

## 2023-09-01 NOTE — Telephone Encounter (Signed)
 Patient states been having BP concerns here are A few readings, please call & advise thanks  4/17-175/110   4/19-150/100  4/20-160/106

## 2023-09-01 NOTE — Telephone Encounter (Signed)
 Patient had numerous concerns and wanted answers. She reports waking up with elevated heart rates which feels like she has been running. She also has chest pain with that as well. Elevated BP readings are when she gets up in the morning. Instructed her to monitor BP's 2 hours after medication. The last 2 days her BP was 125/87 & 129/89. However all other BP readings have been elevated at the 170/110, 150/100. 160/106. Inquired if she was taking the  Lisinopril /hydrochlorothiazide  20/25 and she is taking whole pill. She wanted to know why her BP is higher before medications. She then states that she wants someone who listens, talk to her, and answer her questions, and what can she do. Advised that I would forward this message over to provider and then give her a call back with any recommendations she may have.

## 2023-09-01 NOTE — Telephone Encounter (Signed)
 Reviewed providers recommendations in detail and she verbalized understanding. She was very appreciative and had no further questions at this time.

## 2023-09-07 ENCOUNTER — Ambulatory Visit
Admission: EM | Admit: 2023-09-07 | Discharge: 2023-09-07 | Disposition: A | Discharge status: Home / Self Care | Attending: Nurse Practitioner | Admitting: Nurse Practitioner

## 2023-09-07 ENCOUNTER — Encounter: Payer: Self-pay | Admitting: Emergency Medicine

## 2023-09-07 DIAGNOSIS — S30860A Insect bite (nonvenomous) of lower back and pelvis, initial encounter: Secondary | ICD-10-CM | POA: Diagnosis not present

## 2023-09-07 DIAGNOSIS — R519 Headache, unspecified: Secondary | ICD-10-CM | POA: Diagnosis not present

## 2023-09-07 DIAGNOSIS — W57XXXA Bitten or stung by nonvenomous insect and other nonvenomous arthropods, initial encounter: Secondary | ICD-10-CM | POA: Diagnosis not present

## 2023-09-07 MED ORDER — DOXYCYCLINE HYCLATE 100 MG PO CAPS
100.0000 mg | ORAL_CAPSULE | Freq: Two times a day (BID) | ORAL | 0 refills | Status: DC
Start: 2023-09-07 — End: 2023-10-07

## 2023-09-07 NOTE — Discharge Instructions (Addendum)
 Plan treatment with doxycycline twice daily for 10 days.  Monitor closely for worsening headache, spreading rash, fever, chills, or neck pain.  See your primary care provider for further evaluation and management.

## 2023-09-07 NOTE — ED Provider Notes (Signed)
 [facility]    CSN: [id number] Arrival date & time: 09/07/23  1742      History   Chief Complaint Chief Complaint  Patient presents with   Rash    HPI Theresa [name] is a 53 y.o. female.   Patient is here requesting evaluation after she was bitten by a tick approximately 10 days ago.  Patient states she pulled a tick off of her lower back.  There was also another tick that she removed from the back of her neck 1 week prior to that.  She has also found several others on her body.  Attributes it to her child's golden retriever who likes to run through the woods.  They have tried tick treatments already.  Patient states she did not think much of it until she has now developed a reddened rash to her left lateral hip region and also a headache which is atypical for her.  She did try Tylenol  650 mg without any alleviation of the discomfort.  No reported fever, body aches, chills, chest pain, shortness of breath, abdominal pain, vomiting, or diarrhea.  The history is provided by the patient.  Rash Associated symptoms: headaches   Associated symptoms: no abdominal pain, no diarrhea, no fatigue, no fever, no joint pain, no myalgias, no shortness of breath, no sore throat and not vomiting     Past Medical History:  Diagnosis Date   Anxiety    Back pain    Borderline diabetic    Bruises easily 06/07/2022   CFS (chronic fatigue syndrome)    Constipation    COVID-19 12/21/2019   Depression    Diverticular disease    Dizzy 06/07/2022   Elevated liver enzymes    Encounter for screening fecal occult blood testing 04/12/2022   Encounter for well woman exam with routine gynecological exam 04/12/2022   Epilepsy (HCC)    Fatty liver    Fibromyalgia    Gallbladder problem    GERD (gastroesophageal reflux disease)    Headache 04/12/2012   Headache(784.0)    Heart murmur    History of degenerative disc disease    Hyperglycemia 09/18/2012   Hyperplastic colon polyp 10/28/2019    Dr Merrilee Abernethy recommends repeat colonoscopy in 2028.   Hypertension    IBS (irritable bowel syndrome) w/ constipation    IFG (impaired fasting glucose)    Joint pain    Kidney problem    Lactose intolerance    Lower abdominal pain 04/12/2012   Migraines    Nausea without vomiting 02/07/2014   Obsessive-compulsive disorder    Osteoarthritis    S/P hysterectomy 04/12/2022   Screening cholesterol level 04/12/2022   Screening examination for STD (sexually transmitted disease) 04/12/2022   Screening for diabetes mellitus 04/12/2022   Screening for thyroid  disorder 04/12/2022   Seizures (HCC)    last seizure at age 41-stressed induced seizure.   SOB (shortness of breath)    Swallowing difficulty    Torn meniscus    Vertigo 06/07/2022   Vitamin D  deficiency     Patient Active Problem List   Diagnosis Date Noted   Elevated troponin 01/25/2023   Migraine headache 01/25/2023   Atypical chest pain 01/24/2023   Chronic constipation 06/15/2022   Dyspareunia in female 04/12/2022   Recurrent UTI 12/11/2021   SUI (stress urinary incontinence, female) 12/11/2021   Urinary urgency 12/11/2021   Overactive bladder 12/11/2021   Hypoestrogenism 12/11/2021   Nocturnal leg cramps 06/28/2021   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/02/2020  Attention deficit disorder (ADD) without hyperactivity 11/15/2019   Hyperplastic colon polyp 10/28/2019   History of adenomatous polyp of colon 08/28/2019   Dysphagia 08/28/2019   Acute diverticulitis 08/13/2019   Hypokalemia 08/13/2019   Elevated liver transaminase level    Hair loss 07/11/2019   Brain fog 07/11/2019   Rectocele 07/11/2019   Sleep disturbance 07/11/2019   Gastroesophageal reflux disease without esophagitis 05/21/2019   Cervical radiculopathy 02/27/2019   Essential hypertension 02/27/2019   Adhesive capsulitis of right shoulder 11/06/2018   Prediabetes 11/26/2017   Class 2 severe obesity due to excess calories with serious comorbidity and  body mass index (BMI) of 35.0 to 35.9 in adult [facility]) 04/10/2016   Diverticulosis of large intestine 12/05/2015   BRCA negative 02/10/2015   IFG (impaired fasting glucose) 07/05/2014   Anxiety 04/27/2014   Abdominal pain, epigastric 02/07/2014   Other fatigue 09/18/2012   Anxiety and depression 07/14/2012   Insomnia due to mental disorder 07/14/2012   Irritable bowel syndrome with constipation 04/12/2012    Past Surgical History:  Procedure Laterality Date   ABDOMINAL HYSTERECTOMY     still has right ovary   BIOPSY N/A 03/05/2014   Procedure: DUODENAL AND GASTRIC BIOPSY;  Surgeon: Alyce Jubilee, MD;  Location: [facility];  Service: Endoscopy;  Laterality: N/A;   bladder sling X 3     CHILECTOMY Left 03/12/2020   Procedure: CHILECTOMY HALLUX;  Surgeon: Barbra Ley, DPM;  Location: [facility];  Service: Podiatry;  Laterality: Left;   CHOLECYSTECTOMY     COLONOSCOPY  04/24/2012   [id number] polyp measuring 5 mm in size was found in the proximal transverse colon; polypectomy was performed with cold forceps Pedunculated polyp measuring 1.1 cm in size was found in the sigmoid colon; polypectomy was performed using snare cautery Moderate diverticulosis was noted in the descending colon and sigmoid colon small internal hemorrhoids. next tcs 04/2017   COLONOSCOPY WITH PROPOFOL  N/A 10/23/2019   sigmoid and descending colon diverticulosis, one 3 mm polyp in cecum, one 4 mm polyp in descending colon. Hyperplastic   ESOPHAGOGASTRODUODENOSCOPY N/A 02/18/2014   Procedure: ESOPHAGOGASTRODUODENOSCOPY (EGD);  Surgeon: Alyce Jubilee, MD;  Location: [facility];  Service: Endoscopy;  Laterality: N/A;  115   ESOPHAGOGASTRODUODENOSCOPY (EGD) WITH PROPOFOL  N/A 03/05/2014   SLF: 1. dyspepsia due to constipation, gastritis, and GERD 2. Multiple Gastric polyps 3. Moderate non-erosive gastritis 4. Diverticulum in the 2nd part of the duodenum   ESOPHAGOGASTRODUODENOSCOPY (EGD) WITH PROPOFOL  N/A 10/23/2019    normal esophagus s/p dilation, normal stomach, normal duodenum.   MALONEY DILATION N/A 10/23/2019   Procedure: Londa Rival DILATION;  Surgeon: Suzette Espy, MD;  Location: [facility];  Service: Endoscopy;  Laterality: N/A;   POLYPECTOMY  10/23/2019   Procedure: POLYPECTOMY;  Surgeon: Suzette Espy, MD;  Location: [facility];  Service: Endoscopy;;   TUBAL LIGATION      OB History     Gravida  2   Para  2   Term      Preterm      AB  0   Living  2      SAB      IAB      Ectopic  0   Multiple      Live Births  2            Home Medications    Prior to Admission medications   Medication Sig Start Date End Date Taking? Authorizing Provider  albuterol  (VENTOLIN  HFA)  108 (90 Base) MCG/ACT inhaler Inhale 2 puffs into the lungs every 4 (four) hours as needed for wheezing or shortness of breath. 05/10/23  Yes Derenda Flax, NP  budesonide -formoterol  (SYMBICORT ) 80-4.5 MCG/ACT inhaler Inhale 2 puffs into the lungs 2 (two) times daily. For cough and wheezing 05/10/23  Yes Michelle Aid C, NP  cholecalciferol (VITAMIN D3) 25 MCG (1000 UNIT) tablet Take 1,000 Units by mouth daily.   Yes [provider]  clonazePAM  (KLONOPIN ) 0.5 MG tablet Take 1/2 - 1 tab po BID for extreme anxiety 07/20/23  Yes Hoskins, Carolyn C, NP  dexlansoprazole  (DEXILANT ) 60 MG capsule TAKE ONE CAPSULE BY MOUTH ONCE DAILY 08/15/23  Yes Delman Ferns, NP  doxycycline (VIBRAMYCIN) 100 MG capsule Take 1 capsule (100 mg total) by mouth 2 (two) times daily. 09/07/23  Yes Genene Kennel, FNP  FLUoxetine  (PROZAC ) 10 MG tablet Take 1 tablet (10 mg total) by mouth daily. 08/27/23  Yes Michelle Aid C, NP  magnesium oxide (MAG-OX) 400 MG tablet daily.   Yes [provider]  metoprolol  tartrate (LOPRESSOR ) 50 MG tablet TAKE BOTH  TABLETS  2 HR PRIOR TO CARDIAC PROCEDURE 08/04/23  Yes Ronald Cockayne, NP  ondansetron  (ZOFRAN ) 8 MG tablet TAKE ONE TABLET BY MOUTH THREE TIMES DAILY AS NEEDED FOR  NAUSEA 12/01/22  Yes Delman Ferns, NP    Family History Family History  Problem Relation Age of Onset   Drug abuse Mother    Bipolar disorder Mother    Cancer Mother    Kidney disease Mother    Alcohol abuse Mother    Depression Mother    Anxiety disorder Mother    Paranoid behavior Mother    Hypertension Mother    Diabetes Mother    Breast cancer Mother    Obesity Mother    Obesity Father    Cancer Father    Stroke Father    Alcohol abuse Father    Hypertension Father    Diabetes Father    Physical abuse Brother    Healthy Daughter    GER disease Son    Healthy Son    Colon cancer Neg Hx    ADD / ADHD Neg Hx    Dementia Neg Hx    OCD Neg Hx    Schizophrenia Neg Hx    Seizures Neg Hx    Sexual abuse Neg Hx     Social History Social History   Tobacco Use   Smoking status: Never   Smokeless tobacco: Never  Vaping Use   Vaping status: Never Used  Substance Use Topics   Alcohol use: Not Currently    Comment: occas   Drug use: No     Allergies   Fentanyl , Penicillins, and Prednisone    Review of Systems Review of Systems  Constitutional:  Negative for chills, fatigue and fever.  HENT:  Negative for congestion, rhinorrhea and sore throat.   Eyes:  Negative for pain and redness.  Respiratory:  Negative for cough and shortness of breath.   Cardiovascular:  Negative for chest pain and palpitations.  Gastrointestinal:  Negative for abdominal pain, diarrhea and vomiting.  Genitourinary:  Negative for dysuria.  Musculoskeletal:  Negative for arthralgias and myalgias.  Skin:  Positive for rash.       Positive for tick bite.   Neurological:  Positive for headaches. Negative for dizziness.     Physical Exam Triage Vital Signs ED Triage Vitals  Encounter Vitals Group     BP 09/07/23  1829 130/85     Systolic BP Percentile --      Diastolic BP Percentile --      Pulse Rate 09/07/23 1829 74     Resp 09/07/23 1829 18     Temp 09/07/23 1829 98.6 F (37 C)      Temp Source 09/07/23 1829 Oral     SpO2 09/07/23 1829 97 %     Weight 09/07/23 1831 220 lb (99.8 kg)     Height 09/07/23 1831 5\' 5"  (1.651 m)     Head Circumference --      Peak Flow --      Pain Score 09/07/23 1830 0     Pain Loc --      Pain Education --      Exclude from Growth Chart --    No data found.  Updated Vital Signs BP 130/85 (BP Location: Right Arm)   Pulse 74   Temp 98.6 F (37 C) (Oral)   Resp 18   Ht 5\' 5"  (1.651 m)   Wt 220 lb (99.8 kg)   SpO2 97%   BMI 36.61 kg/m   Physical Exam Vitals and nursing note reviewed.  Constitutional:      Appearance: Normal appearance.  HENT:     Right Ear: Tympanic membrane and ear canal normal.     Left Ear: Tympanic membrane and ear canal normal.     Nose: Nose normal. No congestion.     Mouth/Throat:     Mouth: Mucous membranes are moist.     Pharynx: No posterior oropharyngeal erythema.  Eyes:     Extraocular Movements: Extraocular movements intact.     Conjunctiva/sclera: Conjunctivae normal.     Pupils: Pupils are equal, round, and reactive to light.  Cardiovascular:     Rate and Rhythm: Normal rate and regular rhythm.     Heart sounds: Normal heart sounds.  Pulmonary:     Effort: Pulmonary effort is normal.     Breath sounds: Normal breath sounds.  Abdominal:     General: Bowel sounds are normal.  Musculoskeletal:     Cervical back: Normal range of motion.  Skin:    General: Skin is warm and dry.     Findings: Rash present.     Comments: Erythematous macular lesion to the left lateral hip region measuring approximately 5 cm x 4 cm.  No overlying vesicles, pustules, or papules noted.  This is an isolated rash.  Neurological:     General: No focal deficit present.     Mental Status: She is alert and oriented to person, place, and time.  Psychiatric:        Mood and Affect: Mood normal.        Behavior: Behavior normal.        Thought Content: Thought content normal.        Judgment: Judgment normal.      UC Treatments / Results  Labs (all labs ordered are listed, but only abnormal results are displayed) Labs Reviewed - No data to display  EKG   Radiology No results found.  Procedures Procedures (including critical care time)  Medications Ordered in UC Medications - No data to display  Initial Impression / Assessment and Plan / UC Course  I have reviewed the triage vital signs and the nursing notes.  Pertinent labs & imaging results that were available during my care of the patient were reviewed by me and considered in my medical decision making (see chart for  details).     Patient presents requesting evaluation for the new onset of a rash and a headache after being bitten by multiple ticks over the course of several weeks.  Based upon this history, reasonable to provide antibiotic therapy which is targeted towards tickborne illness.  I have encouraged her to follow-up with her primary care provider to discuss laboratory testing and the necessity for any additional evaluation and management.  Final Clinical Impressions(s) / UC Diagnoses   Final diagnoses:  Tick bite of lower back, initial encounter  Acute nonintractable headache, unspecified headache type     Discharge Instructions      Plan treatment with doxycycline twice daily for 10 days.  Monitor closely for worsening headache, spreading rash, fever, chills, or neck pain.  See your primary care provider for further evaluation and management.     ED Prescriptions     Medication Sig Dispense Auth. Provider   doxycycline (VIBRAMYCIN) 100 MG capsule Take 1 capsule (100 mg total) by mouth 2 (two) times daily. 20 capsule Genene Kennel, FNP      PDMP not reviewed this encounter.   Genene Kennel, FNP 09/07/23 1925

## 2023-09-07 NOTE — ED Triage Notes (Signed)
 Patient states that she pulled a tick off of her back/buttocks area x 10 days ago.  Now patient has developed a rash on her left leg and a HA.  Patient has taken Tylenol  325mg  w/o relief.

## 2023-09-09 ENCOUNTER — Ambulatory Visit: Admitting: Nurse Practitioner

## 2023-09-09 VITALS — BP 155/86 | HR 86 | Temp 98.1°F | Wt 223.0 lb

## 2023-09-09 DIAGNOSIS — S30860D Insect bite (nonvenomous) of lower back and pelvis, subsequent encounter: Secondary | ICD-10-CM | POA: Diagnosis not present

## 2023-09-09 DIAGNOSIS — W57XXXD Bitten or stung by nonvenomous insect and other nonvenomous arthropods, subsequent encounter: Secondary | ICD-10-CM | POA: Diagnosis not present

## 2023-09-09 MED ORDER — CELECOXIB 100 MG PO CAPS: 100.0000 mg | ORAL_CAPSULE | Freq: Two times a day (BID) | ORAL | 0 refills | Status: DC

## 2023-09-09 NOTE — Progress Notes (Unsigned)
   Subjective:    Patient ID: Theresa [name], female    DOB: May 01, 1971, 53 y.o.   MRN: [id number]  HPI Follow up urgent care ticks removed 2 weeks ago  C/o headache , body aches, rash on left side  Has heart monitor  in place  Was seen in urgent care on 09/07/2023 for complaints of a rash on her left lower back area following a tick bite.  Removed several ticks about 2 weeks ago.  Was started on doxycycline .  Rashes improved slightly.  Mildly pruritic.  Headaches were intense at first but slightly better.  Does have body aches and some joint pain.  No erythema or edema of the joints.  No other rash.  No fever at this point.  Review of Systems  Constitutional:  Positive for fatigue.  Respiratory:  Negative for cough, chest tightness, shortness of breath and wheezing.   Cardiovascular:  Negative for chest pain.       Objective:   Physical Exam NAD.  Alert, oriented.  Lungs clear.  Heart regular rate rhythm.  Patient has pictures of the rash initially.  There does not appear to be an early bull's-eye lesion at that time.  Today the area on her left lower back is less raised and less erythematous.  Appears to be fading. Today's Vitals   09/09/23 1521  BP: (!) 155/86  Pulse: 86  Temp: 98.1 F (36.7 C)  SpO2: 96%  Weight: 223 lb (101.2 kg)   Body mass index is 37.11 kg/m.        Assessment & Plan:   Problem List Items Addressed This Visit       Musculoskeletal and Integument   Tick bite of lower back - Primary   Meds ordered this encounter  Medications   celecoxib  (CELEBREX ) 100 MG capsule    Sig: Take 1 capsule (100 mg total) by mouth 2 (two) times daily. Prn pain    Dispense:  30 capsule    Refill:  0    Supervising Provider:   Charlotta Cook A [id number]   Prescription of Celebrex  as directed for any pain.  Take with food.  Discontinue if any reflux symptoms or stomach upset.  Note the patient denies any allergies to sulfa  drugs. Complete doxycycline  as directed.  Expect  gradual resolution of her symptoms over the next week.  Warning signs reviewed.  Go to ED or urgent care over the weekend if worse, call back if symptoms persist.

## 2023-09-10 ENCOUNTER — Encounter: Payer: Self-pay | Admitting: Nurse Practitioner

## 2023-09-10 DIAGNOSIS — W57XXXA Bitten or stung by nonvenomous insect and other nonvenomous arthropods, initial encounter: Secondary | ICD-10-CM | POA: Insufficient documentation

## 2023-09-13 ENCOUNTER — Encounter: Payer: Self-pay | Admitting: Nurse Practitioner

## 2023-09-13 ENCOUNTER — Ambulatory Visit: Admitting: Cardiology

## 2023-09-27 ENCOUNTER — Ambulatory Visit: Admitting: Cardiology

## 2023-10-05 LAB — BASIC METABOLIC PANEL WITH GFR
BUN/Creatinine Ratio: 16 (ref 9–23)
BUN: 12 mg/dL (ref 6–24)
CO2: 24 mmol/L (ref 20–29)
Calcium: 9.2 mg/dL (ref 8.7–10.2)
Chloride: 104 mmol/L (ref 96–106)
Creatinine, Ser: 0.75 mg/dL (ref 0.57–1.00)
Glucose: 79 mg/dL (ref 70–99)
Potassium: 4.4 mmol/L (ref 3.5–5.2)
Sodium: 143 mmol/L (ref 134–144)
eGFR: 96 mL/min/{1.73_m2} (ref >59)

## 2023-10-06 ENCOUNTER — Encounter: Payer: Self-pay | Admitting: Nurse Practitioner

## 2023-10-06 ENCOUNTER — Ambulatory Visit: Payer: Self-pay | Admitting: Cardiology

## 2023-10-06 NOTE — Progress Notes (Signed)
 Kidney function and electrolytes have remained stable.  Continue current medication regimen without changes at this time.

## 2023-10-07 ENCOUNTER — Encounter: Payer: Self-pay | Admitting: Nurse Practitioner

## 2023-10-07 ENCOUNTER — Ambulatory Visit: Attending: Cardiology | Admitting: Cardiology

## 2023-10-07 ENCOUNTER — Encounter: Payer: Self-pay | Admitting: Cardiology

## 2023-10-07 VITALS — BP 120/80 | HR 91 | Ht 65.0 in | Wt 224.5 lb

## 2023-10-07 DIAGNOSIS — R002 Palpitations: Secondary | ICD-10-CM | POA: Diagnosis present

## 2023-10-07 DIAGNOSIS — R0602 Shortness of breath: Secondary | ICD-10-CM | POA: Diagnosis present

## 2023-10-07 DIAGNOSIS — R0789 Other chest pain: Secondary | ICD-10-CM | POA: Insufficient documentation

## 2023-10-07 DIAGNOSIS — Z6835 Body mass index (BMI) 35.0-35.9, adult: Secondary | ICD-10-CM | POA: Insufficient documentation

## 2023-10-07 DIAGNOSIS — I10 Essential (primary) hypertension: Secondary | ICD-10-CM | POA: Diagnosis present

## 2023-10-07 DIAGNOSIS — I77819 Aortic ectasia, unspecified site: Secondary | ICD-10-CM | POA: Diagnosis present

## 2023-10-07 DIAGNOSIS — E66812 Obesity, class 2: Secondary | ICD-10-CM | POA: Diagnosis present

## 2023-10-07 MED ORDER — METOPROLOL SUCCINATE ER 25 MG PO TB24: 12.5000 mg | ORAL_TABLET | Freq: Every day | ORAL | 3 refills | Status: DC

## 2023-10-07 NOTE — Patient Instructions (Signed)
 Medication Instructions:  Your physician recommends the following medication changes.  START TAKING: Metoprolol  12.5 mg ( 0.5 tab) daily  *If you need a refill on your cardiac medications before your next appointment, please call your pharmacy*  Lab Work: No labs ordered today  If you have labs (blood work) drawn today and your tests are completely normal, you will receive your results only by: MyChart Message (if you have MyChart) OR A paper copy in the mail If you have any lab test that is abnormal or we need to change your treatment, we will call you to review the results.  Testing/Procedures: No test ordered today   Follow-Up: At [facility], you and your health needs are our priority.  As part of our continuing mission to provide you with exceptional heart care, our providers are all part of one team.  This team includes your primary Cardiologist (physician) and Advanced Practice Providers or APPs (Physician Assistants and Nurse Practitioners) who all work together to provide you with the care you need, when you need it.  Your next appointment:   6 week(s)  Provider:   Ronald Cockayne, NP

## 2023-10-07 NOTE — Addendum Note (Signed)
 Addended by: Theressa Piedra D on: 10/07/2023 04:35 PM   Modules accepted: Orders

## 2023-10-07 NOTE — Progress Notes (Signed)
 Cardiology Office Note   Date:  10/07/2023  ID:  Theresa [name], DOB [birth year], MRN [id number] PCP: Bennet Brasil, MD  [facility] Providers Cardiologist:  Armida Lander, MD     History of Present Illness Theresa [name] is a 53 y.o. female with a past medical history of chronic chest pain, fibromyalgia, OCD, depression and anxiety, seizures, GERD, hypertension, migraine headaches, obesity, who presents today for follow-up.   Patient has a long history of atypical chest pain, possibly from anxiety or stress or GERD.  Treadmill test was ordered in 2020, but never performed.  Heart monitor in 2021 for palpitations showed normal sinus rhythm, rare PACs and PVCs, brief atrial runs.  No sustained arrhythmias.  Echo showed EF 65 to 70%, normal RV SF, trivial MR.  Patient was subsequently lost to follow-up.   Patient was admitted in September with chest pain with atypical and typical features.  High-sensitivity troponin was elevated with a flat trend.  Lexiscan  showed no ischemia.  Echo showed EF of 60 to 65% with no wall motion abnormality.  Medication management was continued.    She was last seen in clinic 08/04/2023.  She continues to suffer from chest pain with typical and atypical features.  Discussed with previous Lexiscan  results but with concerning symptoms she was scheduled for coronary CTA and advised to follow-up her test was completed to take aspirin  81 mg daily.  Coronary CTA completed with a coronary calcium  score of 0.  With mild ascending aorta dilatation of 40 mm in diameter.  Continue to monitor with surveillance studies.  She has complaints of fast heart rates and was placed on a ZIO XT monitor.  Final result monitor history had resulted.  She returns to clinic today with continued complaints of chest pressure and shortness of breath with radiation to the left arm with palpitations.  She has also noted elevations in her blood pressure even after being started on previous  lisinopril  HCTZ.  She states that she wakes up with chest discomfort and pounding heart rate.  She was recently started on Prozac  from her PCP and advised that she has suffering from anxiety and stress.  States that she has been compliant with her current medication regimen with any adverse side effects.  States that she was also just recently diagnosed with Lyme's disease and just recently finished a round of antibiotics with doxycycline .  ROS: 10 point review of systems has been reviewed and considered negative except ones been listed in the HPI  Studies Reviewed EKG Interpretation Date/Time:  Friday October 07 2023 15:18:54 EDT Ventricular Rate:  91 PR Interval:  142 QRS Duration:  78 QT Interval:  386 QTC Calculation: 474 R Axis:   -16  Text Interpretation: Normal sinus rhythm Minimal voltage criteria for LVH, may be normal variant ( R in aVL ) When compared with ECG of 04-Aug-2023 14:23, No significant change was found Confirmed by Ronald Cockayne ([id number]) on 10/07/2023 3:19:53 PM    Preliminary result ZIO XT monitor 09/02/2023 Patient had a min HR of 47 bpm, max HR of 197 bpm, and avg HR of 74 bpm. Predominant underlying rhythm was Sinus Rhythm. 10 Supraventricular Tachycardia runs occurred, the run with the fastest interval lasting 6 beats with a max rate of 197 bpm, the longest lasting 14 beats with an avg rate of 141 bpm. Isolated SVEs were rare (<1.0%), SVE Couplets were rare (<1.0%), and no SVE Triplets were present. Isolated VEs were rare (<1.0%), and no VE  Couplets or VE Triplets were present.   cCTA 08/25/2023 IMPRESSION: 1. Coronary calcium  score of 0.   2. Normal coronary origin with right dominance.   3. No evidence of CAD.   4. CAD-RADS 0. Consider non-atherosclerotic causes of chest pain.   5. Mild ascending aorta dilation, 40 mm in diameter.  Myoview  lexiscan  01/2023 Narrative & Impression      The study is normal. There are no perfusion defects. The study is low risk.    No ST deviation was noted.   LV perfusion is normal.   Left ventricular function is normal. Nuclear stress EF: 74%. The left ventricular ejection fraction is hyperdynamic (>65%). End diastolic cavity size is normal.    Echo 01/2023 1. Left ventricular ejection fraction, by estimation, is 55 to 60%. The  left ventricle has normal function. The left ventricle has no regional  wall motion abnormalities. Left ventricular diastolic parameters were  normal. The average left ventricular  global longitudinal strain is -18.1 %. The global longitudinal strain is  normal.   2. Right ventricular systolic function is normal. The right ventricular  size is normal. There is normal pulmonary artery systolic pressure.   3. The mitral valve is normal in structure. Trivial mitral valve  regurgitation. No evidence of mitral stenosis.   4. The tricuspid valve is abnormal.   5. The aortic valve is tricuspid. Aortic valve regurgitation is not  visualized. No aortic stenosis is present.   6. The inferior vena cava is normal in size with greater than 50%  respiratory variability, suggesting right atrial pressure of 3 mmHg.   Risk Assessment/Calculations         Physical Exam VS:  BP 120/80 (BP Location: Left Arm, Patient Position: Sitting, Cuff Size: Large)   Pulse 91   Ht 5\' 5"  (1.651 m)   Wt 224 lb 8 oz (101.8 kg)   SpO2 98%   BMI 37.36 kg/m    Wt Readings from Last 3 Encounters:  10/07/23 224 lb 8 oz (101.8 kg)  09/09/23 223 lb (101.2 kg)  09/07/23 220 lb (99.8 kg)    GEN: Well nourished, well developed in no acute distress NECK: No JVD; No carotid bruits CARDIAC: RRR, no murmurs, rubs, gallops RESPIRATORY:  Clear to auscultation without rales, wheezing or rhonchi  ABDOMEN: Soft, non-tender, non-distended EXTREMITIES:  No edema; No deformity   ASSESSMENT AND PLAN Atypical chest pain with recent coronary CTA revealing a coronary calcium  score of 0.  No evidence of coronary artery disease.  EKG  today reveals sinus rhythm rate 91 with left axis deviation, LVH, no ischemic changes.  No further ischemic testing warranted at this time.  Palpitations that have woken him from sleep in the night.  Preliminary results been reviewed shows an average heart rate of 74 bpm with prominent underlying rhythm sinus rhythm with over 10 supraventricular tachycardia runs with the fastest lasting 6 beats for maximum 197 bpm the longest lasting 14 beats with average 141 bpm with isolated SVE's.  There were no pauses and no further arrhythmia.  Due to the symptomatic nature she has been started on low-dose Toprol -XL 12.5 mg daily.  Recent labs revealed no electrolyte matter maladies, normal thyroid , and no anemia or infection with elevated WBCs.  Primary hypertension with a blood pressure of 120/80.  She is continued on lisinopril /HCTZ 20/25 mg daily.  She has been started on Toprol -XL 12.5 mg daily.  Encouraged to continue to monitor pressure 1 to 2 hours postmedication administration as  well.  Mild ascending aortic dilatation of 40 mm as an incidental finding on coronary CTA.  Discussed the importance of management of blood pressure.  Will continue to follow-up with annual surveillance studies.  Obesity with a BMI of 37.36.  Weight loss would be beneficial.  Continue to increase activity and make dietary changes.       Dispo: Patient return to clinic to see MD/APP in 6 weeks or sooner if needed for evaluation of current symptoms after recent medication changes.  Signed, Daksha Koone, NP  Discharge

## 2023-10-10 ENCOUNTER — Other Ambulatory Visit: Payer: Self-pay | Admitting: Cardiology

## 2023-10-10 NOTE — Addendum Note (Signed)
 Addended by: Deontray Hunnicutt D on: 10/10/2023 09:51 AM   Modules accepted: Orders

## 2023-10-10 NOTE — Telephone Encounter (Signed)
Thanks fine

## 2023-10-11 LAB — COMPREHENSIVE METABOLIC PANEL WITH GFR
ALT: 17 IU/L (ref 0–32)
AST: 20 IU/L (ref 0–40)
Albumin: 4.2 g/dL (ref 3.8–4.9)
Alkaline Phosphatase: 86 IU/L (ref 44–121)
BUN/Creatinine Ratio: 17 (ref 9–23)
BUN: 14 mg/dL (ref 6–24)
Bilirubin Total: 1.1 mg/dL (ref 0.0–1.2)
CO2: 22 mmol/L (ref 20–29)
Calcium: 9.2 mg/dL (ref 8.7–10.2)
Chloride: 105 mmol/L (ref 96–106)
Creatinine, Ser: 0.81 mg/dL (ref 0.57–1.00)
Globulin, Total: 2.3 g/dL (ref 1.5–4.5)
Glucose: 170 mg/dL — ABNORMAL HIGH (ref 70–99)
Potassium: 3.8 mmol/L (ref 3.5–5.2)
Sodium: 141 mmol/L (ref 134–144)
Total Protein: 6.5 g/dL (ref 6.0–8.5)
eGFR: 87 mL/min/{1.73_m2} (ref >59)

## 2023-10-11 LAB — D-DIMER, QUANTITATIVE: D-DIMER: 0.44 mg{FEU}/L (ref 0.00–0.49)

## 2023-10-12 ENCOUNTER — Ambulatory Visit: Payer: Self-pay | Admitting: Cardiology

## 2023-10-12 ENCOUNTER — Other Ambulatory Visit: Payer: Self-pay | Admitting: Nurse Practitioner

## 2023-10-12 DIAGNOSIS — R5383 Other fatigue: Secondary | ICD-10-CM

## 2023-10-12 DIAGNOSIS — R0602 Shortness of breath: Secondary | ICD-10-CM

## 2023-10-12 DIAGNOSIS — R002 Palpitations: Secondary | ICD-10-CM

## 2023-10-12 DIAGNOSIS — I471 Supraventricular tachycardia, unspecified: Secondary | ICD-10-CM

## 2023-10-12 DIAGNOSIS — I10 Essential (primary) hypertension: Secondary | ICD-10-CM

## 2023-10-12 NOTE — Progress Notes (Signed)
 Negative D-dimer, no further testing indicated to rule out a pulmonary embolism.

## 2023-10-22 LAB — METANEPHRINES, URINE, 24 HOUR
Metaneph Total, Ur: 127 ug/L
Metanephrines, 24H Ur: 165 ug/(24.h) (ref 36–209)
Normetanephrine, 24H Ur: 181 ug/(24.h) (ref 131–612)
Normetanephrine, Ur: 139 ug/L

## 2023-10-22 LAB — CATECHOLAMINES, FRACTIONATED, URINE, 24 HOUR
Dopamine , 24H Ur: 202 ug/(24.h) (ref 0–510)
Dopamine, Rand Ur: 155 ug/L
Epinephrine, 24H Ur: <4 ug/(24.h) (ref 0–20)
Epinephrine, Rand Ur: <3 ug/L
Norepinephrine, 24H Ur: <20 ug/(24.h) (ref 0–135)
Norepinephrine, Rand Ur: <15 ug/L

## 2023-10-24 ENCOUNTER — Ambulatory Visit: Payer: Self-pay | Admitting: Nurse Practitioner

## 2023-10-25 ENCOUNTER — Ambulatory Visit: Admitting: Nurse Practitioner

## 2023-10-26 ENCOUNTER — Ambulatory Visit: Payer: Self-pay | Admitting: Cardiology

## 2023-10-26 NOTE — Progress Notes (Signed)
 No significant findings noted on the over read CT of the chest from prior coronary CTA that was completed.

## 2023-10-31 ENCOUNTER — Ambulatory Visit: Admitting: Adult Health

## 2023-11-02 ENCOUNTER — Other Ambulatory Visit: Payer: Self-pay | Admitting: Nurse Practitioner

## 2023-11-10 ENCOUNTER — Encounter: Payer: Self-pay | Admitting: Nurse Practitioner

## 2023-11-10 ENCOUNTER — Other Ambulatory Visit: Payer: Self-pay | Admitting: Adult Health

## 2023-11-10 MED ORDER — ESTRADIOL 0.1 MG/GM VA CREA
TOPICAL_CREAM | VAGINAL | 1 refills | Status: DC
Start: 2023-11-10 — End: 2023-11-17

## 2023-11-10 NOTE — Progress Notes (Signed)
 Rx estrace  vaginal cream for vaginal dryness

## 2023-11-11 ENCOUNTER — Encounter: Payer: Self-pay | Admitting: *Deleted

## 2023-11-16 ENCOUNTER — Ambulatory Visit: Attending: Cardiology | Admitting: Cardiology

## 2023-11-16 ENCOUNTER — Encounter: Payer: Self-pay | Admitting: *Deleted

## 2023-11-16 ENCOUNTER — Telehealth: Payer: Self-pay | Admitting: *Deleted

## 2023-11-16 ENCOUNTER — Encounter: Payer: Self-pay | Admitting: Cardiology

## 2023-11-16 VITALS — BP 138/82 | HR 81 | Ht 65.0 in | Wt 234.4 lb

## 2023-11-16 DIAGNOSIS — E66812 Obesity, class 2: Secondary | ICD-10-CM | POA: Diagnosis not present

## 2023-11-16 DIAGNOSIS — Z6835 Body mass index (BMI) 35.0-35.9, adult: Secondary | ICD-10-CM | POA: Insufficient documentation

## 2023-11-16 DIAGNOSIS — Z79899 Other long term (current) drug therapy: Secondary | ICD-10-CM | POA: Insufficient documentation

## 2023-11-16 DIAGNOSIS — R002 Palpitations: Secondary | ICD-10-CM | POA: Insufficient documentation

## 2023-11-16 DIAGNOSIS — I10 Essential (primary) hypertension: Secondary | ICD-10-CM | POA: Insufficient documentation

## 2023-11-16 DIAGNOSIS — I77819 Aortic ectasia, unspecified site: Secondary | ICD-10-CM | POA: Insufficient documentation

## 2023-11-16 MED ORDER — LISINOPRIL 10 MG PO TABS: 10.0000 mg | ORAL_TABLET | Freq: Every day | ORAL | 3 refills | Status: DC

## 2023-11-16 NOTE — Progress Notes (Signed)
 Cardiology Office Note   Date:  11/16/2023  ID:  Theresa [name], DOB 05-29-1970, MRN [id number] PCP: Alphonsa Glendia LABOR, MD  [facility] Providers Cardiologist:  Alvan Carrier, MD Cardiology APP:  Gerard Frederick, NP     History of Present Illness Theresa [name] is a 53 y.o. female with a past medical history of chronic chest pain, fibromyalgia, OCD, depression, anxiety, seizures, GERD, HTN, migraine headaches, obesity, who present today for follow-up.   Patient has a long history of atypical chest pain, possibly from anxiety or stress or GERD.  Treadmill test was ordered in 2020, but never performed.  Heart monitor in 2021 for palpitations showed normal sinus rhythm, rare PACs and PVCs, brief atrial runs.  No sustained arrhythmias.  Echo showed EF 65 to 70%, normal RV SF, trivial MR.  Patient was subsequently lost to follow-up.   Patient was admitted in September with chest pain with atypical and typical features.  High-sensitivity troponin was elevated with a flat trend.  Lexiscan  showed no ischemia.  Echo showed EF of 60 to 65% with no wall motion abnormality.  Medication management was continued.   Coronary CTA completed with a coronary calcium  score of 0. There was mild ascending aorta dilatation of 40 mm in diameter. Then with a concern fr palpitations she was placed on Zio XT monitor. Monitor showed an average heart rate of 74 bpm. 10 episodes of SVT. Rare ectopy. No atrial fib/flutter.    She was last seen in clinic 10/07/2023. She continued to have complaints of chest pressure and shortness of breath. Blood pressure had been elevated. She was recently started on new mediations by her PCP for anxiety and was treated for Lyme's disease and had just finished up antibiotic therapy. She was started on low dose Toprol  XL to assist with suppressing palpitations.   She returns to clinic today with continued complaints of fatigue, shortness of breath, palpitations, weight gain, and chest  pressure.  Previously prescribed a low-dose of beta-blocker to help with the palpitations which she did not start due to fatigue.  She stated that she talked to the pharmacist and the pharmacist advised her that it would likely worsen her symptoms so she did not even have the medication filled.  She states that she has been out of her Symbicort .  Has been taking antianxiety, and hypertension medications.  She has noted an increase in diastolic blood pressures.  Denies any recent hospitalizations or visits to the emergency department.  ROS: 10 point review of systems has been reviewed and considered negative with the exception of what has been listed in the HPI  Studies Reviewed EKG Interpretation Date/Time:  Wednesday November 16 2023 09:34:14 EDT Ventricular Rate:  81 PR Interval:  154 QRS Duration:  84 QT Interval:  392 QTC Calculation: 455 R Axis:   -18  Text Interpretation: Normal sinus rhythm Minimal voltage criteria for LVH, may be normal variant ( R in aVL ) When compared with ECG of 07-Oct-2023 15:18, No significant change since last tracing Confirmed by Gerard Frederick ([id number]) on 11/16/2023 9:36:53 AM    ZIO XT monitor 09/02/2023 Patient had a min HR of 47 bpm, max HR of 197 bpm, and avg HR of 74 bpm. Predominant underlying rhythm was Sinus Rhythm. 10 Supraventricular Tachycardia runs occurred, the run with the fastest interval lasting 6 beats with a max rate of 197 bpm, the longest lasting 14 beats with an avg rate of 141 bpm. Isolated SVEs were rare (<1.0%), SVE  Couplets were rare (<1.0%), and no SVE Triplets were present. Isolated VEs were rare (<1.0%), and no VE Couplets or VE Triplets were present.    cCTA 08/25/2023 IMPRESSION: 1. Coronary calcium  score of 0.   2. Normal coronary origin with right dominance.   3. No evidence of CAD.   4. CAD-RADS 0. Consider non-atherosclerotic causes of chest pain.   5. Mild ascending aorta dilation, 40 mm in diameter.   Myoview  lexiscan   01/2023 Narrative & Impression      The study is normal. There are no perfusion defects. The study is low risk.   No ST deviation was noted.   LV perfusion is normal.   Left ventricular function is normal. Nuclear stress EF: 74%. The left ventricular ejection fraction is hyperdynamic (>65%). End diastolic cavity size is normal.    Echo 01/2023 1. Left ventricular ejection fraction, by estimation, is 55 to 60%. The  left ventricle has normal function. The left ventricle has no regional  wall motion abnormalities. Left ventricular diastolic parameters were  normal. The average left ventricular  global longitudinal strain is -18.1 %. The global longitudinal strain is  normal.   2. Right ventricular systolic function is normal. The right ventricular  size is normal. There is normal pulmonary artery systolic pressure.   3. The mitral valve is normal in structure. Trivial mitral valve  regurgitation. No evidence of mitral stenosis.   4. The tricuspid valve is abnormal.   5. The aortic valve is tricuspid. Aortic valve regurgitation is not  visualized. No aortic stenosis is present.   6. The inferior vena cava is normal in size with greater than 50%  respiratory variability, suggesting right atrial pressure of 3 mmHg.    Risk Assessment/Calculations           Physical Exam VS:  BP 138/82 (BP Location: Left Arm, Patient Position: Sitting, Cuff Size: Normal)   Pulse 81   Ht 5' 5 (1.651 m)   Wt 234 lb 6.4 oz (106.3 kg)   SpO2 97%   BMI 39.01 kg/m        Wt Readings from Last 3 Encounters:  11/16/23 234 lb 6.4 oz (106.3 kg)  10/07/23 224 lb 8 oz (101.8 kg)  09/09/23 223 lb (101.2 kg)    GEN: Well nourished, well developed in no acute distress NECK: No JVD; No carotid bruits CARDIAC: RRR, no murmurs, rubs, gallops RESPIRATORY:  Clear to auscultation without rales, wheezing or rhonchi  ABDOMEN: Soft, non-tender, non-distended EXTREMITIES:  No edema; No deformity   ASSESSMENT AND  PLAN Palpitations that occasionally continue to wake her from sleep.  She previously worn a ZIO monitor which revealed a prominent underlying rhythm was sinus with 10 episodes of supraventricular tachycardia up to a max 197 bpm.  She was previously prescribed Toprol -XL 12.5 mg daily but due to concerns for side effects she did not pick the medication up or take them.  EKG today revealed sinus rhythm with a rate of 81 with no acute changes.  Discussed other beta-blocker therapy such as Nebivolol she declines beta-blocker therapy at this time.  Labs have been stable without concern for thyroid  disorder, there was no anemia noted on CBC and D-dimer was negative.  Mild ascending aorta dilatation measuring 40 mm as an incidental finding on coronary CTA.  Will continue with annual surveillance studies.  Obesity with a BMI of 39.01.  Weight loss would be beneficial.  Unfortunately BMI continues to rise.  She is considering hormone replacement  therapy.  Primary hypertension with a blood pressure today 140/88 with recheck of 138/82.  She has been running blood pressures at home 1 to 2 hours postmedication administration of 130s 140s systolic and 80 to overall 100 diastolic.  She has been continued on lisinopril  20/25 mg daily with an additional 10 mg of lisinopril  sent in for a total of 30/25 mg daily.  She has been scheduled for an updated BMP in 2 weeks as well.  She has been encouraged to continue to monitor her pressures 1 to 2 hours postmedication administration.       Dispo: Patient to return to clinic to see MD/APP in 8 weeks or sooner if needed for further evaluation.  She is also been encouraged to upload blood pressure readings to her MyChart and if additional uptitration of lisinopril  needs to be made can be done at that time.  Signed, Darcelle Herrada, NP

## 2023-11-16 NOTE — Telephone Encounter (Signed)
 Pt's insurance has denied covering Estradiol  cream. Pt needs to try and fail on 2 of the following preferred drugs: Estring  Vaginal Ring, Premarin  Vaginal Cream, or Vagifem  Vaginal Tablet(brand name). Please advise. Thanks! JSY

## 2023-11-16 NOTE — Patient Instructions (Signed)
 Medication Instructions:  Your physician recommends the following medication changes.  STOP TAKING: Metoprolol   START TAKING: Lisinopril  10 mg daily  *If you need a refill on your cardiac medications before your next appointment, please call your pharmacy*  Lab Work: Your provider would like for you to return in 2 weeks to have the following labs drawn: BMP.   Please go to [facility] [address] ([facility] [address], [place] [address] You do not need an appointment.  They are open from 8 am- 4:30 pm.  Lunch from 1:00 pm- 2:00 pm You DO NOT need to be fasting.   You may also go to one of the following [facility]:  [address] [place], [place] [address] Phone: [phone] Lab hours: Mon-Fri 8 am- 5 pm    Lunch 12 pm- 1 pm  [address] [place],  [place]  [address]  [place]  Phone: [phone] Lab hours: 7 am- 4 pm Lunch 12 pm-1 pm   [address] [place],  [place]  [address]  [place]  Phone: [phone] Lab hours: Mon-Fri 8 am- 5 pm    Lunch 12 pm- 1 pm  If you have labs (blood work) drawn today and your tests are completely normal, you will receive your results only by: MyChart Message (if you have MyChart) OR A paper copy in the mail If you have any lab test that is abnormal or we need to change your treatment, we will call you to review the results.  Testing/Procedures: No test ordered today   Follow-Up: At [facility], you and your health needs are our priority.  As part of our continuing mission to provide you with exceptional heart care, our providers are all part of one team.  This team includes your primary Cardiologist (physician) and Advanced Practice Providers or APPs (Physician Assistants and Nurse Practitioners) who all work together to provide you with the care you need, when you need it.  Your next appointment:   2 month(s)  Provider:   Tylene Lunch, NP

## 2023-11-17 ENCOUNTER — Encounter: Payer: Self-pay | Admitting: *Deleted

## 2023-11-17 MED ORDER — PREMARIN 0.625 MG/GM VA CREA: TOPICAL_CREAM | VAGINAL | 1 refills | Status: DC

## 2023-11-17 NOTE — Telephone Encounter (Signed)
 Will rx premarin  vaginal cream, estrace  vaginal cream not covered.

## 2023-11-18 ENCOUNTER — Ambulatory Visit: Admitting: Cardiology

## 2023-11-18 ENCOUNTER — Encounter: Payer: Self-pay | Admitting: Nurse Practitioner

## 2023-11-22 ENCOUNTER — Ambulatory Visit: Payer: Self-pay | Admitting: Medical

## 2023-11-22 DIAGNOSIS — R002 Palpitations: Secondary | ICD-10-CM

## 2023-12-23 ENCOUNTER — Encounter: Payer: Self-pay | Admitting: Radiology

## 2023-12-26 ENCOUNTER — Encounter: Payer: Self-pay | Admitting: Adult Health

## 2023-12-26 ENCOUNTER — Ambulatory Visit: Admitting: Adult Health

## 2023-12-26 VITALS — BP 144/93 | HR 88 | Ht 65.0 in | Wt 233.0 lb

## 2023-12-26 DIAGNOSIS — N898 Other specified noninflammatory disorders of vagina: Secondary | ICD-10-CM

## 2023-12-26 DIAGNOSIS — K5900 Constipation, unspecified: Secondary | ICD-10-CM

## 2023-12-26 DIAGNOSIS — I10 Essential (primary) hypertension: Secondary | ICD-10-CM | POA: Diagnosis not present

## 2023-12-26 DIAGNOSIS — Z1231 Encounter for screening mammogram for malignant neoplasm of breast: Secondary | ICD-10-CM | POA: Insufficient documentation

## 2023-12-26 DIAGNOSIS — Z1211 Encounter for screening for malignant neoplasm of colon: Secondary | ICD-10-CM

## 2023-12-26 DIAGNOSIS — N816 Rectocele: Secondary | ICD-10-CM

## 2023-12-26 DIAGNOSIS — Z01419 Encounter for gynecological examination (general) (routine) without abnormal findings: Secondary | ICD-10-CM | POA: Diagnosis not present

## 2023-12-26 DIAGNOSIS — Z90710 Acquired absence of both cervix and uterus: Secondary | ICD-10-CM | POA: Diagnosis not present

## 2023-12-26 LAB — HEMOCCULT GUIAC POC 1CARD (OFFICE): Fecal Occult Blood, POC: NEGATIVE

## 2023-12-26 MED ORDER — ESTRADIOL 0.1 MG/GM VA CREA
TOPICAL_CREAM | VAGINAL | 1 refills | Status: DC
Start: 2023-12-26 — End: 2024-03-26

## 2023-12-26 MED ORDER — SENNOSIDES-DOCUSATE SODIUM 8.6-50 MG PO TABS: 1.0000 | ORAL_TABLET | Freq: Every evening | ORAL | Status: DC | PRN

## 2023-12-26 NOTE — Progress Notes (Signed)
 Patient ID: NOLLIE SHIFLETT, female   DOB: 04/16/1971, 53 y.o.   MRN: [id number] History of Present Illness: Kia is a 53 year old white female, divorced, sp hysterectomy in for a well woman gyn exam. She lost her job this summer and was seen in ER for chest pain and has seen cardiologist, has aortic aneurysm.  PCP is Dr Alphonsa    Current Medications, Allergies, Past Medical History, Past Surgical History, Family History and Social History were reviewed in [employer] electronic medical record.     Review of Systems: Patient denies any headaches, hearing loss, fatigue, blurred vision, shortness of breath, chest pain, abdominal pain, problems with  urination, or intercourse.(Not currently active).  No joint pain or mood swings.  +constipation Has vaginal dryness too  Physical Exam:BP (!) 144/93 (BP Location: Right Arm, Patient Position: Sitting, Cuff Size: Large)   Pulse 88   Ht 5' 5 (1.651 m)   Wt 233 lb (105.7 kg)   BMI 38.77 kg/m   General:  Well developed, well nourished, no acute distress Skin:  Warm and dry Neck:  Midline trachea, normal thyroid , good ROM, no lymphadenopathy Lungs; Clear to auscultation bilaterally Breast:  No dominant palpable mass, retraction, or nipple discharge Cardiovascular: Regular rate and rhythm Abdomen:  Soft, non tender, no hepatosplenomegaly Pelvic:  External genitalia is normal in appearance, no lesions.  The vagina is normal in appearance. Urethra has no lesions or masses. The cervix and uterus are absent.  No adnexal masses or tenderness noted.Bladder is non tender, no masses felt. Rectal: Good sphincter tone, no polyps, or hemorrhoids felt.  Hemoccult negative.+rectocele Extremities/musculoskeletal:  No swelling or varicosities noted, no clubbing or cyanosis Psych:  No mood changes, alert and cooperative,seems happy AA is 2 Fall risk I slow    12/26/2023   10:04 AM 09/09/2023    3:25 PM 01/28/2023    1:41 PM  Depression screen PHQ 2/9   Decreased Interest 0 0 2  Down, Depressed, Hopeless 0 0 3  PHQ - 2 Score 0 0 5  Altered sleeping 3 1 3   Tired, decreased energy 3 0 3  Change in appetite 0 0 0  Feeling bad or failure about yourself  0 0 3  Trouble concentrating 3 3 3   Moving slowly or fidgety/restless 0 0 0  Suicidal thoughts 0 0 0  PHQ-9 Score 9 4 17   Difficult doing work/chores  Somewhat difficult Very difficult       12/26/2023   10:05 AM 01/28/2023    1:42 PM 11/12/2022    4:23 PM 07/16/2022    4:24 PM  GAD 7 : Generalized Anxiety Score  Nervous, Anxious, on Edge 0 3 1 0  Control/stop worrying 0 3 2 0  Worry too much - different things 0 3 2 0  Trouble relaxing 0 3 2 1   Restless 0 3 0 0  Easily annoyed or irritable 2 3 2 1   Afraid - awful might happen 0 0 0 1  Total GAD 7 Score 2 18 9 3   Anxiety Difficulty  Very difficult Somewhat difficult Not difficult at all      Upstream - 12/26/23 1002       Pregnancy Intention Screening   Does the patient want to become pregnant in the next year? N/A    Does the patient's partner want to become pregnant in the next year? N/A    Would the patient like to discuss contraceptive options today? N/A  Contraception Wrap Up   Current Method Abstinence;Female Sterilization   hyst   End Method Abstinence;Female Sterilization   hyst   Contraception Counseling Provided No         Examination chaperoned by Clarita Salt LPN  Impression and Plan: 1. Encounter for well woman exam with routine gynecological exam (Primary) Physical in 1 year Labs with PCP Colonoscopy per GI  2. Encounter for screening fecal occult blood testing Hemoccult was negative  - POCT occult blood stool  3. Screening mammogram for breast cancer Call for mammogram appt, last one was 12/10/22 and negative  - MM 3D SCREENING MAMMOGRAM BILATERAL BREAST; Future  4. Essential hypertension, benign Take BP meds and follow up with PCP or cardiologist   5. Rectocele  6. Constipation,  unspecified constipation type Try senokot S 1 daily, if not working try 2 Drink water  and eat grapes and apples to add fiber   7. Vaginal dryness Will rx estrace  vaginal cream  Meds ordered this encounter  Medications   estradiol  (ESTRACE  VAGINAL) 0.1 MG/GM vaginal cream    Sig: Use 1 gm in vagina nightly for 2 weeks then go to 2 x weekly    Dispense:  42.5 g    Refill:  1    Supervising Provider:   JAYNE MINDER H [[id number]]   senna-docusate (SENOKOT S) 8.6-50 MG tablet    Sig: Take 1 tablet by mouth at bedtime as needed for mild constipation.    Supervising Provider:   JAYNE MINDER H [[id number]]     8. S/P hysterectomy

## 2024-01-17 ENCOUNTER — Ambulatory Visit: Admitting: Cardiology

## 2024-01-20 ENCOUNTER — Encounter: Payer: Self-pay | Admitting: Nurse Practitioner

## 2024-01-20 NOTE — Telephone Encounter (Signed)
 See request in patient's chart

## 2024-01-23 ENCOUNTER — Encounter: Payer: Self-pay | Admitting: Nurse Practitioner

## 2024-01-24 MED ORDER — AMOXICILLIN-POT CLAVULANATE 875-125 MG PO TABS: 1.0000 | ORAL_TABLET | Freq: Two times a day (BID) | ORAL | 0 refills | Status: DC

## 2024-01-24 NOTE — Telephone Encounter (Signed)
 Nurses I am covering for Theresa [name]  Symptoms seem consistent with diverticulitis In some situations this will gradually get better on its own and may not require antibiotics but given her history it would be reasonable to go ahead and treat  Please send in Augmentin  875 mg 1 twice daily for 7 days If she is not improving over the course of the next 48 to 72 hours on medication and is very necessary for her to be checked either here at the office or urgent care  If her symptoms get worse such as severe pain fevers repetitive vomiting etc. it would be necessary to go to the ER may need CT scan  If does not clear up with this medication it is also necessary to be seen-if she is having ongoing troubles it would be necessary to be seen  Please make sure the medication is sent in and the above messages relayed to [employer]. Theresa [name]

## 2024-02-21 ENCOUNTER — Other Ambulatory Visit: Payer: Self-pay | Admitting: Nurse Practitioner

## 2024-03-05 ENCOUNTER — Encounter: Payer: Self-pay | Admitting: Family Medicine

## 2024-03-05 ENCOUNTER — Encounter: Payer: Self-pay | Admitting: Radiology

## 2024-03-05 ENCOUNTER — Ambulatory Visit: Admitting: Family Medicine

## 2024-03-05 ENCOUNTER — Ambulatory Visit: Payer: Self-pay

## 2024-03-05 VITALS — BP 137/93 | HR 82 | Temp 98.1°F | Ht 65.0 in | Wt 240.0 lb

## 2024-03-05 DIAGNOSIS — J019 Acute sinusitis, unspecified: Secondary | ICD-10-CM | POA: Diagnosis not present

## 2024-03-05 MED ORDER — CEPHALEXIN 500 MG PO CAPS: 500.0000 mg | ORAL_CAPSULE | Freq: Three times a day (TID) | ORAL | 0 refills | Status: DC

## 2024-03-05 MED ORDER — BUDESONIDE-FORMOTEROL FUMARATE 80-4.5 MCG/ACT IN AERO: 2.0000 | INHALATION_SPRAY | Freq: Two times a day (BID) | RESPIRATORY_TRACT | 2 refills | Status: AC

## 2024-03-05 NOTE — Telephone Encounter (Signed)
 FYI Only or Action Required?: FYI only for provider: appointment scheduled on 03/05/24.  Patient was last seen in primary care on 09/09/2023 by Mauro Elveria BROCKS, NP.  Called Nurse Triage reporting Dizziness and Sinusitis.  Symptoms began several days ago.  Interventions attempted: OTC medications: Dayquil.  Symptoms are: deep cough, sinus congestion, sneezing, sore throat, hoarse voice gradually worsening.  Triage Disposition: See Physician Within 24 Hours  Patient/caregiver understands and will follow disposition?: Yes            Copied from CRM [id number]. Topic: Clinical - Red Word Triage >> Mar 05, 2024  8:33 AM Victoria B wrote: [facility] that prompted transfer to Nurse Triage: patient has dizziness Reason for Disposition  [1] MODERATE dizziness (e.g., vertigo; feels very unsteady, interferes with normal activities) AND [2] has NOT been evaluated by doctor (or NP/PA) for this  Answer Assessment - Initial Assessment Questions 1. DESCRIPTION: Describe your dizziness.     Feels like standing on a boat and can't keep balance.  2. VERTIGO: Do you feel like either you or the room is spinning or tilting?      Yes.  3. LIGHTHEADED: Do you feel lightheaded? (e.g., somewhat faint, woozy, weak upon standing)     Yes.  4. SEVERITY: How bad is it?  Can you walk?     Mild. Patient able to stand and walk without assistance. She states she has bumped into a few things but walking without falling or passing out.  5. ONSET:  When did the dizziness begin?     Thursday.  6. AGGRAVATING FACTORS: Does anything make it worse? (e.g., standing, change in head position)     Bending over.  7. CAUSE: What do you think is causing the dizziness?     Left ear pimple with some inflammation.  8. RECURRENT SYMPTOM: Have you had dizziness before? If Yes, ask: When was the last time? What happened that time?     Yes, about 5 years ago.  9. OTHER SYMPTOMS: Do you have any  other symptoms? (e.g., earache, headache, numbness, tinnitus, vomiting, weakness)     Friday started with nasal congestion/stuffiness, sore throat, feels like it is going into chest, deep dry cough, sneezing, hoarse voice. Unsure if she has had a fever. Denies numbness, weakness, changes in speech or vision, nausea, vomiting, diarrhea.  10. PREGNANCY: Is there any chance you are pregnant? When was your last menstrual period?       N/A.  Protocols used: Dizziness - Vertigo-A-AH

## 2024-03-05 NOTE — Progress Notes (Signed)
   Subjective:    Patient ID: TAURUS WILLIS, female    DOB: 12-29-1970, 53 y.o.   MRN: [id number]  HPI  Started with sore throat Friday Body aches, chills , deep cough, scar tissue in lungs from covid, runny nose Patient started over the weekend with headache not feeling good cough congestion sinus pressure now with some bodyaches fatigue tiredness just not feeling well denies wheezing or difficulty breathing.  Patient with previous COVID few years ago which was severe she states she did do a COVID test that was negative Review of Systems     Objective:   Physical Exam Mild maxillary sinus tenderness eardrums normal throat normal lungs clear heart regular   Body aches more than likely related to underlying viral illness will take a few days for it to resolve stay home from work next couple days    Assessment & Plan:  Sinusitis Patient is okay with utilizing Keflex  she does not want to utilize Augmentin  she will follow-up if progressive troubles or worse warning signs were discussed in detail work excuse given Monday Tuesday Wednesday

## 2024-03-12 ENCOUNTER — Encounter: Payer: Self-pay | Admitting: Nurse Practitioner

## 2024-03-13 ENCOUNTER — Other Ambulatory Visit: Payer: Self-pay | Admitting: Nurse Practitioner

## 2024-03-13 DIAGNOSIS — I10 Essential (primary) hypertension: Secondary | ICD-10-CM

## 2024-03-13 DIAGNOSIS — R5383 Other fatigue: Secondary | ICD-10-CM

## 2024-03-13 DIAGNOSIS — R7401 Elevation of levels of liver transaminase levels: Secondary | ICD-10-CM

## 2024-03-13 DIAGNOSIS — E876 Hypokalemia: Secondary | ICD-10-CM

## 2024-03-13 DIAGNOSIS — R7301 Impaired fasting glucose: Secondary | ICD-10-CM

## 2024-03-26 ENCOUNTER — Ambulatory Visit (INDEPENDENT_AMBULATORY_CARE_PROVIDER_SITE_OTHER): Payer: Self-pay | Admitting: Nurse Practitioner

## 2024-03-26 ENCOUNTER — Encounter: Payer: Self-pay | Admitting: Nurse Practitioner

## 2024-03-26 VITALS — BP 146/88 | HR 71 | Temp 97.6°F | Ht 65.0 in | Wt 239.0 lb

## 2024-03-26 DIAGNOSIS — K5909 Other constipation: Secondary | ICD-10-CM | POA: Diagnosis not present

## 2024-03-26 DIAGNOSIS — K219 Gastro-esophageal reflux disease without esophagitis: Secondary | ICD-10-CM

## 2024-03-26 DIAGNOSIS — Z6839 Body mass index (BMI) 39.0-39.9, adult: Secondary | ICD-10-CM | POA: Diagnosis not present

## 2024-03-26 DIAGNOSIS — E66812 Obesity, class 2: Secondary | ICD-10-CM | POA: Diagnosis not present

## 2024-03-26 DIAGNOSIS — I10 Essential (primary) hypertension: Secondary | ICD-10-CM

## 2024-03-26 DIAGNOSIS — F419 Anxiety disorder, unspecified: Secondary | ICD-10-CM | POA: Diagnosis not present

## 2024-03-26 DIAGNOSIS — F32A Depression, unspecified: Secondary | ICD-10-CM | POA: Diagnosis not present

## 2024-03-26 MED ORDER — LISINOPRIL-HYDROCHLOROTHIAZIDE 20-25 MG PO TABS: 1.0000 | ORAL_TABLET | Freq: Every day | ORAL | 1 refills | Status: AC

## 2024-03-26 MED ORDER — CLONAZEPAM 0.5 MG PO TABS: ORAL_TABLET | ORAL | 0 refills | Status: AC

## 2024-03-26 MED ORDER — DEXLANSOPRAZOLE 60 MG PO CPDR: 1.0000 | DELAYED_RELEASE_CAPSULE | Freq: Every day | ORAL | 1 refills | Status: AC

## 2024-03-26 NOTE — Progress Notes (Signed)
 Subjective:    Patient ID: Theresa [name], female    DOB: [birth year], 53 y.o.   MRN: [id number]  HPI Discussed the use of AI scribe software for clinical note transcription with the patient, who gave verbal consent to proceed.  History of Present Illness Theresa [name] is a 53 year old female presents for routine follow up of chronic health issues.  She has experienced significant weight fluctuations over the past few weeks. Initially, she had an increased appetite and could not feel full, but now she feels excessively full and has not had a good bowel movement for an unspecified duration. Her weight affects her energy levels and ability to exercise, and she is concerned about its impact on her heart and overall health.  She describes episodes of dizziness and feeling faint, particularly when standing for prolonged periods. About three to four weeks ago, she experienced a severe episode of dizziness and nausea while at the bank, feeling as though she might pass out. She managed the episode by eating a chicken sandwich and taking her blood pressure medication, which helped alleviate her symptoms. She has had a few similar episodes since then, often accompanied by extreme nausea. No numbness, weakness, or changes in speech during these episodes.  She has a history of GERD and has been off her Dexilant  medication for about three weeks due to insurance issues, which she believes is contributing to her nausea. She experiences nausea every other day, sometimes on consecutive days. She also reports acid reflux and occasional abdominal pain. Denies tobacco use. Rare alcohol use. Has taken NSAIDs more this past week for pain.   She has a history of plantar fasciitis and recently experienced severe foot pain after wearing heels, which radiated to her knee and hip. She used a TENS unit, heating pad, ice, and massage to manage the pain, which was severe enough to bring her to tears. Pain has almost  resolved. Has had increased hip and knee pain since gaining weight.   She has a history of post-COVID complications and reports fluctuating vision, which she plans to address with an upcoming eye doctor visit. She uses Symbicort  inhaler regularly due to a dry, deep cough that has persisted since a recent illness. Her oxygen  levels have been around 90%.  She is currently taking lisinopril  for blood pressure management, though she takes it irregularly, about three times a week. She also takes Klonopin  as needed for anxiety.  Gets physicals with GYN.   Review of Systems  Constitutional:  Positive for fatigue. Negative for fever.  Eyes:  Positive for visual disturbance.  Respiratory:  Positive for cough. Negative for chest tightness, shortness of breath and wheezing.        No unusual SOB or chest pain. Sees cardiology for heart issues.   Cardiovascular:  Negative for chest pain and leg swelling.  Gastrointestinal:  Positive for abdominal pain, constipation and nausea. Negative for diarrhea and vomiting.  Neurological:  Positive for dizziness. Negative for syncope, facial asymmetry, speech difficulty, weakness, numbness and headaches.      12/26/2023   10:04 AM  Depression screen PHQ 2/9  Decreased Interest 0  Down, Depressed, Hopeless 0  PHQ - 2 Score 0  Altered sleeping 3  Tired, decreased energy 3  Change in appetite 0  Feeling bad or failure about yourself  0  Trouble concentrating 3  Moving slowly or fidgety/restless 0  Suicidal thoughts 0  PHQ-9 Score 9      Data  saved with a previous flowsheet row definition      12/26/2023   10:05 AM 01/28/2023    1:42 PM 11/12/2022    4:23 PM 07/16/2022    4:24 PM  GAD 7 : Generalized Anxiety Score  Nervous, Anxious, on Edge 0 3 1 0  Control/stop worrying 0 3 2 0  Worry too much - different things 0 3 2 0  Trouble relaxing 0 3 2 1   Restless 0 3 0 0  Easily annoyed or irritable 2 3 2 1   Afraid - awful might happen 0 0 0 1  Total GAD 7  Score 2 18 9 3   Anxiety Difficulty  Very difficult Somewhat difficult Not difficult at all    Social History   Tobacco Use   Smoking status: Never   Smokeless tobacco: Never  Vaping Use   Vaping status: Never Used  Substance Use Topics   Alcohol use: Yes    Comment: occas   Drug use: No        Objective:   Physical Exam Vitals and nursing note reviewed.  Constitutional:      General: She is not in acute distress. Cardiovascular:     Rate and Rhythm: Normal rate and regular rhythm.     Heart sounds: Murmur heard.     Comments: Gr 2/6 soft early systolic murmur heard loudest at 4th ICS-LSB.  Pulmonary:     Effort: Pulmonary effort is normal.     Breath sounds: Normal breath sounds.  Abdominal:     General: There is no distension.     Palpations: Abdomen is soft.     Tenderness: There is abdominal tenderness. There is no guarding or rebound.     Comments: Minimal lower abdominal tenderness. Distinct epigastric area tenderness to palpation.   Musculoskeletal:     Right lower leg: No edema.     Left lower leg: No edema.  Skin:    General: Skin is warm and dry.  Neurological:     General: No focal deficit present.     Mental Status: She is alert and oriented to person, place, and time.     Gait: Gait normal.  Psychiatric:        Mood and Affect: Mood normal.        Behavior: Behavior normal.        Thought Content: Thought content normal.    Today's Vitals   03/26/24 0803 03/26/24 0814  BP: (!) 150/90 (!) 146/88  Pulse: 71   Temp: 97.6 F (36.4 C)   SpO2: 97%   Weight: 239 lb (108.4 kg)   Height: 5' 5 (1.651 m)    Body mass index is 39.77 kg/m.        Assessment & Plan:  1. Essential hypertension (Primary) Blood pressure elevated due to stress and weight gain. Inconsistent adherence to lisinopril  and hydrochlorothiazide . Stress and lifestyle factors contribute. - Encouraged daily adherence to lisinopril  and hydrochlorothiazide . - Advised home blood  pressure monitoring. - lisinopril -hydrochlorothiazide  (ZESTORETIC ) 20-25 MG tablet; Take 1 tablet by mouth daily.  Dispense: 90 tablet; Refill: 1  2. Chronic constipation Continue OTC regimen as directed. Call back if persists.   3. Anxiety and depression Discontinued Prozac . Uses occasional Clonazepam  for anxiety.  - clonazePAM  (KLONOPIN ) 0.5 MG tablet; TAKE 1/2 TO 1 TABLET BY MOUTH TWICE DAILY FOR EXTREME ANXIETY  Dispense: 30 tablet; Refill: 0  4. Class 2 severe obesity due to excess calories with serious comorbidity and body mass index (BMI)  of 39.0 to 39.9 in adult Weight gain affects energy, exercise, and orthopedic issues. Adipex not suitable due to side effects and hypertension. Insurance coverage for Saxenda uncertain. - Encouraged 10-pound weight loss to reduce joint pressure. - Discussed Saxenda use pending insurance. - Advised monitoring blood pressure and heart rate.  5. Gastroesophageal reflux disease without esophagitis Nausea and discomfort likely due to Dexilant  discontinuation. GERD requires consistent management. - Prescribed pantoprazole  as alternative to Dexilant . - Advised monitoring symptoms and reporting persistent nausea. - Discussed dietary modifications to avoid GERD exacerbation. - dexlansoprazole  (DEXILANT ) 60 MG capsule; Take 1 capsule (60 mg total) by mouth daily. For acid reflux  Dispense: 90 capsule; Refill: 1 Will get labs done today.    Return in about 6 months (around 09/23/2024).

## 2024-03-28 ENCOUNTER — Ambulatory Visit: Payer: Self-pay | Admitting: Nurse Practitioner

## 2024-03-28 LAB — CBC WITH DIFFERENTIAL/PLATELET
Basophils Absolute: 0 x10E3/uL (ref 0.0–0.2)
Basos: 1 %
EOS (ABSOLUTE): 0.3 x10E3/uL (ref 0.0–0.4)
Eos: 5 %
Hematocrit: 38 % (ref 34.0–46.6)
Hemoglobin: 12.6 g/dL (ref 11.1–15.9)
Immature Grans (Abs): 0 x10E3/uL (ref 0.0–0.1)
Immature Granulocytes: 0 %
Lymphocytes Absolute: 1.8 x10E3/uL (ref 0.7–3.1)
Lymphs: 31 %
MCH: 31.1 pg (ref 26.6–33.0)
MCHC: 33.2 g/dL (ref 31.5–35.7)
MCV: 94 fL (ref 79–97)
Monocytes Absolute: 0.5 x10E3/uL (ref 0.1–0.9)
Monocytes: 8 %
Neutrophils Absolute: 3.2 x10E3/uL (ref 1.4–7.0)
Neutrophils: 55 %
Platelets: 260 x10E3/uL (ref 150–450)
RBC: 4.05 x10E6/uL (ref 3.77–5.28)
RDW: 12.6 % (ref 11.7–15.4)
WBC: 5.7 x10E3/uL (ref 3.4–10.8)

## 2024-03-28 LAB — COMPREHENSIVE METABOLIC PANEL WITH GFR
ALT: 21 IU/L (ref 0–32)
AST: 19 IU/L (ref 0–40)
Albumin: 4.2 g/dL (ref 3.8–4.9)
Alkaline Phosphatase: 95 IU/L (ref 49–135)
BUN/Creatinine Ratio: 12 (ref 9–23)
BUN: 10 mg/dL (ref 6–24)
Bilirubin Total: 0.9 mg/dL (ref 0.0–1.2)
CO2: 23 mmol/L (ref 20–29)
Calcium: 9 mg/dL (ref 8.7–10.2)
Chloride: 105 mmol/L (ref 96–106)
Creatinine, Ser: 0.82 mg/dL (ref 0.57–1.00)
Globulin, Total: 2.3 g/dL (ref 1.5–4.5)
Glucose: 116 mg/dL — ABNORMAL HIGH (ref 70–99)
Potassium: 4.7 mmol/L (ref 3.5–5.2)
Sodium: 141 mmol/L (ref 134–144)
Total Protein: 6.5 g/dL (ref 6.0–8.5)
eGFR: 86 mL/min/1.73 (ref >59)

## 2024-03-28 LAB — TSH: TSH: 1.37 u[IU]/mL (ref 0.450–4.500)

## 2024-03-28 LAB — MICROALBUMIN / CREATININE URINE RATIO
Creatinine, Urine: 109.2 mg/dL
Microalb/Creat Ratio: 5 mg/g{creat} (ref 0–29)
Microalbumin, Urine: 5 ug/mL

## 2024-03-28 LAB — LIPID PANEL
Chol/HDL Ratio: 3 ratio (ref 0.0–4.4)
Cholesterol, Total: 191 mg/dL (ref 100–199)
HDL: 63 mg/dL (ref >39)
LDL Chol Calc (NIH): 114 mg/dL — ABNORMAL HIGH (ref 0–99)
Triglycerides: 74 mg/dL (ref 0–149)
VLDL Cholesterol Cal: 14 mg/dL (ref 5–40)

## 2024-03-28 LAB — HEMOGLOBIN A1C
Est. average glucose Bld gHb Est-mCnc: 120 mg/dL
Hgb A1c MFr Bld: 5.8 % — ABNORMAL HIGH (ref 4.8–5.6)

## 2024-04-19 ENCOUNTER — Encounter: Payer: Self-pay | Admitting: Nurse Practitioner

## 2024-04-20 ENCOUNTER — Other Ambulatory Visit: Payer: Self-pay | Admitting: Nurse Practitioner

## 2024-04-20 MED ORDER — ONDANSETRON 4 MG PO TBDP: 4.0000 mg | ORAL_TABLET | Freq: Three times a day (TID) | ORAL | 0 refills | Status: AC | PRN

## 2024-04-23 ENCOUNTER — Inpatient Hospital Stay (HOSPITAL_COMMUNITY)
Admission: EM | Admit: 2024-04-23 | Discharge: 2024-04-25 | Disposition: A | Discharge status: Home / Self Care | Attending: Internal Medicine | Admitting: Internal Medicine

## 2024-04-23 ENCOUNTER — Encounter: Payer: Self-pay | Admitting: Nurse Practitioner

## 2024-04-23 ENCOUNTER — Emergency Department (HOSPITAL_COMMUNITY)

## 2024-04-23 ENCOUNTER — Other Ambulatory Visit: Payer: Self-pay

## 2024-04-23 ENCOUNTER — Ambulatory Visit: Payer: Self-pay | Admitting: Nurse Practitioner

## 2024-04-23 ENCOUNTER — Encounter (HOSPITAL_COMMUNITY): Payer: Self-pay

## 2024-04-23 VITALS — BP 137/95 | HR 90 | Temp 98.2°F | Ht 65.0 in | Wt 237.0 lb

## 2024-04-23 DIAGNOSIS — E66812 Obesity, class 2: Secondary | ICD-10-CM | POA: Diagnosis present

## 2024-04-23 DIAGNOSIS — A419 Sepsis, unspecified organism: Secondary | ICD-10-CM

## 2024-04-23 DIAGNOSIS — Z833 Family history of diabetes mellitus: Secondary | ICD-10-CM | POA: Diagnosis not present

## 2024-04-23 DIAGNOSIS — M7501 Adhesive capsulitis of right shoulder: Secondary | ICD-10-CM | POA: Diagnosis present

## 2024-04-23 DIAGNOSIS — Z823 Family history of stroke: Secondary | ICD-10-CM

## 2024-04-23 DIAGNOSIS — Z885 Allergy status to narcotic agent status: Secondary | ICD-10-CM | POA: Diagnosis not present

## 2024-04-23 DIAGNOSIS — R1032 Left lower quadrant pain: Secondary | ICD-10-CM | POA: Diagnosis not present

## 2024-04-23 DIAGNOSIS — F419 Anxiety disorder, unspecified: Secondary | ICD-10-CM | POA: Diagnosis present

## 2024-04-23 DIAGNOSIS — N12 Tubulo-interstitial nephritis, not specified as acute or chronic: Secondary | ICD-10-CM | POA: Diagnosis present

## 2024-04-23 DIAGNOSIS — Z7951 Long term (current) use of inhaled steroids: Secondary | ICD-10-CM

## 2024-04-23 DIAGNOSIS — R3915 Urgency of urination: Secondary | ICD-10-CM

## 2024-04-23 DIAGNOSIS — F32A Depression, unspecified: Secondary | ICD-10-CM | POA: Diagnosis present

## 2024-04-23 DIAGNOSIS — K219 Gastro-esophageal reflux disease without esophagitis: Secondary | ICD-10-CM | POA: Diagnosis present

## 2024-04-23 DIAGNOSIS — Z79899 Other long term (current) drug therapy: Secondary | ICD-10-CM | POA: Diagnosis not present

## 2024-04-23 DIAGNOSIS — R7303 Prediabetes: Secondary | ICD-10-CM | POA: Diagnosis present

## 2024-04-23 DIAGNOSIS — Z8249 Family history of ischemic heart disease and other diseases of the circulatory system: Secondary | ICD-10-CM | POA: Diagnosis not present

## 2024-04-23 DIAGNOSIS — I10 Essential (primary) hypertension: Secondary | ICD-10-CM | POA: Diagnosis present

## 2024-04-23 DIAGNOSIS — M797 Fibromyalgia: Secondary | ICD-10-CM | POA: Diagnosis present

## 2024-04-23 DIAGNOSIS — R0902 Hypoxemia: Secondary | ICD-10-CM | POA: Diagnosis present

## 2024-04-23 DIAGNOSIS — F988 Other specified behavioral and emotional disorders with onset usually occurring in childhood and adolescence: Secondary | ICD-10-CM | POA: Diagnosis present

## 2024-04-23 DIAGNOSIS — R350 Frequency of micturition: Secondary | ICD-10-CM

## 2024-04-23 DIAGNOSIS — Z8616 Personal history of COVID-19: Secondary | ICD-10-CM | POA: Diagnosis not present

## 2024-04-23 DIAGNOSIS — Z880 Allergy status to penicillin: Secondary | ICD-10-CM

## 2024-04-23 DIAGNOSIS — Z87440 Personal history of urinary (tract) infections: Secondary | ICD-10-CM | POA: Diagnosis not present

## 2024-04-23 DIAGNOSIS — A4151 Sepsis due to Escherichia coli [E. coli]: Secondary | ICD-10-CM | POA: Diagnosis present

## 2024-04-23 DIAGNOSIS — N39498 Other specified urinary incontinence: Secondary | ICD-10-CM | POA: Diagnosis not present

## 2024-04-23 DIAGNOSIS — Z6839 Body mass index (BMI) 39.0-39.9, adult: Secondary | ICD-10-CM

## 2024-04-23 DIAGNOSIS — R109 Unspecified abdominal pain: Principal | ICD-10-CM

## 2024-04-23 DIAGNOSIS — G40909 Epilepsy, unspecified, not intractable, without status epilepticus: Secondary | ICD-10-CM | POA: Diagnosis present

## 2024-04-23 DIAGNOSIS — F429 Obsessive-compulsive disorder, unspecified: Secondary | ICD-10-CM | POA: Diagnosis present

## 2024-04-23 DIAGNOSIS — Z888 Allergy status to other drugs, medicaments and biological substances status: Secondary | ICD-10-CM

## 2024-04-23 LAB — COMPREHENSIVE METABOLIC PANEL WITH GFR
ALT: 129 U/L — ABNORMAL HIGH (ref 0–44)
AST: 60 U/L — ABNORMAL HIGH (ref 15–41)
Albumin: 4.3 g/dL (ref 3.5–5.0)
Alkaline Phosphatase: 180 U/L — ABNORMAL HIGH (ref 38–126)
Anion gap: 18 — ABNORMAL HIGH (ref 5–15)
BUN: 11 mg/dL (ref 6–20)
CO2: 19 mmol/L — ABNORMAL LOW (ref 22–32)
Calcium: 9.3 mg/dL (ref 8.9–10.3)
Chloride: 102 mmol/L (ref 98–111)
Creatinine, Ser: 0.87 mg/dL (ref 0.44–1.00)
GFR, Estimated: >60 mL/min
Glucose, Bld: 147 mg/dL — ABNORMAL HIGH (ref 70–99)
Potassium: 3.6 mmol/L (ref 3.5–5.1)
Sodium: 139 mmol/L (ref 135–145)
Total Bilirubin: 2.7 mg/dL — ABNORMAL HIGH (ref 0.0–1.2)
Total Protein: 8 g/dL (ref 6.5–8.1)

## 2024-04-23 LAB — URINALYSIS, ROUTINE W REFLEX MICROSCOPIC
Bilirubin Urine: NEGATIVE
Glucose, UA: NEGATIVE mg/dL
Ketones, ur: NEGATIVE mg/dL
Nitrite: NEGATIVE
Protein, ur: 30 mg/dL — AB
Specific Gravity, Urine: 1.043 — ABNORMAL HIGH (ref 1.005–1.030)
WBC, UA: >50 WBC/hpf (ref 0–5)
pH: 6 (ref 5.0–8.0)

## 2024-04-23 LAB — CBC WITH DIFFERENTIAL/PLATELET
Abs Immature Granulocytes: 0.05 K/uL (ref 0.00–0.07)
Basophils Absolute: 0 K/uL (ref 0.0–0.1)
Basophils Relative: 0 %
Eosinophils Absolute: 0 K/uL (ref 0.0–0.5)
Eosinophils Relative: 0 %
HCT: 38.5 % (ref 36.0–46.0)
Hemoglobin: 12.8 g/dL (ref 12.0–15.0)
Immature Granulocytes: 0 %
Lymphocytes Relative: 6 %
Lymphs Abs: 0.7 K/uL (ref 0.7–4.0)
MCH: 30 pg (ref 26.0–34.0)
MCHC: 33.2 g/dL (ref 30.0–36.0)
MCV: 90.4 fL (ref 80.0–100.0)
Monocytes Absolute: 1.2 K/uL — ABNORMAL HIGH (ref 0.1–1.0)
Monocytes Relative: 10 %
Neutro Abs: 10.1 K/uL — ABNORMAL HIGH (ref 1.7–7.7)
Neutrophils Relative %: 84 %
Platelets: 243 K/uL (ref 150–400)
RBC: 4.26 MIL/uL (ref 3.87–5.11)
RDW: 12.7 % (ref 11.5–15.5)
WBC: 12.2 K/uL — ABNORMAL HIGH (ref 4.0–10.5)
nRBC: 0 % (ref 0.0–0.2)

## 2024-04-23 LAB — LACTIC ACID, PLASMA: Lactic Acid, Venous: 0.6 mmol/L (ref 0.5–1.9)

## 2024-04-23 LAB — LIPASE, BLOOD: Lipase: <10 U/L — ABNORMAL LOW (ref 11–51)

## 2024-04-23 MED ORDER — SODIUM CHLORIDE 0.9 % IV SOLN
2.0000 g | INTRAVENOUS | Status: DC
  Administered 2024-04-24: 2 g via INTRAVENOUS
  Filled 2024-04-23: qty 20

## 2024-04-23 MED ORDER — ONDANSETRON HCL 4 MG/2ML IJ SOLN
4.0000 mg | Freq: Four times a day (QID) | INTRAMUSCULAR | Status: DC | PRN
  Administered 2024-04-24 – 2024-04-25 (×3): 4 mg via INTRAVENOUS
  Filled 2024-04-23 (×3): qty 2

## 2024-04-23 MED ORDER — SODIUM CHLORIDE 0.9 % IV BOLUS
1000.0000 mL | Freq: Once | INTRAVENOUS | Status: AC
  Administered 2024-04-23: 1000 mL via INTRAVENOUS

## 2024-04-23 MED ORDER — ENOXAPARIN SODIUM 60 MG/0.6ML IJ SOSY
55.0000 mg | PREFILLED_SYRINGE | Freq: Every day | INTRAMUSCULAR | Status: DC
  Administered 2024-04-23 – 2024-04-24 (×2): 55 mg via SUBCUTANEOUS
  Filled 2024-04-23 (×2): qty 0.6

## 2024-04-23 MED ORDER — POLYETHYLENE GLYCOL 3350 17 G PO PACK: 17.0000 g | PACK | Freq: Every day | ORAL | Status: DC | PRN

## 2024-04-23 MED ORDER — ACETAMINOPHEN 500 MG PO TABS
1000.0000 mg | ORAL_TABLET | Freq: Once | ORAL | Status: AC
  Administered 2024-04-23: 1000 mg via ORAL
  Filled 2024-04-23: qty 2

## 2024-04-23 MED ORDER — POTASSIUM CHLORIDE 20 MEQ PO PACK
40.0000 meq | PACK | Freq: Once | ORAL | Status: AC
  Administered 2024-04-23: 40 meq via ORAL
  Filled 2024-04-23: qty 2

## 2024-04-23 MED ORDER — HYDROMORPHONE HCL 1 MG/ML IJ SOLN
0.5000 mg | Freq: Once | INTRAMUSCULAR | Status: AC
  Administered 2024-04-23: 0.5 mg via INTRAVENOUS
  Filled 2024-04-23: qty 0.5

## 2024-04-23 MED ORDER — SODIUM CHLORIDE 0.9 % IV SOLN
2.0000 g | Freq: Once | INTRAVENOUS | Status: AC
  Administered 2024-04-23: 2 g via INTRAVENOUS
  Filled 2024-04-23: qty 20

## 2024-04-23 MED ORDER — IOHEXOL 300 MG/ML  SOLN
100.0000 mL | Freq: Once | INTRAMUSCULAR | Status: AC | PRN
  Administered 2024-04-23: 100 mL via INTRAVENOUS

## 2024-04-23 MED ORDER — KETOROLAC TROMETHAMINE 15 MG/ML IJ SOLN
15.0000 mg | Freq: Once | INTRAMUSCULAR | Status: AC
  Administered 2024-04-23: 15 mg via INTRAVENOUS
  Filled 2024-04-23: qty 1

## 2024-04-23 MED ORDER — LACTATED RINGERS IV BOLUS: 1000.0000 mL | Freq: Once | INTRAVENOUS | Status: DC

## 2024-04-23 MED ORDER — LACTATED RINGERS IV SOLN: INTRAVENOUS | Status: AC

## 2024-04-23 MED ORDER — ACETAMINOPHEN 325 MG PO TABS
650.0000 mg | ORAL_TABLET | Freq: Four times a day (QID) | ORAL | Status: DC | PRN
  Administered 2024-04-24 (×3): 650 mg via ORAL
  Filled 2024-04-23 (×3): qty 2

## 2024-04-23 MED ORDER — HYDROCODONE-ACETAMINOPHEN 5-325 MG PO TABS
1.0000 | ORAL_TABLET | Freq: Four times a day (QID) | ORAL | Status: DC | PRN
  Administered 2024-04-23 – 2024-04-24 (×3): 1 via ORAL
  Filled 2024-04-23 (×4): qty 1

## 2024-04-23 MED ORDER — ONDANSETRON HCL 4 MG PO TABS: 4.0000 mg | ORAL_TABLET | Freq: Four times a day (QID) | ORAL | Status: DC | PRN

## 2024-04-23 MED ORDER — ACETAMINOPHEN 650 MG RE SUPP: 650.0000 mg | Freq: Four times a day (QID) | RECTAL | Status: DC | PRN

## 2024-04-23 MED ADMIN — Ondansetron HCl Inj 4 MG/2ML (2 MG/ML): 4 mg | INTRAVENOUS | NDC 00409475518

## 2024-04-23 MED ADMIN — Lactated Ringer's Solution: 1000 mL | INTRAVENOUS | NDC 00264775000

## 2024-04-23 MED ADMIN — Hydromorphone HCl Inj 1 MG/ML: 0.5 mg | INTRAVENOUS | NDC 76045000996

## 2024-04-23 MED FILL — Ondansetron HCl Inj 4 MG/2ML (2 MG/ML): 4.0000 mg | INTRAMUSCULAR | Qty: 2 | Status: AC

## 2024-04-23 MED FILL — Hydromorphone HCl Inj 1 MG/ML: 0.5000 mg | INTRAMUSCULAR | Qty: 0.5 | Status: AC

## 2024-04-23 NOTE — H&P (Addendum)
 " History and Physical    Theresa [name] FMW:[id number] DOB: April 09, 1971 DOA: 04/23/2024  PCP: Alphonsa Glendia LABOR, MD   Patient coming from: Home  I have personally briefly reviewed patient's old medical records in [facility]  Chief Complaint: Abdominal pain  HPI: Theresa [name] is a 53 y.o. female with medical history significant for hypertension, diverticulosis, adhesive capsulitis of the right shoulder, attention deficit disorder. Patient presented to the ED with complaints of pain to her left lower abdomen that started about a week ago, she also reports urinary frequency over the past week.  She reports vomiting also. She reports history of frequent UTIs in the past when she was sexually active. No difficulty breathing, no cough.  ED Course: Tmax 102.7.  Heart rate 79 -104 blood pressure systolic 104 and 850.   WBC 12.2.  Elevated liver enzymes-AST 60, ALT 129, ALP 180.  T. bili 2.7. UA with moderate leukocytes and many bacteria. CTAP WC-bilateral pyelonephritis.  No abscess. Blood and urine cultures obtained. IV ceftriaxone  started.  Review of Systems: As per HPI all other systems reviewed and negative.  Past Medical History:  Diagnosis Date   Anxiety    Back pain    Borderline diabetic    Bruises easily 06/07/2022   CFS (chronic fatigue syndrome)    Constipation    COVID-19 12/21/2019   Depression    Diverticular disease    Dizzy 06/07/2022   Elevated liver enzymes    Encounter for screening fecal occult blood testing 04/12/2022   Encounter for well woman exam with routine gynecological exam 04/12/2022   Epilepsy (HCC)    Fatty liver    Fibromyalgia    Gallbladder problem    GERD (gastroesophageal reflux disease)    Headache 04/12/2012   Headache(784.0)    Heart aneurysm    Heart murmur    History of degenerative disc disease    Hyperglycemia 09/18/2012   Hyperplastic colon polyp 10/28/2019   Dr Debbie recommends repeat colonoscopy in 2028.    Hypertension    IBS (irritable bowel syndrome) w/ constipation    IFG (impaired fasting glucose)    Joint pain    Kidney problem    Lactose intolerance    Lower abdominal pain 04/12/2012   Migraines    Nausea without vomiting 02/07/2014   Obsessive-compulsive disorder    Osteoarthritis    S/P hysterectomy 04/12/2022   Screening cholesterol level 04/12/2022   Screening examination for STD (sexually transmitted disease) 04/12/2022   Screening for diabetes mellitus 04/12/2022   Screening for thyroid  disorder 04/12/2022   Seizures (HCC)    last seizure at age 36-stressed induced seizure.   SOB (shortness of breath)    Swallowing difficulty    Torn meniscus    Vertigo 06/07/2022   Vitamin D  deficiency     Past Surgical History:  Procedure Laterality Date   ABDOMINAL HYSTERECTOMY     still has right ovary   BIOPSY N/A 03/05/2014   Procedure: DUODENAL AND GASTRIC BIOPSY;  Surgeon: Margo LITTIE Haddock, MD;  Location: [facility];  Service: Endoscopy;  Laterality: N/A;   bladder sling X 3     CHILECTOMY Left 03/12/2020   Procedure: CHILECTOMY HALLUX;  Surgeon: Tobie Buckles, DPM;  Location: [facility];  Service: Podiatry;  Laterality: Left;   CHOLECYSTECTOMY     COLONOSCOPY  04/24/2012   DOQ:Dzddpoz polyp measuring 5 mm in size was found in the proximal transverse colon; polypectomy was performed with cold forceps Pedunculated polyp measuring  1.1 cm in size was found in the sigmoid colon; polypectomy was performed using snare cautery Moderate diverticulosis was noted in the descending colon and sigmoid colon small internal hemorrhoids. next tcs 04/2017   COLONOSCOPY WITH PROPOFOL  N/A 10/23/2019   sigmoid and descending colon diverticulosis, one 3 mm polyp in cecum, one 4 mm polyp in descending colon. Hyperplastic   ESOPHAGOGASTRODUODENOSCOPY N/A 02/18/2014   Procedure: ESOPHAGOGASTRODUODENOSCOPY (EGD);  Surgeon: Margo LITTIE Haddock, MD;  Location: [facility];  Service: Endoscopy;  Laterality:  N/A;  115   ESOPHAGOGASTRODUODENOSCOPY (EGD) WITH PROPOFOL  N/A 03/05/2014   SLF: 1. dyspepsia due to constipation, gastritis, and GERD 2. Multiple Gastric polyps 3. Moderate non-erosive gastritis 4. Diverticulum in the 2nd part of the duodenum   ESOPHAGOGASTRODUODENOSCOPY (EGD) WITH PROPOFOL  N/A 10/23/2019   normal esophagus s/p dilation, normal stomach, normal duodenum.   MALONEY DILATION N/A 10/23/2019   Procedure: AGAPITO DILATION;  Surgeon: Shaaron Lamar HERO, MD;  Location: [facility];  Service: Endoscopy;  Laterality: N/A;   POLYPECTOMY  10/23/2019   Procedure: POLYPECTOMY;  Surgeon: Shaaron Lamar HERO, MD;  Location: [facility];  Service: Endoscopy;;   TUBAL LIGATION       reports that she has never smoked. She has never used smokeless tobacco. She reports current alcohol use. She reports that she does not use drugs.  Allergies[1]  Family History  Problem Relation Age of Onset   Drug abuse Mother    Bipolar disorder Mother    Cancer Mother    Kidney disease Mother    Alcohol abuse Mother    Depression Mother    Anxiety disorder Mother    Paranoid behavior Mother    Hypertension Mother    Diabetes Mother    Breast cancer Mother    Obesity Mother    Obesity Father    Cancer Father    Stroke Father    Alcohol abuse Father    Hypertension Father    Diabetes Father    Physical abuse Brother    Healthy Daughter    GER disease Son    Healthy Son    Colon cancer Neg Hx    ADD / ADHD Neg Hx    Dementia Neg Hx    OCD Neg Hx    Schizophrenia Neg Hx    Seizures Neg Hx    Sexual abuse Neg Hx     Prior to Admission medications  Medication Sig Start Date End Date Taking? Authorizing Provider  albuterol  (VENTOLIN  HFA) 108 (90 Base) MCG/ACT inhaler Inhale 2 puffs into the lungs every 4 (four) hours as needed for wheezing or shortness of breath. 05/10/23   Hoskins, Carolyn C, NP  budesonide -formoterol  (SYMBICORT ) 80-4.5 MCG/ACT inhaler Inhale 2 puffs into the lungs 2 (two)  times daily. For cough and wheezing 03/05/24   Alphonsa Glendia LABOR, MD  clonazePAM  (KLONOPIN ) 0.5 MG tablet TAKE 1/2 TO 1 TABLET BY MOUTH TWICE DAILY FOR EXTREME ANXIETY 03/26/24   Mauro Elveria BROCKS, NP  dexlansoprazole  (DEXILANT ) 60 MG capsule Take 1 capsule (60 mg total) by mouth daily. For acid reflux 03/26/24   Mauro Elveria C, NP  lisinopril -hydrochlorothiazide  (ZESTORETIC ) 20-25 MG tablet Take 1 tablet by mouth daily. 03/26/24   Mauro Elveria BROCKS, NP  ondansetron  (ZOFRAN -ODT) 4 MG disintegrating tablet Take 1 tablet (4 mg total) by mouth every 8 (eight) hours as needed for nausea. 04/20/24   Mauro Elveria BROCKS, NP    Physical Exam: Vitals:   04/23/24 1700 04/23/24 1715 04/23/24 1730  04/23/24 1745  BP: 123/72 120/73  118/78  Pulse: 87 91 92 90  Resp:      Temp:      TempSrc:      SpO2: 94% 95% 91% 91%  Weight:      Height:        Constitutional: NAD, calm, comfortable Vitals:   04/23/24 1700 04/23/24 1715 04/23/24 1730 04/23/24 1745  BP: 123/72 120/73  118/78  Pulse: 87 91 92 90  Resp:      Temp:      TempSrc:      SpO2: 94% 95% 91% 91%  Weight:      Height:       Eyes: PERRL, lids and conjunctivae normal ENMT: Mucous membranes are moist.   Neck: normal, supple, no masses, no thyromegaly Respiratory: clear to auscultation bilaterally, no wheezing, no crackles. Normal respiratory effort. No accessory muscle use.  Cardiovascular: Regular rate and rhythm, no murmurs / rubs / gallops. No extremity edema.  Extremities warm  Abdomen: Mild LLQ tenderness, no masses palpated. No hepatosplenomegaly. .  Musculoskeletal: no clubbing / cyanosis. No joint deformity upper and lower extremities.  Skin: no rashes, lesions, ulcers. No induration Neurologic: No facial asymmetry, moves extremities spontaneously, speech fluent.  Psychiatric: Normal judgment and insight. Alert and oriented x 3. Normal mood.   Labs on Admission: I have personally reviewed following labs and imaging  studies  CBC: Recent Labs  Lab 04/23/24 1231  WBC 12.2*  NEUTROABS 10.1*  HGB 12.8  HCT 38.5  MCV 90.4  PLT 243   Basic Metabolic Panel: Recent Labs  Lab 04/23/24 1231  NA 139  K 3.6  CL 102  CO2 19*  GLUCOSE 147*  BUN 11  CREATININE 0.87  CALCIUM  9.3   GFR: Estimated Creatinine Clearance: 92.2 mL/min (by C-G formula based on SCr of 0.87 mg/dL). Liver Function Tests: Recent Labs  Lab 04/23/24 1231  AST 60*  ALT 129*  ALKPHOS 180*  BILITOT 2.7*  PROT 8.0  ALBUMIN 4.3   Recent Labs  Lab 04/23/24 1231  LIPASE <10*   Urine analysis:    Component Value Date/Time   COLORURINE AMBER (A) 04/23/2024 1648   APPEARANCEUR CLEAR 04/23/2024 1648   LABSPEC 1.043 (H) 04/23/2024 1648   PHURINE 6.0 04/23/2024 1648   GLUCOSEU NEGATIVE 04/23/2024 1648   HGBUR MODERATE (A) 04/23/2024 1648   BILIRUBINUR NEGATIVE 04/23/2024 1648   BILIRUBINUR negative 07/29/2023 1149   BILIRUBINUR Negative 05/26/2021 1652   KETONESUR NEGATIVE 04/23/2024 1648   PROTEINUR 30 (A) 04/23/2024 1648   UROBILINOGEN 0.2 07/29/2023 1149   NITRITE NEGATIVE 04/23/2024 1648   LEUKOCYTESUR MODERATE (A) 04/23/2024 1648    Radiological Exams on Admission: CT ABDOMEN PELVIS W CONTRAST Result Date: 04/23/2024 CLINICAL DATA:  Abdominal pain. EXAM: CT ABDOMEN AND PELVIS WITH CONTRAST TECHNIQUE: Multidetector CT imaging of the abdomen and pelvis was performed using the standard protocol following bolus administration of intravenous contrast. RADIATION DOSE REDUCTION: This exam was performed according to the departmental dose-optimization program which includes automated exposure control, adjustment of the mA and/or kV according to patient size and/or use of iterative reconstruction technique. CONTRAST:  OMNIPAQUE  IOHEXOL  300 MG/ML  SOLN COMPARISON:  CT abdomen pelvis with 08/13/2019. FINDINGS: Lower chest: The visualized lung bases are clear. No intra-abdominal free air or free fluid. Hepatobiliary: The  liver is unremarkable. No biliary dilatation. Cholecystectomy. Pancreas: Unremarkable. No pancreatic ductal dilatation or surrounding inflammatory changes. Spleen: Normal in size without focal abnormality. Adrenals/Urinary Tract: The  adrenal glands unremarkable. There is no hydronephrosis. Heterogeneous bilateral nephrograms, left greater than right, consistent with bilateral pyelonephritis. No abscess. The urinary bladder is unremarkable. Stomach/Bowel: There is sigmoid diverticulosis. There is no bowel obstruction or active inflammation. The appendix is normal. Vascular/Lymphatic: The abdominal aorta and IVC unremarkable. No portal venous gas. There is no adenopathy. Reproductive: Hysterectomy.  No suspicious adnexal masses. Other: None Musculoskeletal: Osteopenia.  No acute osseous pathology. IMPRESSION: 1. Bilateral pyelonephritis. No abscess. 2. Sigmoid diverticulosis. No bowel obstruction. Normal appendix. Electronically Signed   By: Vanetta Chou M.D.   On: 04/23/2024 16:12   EKG: None   Assessment/Plan Principal Problem:   Pyelonephritis Active Problems:   Sepsis (HCC)   Anxiety and depression   Attention deficit disorder (ADD) without hyperactivity   Essential hypertension, benign   Assessment and Plan:  Bilateral pyelonephritis with sepsis-meeting sepsis criteria with leukocytosis of 12.2, fever of 102.7, tachycardia heart rate 85-104.  UA suggestive of UTI with many bacteria , mod leukocytes.  CTAP WC shows bilateral pyelonephritis without abscess.  Last urine cultures from 01/2023 grew pansensitive Klebsiella. - 2 L bolus given, continue LR 100cc/hr x 10hrs - Follow-up blood and urine cultures -Continue IV ceftriaxone  - Hydrocodone  09/03/2023 q6h prn  Hypoxia-transient, O2 sat down to 88% on room air likely from opiates, now improved currently on room air-sats 93%. - Monitor for now  Elevated liver enzymes AST 60, ALT 129, ALP 180, T. bili 2.7.  Normal lipase < 10.  CT without  liver or gallbladder pathology.  Cholecystectomy. - Trend liver enzymes  Hypertension-blood pressure soft. -Hold lisinopril -HCTZ   DVT prophylaxis: Lovenox  Code Status: Full code Family Communication: None at bedside Disposition Plan: ~ 2 days Consults called: None Admission status: Inpatient, telemetry  I certify that at the point of admission it is my clinical judgment that the patient will require inpatient hospital care spanning beyond 2 midnights from the point of admission due to high intensity of service, high risk for further deterioration and high frequency of surveillance required.   Author: Tully FORBES Carwin, MD 04/23/2024 8:39 PM  For on call review [email or web address].      [1]  Allergies Allergen Reactions   Fentanyl  Other (See Comments)    Doesn't like the way it feels    Penicillins     Does not work for patient    Prednisone      Makes her feel bad, side effects   "

## 2024-04-23 NOTE — ED Triage Notes (Signed)
 Pt arrived via POV following PCP appointment for further evaluation of LLQ abdominal pain since last week. Pt reports last BM was a week ago as well. Per Pts chart, Pt advised to r/o diverticulitis vs constipation to explain her symptoms. Pt reports urinary continence began last Thursday evening as well.

## 2024-04-23 NOTE — ED Provider Notes (Signed)
" °  Physical Exam  BP 106/67   Pulse 85   Temp 98.4 F (36.9 C) (Oral)   Resp 15   Ht 5' 5 (1.651 m)   Wt 107.5 kg   SpO2 (!) 88%   BMI 39.44 kg/m   Physical Exam  Procedures  Procedures  ED Course / MDM   Clinical Course as of 04/23/24 1824  Mon Apr 23, 2024  1542 Assumed care from Dr Gennaro. 53 yo F hx of diverticulitis who is presenting with LLQ pain. No bm x 1 week. TTP on exam. Awaiting CT results. Also has elevated LFTs.  [RP]  1743 Patient now febrile.  Meets sepsis criteria.  Performed shared decision making with the patient and she is feeling too weak to go home so we will admit to the hospital. [RP]  1823 Discussed with Dr Pearlean for admission.  [RP]    Clinical Course User Index [RP] Yolande Lamar BROCKS, MD   Medical Decision Making Amount and/or Complexity of Data Reviewed Labs: ordered. Radiology: ordered.  Risk OTC drugs. Prescription drug management. Decision regarding hospitalization.      Yolande Lamar BROCKS, MD 04/23/24 [phone]  "

## 2024-04-23 NOTE — Progress Notes (Signed)
 PHARMACIST - PHYSICIAN COMMUNICATION  CONCERNING:  Enoxaparin  (Lovenox ) for DVT Prophylaxis    RECOMMENDATION: Patient was prescribed enoxaprin 40mg  q24 hours for VTE prophylaxis.   Filed Weights   04/23/24 1158  Weight: 107.5 kg (237 lb)    Body mass index is 39.44 kg/m.  Estimated Creatinine Clearance: 92.2 mL/min (by C-G formula based on SCr of 0.87 mg/dL).   Based on [facility] policy patient is candidate for enoxaparin  0.5mg /kg TBW SQ every 24 hours based on BMI being >30.   DESCRIPTION: Pharmacy has adjusted enoxaparin  dose per [facility] policy.  Patient is now receiving enoxaparin  55 mg every 24 hours    Annabella LOISE Banks, PharmD Clinical Pharmacist  04/23/2024 8:03 PM

## 2024-04-23 NOTE — Progress Notes (Addendum)
 "  Subjective:    Patient ID: Theresa [name], female    DOB: [birth year], 53 y.o.   MRN: [id number]  HPI Nurses note: Urinary frequency, blood in urine. Migraine, no bladder control, chills, cold sweats Started Dec 17th. PT mentioned diverticulitis flair  Discussed the use of AI scribe software for clinical note transcription with the patient, who gave verbal consent to proceed.  History of Present Illness Theresa [name] is a 53 year old female who presents with severe constipation and urinary incontinence.  She has been experiencing persistent left-sided abdominal pain for several months, with no significant bowel movement in over a week. Bowel movements are minimal and require excessive wiping due to thickness. No blood in stools is noted. She has a history of diverticulosis on recent colonoscopy.   She reports new onset urinary incontinence, starting with an episode of no bladder control one night, followed by urinating on herself multiple times over the weekend. Her urine is described as very dark, similar to tea, but she is unsure if there is blood present. No burning sensation during urination is noted, but there is increased urgency. Fluid intake limited. Has had nausea.   She experiences generalized body pain, particularly in the low back, and severe headaches. She has been having fever, cold chills, and hot sweats, with chills severe enough to cause shaking. Despite these symptoms, she has not sought medical attention previously due to concerns about exposure to other illnesses.  She mentions her oxygen  levels have been as low as 90, and she experiences chills despite a normal temperature reading at the visit. She has been nauseated and has a history of diverticula noted in past evaluations. Her last A1c was 5.8 in November, indicating good blood sugar control. No recent bladder infections are reported.        Objective:   Physical Exam Vitals and nursing note reviewed.   Constitutional:      General: She is not in acute distress.    Appearance: She is ill-appearing.  Cardiovascular:     Rate and Rhythm: Normal rate and regular rhythm.  Pulmonary:     Effort: Pulmonary effort is normal.     Breath sounds: Normal breath sounds.  Abdominal:     General: Bowel sounds are normal. There is no distension.     Palpations: Abdomen is soft. There is no mass.     Tenderness: There is abdominal tenderness. There is guarding. There is no right CVA tenderness, left CVA tenderness or rebound.     Comments: Epigastric area tenderness. Also tenderness mid abdomen but specifically LLQ. Bowel sounds present all 4 quadrants.   Skin:    General: Skin is warm and dry.  Neurological:     Mental Status: She is alert and oriented to person, place, and time.  Psychiatric:        Mood and Affect: Mood normal.        Behavior: Behavior normal.        Thought Content: Thought content normal.    Today's Vitals   04/23/24 1054  BP: (!) 137/95  Pulse: 90  Temp: 98.2 F (36.8 C)  SpO2: 99%  Weight: 237 lb (107.5 kg)  Height: 5' 5 (1.651 m)   Body mass index is 39.44 kg/m.  03/26/24 A1C 5.8.       Assessment & Plan:  1. LLQ pain (Primary) Acute left lower quadrant abdominal pain with severe constipation, rule out diverticulitis Differential includes diverticulitis and severe constipation. Fever  and chills suggest possible systemic infection. - Ordered stat CT scan to evaluate for diverticulitis or other abdominal pathology. - Ordered stat blood work including CBC and metabolic profile. - Sent urine for urinalysis and reflex to culture.  - CBC with Differential - Comprehensive metabolic panel - CT ABDOMEN PELVIS W CONTRAST - Urinalysis, Routine w reflex microscopic - Urine Culture  2. Other urinary incontinence Urinary incontinence and possible urinary tract infection Possible urinary tract infection considered due to dark urine. - Sent urine for urinalysis  and reflex to culture.  - CT ABDOMEN PELVIS W CONTRAST - Urinalysis, Routine w reflex microscopic - Urine Culture  3. Urinary frequency Urinary incontinence and possible urinary tract infection Possible urinary tract infection considered due to dark urine. - Sent urine for urinalysis and reflex to culture.  - Urine Culture  4. Urinary urgency Urinary incontinence and possible urinary tract infection Possible urinary tract infection considered due to dark urine. - Sent urine for urinalysis and reflex to culture.  - Urine Culture   Further follow based on test results. Patient to go to ED immediately if worsening symptoms.   Addendum: Patient states she feels so sick she would have difficulty going for stat work up. She agrees to go to ED for evaluation. Spoke with charge nurse about her status and available note in chart.   "

## 2024-04-23 NOTE — ED Provider Notes (Signed)
 " [place] EMERGENCY DEPARTMENT AT [facility] Provider Note   CSN: [id number] Arrival date & time: 04/23/24  1140     Patient presents with: Abdominal Pain   Theresa [name] is a 53 y.o. female.   53 year old female sent in by primary care for evaluation of left lower quadrant abdominal pain.  States she has had the pain for a week has been getting worse.  She has a history of diverticulitis in the past.  She is also had some nausea and vomiting as well.  Denies any diarrhea but states she has been constipated.  Denies any other symptoms or concerns at this time.   Abdominal Pain Associated symptoms: nausea and vomiting   Associated symptoms: no chest pain, no chills, no cough, no dysuria, no fever, no hematuria, no shortness of breath and no sore throat        Prior to Admission medications  Medication Sig Start Date End Date Taking? Authorizing Provider  albuterol  (VENTOLIN  HFA) 108 (90 Base) MCG/ACT inhaler Inhale 2 puffs into the lungs every 4 (four) hours as needed for wheezing or shortness of breath. 05/10/23   Mauro Elveria BROCKS, NP  budesonide -formoterol  (SYMBICORT ) 80-4.5 MCG/ACT inhaler Inhale 2 puffs into the lungs 2 (two) times daily. For cough and wheezing 03/05/24   Alphonsa Glendia LABOR, MD  clonazePAM  (KLONOPIN ) 0.5 MG tablet TAKE 1/2 TO 1 TABLET BY MOUTH TWICE DAILY FOR EXTREME ANXIETY 03/26/24   Mauro Elveria BROCKS, NP  dexlansoprazole  (DEXILANT ) 60 MG capsule Take 1 capsule (60 mg total) by mouth daily. For acid reflux 03/26/24   Mauro Elveria C, NP  lisinopril -hydrochlorothiazide  (ZESTORETIC ) 20-25 MG tablet Take 1 tablet by mouth daily. 03/26/24   Mauro Elveria BROCKS, NP  ondansetron  (ZOFRAN -ODT) 4 MG disintegrating tablet Take 1 tablet (4 mg total) by mouth every 8 (eight) hours as needed for nausea. 04/20/24   Mauro Elveria BROCKS, NP    Allergies: Fentanyl , Penicillins, and Prednisone     Review of Systems  Constitutional:  Negative for chills and fever.   HENT:  Negative for ear pain and sore throat.   Eyes:  Negative for pain and visual disturbance.  Respiratory:  Negative for cough and shortness of breath.   Cardiovascular:  Negative for chest pain and palpitations.  Gastrointestinal:  Positive for abdominal pain, nausea and vomiting.  Genitourinary:  Negative for dysuria and hematuria.  Musculoskeletal:  Negative for arthralgias and back pain.  Skin:  Negative for color change and rash.  Neurological:  Negative for seizures and syncope.  All other systems reviewed and are negative.   Updated Vital Signs BP 106/67   Pulse 85   Temp 98.4 F (36.9 C) (Oral)   Resp 15   Ht 5' 5 (1.651 m)   Wt 107.5 kg   SpO2 (!) 88%   BMI 39.44 kg/m   Physical Exam Vitals and nursing note reviewed.  Constitutional:      General: She is not in acute distress.    Appearance: She is well-developed. She is ill-appearing.  HENT:     Head: Normocephalic and atraumatic.  Eyes:     Conjunctiva/sclera: Conjunctivae normal.  Cardiovascular:     Rate and Rhythm: Regular rhythm. Tachycardia present.     Heart sounds: No murmur heard. Pulmonary:     Effort: Pulmonary effort is normal. No respiratory distress.     Breath sounds: Normal breath sounds.  Abdominal:     Palpations: Abdomen is soft.     Tenderness: There  is abdominal tenderness in the left lower quadrant.  Musculoskeletal:        General: No swelling.     Cervical back: Neck supple.  Skin:    General: Skin is warm and dry.     Capillary Refill: Capillary refill takes less than 2 seconds.  Neurological:     Mental Status: She is alert.  Psychiatric:        Mood and Affect: Mood normal.     (all labs ordered are listed, but only abnormal results are displayed) Labs Reviewed  LIPASE, BLOOD - Abnormal; Notable for the following components:      Result Value   Lipase <10 (*)    All other components within normal limits  COMPREHENSIVE METABOLIC PANEL WITH GFR - Abnormal; Notable  for the following components:   CO2 19 (*)    Glucose, Bld 147 (*)    AST 60 (*)    ALT 129 (*)    Alkaline Phosphatase 180 (*)    Total Bilirubin 2.7 (*)    Anion gap 18 (*)    All other components within normal limits  CBC WITH DIFFERENTIAL/PLATELET - Abnormal; Notable for the following components:   WBC 12.2 (*)    Neutro Abs 10.1 (*)    Monocytes Absolute 1.2 (*)    All other components within normal limits  URINE CULTURE  URINALYSIS, ROUTINE W REFLEX MICROSCOPIC    EKG: None  Radiology: No results found.   Procedures   Medications Ordered in the ED  ondansetron  (ZOFRAN ) injection 4 mg (4 mg Intravenous Given 04/23/24 1314)  HYDROmorphone  (DILAUDID ) injection 0.5 mg (0.5 mg Intravenous Given 04/23/24 1314)  lactated ringers  bolus 1,000 mL (0 mLs Intravenous Stopped 04/23/24 1414)  iohexol  (OMNIPAQUE ) 300 MG/ML solution 100 mL (100 mLs Intravenous Contrast Given 04/23/24 1505)    Clinical Course as of 04/23/24 1552  Mon Apr 23, 2024  1542 Assumed care from Dr Gennaro. 53 yo F hx of diverticulitis who is presenting with LLQ pain. No bm x 1 week. TTP on exam. Awaiting CT results. Also has elevated LFTs.  [RP]    Clinical Course User Index [RP] Yolande Lamar BROCKS, MD                                 Medical Decision Making Patient here for left lower quadrant abdominal pain.  Vitals are stable but she is uncomfortable on exam.  Given fluids Zofran  and Dilaudid .  Lab workup fairly unremarkable except for mild leukocytosis.  CT abdomen pelvis pending at this time.  Patient signed out to oncoming provider at 3:30 PM pending remainder of workup and ultimate disposition  Problems Addressed: Abdominal pain, unspecified abdominal location: undiagnosed new problem with uncertain prognosis  Amount and/or Complexity of Data Reviewed External Data Reviewed: notes.    Details: Outpatient records reviewed patient was sent in by primary care provider today for further  workup Labs: ordered. Decision-making details documented in ED Course.    Details: Ordered and and reviewed by me and patient with leukocytosis Radiology: ordered and independent interpretation performed. Decision-making details documented in ED Course.    Details: Ordered and pending  Risk OTC drugs. Prescription drug management. Parenteral controlled substances. Drug therapy requiring intensive monitoring for toxicity.     Final diagnoses:  Abdominal pain, unspecified abdominal location    ED Discharge Orders     None  Gennaro Duwaine CROME, DO 04/23/24 1552  "

## 2024-04-23 NOTE — Progress Notes (Signed)
 Urinary frequency, blood in urine. Migraine, no bladder control, chills, cold sweats Started Dec 17th. PT mentioned diverticulitis flar

## 2024-04-23 NOTE — ED Notes (Signed)
 Pt transported upstairs to admission bed

## 2024-04-24 ENCOUNTER — Encounter: Payer: Self-pay | Admitting: Nurse Practitioner

## 2024-04-24 DIAGNOSIS — F419 Anxiety disorder, unspecified: Secondary | ICD-10-CM | POA: Diagnosis not present

## 2024-04-24 DIAGNOSIS — E66812 Obesity, class 2: Secondary | ICD-10-CM

## 2024-04-24 DIAGNOSIS — I10 Essential (primary) hypertension: Secondary | ICD-10-CM | POA: Diagnosis not present

## 2024-04-24 DIAGNOSIS — F32A Depression, unspecified: Secondary | ICD-10-CM | POA: Diagnosis not present

## 2024-04-24 DIAGNOSIS — K219 Gastro-esophageal reflux disease without esophagitis: Secondary | ICD-10-CM

## 2024-04-24 DIAGNOSIS — N12 Tubulo-interstitial nephritis, not specified as acute or chronic: Secondary | ICD-10-CM | POA: Diagnosis not present

## 2024-04-24 DIAGNOSIS — F988 Other specified behavioral and emotional disorders with onset usually occurring in childhood and adolescence: Secondary | ICD-10-CM | POA: Diagnosis not present

## 2024-04-24 DIAGNOSIS — A419 Sepsis, unspecified organism: Secondary | ICD-10-CM | POA: Diagnosis not present

## 2024-04-24 LAB — CBC
HCT: 31.3 % — ABNORMAL LOW (ref 36.0–46.0)
Hemoglobin: 10.7 g/dL — ABNORMAL LOW (ref 12.0–15.0)
MCH: 31.3 pg (ref 26.0–34.0)
MCHC: 34.2 g/dL (ref 30.0–36.0)
MCV: 91.5 fL (ref 80.0–100.0)
Platelets: 194 K/uL (ref 150–400)
RBC: 3.42 MIL/uL — ABNORMAL LOW (ref 3.87–5.11)
RDW: 12.9 % (ref 11.5–15.5)
WBC: 10.2 K/uL (ref 4.0–10.5)
nRBC: 0 % (ref 0.0–0.2)

## 2024-04-24 LAB — COMPREHENSIVE METABOLIC PANEL WITH GFR
ALT: 81 U/L — ABNORMAL HIGH (ref 0–44)
AST: 33 U/L (ref 15–41)
Albumin: 3.4 g/dL — ABNORMAL LOW (ref 3.5–5.0)
Alkaline Phosphatase: 132 U/L — ABNORMAL HIGH (ref 38–126)
Anion gap: 14 (ref 5–15)
BUN: 11 mg/dL (ref 6–20)
CO2: 19 mmol/L — ABNORMAL LOW (ref 22–32)
Calcium: 8 mg/dL — ABNORMAL LOW (ref 8.9–10.3)
Chloride: 104 mmol/L (ref 98–111)
Creatinine, Ser: 0.72 mg/dL (ref 0.44–1.00)
GFR, Estimated: >60 mL/min
Glucose, Bld: 111 mg/dL — ABNORMAL HIGH (ref 70–99)
Potassium: 3.9 mmol/L (ref 3.5–5.1)
Sodium: 137 mmol/L (ref 135–145)
Total Bilirubin: 1.9 mg/dL — ABNORMAL HIGH (ref 0.0–1.2)
Total Protein: 6.2 g/dL — ABNORMAL LOW (ref 6.5–8.1)

## 2024-04-24 LAB — HIV ANTIBODY (ROUTINE TESTING W REFLEX): HIV Screen 4th Generation wRfx: NONREACTIVE

## 2024-04-24 MED ORDER — KETOROLAC TROMETHAMINE 15 MG/ML IJ SOLN
15.0000 mg | Freq: Once | INTRAMUSCULAR | Status: AC
  Administered 2024-04-24: 15 mg via INTRAVENOUS
  Filled 2024-04-24: qty 1

## 2024-04-24 MED ORDER — PANTOPRAZOLE SODIUM 40 MG PO TBEC
40.0000 mg | DELAYED_RELEASE_TABLET | Freq: Every day | ORAL | Status: DC
  Administered 2024-04-25: 40 mg via ORAL
  Filled 2024-04-24 (×2): qty 1

## 2024-04-24 MED ORDER — CLONAZEPAM 0.5 MG PO TABS: 0.5000 mg | ORAL_TABLET | Freq: Two times a day (BID) | ORAL | Status: DC | PRN

## 2024-04-24 MED ORDER — POLYETHYL GLYCOL-PROPYL GLYCOL 0.4-0.3 % OP SOLN: 1.0000 [drp] | Freq: Every day | OPHTHALMIC | Status: DC | PRN

## 2024-04-24 MED ORDER — LACTATED RINGERS IV SOLN: INTRAVENOUS | Status: AC

## 2024-04-24 NOTE — Assessment & Plan Note (Signed)
-  Continue as needed anxiolytics. 

## 2024-04-24 NOTE — Assessment & Plan Note (Signed)
-   Continue to hold antihypertensive agents at the moment; patient blood pressure soft without any medication - Maintain adequate hydration - Heart healthy/low-sodium diet discussed with patient.

## 2024-04-24 NOTE — Plan of Care (Signed)
   Problem: Education: Goal: Knowledge of General Education information will improve Description Including pain rating scale, medication(s)/side effects and non-pharmacologic comfort measures Outcome: Progressing

## 2024-04-24 NOTE — Assessment & Plan Note (Signed)
-  Body mass index is 39.44 kg/m.  - Low-calorie diet, portion control and increase physical activity discussed with patient.

## 2024-04-24 NOTE — Progress Notes (Signed)
 " Theresa [name]  FMW:[id number] DOB: 1970-11-22 DOA: 04/23/2024  PCP: Alphonsa Glendia LABOR, MD   Chief Complaint  Patient presents with   Abdominal Pain    Brief Hospital admission narrative:  As per H&P written by Dr. Pearlean on 04/23/2024 PA Theresa [name] is a 53 y.o. female with medical history significant for hypertension, diverticulosis, adhesive capsulitis of the right shoulder, attention deficit disorder. Patient presented to the ED with complaints of pain to her left lower abdomen that started about a week ago, she also reports urinary frequency over the past week.  She reports vomiting also. She reports history of frequent UTIs in the past when she was sexually active. No difficulty breathing, no cough.   ED Course: Tmax 102.7.  Heart rate 79 -104 blood pressure systolic 104 and 850.   WBC 12.2.  Elevated liver enzymes-AST 60, ALT 129, ALP 180.  T. bili 2.7. UA with moderate leukocytes and many bacteria. CTAP WC-bilateral pyelonephritis.  No abscess. Blood and urine cultures obtained. IV ceftriaxone  started.  Assessment & Plan: Assessment & Plan Pyelonephritis - Continue to maintain adequate hydration - Continue as needed antipyretics, analgesics and antiemetics. - Continue current IV antibiotics and follow culture result - Continue supportive care. Sepsis (HCC) - Patient may criteria for sepsis secondary to UTI at time of admission - Adequately responding to treatment provided - Will continue supportive care, adequate hydration and follow clinical response. Anxiety and depression - Continue as needed anxiolytics. Attention deficit disorder (ADD) without hyperactivity - Appears to be stable - Currently not taking medications as an outpatient 4. Essential hypertension, benign - Continue to hold antihypertensive agents at the moment; patient blood pressure soft without any medication - Maintain adequate hydration - Heart healthy/low-sodium diet  discussed with patient. Class 2 obesity -Body mass index is 39.44 kg/m.  - Low-calorie diet, portion control and increase physical activity discussed with patient. GERD (gastroesophageal reflux disease) - Continue PPI.   DVT prophylaxis: Lovenox  Code Status: Full code. Family Communication: No family at bedside. Disposition:   Status is: Inpatient Remains inpatient appropriate because: Continue IV antibiotics.   Consultants:  None  Procedures: See below for x-ray reports.  Antimicrobials: Ceftriaxone   Subjective: Currently afebrile, no chest pain, no nausea or vomiting.  Patient reports feeling better and was in no acute distress on exam.  Objective: Vitals:   04/23/24 1938 04/23/24 2330 04/24/24 0346 04/24/24 1358  BP: 104/64 113/67 116/77 131/86  Pulse: 79 70 86 84  Resp: 18 18 18 16   Temp: 99.8 F (37.7 C) 98.6 F (37 C) (!) 100.4 F (38 C) 98.7 F (37.1 C)  TempSrc: Oral Oral Oral Oral  SpO2: 93% 96% 93% 100%  Weight:      Height:        Intake/Output Summary (Last 24 hours) at 04/24/2024 1713 Last data filed at 04/24/2024 0409 Gross per 24 hour  Intake 2544.7 ml  Output --  Net 2544.7 ml   Filed Weights   04/23/24 1158  Weight: 107.5 kg    Examination:  General exam: Appears calm and reports feeling better. Respiratory system: Good saturation on room air. Cardiovascular system: S1 & S2 heard, RRR. No JVD, no rubs, no gallops; no edema appreciated on exam. Gastrointestinal system: Abdomen is obese, nondistended, soft and nontender. No organomegaly or masses felt. Normal bowel sounds heard. Central nervous system: Alert and oriented. No focal neurological deficits. Extremities: Symmetric 5 x 5 power.  No cyanosis or clubbing. Skin:  No petechiae. Psychiatry: Judgement and insight appear normal. Mood & affect appropriate.     Data Reviewed: I have personally reviewed following labs and imaging studies  CBC: Recent Labs  Lab 04/23/24 1231  04/24/24 0450  WBC 12.2* 10.2  NEUTROABS 10.1*  --   HGB 12.8 10.7*  HCT 38.5 31.3*  MCV 90.4 91.5  PLT 243 194    Basic Metabolic Panel: Recent Labs  Lab 04/23/24 1231 04/24/24 0450  NA 139 137  K 3.6 3.9  CL 102 104  CO2 19* 19*  GLUCOSE 147* 111*  BUN 11 11  CREATININE 0.87 0.72  CALCIUM  9.3 8.0*    GFR: Estimated Creatinine Clearance: 100.3 mL/min (by C-G formula based on SCr of 0.72 mg/dL).  Liver Function Tests: Recent Labs  Lab 04/23/24 1231 04/24/24 0450  AST 60* 33  ALT 129* 81*  ALKPHOS 180* 132*  BILITOT 2.7* 1.9*  PROT 8.0 6.2*  ALBUMIN 4.3 3.4*    CBG: No results for input(s): GLUCAP in the last 168 hours.   Recent Results (from the past 240 hours)  Blood culture (routine x 2)     Status: None (Preliminary result)   Collection Time: 04/23/24  6:18 PM   Specimen: BLOOD LEFT FOREARM  Result Value Ref Range Status   Specimen Description   Final    BLOOD LEFT FOREARM BOTTLES DRAWN AEROBIC AND ANAEROBIC   Special Requests Blood Culture adequate volume  Final   Culture   Final    NO GROWTH < 24 HOURS Performed at [facility], [address], [place], [place] [address]    Report Status PENDING  Incomplete  Blood culture (routine x 2)     Status: None (Preliminary result)   Collection Time: 04/23/24  6:18 PM   Specimen: BLOOD LEFT HAND  Result Value Ref Range Status   Specimen Description   Final    BLOOD LEFT HAND BOTTLES DRAWN AEROBIC AND ANAEROBIC   Special Requests   Final    Blood Culture results may not be optimal due to an inadequate volume of blood received in culture bottles   Culture   Final    NO GROWTH < 24 HOURS Performed at [facility], [address], [place], [place] [address]    Report Status PENDING  Incomplete    Radiology Studies: CT ABDOMEN PELVIS W CONTRAST Result Date: 04/23/2024 CLINICAL DATA:  Abdominal pain. EXAM: CT ABDOMEN AND PELVIS WITH CONTRAST TECHNIQUE: Multidetector CT imaging of the abdomen and pelvis  was performed using the standard protocol following bolus administration of intravenous contrast. RADIATION DOSE REDUCTION: This exam was performed according to the departmental dose-optimization program which includes automated exposure control, adjustment of the mA and/or kV according to patient size and/or use of iterative reconstruction technique. CONTRAST:  OMNIPAQUE  IOHEXOL  300 MG/ML  SOLN COMPARISON:  CT abdomen pelvis with 08/13/2019. FINDINGS: Lower chest: The visualized lung bases are clear. No intra-abdominal free air or free fluid. Hepatobiliary: The liver is unremarkable. No biliary dilatation. Cholecystectomy. Pancreas: Unremarkable. No pancreatic ductal dilatation or surrounding inflammatory changes. Spleen: Normal in size without focal abnormality. Adrenals/Urinary Tract: The adrenal glands unremarkable. There is no hydronephrosis. Heterogeneous bilateral nephrograms, left greater than right, consistent with bilateral pyelonephritis. No abscess. The urinary bladder is unremarkable. Stomach/Bowel: There is sigmoid diverticulosis. There is no bowel obstruction or active inflammation. The appendix is normal. Vascular/Lymphatic: The abdominal aorta and IVC unremarkable. No portal venous gas. There is no adenopathy. Reproductive: Hysterectomy.  No suspicious adnexal  masses. Other: None Musculoskeletal: Osteopenia.  No acute osseous pathology. IMPRESSION: 1. Bilateral pyelonephritis. No abscess. 2. Sigmoid diverticulosis. No bowel obstruction. Normal appendix. Electronically Signed   By: Vanetta Chou M.D.   On: 04/23/2024 16:12   Scheduled Meds:  enoxaparin  (LOVENOX ) injection  55 mg Subcutaneous QHS   Continuous Infusions:  cefTRIAXone  (ROCEPHIN )  IV 2 g (04/24/24 1645)     LOS: 1 day    Time spent: 50 minutes    Eric Nunnery, MD [facility]   To contact the attending provider between 7A-7P or the covering provider during after hours 7P-7A, please log into the web  site [email or web address] and access using universal [place] password for that web site. If you do not have the password, please call the hospital operator.  04/24/2024, 5:13 PM    "

## 2024-04-24 NOTE — Assessment & Plan Note (Signed)
-   Appears to be stable - Currently not taking medications as an outpatient 4.

## 2024-04-24 NOTE — Assessment & Plan Note (Signed)
 Patient will continue tums Ultra

## 2024-04-24 NOTE — Plan of Care (Signed)
  Problem: Education: Goal: Knowledge of General Education information will improve Description: Including pain rating scale, medication(s)/side effects and non-pharmacologic comfort measures Outcome: Progressing   Problem: Clinical Measurements: Goal: Ability to maintain clinical measurements within normal limits will improve Outcome: Progressing Goal: Diagnostic test results will improve Outcome: Progressing   Problem: Pain Managment: Goal: General experience of comfort will improve and/or be controlled Outcome: Progressing   Problem: Safety: Goal: Ability to remain free from injury will improve Outcome: Progressing

## 2024-04-24 NOTE — Assessment & Plan Note (Signed)
-   Continue to maintain adequate hydration - Continue as needed antipyretics, analgesics and antiemetics. - Continue current IV antibiotics and follow culture result - Continue supportive care.

## 2024-04-24 NOTE — Progress Notes (Signed)
 Transition of Care Department [facility]) has reviewed patient and no other TOC needs have been identified at this time. We will continue to monitor patient advancement through interdisciplinary progression rounds. If new patient transition needs arise, please place a TOC consult.   04/24/24 0748  TOC Brief Assessment  Insurance and Status Reviewed  Patient has primary care physician Yes  Home environment has been reviewed Lives alone.  Prior level of function: Independent.  Prior/Current Home Services No current home services  Social Drivers of Health Review SDOH reviewed no interventions necessary  Readmission risk has been reviewed Yes  Transition of care needs no transition of care needs at this time

## 2024-04-24 NOTE — Assessment & Plan Note (Signed)
-   Patient may criteria for sepsis secondary to UTI at time of admission - Adequately responding to treatment provided - Will continue supportive care, adequate hydration and follow clinical response.

## 2024-04-25 DIAGNOSIS — N12 Tubulo-interstitial nephritis, not specified as acute or chronic: Secondary | ICD-10-CM | POA: Diagnosis not present

## 2024-04-25 LAB — CBC
HCT: 31.6 % — ABNORMAL LOW (ref 36.0–46.0)
Hemoglobin: 10.6 g/dL — ABNORMAL LOW (ref 12.0–15.0)
MCH: 30.8 pg (ref 26.0–34.0)
MCHC: 33.5 g/dL (ref 30.0–36.0)
MCV: 91.9 fL (ref 80.0–100.0)
Platelets: 213 K/uL (ref 150–400)
RBC: 3.44 MIL/uL — ABNORMAL LOW (ref 3.87–5.11)
RDW: 12.6 % (ref 11.5–15.5)
WBC: 6.7 K/uL (ref 4.0–10.5)
nRBC: 0 % (ref 0.0–0.2)

## 2024-04-25 LAB — BASIC METABOLIC PANEL WITH GFR
Anion gap: 8 (ref 5–15)
BUN: 8 mg/dL (ref 6–20)
CO2: 27 mmol/L (ref 22–32)
Calcium: 8.4 mg/dL — ABNORMAL LOW (ref 8.9–10.3)
Chloride: 104 mmol/L (ref 98–111)
Creatinine, Ser: 0.85 mg/dL (ref 0.44–1.00)
GFR, Estimated: >60 mL/min
Glucose, Bld: 113 mg/dL — ABNORMAL HIGH (ref 70–99)
Potassium: 4.2 mmol/L (ref 3.5–5.1)
Sodium: 139 mmol/L (ref 135–145)

## 2024-04-25 LAB — URINE CULTURE
Culture: >=100000 — AB
Special Requests: NORMAL

## 2024-04-25 MED ORDER — BUTALBITAL-APAP-CAFFEINE 50-325-40 MG PO TABS: 1.0000 | ORAL_TABLET | ORAL | 0 refills | Status: AC | PRN

## 2024-04-25 MED ORDER — BUTALBITAL-APAP-CAFFEINE 50-325-40 MG PO TABS
1.0000 | ORAL_TABLET | ORAL | Status: DC | PRN
  Administered 2024-04-25: 1 via ORAL
  Filled 2024-04-25: qty 1

## 2024-04-25 MED ORDER — CEFADROXIL 500 MG PO CAPS: 500.0000 mg | ORAL_CAPSULE | Freq: Two times a day (BID) | ORAL | 0 refills | Status: AC

## 2024-04-25 NOTE — Plan of Care (Signed)
  Problem: Education: Goal: Knowledge of General Education information will improve Description: Including pain rating scale, medication(s)/side effects and non-pharmacologic comfort measures Outcome: Progressing   Problem: Coping: Goal: Level of anxiety will decrease Outcome: Progressing   Problem: Pain Managment: Goal: General experience of comfort will improve and/or be controlled Outcome: Progressing

## 2024-04-25 NOTE — Discharge Summary (Signed)
 Physician Discharge Summary  TANGIE STAY FMW:[id number] DOB: 1971-04-18 DOA: 04/23/2024  PCP: Alphonsa Glendia LABOR, MD  Admit date: 04/23/2024  Discharge date: 04/25/2024  Admitted From:Home  Disposition:  Home  Recommendations for Outpatient Follow-up:  Follow up with PCP in 1-2 weeks Remain on Duricef as prescribed for 7 more days to complete course of treatment for pyelonephritis Remain on Fioricet  as prescribed for migraine headaches Continue other home medications as prior  Home Health: None  Equipment/Devices: None  Discharge Condition:Stable  CODE STATUS: Full  Diet recommendation: Heart Healthy  Brief/Interim Summary:  Theresa [name] is a 53 y.o. female with medical history significant for hypertension, diverticulosis, adhesive capsulitis of the right shoulder, attention deficit disorder. Patient presented to the ED with complaints of pain to her left lower abdomen that started about a week ago, she also reports urinary frequency over the past week.  She reports vomiting also.  She was admitted for sepsis, POA secondary to pyelonephritis and was started on IV antibiotics with Rocephin .  No growth on blood cultures noted and urine cultures positive for E. coli that appears sensitive to several antibiotics.  She was noted to have some minimal nausea on the day of discharge along with headache, but this is improved after symptomatic management and she is eager for discharge.  She has continued to have some fevers overnight, but is otherwise tolerating oral intake and can be discharged on Duricef as prescribed for 7 more days.  No other acute events or concerns noted.  Discharge Diagnoses:  Principal Problem:   Pyelonephritis Active Problems:   Sepsis (HCC)   Anxiety and depression   Class 2 obesity   GERD (gastroesophageal reflux disease)   Attention deficit disorder (ADD) without hyperactivity   Essential hypertension, benign  Principal discharge diagnosis: Sepsis,  POA, secondary to pyelonephritis with E. coli.  Discharge Instructions  Discharge Instructions     Increase activity slowly   Complete by: As directed       Allergies as of 04/25/2024       Reactions   Fentanyl  Other (See Comments)   Doesn't like the way it feels    Penicillins    Does not work for patient    Prednisone     Makes her feel bad, side effects        Medication List     TAKE these medications    albuterol  108 (90 Base) MCG/ACT inhaler Commonly known as: VENTOLIN  HFA Inhale 2 puffs into the lungs every 4 (four) hours as needed for wheezing or shortness of breath.   budesonide -formoterol  80-4.5 MCG/ACT inhaler Commonly known as: Symbicort  Inhale 2 puffs into the lungs 2 (two) times daily. For cough and wheezing   butalbital -acetaminophen -caffeine  50-325-40 MG tablet Commonly known as: FIORICET  Take 1 tablet by mouth every 4 (four) hours as needed for headache or migraine.   cefadroxil  500 MG capsule Commonly known as: DURICEF Take 1 capsule (500 mg total) by mouth 2 (two) times daily for 7 days.   clonazePAM  0.5 MG tablet Commonly known as: KLONOPIN  TAKE 1/2 TO 1 TABLET BY MOUTH TWICE DAILY FOR EXTREME ANXIETY   dexlansoprazole  60 MG capsule Commonly known as: Dexilant  Take 1 capsule (60 mg total) by mouth daily. For acid reflux   lisinopril -hydrochlorothiazide  20-25 MG tablet Commonly known as: ZESTORETIC  Take 1 tablet by mouth daily.   ondansetron  4 MG disintegrating tablet Commonly known as: ZOFRAN -ODT Take 1 tablet (4 mg total) by mouth every 8 (eight) hours as needed for  nausea.   Systane 0.4-0.3 % Soln Generic drug: Polyethyl Glycol-Propyl Glycol Apply 1-2 drops to eye daily as needed (for dry eye relief).        Follow-up Information     Luking, Glendia LABOR, MD. Schedule an appointment as soon as possible for a visit in 1 week(s).   Specialty: Family Medicine Contact information: [address] [place]  [address] [phone]                Allergies[1]  Consultations: None   Procedures/Studies: CT ABDOMEN PELVIS W CONTRAST Result Date: 04/23/2024 CLINICAL DATA:  Abdominal pain. EXAM: CT ABDOMEN AND PELVIS WITH CONTRAST TECHNIQUE: Multidetector CT imaging of the abdomen and pelvis was performed using the standard protocol following bolus administration of intravenous contrast. RADIATION DOSE REDUCTION: This exam was performed according to the departmental dose-optimization program which includes automated exposure control, adjustment of the mA and/or kV according to patient size and/or use of iterative reconstruction technique. CONTRAST:  OMNIPAQUE  IOHEXOL  300 MG/ML  SOLN COMPARISON:  CT abdomen pelvis with 08/13/2019. FINDINGS: Lower chest: The visualized lung bases are clear. No intra-abdominal free air or free fluid. Hepatobiliary: The liver is unremarkable. No biliary dilatation. Cholecystectomy. Pancreas: Unremarkable. No pancreatic ductal dilatation or surrounding inflammatory changes. Spleen: Normal in size without focal abnormality. Adrenals/Urinary Tract: The adrenal glands unremarkable. There is no hydronephrosis. Heterogeneous bilateral nephrograms, left greater than right, consistent with bilateral pyelonephritis. No abscess. The urinary bladder is unremarkable. Stomach/Bowel: There is sigmoid diverticulosis. There is no bowel obstruction or active inflammation. The appendix is normal. Vascular/Lymphatic: The abdominal aorta and IVC unremarkable. No portal venous gas. There is no adenopathy. Reproductive: Hysterectomy.  No suspicious adnexal masses. Other: None Musculoskeletal: Osteopenia.  No acute osseous pathology. IMPRESSION: 1. Bilateral pyelonephritis. No abscess. 2. Sigmoid diverticulosis. No bowel obstruction. Normal appendix. Electronically Signed   By: Vanetta Chou M.D.   On: 04/23/2024 16:12     Discharge Exam: Vitals:   04/24/24 1957 04/25/24 0439  BP:  127/81 128/73  Pulse: 94 75  Resp: 18 18  Temp: (!) 102.9 F (39.4 C) 100.1 F (37.8 C)  SpO2: 92% 97%   Vitals:   04/24/24 1358 04/24/24 1957 04/25/24 0439 04/25/24 0800  BP: 131/86 127/81 128/73   Pulse: 84 94 75   Resp: 16 18 18    Temp: 98.7 F (37.1 C) (!) 102.9 F (39.4 C) 100.1 F (37.8 C)   TempSrc: Oral Oral Oral   SpO2: 100% 92% 97%   Weight:    109.3 kg  Height:        General: Pt is alert, awake, not in acute distress Cardiovascular: RRR, S1/S2 +, no rubs, no gallops Respiratory: CTA bilaterally, no wheezing, no rhonchi Abdominal: Soft, NT, ND, bowel sounds + Extremities: no edema, no cyanosis    The results of significant diagnostics from this hospitalization (including imaging, microbiology, ancillary and laboratory) are listed below for reference.     Microbiology: Recent Results (from the past 240 hours)  Urine Culture     Status: Abnormal   Collection Time: 04/23/24  4:48 PM   Specimen: Urine, Clean Catch  Result Value Ref Range Status   Specimen Description   Final    URINE, CLEAN CATCH Performed at [facility], [address], [place], [place] [address]    Special Requests   Final    Normal Performed at [facility], [address] [place], [place] [address]    Culture >=100,000 COLONIES/mL ESCHERICHIA COLI (  A)  Final   Report Status 04/25/2024 FINAL  Final   Organism ID, Bacteria ESCHERICHIA COLI (A)  Final      Susceptibility   Escherichia coli - MIC*    AMPICILLIN >=32 RESISTANT Resistant     CEFAZOLIN  (URINE) Value in next row Sensitive      2 SENSITIVEThis is a modified FDA-approved test that has been validated and its performance characteristics determined by the reporting laboratory.  This laboratory is certified under the Clinical Laboratory Improvement Amendments CLIA as qualified to perform high complexity clinical laboratory testing.    CEFEPIME Value in next row Sensitive      2 SENSITIVEThis is a modified FDA-approved test that  has been validated and its performance characteristics determined by the reporting laboratory.  This laboratory is certified under the Clinical Laboratory Improvement Amendments CLIA as qualified to perform high complexity clinical laboratory testing.    ERTAPENEM Value in next row Sensitive      2 SENSITIVEThis is a modified FDA-approved test that has been validated and its performance characteristics determined by the reporting laboratory.  This laboratory is certified under the Clinical Laboratory Improvement Amendments CLIA as qualified to perform high complexity clinical laboratory testing.    CEFTRIAXONE  Value in next row Sensitive      2 SENSITIVEThis is a modified FDA-approved test that has been validated and its performance characteristics determined by the reporting laboratory.  This laboratory is certified under the Clinical Laboratory Improvement Amendments CLIA as qualified to perform high complexity clinical laboratory testing.    CIPROFLOXACIN  Value in next row Sensitive      2 SENSITIVEThis is a modified FDA-approved test that has been validated and its performance characteristics determined by the reporting laboratory.  This laboratory is certified under the Clinical Laboratory Improvement Amendments CLIA as qualified to perform high complexity clinical laboratory testing.    GENTAMICIN Value in next row Sensitive      2 SENSITIVEThis is a modified FDA-approved test that has been validated and its performance characteristics determined by the reporting laboratory.  This laboratory is certified under the Clinical Laboratory Improvement Amendments CLIA as qualified to perform high complexity clinical laboratory testing.    NITROFURANTOIN  Value in next row Sensitive      2 SENSITIVEThis is a modified FDA-approved test that has been validated and its performance characteristics determined by the reporting laboratory.  This laboratory is certified under the Clinical Laboratory Improvement  Amendments CLIA as qualified to perform high complexity clinical laboratory testing.    TRIMETH /SULFA  Value in next row Resistant      2 SENSITIVEThis is a modified FDA-approved test that has been validated and its performance characteristics determined by the reporting laboratory.  This laboratory is certified under the Clinical Laboratory Improvement Amendments CLIA as qualified to perform high complexity clinical laboratory testing.    AMPICILLIN/SULBACTAM Value in next row Intermediate      2 SENSITIVEThis is a modified FDA-approved test that has been validated and its performance characteristics determined by the reporting laboratory.  This laboratory is certified under the Clinical Laboratory Improvement Amendments CLIA as qualified to perform high complexity clinical laboratory testing.    PIP/TAZO Value in next row Sensitive      <=4 SENSITIVEThis is a modified FDA-approved test that has been validated and its performance characteristics determined by the reporting laboratory.  This laboratory is certified under the Clinical Laboratory Improvement Amendments CLIA as qualified to perform high complexity clinical laboratory testing.    MEROPENEM Value in  next row Sensitive      <=4 SENSITIVEThis is a modified FDA-approved test that has been validated and its performance characteristics determined by the reporting laboratory.  This laboratory is certified under the Clinical Laboratory Improvement Amendments CLIA as qualified to perform high complexity clinical laboratory testing.    * >=100,000 COLONIES/mL ESCHERICHIA COLI  Blood culture (routine x 2)     Status: None (Preliminary result)   Collection Time: 04/23/24  6:18 PM   Specimen: BLOOD LEFT FOREARM  Result Value Ref Range Status   Specimen Description   Final    BLOOD LEFT FOREARM BOTTLES DRAWN AEROBIC AND ANAEROBIC   Special Requests Blood Culture adequate volume  Final   Culture   Final    NO GROWTH < 24 HOURS Performed at [facility], [address] [place], [place] [address]    Report Status PENDING  Incomplete  Blood culture (routine x 2)     Status: None (Preliminary result)   Collection Time: 04/23/24  6:18 PM   Specimen: BLOOD LEFT HAND  Result Value Ref Range Status   Specimen Description   Final    BLOOD LEFT HAND BOTTLES DRAWN AEROBIC AND ANAEROBIC   Special Requests   Final    Blood Culture results may not be optimal due to an inadequate volume of blood received in culture bottles   Culture   Final    NO GROWTH < 24 HOURS Performed at [facility], [address], [place], [place] [address]    Report Status PENDING  Incomplete     Labs: BNP (last 3 results) No results for input(s): BNP in the last 8760 hours. Basic Metabolic Panel: Recent Labs  Lab 04/23/24 1231 04/24/24 0450 04/25/24 0435  NA 139 137 139  K 3.6 3.9 4.2  CL 102 104 104  CO2 19* 19* 27  GLUCOSE 147* 111* 113*  BUN 11 11 8   CREATININE 0.87 0.72 0.85  CALCIUM  9.3 8.0* 8.4*   Liver Function Tests: Recent Labs  Lab 04/23/24 1231 04/24/24 0450  AST 60* 33  ALT 129* 81*  ALKPHOS 180* 132*  BILITOT 2.7* 1.9*  PROT 8.0 6.2*  ALBUMIN 4.3 3.4*   Recent Labs  Lab 04/23/24 1231  LIPASE <10*   No results for input(s): AMMONIA in the last 168 hours. CBC: Recent Labs  Lab 04/23/24 1231 04/24/24 0450 04/25/24 0435  WBC 12.2* 10.2 6.7  NEUTROABS 10.1*  --   --   HGB 12.8 10.7* 10.6*  HCT 38.5 31.3* 31.6*  MCV 90.4 91.5 91.9  PLT 243 194 213   Cardiac Enzymes: No results for input(s): CKTOTAL, CKMB, CKMBINDEX, TROPONINI in the last 168 hours. BNP: Invalid input(s): POCBNP CBG: No results for input(s): GLUCAP in the last 168 hours. D-Dimer No results for input(s): DDIMER in the last 72 hours. Hgb A1c No results for input(s): HGBA1C in the last 72 hours. Lipid Profile No results for input(s): CHOL, HDL, LDLCALC, TRIG, CHOLHDL, LDLDIRECT in the last 72 hours. Thyroid  function  studies No results for input(s): TSH, T4TOTAL, T3FREE, THYROIDAB in the last 72 hours.  Invalid input(s): FREET3 Anemia work up No results for input(s): VITAMINB12, FOLATE, FERRITIN, TIBC, IRON, RETICCTPCT in the last 72 hours. Urinalysis    Component Value Date/Time   COLORURINE AMBER (A) 04/23/2024 1648   APPEARANCEUR CLEAR 04/23/2024 1648   LABSPEC 1.043 (H) 04/23/2024 1648   PHURINE 6.0 04/23/2024 1648   GLUCOSEU NEGATIVE 04/23/2024 1648   HGBUR MODERATE (A) 04/23/2024 1648  BILIRUBINUR NEGATIVE 04/23/2024 1648   BILIRUBINUR negative 07/29/2023 1149   BILIRUBINUR Negative 05/26/2021 1652   KETONESUR NEGATIVE 04/23/2024 1648   PROTEINUR 30 (A) 04/23/2024 1648   UROBILINOGEN 0.2 07/29/2023 1149   NITRITE NEGATIVE 04/23/2024 1648   LEUKOCYTESUR MODERATE (A) 04/23/2024 1648   Sepsis Labs Recent Labs  Lab 04/23/24 1231 04/24/24 0450 04/25/24 0435  WBC 12.2* 10.2 6.7   Microbiology Recent Results (from the past 240 hours)  Urine Culture     Status: Abnormal   Collection Time: 04/23/24  4:48 PM   Specimen: Urine, Clean Catch  Result Value Ref Range Status   Specimen Description   Final    URINE, CLEAN CATCH Performed at [facility], [address], [place], [place] [address]    Special Requests   Final    Normal Performed at [facility], [address], [place], [place] [address]    Culture >=100,000 COLONIES/mL ESCHERICHIA COLI (A)  Final   Report Status 04/25/2024 FINAL  Final   Organism ID, Bacteria ESCHERICHIA COLI (A)  Final      Susceptibility   Escherichia coli - MIC*    AMPICILLIN >=32 RESISTANT Resistant     CEFAZOLIN  (URINE) Value in next row Sensitive      2 SENSITIVEThis is a modified FDA-approved test that has been validated and its performance characteristics determined by the reporting laboratory.  This laboratory is certified under the Clinical Laboratory Improvement Amendments CLIA as qualified to perform high complexity  clinical laboratory testing.    CEFEPIME Value in next row Sensitive      2 SENSITIVEThis is a modified FDA-approved test that has been validated and its performance characteristics determined by the reporting laboratory.  This laboratory is certified under the Clinical Laboratory Improvement Amendments CLIA as qualified to perform high complexity clinical laboratory testing.    ERTAPENEM Value in next row Sensitive      2 SENSITIVEThis is a modified FDA-approved test that has been validated and its performance characteristics determined by the reporting laboratory.  This laboratory is certified under the Clinical Laboratory Improvement Amendments CLIA as qualified to perform high complexity clinical laboratory testing.    CEFTRIAXONE  Value in next row Sensitive      2 SENSITIVEThis is a modified FDA-approved test that has been validated and its performance characteristics determined by the reporting laboratory.  This laboratory is certified under the Clinical Laboratory Improvement Amendments CLIA as qualified to perform high complexity clinical laboratory testing.    CIPROFLOXACIN  Value in next row Sensitive      2 SENSITIVEThis is a modified FDA-approved test that has been validated and its performance characteristics determined by the reporting laboratory.  This laboratory is certified under the Clinical Laboratory Improvement Amendments CLIA as qualified to perform high complexity clinical laboratory testing.    GENTAMICIN Value in next row Sensitive      2 SENSITIVEThis is a modified FDA-approved test that has been validated and its performance characteristics determined by the reporting laboratory.  This laboratory is certified under the Clinical Laboratory Improvement Amendments CLIA as qualified to perform high complexity clinical laboratory testing.    NITROFURANTOIN  Value in next row Sensitive      2 SENSITIVEThis is a modified FDA-approved test that has been validated and its performance  characteristics determined by the reporting laboratory.  This laboratory is certified under the Clinical Laboratory Improvement Amendments CLIA as qualified to perform high complexity clinical laboratory testing.    TRIMETH /SULFA  Value in next row Resistant  2 SENSITIVEThis is a modified FDA-approved test that has been validated and its performance characteristics determined by the reporting laboratory.  This laboratory is certified under the Clinical Laboratory Improvement Amendments CLIA as qualified to perform high complexity clinical laboratory testing.    AMPICILLIN/SULBACTAM Value in next row Intermediate      2 SENSITIVEThis is a modified FDA-approved test that has been validated and its performance characteristics determined by the reporting laboratory.  This laboratory is certified under the Clinical Laboratory Improvement Amendments CLIA as qualified to perform high complexity clinical laboratory testing.    PIP/TAZO Value in next row Sensitive      <=4 SENSITIVEThis is a modified FDA-approved test that has been validated and its performance characteristics determined by the reporting laboratory.  This laboratory is certified under the Clinical Laboratory Improvement Amendments CLIA as qualified to perform high complexity clinical laboratory testing.    MEROPENEM Value in next row Sensitive      <=4 SENSITIVEThis is a modified FDA-approved test that has been validated and its performance characteristics determined by the reporting laboratory.  This laboratory is certified under the Clinical Laboratory Improvement Amendments CLIA as qualified to perform high complexity clinical laboratory testing.    * >=100,000 COLONIES/mL ESCHERICHIA COLI  Blood culture (routine x 2)     Status: None (Preliminary result)   Collection Time: 04/23/24  6:18 PM   Specimen: BLOOD LEFT FOREARM  Result Value Ref Range Status   Specimen Description   Final    BLOOD LEFT FOREARM BOTTLES DRAWN AEROBIC AND  ANAEROBIC   Special Requests Blood Culture adequate volume  Final   Culture   Final    NO GROWTH < 24 HOURS Performed at [facility], [address] [place], [place] [address]    Report Status PENDING  Incomplete  Blood culture (routine x 2)     Status: None (Preliminary result)   Collection Time: 04/23/24  6:18 PM   Specimen: BLOOD LEFT HAND  Result Value Ref Range Status   Specimen Description   Final    BLOOD LEFT HAND BOTTLES DRAWN AEROBIC AND ANAEROBIC   Special Requests   Final    Blood Culture results may not be optimal due to an inadequate volume of blood received in culture bottles   Culture   Final    NO GROWTH < 24 HOURS Performed at [facility], [address], [place], [place] [address]    Report Status PENDING  Incomplete     Time coordinating discharge: 35 minutes  SIGNED:   Adron JONETTA Fairly, DO [facility] 04/25/2024, 12:00 PM  If 7PM-7AM, please contact night-coverage [email or web address]     [1]  Allergies Allergen Reactions   Fentanyl  Other (See Comments)    Doesn't like the way it feels    Penicillins     Does not work for patient    Prednisone      Makes her feel bad, side effects

## 2024-04-29 LAB — CULTURE, BLOOD (ROUTINE X 2)
Culture: NO GROWTH
Culture: NO GROWTH
Special Requests: ADEQUATE

## 2024-05-02 ENCOUNTER — Other Ambulatory Visit: Payer: Self-pay | Admitting: Nurse Practitioner

## 2024-05-02 MED ORDER — FLUCONAZOLE 150 MG PO TABS: ORAL_TABLET | ORAL | 0 refills | Status: AC

## 2024-05-09 ENCOUNTER — Ambulatory Visit: Payer: Self-pay

## 2024-05-09 NOTE — Telephone Encounter (Signed)
 FYI Only or Action Required?: FYI only for provider: appointment scheduled on 05/17/24.  Patient was last seen in primary care on 04/23/2024 by Mauro Elveria BROCKS, NP.  Called Nurse Triage reporting Headache and Abdominal Pain.  Symptoms began several weeks ago.  Interventions attempted: Nothing.  Symptoms are: gradually worsening.  Triage Disposition: See PCP Within 2 Weeks (overriding See HCP Within 4 Hours (Or PCP Triage))  Patient/caregiver understands and will follow disposition?: Yes  Reason for Disposition  [1] MILD-MODERATE pain AND [2] constant AND [3] present > 2 hours  Answer Assessment - Initial Assessment Questions Patient states that she was recently hospitalized for similar symptoms that were better, but have worsened over the last 2 days. Office visit advised, no sooner appts available with PCP. Patient states that she does not want to go to [facility] or ED. Advised to call back if symptoms worsen.   1. LOCATION: Where does it hurt?      Left lower abdomen and back  2. RADIATION: Does the pain shoot anywhere else? (e.g., chest, back)     Back  3. ONSET: When did the pain begin? (e.g., minutes, hours or days ago)     Around mid December  4. SUDDEN: Gradual or sudden onset?     Sudden  5. PATTERN Does the pain come and go, or is it constant?     Constant  6. SEVERITY: How bad is the pain?  (e.g., Scale 1-10; mild, moderate, or severe)     3-4/10  7. RECURRENT SYMPTOM: Have you ever had this type of stomach pain before? If Yes, ask: When was the last time? and What happened that time?      Yes, a couple of weeks ago and was hospitalized for pylenephritis  8. CAUSE: What do you think is causing the stomach pain? (e.g., gallstones, recent abdominal surgery)     Kidney issues  9. RELIEVING/AGGRAVATING FACTORS: What makes it better or worse? (e.g., antacids, bending or twisting motion, bowel movement)     No relieving factors  10. OTHER  SYMPTOMS: Do you have any other symptoms? (e.g., back pain, diarrhea, fever, urination pain, vomiting)       Back pain, headaches  11. PREGNANCY: Is there any chance you are pregnant? When was your last menstrual period?       NA  Protocols used: Abdominal Pain - [facility]  Copied from CRM #[id number]. Topic: Clinical - Red Word Triage >> May 09, 2024  4:30 PM Wess RAMAN wrote: Red Word that prompted transfer to Nurse Triage: Still having headaches and stomach pain  Need hospital follow up. Discharged 04/25/24

## 2024-05-17 ENCOUNTER — Ambulatory Visit (INDEPENDENT_AMBULATORY_CARE_PROVIDER_SITE_OTHER): Admitting: Nurse Practitioner

## 2024-05-17 VITALS — BP 172/102 | HR 73 | Temp 98.1°F | Wt 240.2 lb

## 2024-05-17 DIAGNOSIS — I10 Essential (primary) hypertension: Secondary | ICD-10-CM | POA: Diagnosis not present

## 2024-05-17 DIAGNOSIS — N816 Rectocele: Secondary | ICD-10-CM | POA: Diagnosis not present

## 2024-05-17 DIAGNOSIS — K5909 Other constipation: Secondary | ICD-10-CM

## 2024-05-17 DIAGNOSIS — G43E01 Chronic migraine with aura, not intractable, with status migrainosus: Secondary | ICD-10-CM | POA: Diagnosis not present

## 2024-05-17 DIAGNOSIS — N390 Urinary tract infection, site not specified: Secondary | ICD-10-CM

## 2024-05-17 LAB — POCT URINALYSIS DIP (CLINITEK)
Bilirubin, UA: NEGATIVE
Blood, UA: NEGATIVE
Glucose, UA: NEGATIVE mg/dL
Ketones, POC UA: NEGATIVE mg/dL
Leukocytes, UA: NEGATIVE
Nitrite, UA: NEGATIVE
POC PROTEIN,UA: NEGATIVE
Spec Grav, UA: <=1.005 — AB
Urobilinogen, UA: 0.2 U/dL
pH, UA: 7

## 2024-05-17 MED ORDER — AMLODIPINE BESYLATE 5 MG PO TABS: 5.0000 mg | ORAL_TABLET | Freq: Every day | ORAL | 0 refills | Status: DC

## 2024-05-18 ENCOUNTER — Encounter: Payer: Self-pay | Admitting: Nurse Practitioner

## 2024-05-18 NOTE — Progress Notes (Signed)
 "  Subjective:    Patient ID: Theresa [name], female    DOB: 17-Nov-1970, 54 y.o.   MRN: [id number]  HPI Discussed the use of AI scribe software for clinical note transcription with the patient, who gave verbal consent to proceed.  History of Present Illness Theresa [name] is a 54 year old female with IBS, GERD, and recurrent UTIs who presents with bowel and bladder issues.  She has ongoing issues with IBS with constipation, GERD, and diverticulum disease. She has a history of diverticulosis and has undergone multiple colonoscopies, which revealed diverticulum disease and two precancerous polyps. She is not due for another colonoscopy for several years. She has tried various treatments for constipation, including magnesium citrate, Colace, enemas, and Linzess , but these have not been effective. She experiences severe constipation, sometimes requiring manual removal of stool, and has tried natural remedies like tea and mocha coffee, which provide some relief.  She has been diagnosed with a rectocele, which exacerbates her constipation. She describes episodes of severe constipation where she has to spend extended periods on the toilet. Linzess  was ineffective, as it only caused stomach growling. She continues to explore natural remedies for relief.  She has a history of recurrent urinary tract infections (UTIs), experiencing three to four UTIs in the past year. She reports urgency and a change in urine smell but denies burning or pain. She was previously hospitalized and reports that she was septic at that time.  She experiences migraines, with a recent severe episode characterized by left-sided pain, photophobia, phonophobia, and vomiting. She has a history of migraines with occasional flare up. She has tried various medications, including Fioricet , which she finds effective but uses sparingly. She also finds relief with caffeine -containing products.  Her blood pressure has been elevated,  and she monitors it regularly. She takes her blood pressure medication daily.  She maintains a healthy diet and lifestyle. Past Surgical History:  Procedure Laterality Date   ABDOMINAL HYSTERECTOMY     still has right ovary   BIOPSY N/A 03/05/2014   Procedure: DUODENAL AND GASTRIC BIOPSY;  Surgeon: Margo LITTIE Haddock, MD;  Location: [facility];  Service: Endoscopy;  Laterality: N/A;   bladder sling X 3     CHILECTOMY Left 03/12/2020   Procedure: CHILECTOMY HALLUX;  Surgeon: Tobie Buckles, DPM;  Location: [facility];  Service: Podiatry;  Laterality: Left;   CHOLECYSTECTOMY     COLONOSCOPY  04/24/2012   DOQ:Dzddpoz polyp measuring 5 mm in size was found in the proximal transverse colon; polypectomy was performed with cold forceps Pedunculated polyp measuring 1.1 cm in size was found in the sigmoid colon; polypectomy was performed using snare cautery Moderate diverticulosis was noted in the descending colon and sigmoid colon small internal hemorrhoids. next tcs 04/2017   COLONOSCOPY WITH PROPOFOL  N/A 10/23/2019   sigmoid and descending colon diverticulosis, one 3 mm polyp in cecum, one 4 mm polyp in descending colon. Hyperplastic   ESOPHAGOGASTRODUODENOSCOPY N/A 02/18/2014   Procedure: ESOPHAGOGASTRODUODENOSCOPY (EGD);  Surgeon: Margo LITTIE Haddock, MD;  Location: [facility];  Service: Endoscopy;  Laterality: N/A;  115   ESOPHAGOGASTRODUODENOSCOPY (EGD) WITH PROPOFOL  N/A 03/05/2014   SLF: 1. dyspepsia due to constipation, gastritis, and GERD 2. Multiple Gastric polyps 3. Moderate non-erosive gastritis 4. Diverticulum in the 2nd part of the duodenum   ESOPHAGOGASTRODUODENOSCOPY (EGD) WITH PROPOFOL  N/A 10/23/2019   normal esophagus s/p dilation, normal stomach, normal duodenum.   MALONEY DILATION N/A 10/23/2019   Procedure: MALONEY DILATION;  Surgeon: Shaaron,  Lamar HERO, MD;  Location: [facility];  Service: Endoscopy;  Laterality: N/A;   POLYPECTOMY  10/23/2019   Procedure: POLYPECTOMY;  Surgeon: Shaaron Lamar HERO, MD;  Location: [facility];  Service: Endoscopy;;   TUBAL LIGATION     No new sexual partners. Denies concerns about STIs.  Review of Systems  Constitutional:  Positive for fatigue. Negative for fever.  Respiratory:  Negative for cough, chest tightness and shortness of breath.   Cardiovascular:  Negative for chest pain.  Gastrointestinal:  Positive for abdominal pain and constipation. Negative for blood in stool, nausea and vomiting.  Genitourinary:  Negative for dysuria, enuresis, frequency and urgency.  Neurological:  Positive for headaches.   Social History[1]      Objective:   Physical Exam NAD.  Alert, oriented.  Mildly fatigued in appearance.  Making good eye contact.  Speech clear.  Lungs clear.  Heart regular rate rhythm.  Abdomen soft nondistended with minimal lower abdominal tenderness.  No rebound or guarding.  No obvious masses. Today's Vitals   05/17/24 1430 05/17/24 1500  BP: (!) 161/106 (!) 172/102  Pulse: 73   Temp: 98.1 F (36.7 C)   SpO2: 100%    There is no height or weight on file to calculate BMI.  Results for orders placed or performed in visit on 05/17/24  POCT URINALYSIS DIP (CLINITEK)   Collection Time: 05/17/24  3:27 PM  Result Value Ref Range   Color, UA light yellow (A) yellow   Clarity, UA hazy (A) clear   Glucose, UA negative negative mg/dL   Bilirubin, UA negative negative   Ketones, POC UA negative negative mg/dL   Spec Grav, UA <=8.994 (A) 1.010 - 1.025   Blood, UA negative negative   pH, UA 7.0 5.0 - 8.0   POC PROTEIN,UA negative negative, trace   Urobilinogen, UA 0.2 0.2 or 1.0 E.U./dL   Nitrite, UA Negative Negative   Leukocytes, UA Negative Negative          Assessment & Plan:  1. Essential hypertension (Primary) Blood pressure management ongoing with Lisinopril . Recent labs show normal kidney function and EGFR. Blood pressure readings consistent with previous measurements. - Continue Lisinopril  for blood pressure  management. - Add Amlodipine  as directed.  - Monitor blood pressure regularly and bring log to next visit.  - amLODipine  (NORVASC ) 5 MG tablet; Take 1 tablet (5 mg total) by mouth daily.  Dispense: 90 tablet; Refill: 0  2. Chronic constipation Chronic constipation with IBS complicated by rectocele and diverticular disease. Linzess  was ineffective. Rectocele may exacerbate constipation. Current management includes natural remedies. Manual stool removal required. Referral to GI specialist necessary for further evaluation. - Referred to GI specialist at [facility] for evaluation of chronic constipation and rectocele. - Continue natural remedies like tea for constipation relief. - Consider Miralax  for bowel cleansing as needed. - Discuss potential rectocele repair with GI specialist. - Ambulatory referral to Gastroenterology  3. Recurrent UTI Recurrent UTIs with 3-4 episodes in 2024. No current urinary symptoms except urgency and odor. Previous UTIs led to sepsis. Urine sample obtained for analysis. - Obtained urine sample for analysis to check for current UTI. - Monitor for urinary symptoms and report any changes. - States she saw urology years ago but no recent follow up. May need referral depending on further UTIs - POCT URINALYSIS DIP (CLINITEK)  4. Rectocele  - Ambulatory referral to Gastroenterology  5. Chronic migraine with aura and with status migrainosus, not intractable - Consider  Excedrin Migraine as an alternative to Fioricet  for migraine management since caffeine  seems to help. - Defers daily medication today. Will focus on BP and revisit at next visit. Recheck if any new symptoms.  Return in about 2 weeks (around 05/31/2024).        [1]  Social History Tobacco Use   Smoking status: Never   Smokeless tobacco: Never  Vaping Use   Vaping status: Never Used  Substance Use Topics   Alcohol use: Yes    Comment: occas   Drug use: No   "

## 2024-05-25 ENCOUNTER — Ambulatory Visit: Admitting: Cardiology

## 2024-05-25 NOTE — Progress Notes (Unsigned)
 " Cardiology Office Note   Date:  05/25/2024  ID:  Theresa [name], DOB 05/11/1970, MRN [id number] PCP: Alphonsa Glendia LABOR, MD  [facility] Providers Cardiologist:  Alvan Carrier, MD Cardiology APP:  Gerard Frederick, NP { Click to update primary MD,subspecialty MD or APP then REFRESH:1}    History of Present Illness Theresa [name] is a 54 y.o. female with a past medical history of chronic chest pain, fibromyalgia, OCD, depression/anxiety, seizures, GERD, hypertension, migraine headaches, obesity, who presents today for follow-up after recent hospitalization.   Patient has a long history of atypical chest pain, possibly from anxiety or stress or GERD.  Treadmill test was ordered in 2020, but never performed.  Heart monitor in 2021 for palpitations showed normal sinus rhythm, rare PACs and PVCs, brief atrial runs.  No sustained arrhythmias.  Echo showed EF 65 to 70%, normal RV SF, trivial MR.  Patient was subsequently lost to follow-up.   Patient was admitted in September with chest pain with atypical and typical features.  High-sensitivity troponin was elevated with a flat trend.  Lexiscan  showed no ischemia.  Echo showed EF of 60 to 65% with no wall motion abnormality.  Medication management was continued.   Coronary CTA completed with a coronary calcium  score of 0. There was mild ascending aorta dilatation of 40 mm in diameter. Then with a concern fr palpitations she was placed on Zio XT monitor. Monitor showed an average heart rate of 74 bpm. 10 episodes of SVT. Rare ectopy. No atrial fib/flutter.   She previously been seen in clinic 10/07/2023.  Continued complaints of chest pressure and shortness of breath.  Blood pressure been elevated.  She was recently started on new medications by her PCP for anxiety and treated for Lyme's disease and just finished of antibiotic therapy.  She was started on low-dose Toprol -XL to assist with suppressing palpitations and to assist with blood  pressure.  She was last seen in clinic 11/16/2023 with continued complaints of fatigue, shortness breath, palpitations weight gain and chest pressure.  Previously prescribed low-dose beta-blocker to help with palpitations which she did not start due to fatigue.  She stated that she talk with a pharmacist pharmacist advised her to likely worsen her symptoms so she did not have the medication filled.  She stated that she had been out of her Symbicort  and had been taking antianxiety and hypertension medications.  She also noted an increase in her diastolic blood pressures.  She was recently hospitalized at [facility] on 12/22 - 04/21/2024 with left lower abdominal pain urinary frequency and was subsequently admitted and treated for pyelonephritis there was no growth on blood cultures that was noted urine cultures were positive for E. coli that appeared to be sensitive to several antibiotics.  She was discharged on Duricef as prescribed for 7 more days.  There were no other acute events or concerns noted.   She returns to clinic today  ROS: 10 point review of systems has been reviewed and considered negative exception was been listed in HPI  Studies Reviewed      Cardiac Studies & Procedures   ______________________________________________________________________________________________   STRESS TESTS  NM MYOCAR MULTI W/SPECT W 01/26/2023  Narrative   The study is normal. There are no perfusion defects. The study is low risk.   No ST deviation was noted.   LV perfusion is normal.   Left ventricular function is normal. Nuclear stress EF: 74%. The left ventricular ejection fraction is hyperdynamic (>65%). End  diastolic cavity size is normal.   ECHOCARDIOGRAM  ECHOCARDIOGRAM COMPLETE 01/25/2023  Narrative ECHOCARDIOGRAM REPORT    [name] Date of Exam: 01/25/2023 Medical Rec #:  [id number]       Height:       65.0 in Accession #:    [id number]      Weight:        196.0 lb Date of Birth:  May 30, 1970       BSA:          1.961 m Patient Age:    52 years        BP:           100/67 mmHg Patient Gender: F               HR:           74 bpm. Exam Location:  [facility]  Procedure: 2D Echo, Cardiac Doppler, Color Doppler and Strain Analysis  Indications:    Chest Pain  History:        Patient has no prior history of Echocardiogram examinations. Signs/Symptoms:Chest Pain, Syncope and Dizziness/Lightheadedness; Risk Factors:Hypertension.  Sonographer:    Naomie Reef Referring Phys: [id number] PRATIK D [facility]   Sonographer Comments: Global longitudinal strain was attempted. IMPRESSIONS   1. Left ventricular ejection fraction, by estimation, is 55 to 60%. The left ventricle has normal function. The left ventricle has no regional wall motion abnormalities. Left ventricular diastolic parameters were normal. The average left ventricular global longitudinal strain is -18.1 %. The global longitudinal strain is normal. 2. Right ventricular systolic function is normal. The right ventricular size is normal. There is normal pulmonary artery systolic pressure. 3. The mitral valve is normal in structure. Trivial mitral valve regurgitation. No evidence of mitral stenosis. 4. The tricuspid valve is abnormal. 5. The aortic valve is tricuspid. Aortic valve regurgitation is not visualized. No aortic stenosis is present. 6. The inferior vena cava is normal in size with greater than 50% respiratory variability, suggesting right atrial pressure of 3 mmHg.  FINDINGS Left Ventricle: Left ventricular ejection fraction, by estimation, is 55 to 60%. The left ventricle has normal function. The left ventricle has no regional wall motion abnormalities. The average left ventricular global longitudinal strain is -18.1 %. The global longitudinal strain is normal. The left ventricular internal cavity size was normal in size. There is no left ventricular hypertrophy. Left ventricular  diastolic parameters were normal.  Right Ventricle: The right ventricular size is normal. Right vetricular wall thickness was not well visualized. Right ventricular systolic function is normal. There is normal pulmonary artery systolic pressure. The tricuspid regurgitant velocity is 2.14 m/s, and with an assumed right atrial pressure of 3 mmHg, the estimated right ventricular systolic pressure is 21.3 mmHg.  Left Atrium: Left atrial size was normal in size.  Right Atrium: Right atrial size was normal in size.  Pericardium: There is no evidence of pericardial effusion.  Mitral Valve: The mitral valve is normal in structure. Trivial mitral valve regurgitation. No evidence of mitral valve stenosis. MV peak gradient, 2.4 mmHg. The mean mitral valve gradient is 1.0 mmHg.  Tricuspid Valve: The tricuspid valve is abnormal. Tricuspid valve regurgitation is mild . No evidence of tricuspid stenosis.  Aortic Valve: The aortic valve is tricuspid. Aortic valve regurgitation is not visualized. No aortic stenosis is present. Aortic valve mean gradient measures 5.0 mmHg. Aortic valve peak gradient measures 9.7 mmHg. Aortic valve area, by VTI measures 2.72 cm.  Pulmonic Valve: The pulmonic valve was not well visualized. Pulmonic valve regurgitation is not visualized. No evidence of pulmonic stenosis.  Aorta: The aortic root is normal in size and structure.  Venous: The inferior vena cava is normal in size with greater than 50% respiratory variability, suggesting right atrial pressure of 3 mmHg.  IAS/Shunts: No atrial level shunt detected by color flow Doppler.   LEFT VENTRICLE PLAX 2D LVIDd:         4.00 cm   Diastology LVIDs:         2.80 cm   LV e' medial:    9.57 cm/s LV PW:         1.00 cm   LV E/e' medial:  8.3 LV IVS:        1.00 cm   LV e' lateral:   14.40 cm/s LVOT diam:     2.00 cm   LV E/e' lateral: 5.5 LV SV:         90 LV SV Index:   46        2D Longitudinal Strain LVOT Area:      3.14 cm  2D Strain GLS Avg:     -18.1 %   RIGHT VENTRICLE RV Basal diam:  3.00 cm RV Mid diam:    2.90 cm RV S prime:     11.60 cm/s TAPSE (M-mode): 2.3 cm  LEFT ATRIUM             Index        RIGHT ATRIUM           Index LA diam:        3.70 cm 1.89 cm/m   RA Area:     14.50 cm LA Vol (A2C):   39.0 ml 19.89 ml/m  RA Volume:   31.60 ml  16.11 ml/m LA Vol (A4C):   35.7 ml 18.20 ml/m LA Biplane Vol: 39.8 ml 20.29 ml/m AORTIC VALVE                    PULMONIC VALVE AV Area (Vmax):    2.40 cm     PV Vmax:       0.86 m/s AV Area (Vmean):   2.48 cm     PV Peak grad:  2.9 mmHg AV Area (VTI):     2.72 cm AV Vmax:           156.00 cm/s AV Vmean:          99.800 cm/s AV VTI:            0.329 m AV Peak Grad:      9.7 mmHg AV Mean Grad:      5.0 mmHg LVOT Vmax:         119.00 cm/s LVOT Vmean:        78.700 cm/s LVOT VTI:          0.285 m LVOT/AV VTI ratio: 0.87  AORTA Ao Root diam: 3.30 cm  MITRAL VALVE               TRICUSPID VALVE MV Area (PHT): 3.89 cm    TR Peak grad:   18.3 mmHg MV Area VTI:   4.05 cm    TR Vmax:        214.00 cm/s MV Peak grad:  2.4 mmHg MV Mean grad:  1.0 mmHg    SHUNTS MV Vmax:       0.78 m/s    Systemic VTI:  0.29 m  MV Vmean:      47.0 cm/s   Systemic Diam: 2.00 cm MV Decel Time: 195 msec MV E velocity: 79.20 cm/s MV A velocity: 62.40 cm/s MV E/A ratio:  1.27  Dorn Ross MD Electronically signed by Dorn Ross MD Signature Date/Time: 01/25/2023/10:59:13 AM    Final    MONITORS  LONG TERM MONITOR (3-14 DAYS) 10/11/2023  Narrative   11 day monitor   Rare supraventricular ectopy in the form of isolated PACs, couplets. 10 runs of SVT, longest 14 beats.   Rare ventricular ectopy in the form of isolated PVCs.   Reported symptoms correlated with normal sinus rhythm   Patch Wear Time:  10 days and 14 hours (2025-05-05T23:26:29-0400 to 2025-05-16T13:47:47-0400)  Patient had a min HR of 47 bpm, max HR of 197 bpm, and avg HR of  74 bpm. Predominant underlying rhythm was Sinus Rhythm. 10 Supraventricular Tachycardia runs occurred, the run with the fastest interval lasting 6 beats with a max rate of 197 bpm, the longest lasting 14 beats with an avg rate of 141 bpm. Isolated SVEs were rare (<1.0%), SVE Couplets were rare (<1.0%), and no SVE Triplets were present. Isolated VEs were rare (<1.0%), and no VE Couplets or VE Triplets were present.   CT SCANS  CT CORONARY MORPH W/CTA COR W/SCORE 08/25/2023  Addendum 10/25/2023  5:56 PM ADDENDUM REPORT: 10/25/2023 17:54  EXAM: OVER-READ INTERPRETATION  CT CHEST  The following report is an over-read performed by radiologist Dr. Suzen Dials of [facility] on 10/25/2023. This over-read does not include interpretation of cardiac or coronary anatomy or pathology. The coronary calcium  score/coronary CTA interpretation by the cardiologist is attached.  COMPARISON:  January 18, 2020  FINDINGS: Cardiovascular: There are no significant extracardiac vascular findings.  Mediastinum/Nodes: There are no enlarged lymph nodes within the visualized mediastinum.  Lungs/Pleura: There is no pleural effusion. The visualized lungs appear clear.  Upper abdomen: No significant findings in the visualized upper abdomen.  Musculoskeletal/Chest wall: No chest wall mass or suspicious osseous findings within the visualized chest.  IMPRESSION: No significant extracardiac findings within the visualized chest.   Electronically Signed By: Suzen Dials M.D. On: 10/25/2023 17:54  Narrative CLINICAL DATA:  Chest pain  EXAM: Cardiac/Coronary  CTA  TECHNIQUE: The patient was scanned on a Siemens Somatom go.Top scanner.  : A retrospective scan was triggered in the ascending thoracic aorta. Axial non-contrast 3 mm slices were carried out through the heart. The data set was analyzed on a dedicated work station and scored using the Agatson method. Gantry rotation  speed was 330 msecs and collimation was .6 mm. 100mg  of metoprolol  and 0.8 mg of sl NTG was given. The 3D data set was reconstructed in 5% intervals of the 60-95 % of the R-R cycle. Diastolic phases were analyzed on a dedicated work station using MPR, MIP and VRT modes. The patient received 100 cc of contrast.  FINDINGS: Aorta: Mild ascending aorta dilation, maximal diameter 40 mm. No calcifications. No dissection.  Aortic Valve:  Trileaflet.  No calcifications.  Coronary Arteries:  Normal coronary origin.  Right dominance.  RCA is a dominant artery. There is no plaque.  Left main gives rise to LAD and LCX arteries. LM has no disease.  LAD has no plaque.  LCX is a non-dominant artery.  There is no plaque.  Other findings:  Normal pulmonary vein drainage into the left atrium.  Normal left atrial appendage without a thrombus.  Normal size of the pulmonary artery.  IMPRESSION: 1. Coronary calcium  score of 0.  2. Normal coronary origin with right dominance.  3. No evidence of CAD.  4. CAD-RADS 0. Consider non-atherosclerotic causes of chest pain.  5. Mild ascending aorta dilation, 40 mm in diameter.  Electronically Signed: By: Redell Cave M.D. On: 08/25/2023 17:03     ______________________________________________________________________________________________      Risk Assessment/Calculations   No BP recorded.  {Refresh Note OR Click here to enter BP  :1}***       Physical Exam VS:  There were no vitals taken for this visit.       Wt Readings from Last 3 Encounters:  05/17/24 240 lb 3.2 oz (109 kg)  04/25/24 240 lb 15.4 oz (109.3 kg)  04/23/24 237 lb (107.5 kg)    GEN: Well nourished, well developed in no acute distress NECK: No JVD; No carotid bruits CARDIAC: ***RRR, no murmurs, rubs, gallops RESPIRATORY:  Clear to auscultation without rales, wheezing or rhonchi  ABDOMEN: Soft, non-tender, non-distended EXTREMITIES:  No edema; No deformity    ASSESSMENT AND PLAN Palpitations Mild ascending aortic dilatation Obesity Primary hypertension    {Are you ordering a CV Procedure (e.g. stress test, cath, DCCV, TEE, etc)?   Press F2        :[id number]}  Dispo: ***  Signed, Loring Liskey, NP   "

## 2024-05-31 ENCOUNTER — Ambulatory Visit: Admitting: Nurse Practitioner

## 2024-06-06 ENCOUNTER — Encounter: Payer: Self-pay | Admitting: Cardiology

## 2024-06-06 ENCOUNTER — Telehealth: Payer: Self-pay | Admitting: Nurse Practitioner

## 2024-06-06 ENCOUNTER — Ambulatory Visit: Admitting: Cardiology

## 2024-06-06 ENCOUNTER — Telehealth: Payer: Self-pay

## 2024-06-06 VITALS — BP 116/78 | HR 84 | Ht 65.0 in | Wt 242.8 lb

## 2024-06-06 DIAGNOSIS — I77819 Aortic ectasia, unspecified site: Secondary | ICD-10-CM

## 2024-06-06 DIAGNOSIS — Z6835 Body mass index (BMI) 35.0-35.9, adult: Secondary | ICD-10-CM

## 2024-06-06 DIAGNOSIS — I10 Essential (primary) hypertension: Secondary | ICD-10-CM | POA: Diagnosis not present

## 2024-06-06 DIAGNOSIS — R002 Palpitations: Secondary | ICD-10-CM | POA: Diagnosis not present

## 2024-06-06 DIAGNOSIS — E66812 Obesity, class 2: Secondary | ICD-10-CM

## 2024-06-06 DIAGNOSIS — N390 Urinary tract infection, site not specified: Secondary | ICD-10-CM | POA: Diagnosis not present

## 2024-06-06 DIAGNOSIS — F419 Anxiety disorder, unspecified: Secondary | ICD-10-CM

## 2024-06-06 NOTE — Telephone Encounter (Signed)
 Pt believes she was suppose to receive a referral for urology but have not heard from them nor Gastro . Has referral been put in for her

## 2024-06-06 NOTE — Progress Notes (Addendum)
 " Cardiology Office Note   Date:  06/06/2024  ID:  MIALANI REICKS, DOB April 06, 1971, MRN [id number] PCP: Alphonsa Glendia LABOR, MD  [facility] Providers Cardiologist:  Alvan Carrier, MD Cardiology APP:  Gerard Frederick, NP     History of Present Illness JON KASPAREK is a 54 y.o. female with a past medical history of chronic chest pain, fibromyalgia, OCD, depression/anxiety, seizures, GERD, hypertension, migraine headaches, obesity, who presents today for follow-up after recent hospitalization.   Patient has a long history of atypical chest pain, possibly from anxiety or stress or GERD.  Treadmill test was ordered in 2020, but never performed.  Heart monitor in 2021 for palpitations showed normal sinus rhythm, rare PACs and PVCs, brief atrial runs.  No sustained arrhythmias.  Echo showed EF 65 to 70%, normal RV SF, trivial MR.  Patient was subsequently lost to follow-up.   Patient was admitted in September with chest pain with atypical and typical features.  High-sensitivity troponin was elevated with a flat trend.  Lexiscan  showed no ischemia.  Echo showed EF of 60 to 65% with no wall motion abnormality.  Medication management was continued.   Coronary CTA completed with a coronary calcium  score of 0. There was mild ascending aorta dilatation of 40 mm in diameter. Then with a concern fr palpitations she was placed on Zio XT monitor. Monitor showed an average heart rate of 74 bpm. 10 episodes of SVT. Rare ectopy. No atrial fib/flutter.   She previously been seen in clinic 10/07/2023.  Continued complaints of chest pressure and shortness of breath.  Blood pressure been elevated.  She was recently started on new medications by her PCP for anxiety and treated for Lyme's disease and just finished of antibiotic therapy.  She was started on low-dose Toprol -XL to assist with suppressing palpitations and to assist with blood pressure.  She was last seen in clinic 11/16/2023 with continued complaints of  fatigue, shortness breath, palpitations weight gain and chest pressure.  Previously prescribed low-dose beta-blocker to help with palpitations which she did not start due to fatigue.  She stated that she talk with a pharmacist pharmacist advised her to likely worsen her symptoms so she did not have the medication filled.  She stated that she had been out of her Symbicort  and had been taking antianxiety and hypertension medications.  She also noted an increase in her diastolic blood pressures.  She was recently hospitalized at [facility] on 12/22 - 04/21/2024 with left lower abdominal pain urinary frequency and was subsequently admitted and treated for pyelonephritis there was no growth on blood cultures that was noted urine cultures were positive for E. coli that appeared to be sensitive to several antibiotics.  She was discharged on Duricef as prescribed for 7 more days.  There were no other acute events or concerns noted.   She returns to clinic today for follow-up for intermittent chest pain and palpitations, hypertension, and mild ascending aortic dilation. Pt states that palpitations and chest pain have been improving with stress control techniques, pt has noticed flare ups are associated with stress. Pt continues to have symptoms that wake her up at night, has noticed some relief with use of home anxiety medications. Pt was recently hospitalized in December '25 for diverticulitis and sepsis. Pt's BP was elevated at hospital follow-up appointment with PCP and was prescribed amlodipine  but pt has not been taking it. Pt states BP has been well-controlled at home except during chest pain episodes associated with stress  where it has been 150s/90s-100s. Pt's BP 116/78 in clinic today. Pt is concerned her recent symptoms of elevated blood pressures, chest pain, palpitations, and weight gain are associated with high cortisol levels and would like to have her levels checked. Pt also states she has been  unable to fill her clonazepam  since a pharmacy change and has been unable to get a hold of her PCP. Pt also states her PCP had stated they put in a referral for Urology in [facility], but when she called the urologist they were unable to find the referral.   ROS: 10 point review of systems has been reviewed and considered negative exception was been listed in HPI  Studies Reviewed EKG Interpretation Date/Time:  Wednesday June 06 2024 13:48:41 EST Ventricular Rate:  84 PR Interval:  146 QRS Duration:  82 QT Interval:  380 QTC Calculation: 449 R Axis:   -21  Text Interpretation: Normal sinus rhythm Minimal voltage criteria for LVH, may be normal variant ( R in aVL ) Nonspecific T wave abnormality When compared with ECG of 16-Nov-2023 09:34, No significant change was found Confirmed by Gerard Frederick ([id number]) on 06/06/2024 1:51:01 PM    Cardiac Studies & Procedures   ______________________________________________________________________________________________   STRESS TESTS  NM MYOCAR MULTI W/SPECT W 01/26/2023  Narrative   The study is normal. There are no perfusion defects. The study is low risk.   No ST deviation was noted.   LV perfusion is normal.   Left ventricular function is normal. Nuclear stress EF: 74%. The left ventricular ejection fraction is hyperdynamic (>65%). End diastolic cavity size is normal.   ECHOCARDIOGRAM  ECHOCARDIOGRAM COMPLETE 01/25/2023  Narrative ECHOCARDIOGRAM REPORT    Patient Name:   GURNEET MATARESE Date of Exam: 01/25/2023 Medical Rec #:  [id number]       Height:       65.0 in Accession #:    [id number]      Weight:       196.0 lb Date of Birth:  1971/04/30       BSA:          1.961 m Patient Age:    52 years        BP:           100/67 mmHg Patient Gender: F               HR:           74 bpm. Exam Location:  [facility]  Procedure: 2D Echo, Cardiac Doppler, Color Doppler and Strain Analysis  Indications:    Chest Pain  History:         Patient has no prior history of Echocardiogram examinations. Signs/Symptoms:Chest Pain, Syncope and Dizziness/Lightheadedness; Risk Factors:Hypertension.  Sonographer:    Naomie Reef Referring Phys: [id number] PRATIK D [facility]   Sonographer Comments: Global longitudinal strain was attempted. IMPRESSIONS   1. Left ventricular ejection fraction, by estimation, is 55 to 60%. The left ventricle has normal function. The left ventricle has no regional wall motion abnormalities. Left ventricular diastolic parameters were normal. The average left ventricular global longitudinal strain is -18.1 %. The global longitudinal strain is normal. 2. Right ventricular systolic function is normal. The right ventricular size is normal. There is normal pulmonary artery systolic pressure. 3. The mitral valve is normal in structure. Trivial mitral valve regurgitation. No evidence of mitral stenosis. 4. The tricuspid valve is abnormal. 5. The aortic valve is tricuspid. Aortic valve regurgitation is not visualized. No aortic  stenosis is present. 6. The inferior vena cava is normal in size with greater than 50% respiratory variability, suggesting right atrial pressure of 3 mmHg.  FINDINGS Left Ventricle: Left ventricular ejection fraction, by estimation, is 55 to 60%. The left ventricle has normal function. The left ventricle has no regional wall motion abnormalities. The average left ventricular global longitudinal strain is -18.1 %. The global longitudinal strain is normal. The left ventricular internal cavity size was normal in size. There is no left ventricular hypertrophy. Left ventricular diastolic parameters were normal.  Right Ventricle: The right ventricular size is normal. Right vetricular wall thickness was not well visualized. Right ventricular systolic function is normal. There is normal pulmonary artery systolic pressure. The tricuspid regurgitant velocity is 2.14 m/s, and with an assumed right atrial  pressure of 3 mmHg, the estimated right ventricular systolic pressure is 21.3 mmHg.  Left Atrium: Left atrial size was normal in size.  Right Atrium: Right atrial size was normal in size.  Pericardium: There is no evidence of pericardial effusion.  Mitral Valve: The mitral valve is normal in structure. Trivial mitral valve regurgitation. No evidence of mitral valve stenosis. MV peak gradient, 2.4 mmHg. The mean mitral valve gradient is 1.0 mmHg.  Tricuspid Valve: The tricuspid valve is abnormal. Tricuspid valve regurgitation is mild . No evidence of tricuspid stenosis.  Aortic Valve: The aortic valve is tricuspid. Aortic valve regurgitation is not visualized. No aortic stenosis is present. Aortic valve mean gradient measures 5.0 mmHg. Aortic valve peak gradient measures 9.7 mmHg. Aortic valve area, by VTI measures 2.72 cm.  Pulmonic Valve: The pulmonic valve was not well visualized. Pulmonic valve regurgitation is not visualized. No evidence of pulmonic stenosis.  Aorta: The aortic root is normal in size and structure.  Venous: The inferior vena cava is normal in size with greater than 50% respiratory variability, suggesting right atrial pressure of 3 mmHg.  IAS/Shunts: No atrial level shunt detected by color flow Doppler.   LEFT VENTRICLE PLAX 2D LVIDd:         4.00 cm   Diastology LVIDs:         2.80 cm   LV e' medial:    9.57 cm/s LV PW:         1.00 cm   LV E/e' medial:  8.3 LV IVS:        1.00 cm   LV e' lateral:   14.40 cm/s LVOT diam:     2.00 cm   LV E/e' lateral: 5.5 LV SV:         90 LV SV Index:   46        2D Longitudinal Strain LVOT Area:     3.14 cm  2D Strain GLS Avg:     -18.1 %   RIGHT VENTRICLE RV Basal diam:  3.00 cm RV Mid diam:    2.90 cm RV S prime:     11.60 cm/s TAPSE (M-mode): 2.3 cm  LEFT ATRIUM             Index        RIGHT ATRIUM           Index LA diam:        3.70 cm 1.89 cm/m   RA Area:     14.50 cm LA Vol (A2C):   39.0 ml 19.89 ml/m   RA Volume:   31.60 ml  16.11 ml/m LA Vol (A4C):   35.7 ml 18.20 ml/m LA Biplane Vol: 39.8  ml 20.29 ml/m AORTIC VALVE                    PULMONIC VALVE AV Area (Vmax):    2.40 cm     PV Vmax:       0.86 m/s AV Area (Vmean):   2.48 cm     PV Peak grad:  2.9 mmHg AV Area (VTI):     2.72 cm AV Vmax:           156.00 cm/s AV Vmean:          99.800 cm/s AV VTI:            0.329 m AV Peak Grad:      9.7 mmHg AV Mean Grad:      5.0 mmHg LVOT Vmax:         119.00 cm/s LVOT Vmean:        78.700 cm/s LVOT VTI:          0.285 m LVOT/AV VTI ratio: 0.87  AORTA Ao Root diam: 3.30 cm  MITRAL VALVE               TRICUSPID VALVE MV Area (PHT): 3.89 cm    TR Peak grad:   18.3 mmHg MV Area VTI:   4.05 cm    TR Vmax:        214.00 cm/s MV Peak grad:  2.4 mmHg MV Mean grad:  1.0 mmHg    SHUNTS MV Vmax:       0.78 m/s    Systemic VTI:  0.29 m MV Vmean:      47.0 cm/s   Systemic Diam: 2.00 cm MV Decel Time: 195 msec MV E velocity: 79.20 cm/s MV A velocity: 62.40 cm/s MV E/A ratio:  1.27  Dorn Ross MD Electronically signed by Dorn Ross MD Signature Date/Time: 01/25/2023/10:59:13 AM    Final    MONITORS  LONG TERM MONITOR (3-14 DAYS) 10/11/2023  Narrative   11 day monitor   Rare supraventricular ectopy in the form of isolated PACs, couplets. 10 runs of SVT, longest 14 beats.   Rare ventricular ectopy in the form of isolated PVCs.   Reported symptoms correlated with normal sinus rhythm   Patch Wear Time:  10 days and 14 hours (2025-05-05T23:26:29-0400 to 2025-05-16T13:47:47-0400)  Patient had a min HR of 47 bpm, max HR of 197 bpm, and avg HR of 74 bpm. Predominant underlying rhythm was Sinus Rhythm. 10 Supraventricular Tachycardia runs occurred, the run with the fastest interval lasting 6 beats with a max rate of 197 bpm, the longest lasting 14 beats with an avg rate of 141 bpm. Isolated SVEs were rare (<1.0%), SVE Couplets were rare (<1.0%), and no SVE Triplets were  present. Isolated VEs were rare (<1.0%), and no VE Couplets or VE Triplets were present.   CT SCANS  CT CORONARY MORPH W/CTA COR W/SCORE 08/25/2023  Addendum 10/25/2023  5:56 PM ADDENDUM REPORT: 10/25/2023 17:54  EXAM: OVER-READ INTERPRETATION  CT CHEST  The following report is an over-read performed by radiologist Dr. Suzen Dials of [facility] on 10/25/2023. This over-read does not include interpretation of cardiac or coronary anatomy or pathology. The coronary calcium  score/coronary CTA interpretation by the cardiologist is attached.  COMPARISON:  January 18, 2020  FINDINGS: Cardiovascular: There are no significant extracardiac vascular findings.  Mediastinum/Nodes: There are no enlarged lymph nodes within the visualized mediastinum.  Lungs/Pleura: There is no pleural effusion. The visualized lungs appear clear.  Upper  abdomen: No significant findings in the visualized upper abdomen.  Musculoskeletal/Chest wall: No chest wall mass or suspicious osseous findings within the visualized chest.  IMPRESSION: No significant extracardiac findings within the visualized chest.   Electronically Signed By: Suzen Dials M.D. On: 10/25/2023 17:54  Narrative CLINICAL DATA:  Chest pain  EXAM: Cardiac/Coronary  CTA  TECHNIQUE: The patient was scanned on a Siemens Somatom go.Top scanner.  : A retrospective scan was triggered in the ascending thoracic aorta. Axial non-contrast 3 mm slices were carried out through the heart. The data set was analyzed on a dedicated work station and scored using the Agatson method. Gantry rotation speed was 330 msecs and collimation was .6 mm. 100mg  of metoprolol  and 0.8 mg of sl NTG was given. The 3D data set was reconstructed in 5% intervals of the 60-95 % of the R-R cycle. Diastolic phases were analyzed on a dedicated work station using MPR, MIP and VRT modes. The patient received 100 cc of  contrast.  FINDINGS: Aorta: Mild ascending aorta dilation, maximal diameter 40 mm. No calcifications. No dissection.  Aortic Valve:  Trileaflet.  No calcifications.  Coronary Arteries:  Normal coronary origin.  Right dominance.  RCA is a dominant artery. There is no plaque.  Left main gives rise to LAD and LCX arteries. LM has no disease.  LAD has no plaque.  LCX is a non-dominant artery.  There is no plaque.  Other findings:  Normal pulmonary vein drainage into the left atrium.  Normal left atrial appendage without a thrombus.  Normal size of the pulmonary artery.  IMPRESSION: 1. Coronary calcium  score of 0.  2. Normal coronary origin with right dominance.  3. No evidence of CAD.  4. CAD-RADS 0. Consider non-atherosclerotic causes of chest pain.  5. Mild ascending aorta dilation, 40 mm in diameter.  Electronically Signed: By: Redell Cave M.D. On: 08/25/2023 17:03     ______________________________________________________________________________________________      Risk Assessment/Calculations  The 10-year ASCVD risk score (Arnett DK, et al., 2019) is: 1.5%          Physical Exam VS:  BP 116/78 (BP Location: Left Arm, Patient Position: Sitting, Cuff Size: Large)   Pulse 84   Ht 5' 5 (1.651 m)   Wt 110.1 kg   SpO2 98%   BMI 40.40 kg/m        Wt Readings from Last 3 Encounters:  06/06/24 110.1 kg  05/17/24 109 kg  04/25/24 109.3 kg    GEN: Well nourished, well developed in no acute distress NECK: No JVD; No carotid bruits CARDIAC: RRR, no murmurs, rubs, gallops RESPIRATORY:  Clear to auscultation without rales, wheezing or rhonchi  ABDOMEN: Soft, non-tender, non-distended EXTREMITIES:  No edema; No deformity   ASSESSMENT AND PLAN Palpitations EKG NSR today with no significant changes from last. Previously worn a ZIO monitor which revealed underlying rhythm was sinus. Pt continues to decline beta-blocker therapy at this time due to  concerns for side effects. Continue with stress reducing techniques. Mild ascending aortic dilatation Mild ascending aortic dilation measuring 40mm as incidental finding on coronary CTA. Will continue with annual routine surveillance. Obesity Pt continues to experience weight gain despite consistent diet and activity levels. Pt previously tried HRT without improvement in symptoms. Last LDL 11/25 114. Last A1c 11/25 5.8%. ASCVD risk score 1.5%. Cortisol level ordered today- risk factors include hypertension, metabolic syndrome, obesity, and hyperlipidemia.  Primary hypertension BP 116/78 in office today. Hold amlodipine  5mg  daily. Continue with lisinopril -hydrochlorothiazide  20-25  mg. Continue to monitor BP at home. 5.   Recurrent UTI Pt has had recurring UTIs and recent hospitalization for sepsis. Referral to Urology placed.  6.   Anxiety   Pt has been struggling with chest pain and palpitations secondary to elevated stress levels. Pt has been unable to fill her clonazepam  0.5 mg BID PRN since November due to a pharmacy change and difficulty getting a hold of her PCP. Message sent to Alphonsa, MD regarding refill.     Dispo: Follow-up in 6 weeks, sooner if concerns arise.   Signed, Bernarda Molt, RN   "

## 2024-06-06 NOTE — Patient Instructions (Signed)
 Medication Instructions:  Your physician recommends that you continue on your current medications as directed. Please refer to the Current Medication list given to you today.   *If you need a refill on your cardiac medications before your next appointment, please call your pharmacy*  Lab Work: Your provider would like for you to return in ASAP to have the following labs drawn: CORTISOL.   Please go to [facility] [address] ([facility] [address], [place] [address] You do not need an appointment.  They are open from 8 am- 4:30 pm.  Lunch from 1:00 pm- 2:00 pm You do need to be fasting.   You may also go to one of the following [facility]:  [address] [place], [place] [address] Phone: [phone] Lab hours: Mon-Fri 8 am- 5 pm    Lunch 12 pm- 1 pm  [address] [place],  [place]  [address]  [place]  Phone: [phone] Lab hours: 7 am- 4 pm Lunch 12 pm-1 pm   [address] [place],  [place]  [address]  [place]  Phone: [phone] Lab hours: Mon-Fri 8 am- 5 pm    Lunch 12 pm- 1 pm  If you have labs (blood work) drawn today and your tests are completely normal, you will receive your results only by: MyChart Message (if you have MyChart) OR A paper copy in the mail If you have any lab test that is abnormal or we need to change your treatment, we will call you to review the results.  Testing/Procedures: No test ordered today   Follow-Up: At [facility], you and your health needs are our priority.  As part of our continuing mission to provide you with exceptional heart care, our providers are all part of one team.  This team includes your primary Cardiologist (physician) and Advanced Practice Providers or APPs (Physician Assistants and Nurse Practitioners) who all work together to provide you with the care you need, when you need it.  Your next appointment:   6 week(s)  Provider:   Tylene Lunch, NP    We recommend signing up for the patient portal  called MyChart.  Sign up information is provided on this After Visit Summary.  MyChart is used to connect with patients for Virtual Visits (Telemedicine).  Patients are able to view lab/test results, encounter notes, upcoming appointments, etc.  Non-urgent messages can be sent to your provider as well.   To learn more about what you can do with MyChart, go to [email or web address].

## 2024-06-06 NOTE — Telephone Encounter (Signed)
 Message was received from call center that Theresa [name] called in she has not heard anything from [facility] or urology referrals.

## 2024-07-25 ENCOUNTER — Ambulatory Visit: Admitting: Cardiology

## 2024-10-05 ENCOUNTER — Ambulatory Visit: Payer: Self-pay | Admitting: Nurse Practitioner
